# Patient Record
Sex: Female | Born: 1960
Health system: Southern US, Community
[De-identification: ages and names within clinical notes are randomized; demographics above are authoritative.]

## PROBLEM LIST (undated history)

## (undated) DIAGNOSIS — A150 Tuberculosis of lung: Secondary | ICD-10-CM

## (undated) DIAGNOSIS — R9389 Abnormal findings on diagnostic imaging of other specified body structures: Secondary | ICD-10-CM

## (undated) DIAGNOSIS — M5126 Other intervertebral disc displacement, lumbar region: Secondary | ICD-10-CM

## (undated) DIAGNOSIS — M810 Age-related osteoporosis without current pathological fracture: Secondary | ICD-10-CM

## (undated) DIAGNOSIS — K759 Inflammatory liver disease, unspecified: Secondary | ICD-10-CM

## (undated) DIAGNOSIS — G629 Polyneuropathy, unspecified: Secondary | ICD-10-CM

## (undated) DIAGNOSIS — N189 Chronic kidney disease, unspecified: Secondary | ICD-10-CM

## (undated) DIAGNOSIS — J40 Bronchitis, not specified as acute or chronic: Secondary | ICD-10-CM

## (undated) DIAGNOSIS — M5430 Sciatica, unspecified side: Secondary | ICD-10-CM

## (undated) DIAGNOSIS — B20 Human immunodeficiency virus [HIV] disease: Secondary | ICD-10-CM

## (undated) DIAGNOSIS — D071 Carcinoma in situ of vulva: Secondary | ICD-10-CM

## (undated) DIAGNOSIS — Z87448 Personal history of other diseases of urinary system: Secondary | ICD-10-CM

## (undated) DIAGNOSIS — I10 Essential (primary) hypertension: Secondary | ICD-10-CM

## (undated) DIAGNOSIS — K922 Gastrointestinal hemorrhage, unspecified: Secondary | ICD-10-CM

## (undated) DIAGNOSIS — IMO0002 Reserved for concepts with insufficient information to code with codable children: Secondary | ICD-10-CM

## (undated) DIAGNOSIS — F1910 Other psychoactive substance abuse, uncomplicated: Secondary | ICD-10-CM

## (undated) DIAGNOSIS — C4492 Squamous cell carcinoma of skin, unspecified: Secondary | ICD-10-CM

## (undated) DIAGNOSIS — M1990 Unspecified osteoarthritis, unspecified site: Secondary | ICD-10-CM

## (undated) HISTORY — DX: Reserved for concepts with insufficient information to code with codable children: IMO0002

## (undated) HISTORY — DX: Age-related osteoporosis without current pathological fracture: M81.0

## (undated) HISTORY — DX: Squamous cell carcinoma of skin, unspecified: C44.92

## (undated) HISTORY — DX: Gastrointestinal hemorrhage, unspecified: K92.2

## (undated) HISTORY — DX: Abnormal findings on diagnostic imaging of other specified body structures: R93.89

## (undated) HISTORY — DX: Tuberculosis of lung: A15.0

## (undated) HISTORY — PX: CHOLECYSTECTOMY: SHX55

## (undated) HISTORY — DX: Other psychoactive substance abuse, uncomplicated: F19.10

## (undated) HISTORY — PX: APPENDECTOMY: SHX54

## (undated) HISTORY — PX: BREAST EXCISIONAL BIOPSY: SUR124

---
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
## 1983-03-10 DIAGNOSIS — B20 Human immunodeficiency virus [HIV] disease: Secondary | ICD-10-CM

## 1983-03-10 DIAGNOSIS — Z21 Asymptomatic human immunodeficiency virus [HIV] infection status: Secondary | ICD-10-CM

## 1983-03-10 HISTORY — DX: Human immunodeficiency virus (HIV) disease: B20

## 1983-03-10 HISTORY — PX: ECTOPIC PREGNANCY SURGERY: SHX613

## 1983-03-10 HISTORY — DX: Asymptomatic human immunodeficiency virus (hiv) infection status: Z21

## 1988-03-09 HISTORY — PX: SALIVARY GLAND SURGERY: SHX768

## 1991-03-10 DIAGNOSIS — A150 Tuberculosis of lung: Secondary | ICD-10-CM

## 1991-03-10 HISTORY — DX: Tuberculosis of lung: A15.0

## 2008-03-09 DIAGNOSIS — M810 Age-related osteoporosis without current pathological fracture: Secondary | ICD-10-CM | POA: Insufficient documentation

## 2010-01-07 LAB — HM PAP SMEAR: HM Pap smear: NORMAL

## 2010-07-01 ENCOUNTER — Inpatient Hospital Stay (INDEPENDENT_AMBULATORY_CARE_PROVIDER_SITE_OTHER)
Admission: RE | Admit: 2010-07-01 | Discharge: 2010-07-01 | Disposition: A | Discharge status: Home / Self Care | Payer: Medicare Other | Source: Ambulatory Visit | Attending: Emergency Medicine | Admitting: Emergency Medicine

## 2010-07-01 DIAGNOSIS — N390 Urinary tract infection, site not specified: Secondary | ICD-10-CM

## 2010-07-01 LAB — POCT URINALYSIS DIP (DEVICE)
Glucose, UA: NEGATIVE mg/dL
Nitrite: POSITIVE — AB
Urobilinogen, UA: 0.2 mg/dL (ref 0.0–1.0)

## 2011-02-23 LAB — BASIC METABOLIC PANEL
Glucose: 92 mg/dL
Potassium: 4.2 mmol/L (ref 3.4–5.3)
Sodium: 136 mmol/L — AB (ref 137–147)

## 2011-02-23 LAB — LIPID PANEL
Cholesterol: 210 mg/dL — AB (ref 0–200)
HDL: 43 mg/dL (ref 35–70)
LDL Cholesterol: 128 mg/dL
Triglycerides: 193 mg/dL — AB (ref 40–160)

## 2011-02-23 LAB — HEPATIC FUNCTION PANEL: AST: 13 U/L (ref 13–35)

## 2011-02-23 LAB — CBC AND DIFFERENTIAL
HCT: 40 % (ref 36–46)
Hemoglobin: 13 g/dL (ref 12.0–16.0)

## 2011-07-10 ENCOUNTER — Telehealth (HOSPITAL_COMMUNITY): Payer: Self-pay | Admitting: *Deleted

## 2011-07-10 ENCOUNTER — Emergency Department (INDEPENDENT_AMBULATORY_CARE_PROVIDER_SITE_OTHER)
Admission: EM | Admit: 2011-07-10 | Discharge: 2011-07-10 | Disposition: A | Discharge status: Home / Self Care | Payer: Medicare Other | Source: Home / Self Care | Attending: Family Medicine | Admitting: Family Medicine

## 2011-07-10 ENCOUNTER — Encounter (HOSPITAL_COMMUNITY): Payer: Self-pay

## 2011-07-10 DIAGNOSIS — M5430 Sciatica, unspecified side: Secondary | ICD-10-CM

## 2011-07-10 DIAGNOSIS — M5432 Sciatica, left side: Secondary | ICD-10-CM

## 2011-07-10 HISTORY — DX: Human immunodeficiency virus (HIV) disease: B20

## 2011-07-10 HISTORY — DX: Sciatica, unspecified side: M54.30

## 2011-07-10 HISTORY — DX: Other intervertebral disc displacement, lumbar region: M51.26

## 2011-07-10 MED ORDER — KETOROLAC TROMETHAMINE 30 MG/ML IJ SOLN
30.0000 mg | Freq: Once | INTRAMUSCULAR | Status: AC
  Administered 2011-07-10: 30 mg via INTRAMUSCULAR

## 2011-07-10 MED ORDER — METHYLPREDNISOLONE 4 MG PO KIT: PACK | ORAL | Status: AC

## 2011-07-10 MED ORDER — KETOROLAC TROMETHAMINE 30 MG/ML IJ SOLN
INTRAMUSCULAR | Status: AC
  Filled 2011-07-10: qty 1

## 2011-07-10 MED ORDER — METHYLPREDNISOLONE ACETATE 40 MG/ML IJ SUSP
80.0000 mg | Freq: Once | INTRAMUSCULAR | Status: AC
  Administered 2011-07-10: 80 mg via INTRAMUSCULAR

## 2011-07-10 MED ORDER — METHYLPREDNISOLONE ACETATE 80 MG/ML IJ SUSP
INTRAMUSCULAR | Status: AC
Start: 2011-07-10 — End: 2011-07-10
  Filled 2011-07-10: qty 1

## 2011-07-10 MED ORDER — METAXALONE 800 MG PO TABS: 800.0000 mg | ORAL_TABLET | Freq: Three times a day (TID) | ORAL | Status: AC

## 2011-07-10 NOTE — Discharge Instructions (Signed)
Heat to back, use medicine as prescribed, see orthopedist if further problems.

## 2011-07-10 NOTE — ED Notes (Signed)
Pt. called and said she was seen this morning by Dr. Artis Flock. She said her insurance would not cover the Skelaxin and needs something for her pain.  Her insurance for her medicine is [employer] Part D. I called [facility]ist on [address] @ [phone].   He said they would cover Tizanadine  4 or 8 mg. I asked Dr. Artis Flock and he changed order to Tizanadine 4 mg TID # 21. Pharmacist notified of change. Vassie Moselle 07/10/2011

## 2011-07-10 NOTE — ED Provider Notes (Signed)
History     CSN: [id number]  Arrival date & time 07/10/11  4098   First MD Initiated Contact with Patient 07/10/11 1006      Chief Complaint  Patient presents with  . Sciatica    (Consider location/radiation/quality/duration/timing/severity/associated sxs/prior treatment) Patient is a 51 y.o. female presenting with back pain. The history is provided by the patient.  Back Pain  This is a chronic problem. The current episode started yesterday. 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 The pain is present in the lumbar spine. The quality of the pain is described as shooting. The pain radiates to the left thigh. The pain is moderate. Pertinent negatives include no abdominal pain, no bowel incontinence, no bladder incontinence and no dysuria.    Past Medical History  Diagnosis Date  . HIV (human immunodeficiency virus infection)   . Sciatica   . Ruptured lumbar disc     Past Surgical History  Procedure Date  . Cholecystectomy   . Appendectomy   . Ectopic pregnancy surgery   . Salivary gland surgery     No family history on file.  History  Substance Use Topics  . Smoking status: Never Smoker   . Smokeless tobacco: Not on file  . Alcohol Use: No    OB History    Grav Para Term Preterm Abortions TAB SAB Ect Mult Living                  Review of Systems  Constitutional: Negative.   Gastrointestinal: Negative.  Negative for abdominal pain and bowel incontinence.  Genitourinary: Negative for bladder incontinence and dysuria.  Musculoskeletal: Positive for back pain.    Allergies  Bactrim and Doxycycline  Home Medications   Current Outpatient Rx  Name Route Sig Dispense Refill  . MEPRON PO Oral Take by mouth.    . EFAVIRENZ-EMTRICITAB-TENOFOVIR 600-200-300 MG PO TABS Oral Take 1 tablet by mouth at bedtime.    Marland Kitchen NAPROXEN 500 MG PO TABS Oral Take 500 mg by mouth 2 (two) times daily with a meal.    .  METAXALONE 800 MG PO TABS Oral Take 1 tablet (800 mg total) by mouth 3 (three) times daily. As muscle relaxer. 21 tablet 0  . METHYLPREDNISOLONE 4 MG PO KIT  follow package directions, start on sat , take until finished. 21 tablet 0    BP 125/78  Pulse 60  Temp(Src) 97.9 F (36.6 C) (Oral)  Resp 16  SpO2 97%  Physical Exam  Nursing note and vitals reviewed. Constitutional: She is oriented to person, place, and time. She appears well-developed and well-nourished.  Abdominal: Soft. Bowel sounds are normal.  Musculoskeletal: She exhibits tenderness.       Back:  Neurological: She is alert and oriented to person, place, and time. She has normal reflexes.  Skin: Skin is warm and dry.    ED Course  Procedures (including critical care time)  Labs Reviewed - No data to display No results found.   1. Chronic sciatica, left       MDM          Linna Hoff, MD 07/10/11 [phone]

## 2011-07-10 NOTE — ED Notes (Signed)
C/o sciatica since yesterday.  Has pain in low back, buttocks and down both legs.  States worse on lt than rt.

## 2011-07-13 ENCOUNTER — Telehealth (HOSPITAL_COMMUNITY): Payer: Self-pay | Admitting: *Deleted

## 2011-07-13 NOTE — ED Notes (Signed)
Pt. called on VM on 5/3 @ 1547 but did not leave a message. 5/6 I called pt. back and left a message. Pt. called right back and said it was already taken cared of. It was about a Rx. Courtney Hamilton 07/13/2011

## 2011-07-21 ENCOUNTER — Ambulatory Visit (INDEPENDENT_AMBULATORY_CARE_PROVIDER_SITE_OTHER): Payer: Medicare Other | Admitting: Sports Medicine

## 2011-07-21 ENCOUNTER — Encounter: Payer: Self-pay | Admitting: Sports Medicine

## 2011-07-21 VITALS — BP 127/85 | HR 89 | Temp 97.5°F | Ht 66.0 in | Wt 155.3 lb

## 2011-07-21 DIAGNOSIS — G479 Sleep disorder, unspecified: Secondary | ICD-10-CM

## 2011-07-21 DIAGNOSIS — M549 Dorsalgia, unspecified: Secondary | ICD-10-CM | POA: Insufficient documentation

## 2011-07-21 DIAGNOSIS — O98519 Other viral diseases complicating pregnancy, unspecified trimester: Secondary | ICD-10-CM

## 2011-07-21 DIAGNOSIS — Z21 Asymptomatic human immunodeficiency virus [HIV] infection status: Secondary | ICD-10-CM

## 2011-07-21 DIAGNOSIS — M810 Age-related osteoporosis without current pathological fracture: Secondary | ICD-10-CM

## 2011-07-21 DIAGNOSIS — G8929 Other chronic pain: Secondary | ICD-10-CM | POA: Insufficient documentation

## 2011-07-21 MED ORDER — HYDROCODONE-ACETAMINOPHEN 5-325 MG PO TABS: 1.0000 | ORAL_TABLET | Freq: Three times a day (TID) | ORAL | Status: DC | PRN

## 2011-07-21 NOTE — Patient Instructions (Signed)
It was nice to meet you today.     Today we discussed:   Your HIV - I will refer you to our ID clinic in town.  We will be in touch with you regarding an appointment   Your BACK PAIN:  I have written for you to have 60 tablets available for the month.  We will not be able to write more than this for the next 30 days.  You stated you only need them on your bad days and then only up to twice a day.  We would like to refer you to pain management and physical therapy but will need to obtain records prior to this.  Please plan to return to see me in 3-4 weeks.  If you need anything prior to seeing me please call the clinic.

## 2011-07-27 NOTE — Progress Notes (Signed)
Patient ID: Paizlee Kinder, female   DOB: 1960-07-25, 51 y.o.   MRN: [id number] HPI:  Avonlea Sima is a 51 y.o. female presenting today to establish care.   Pt has recently moved to [place] from [place].  Past medical problems include chronic back pain and HIV.  She has been followed by ID and is currently on anti-retrovirals.  Per her report her CD4 count was 0, 3 years ago but has now increased to 136 since starting Atripla.   She has recently stopped smoking 1 month ago. Pt does report chronic back pain for that is requires Opioids prn. Does report disordered sleeping that is long standing; sleep maintenance issues - agreed to defer to next visit  ROS Denies chest pain, fevers, chills, cough, falls, weakness or weightloss.  HISTORY Medications Reviewed & Updated, see associated section Medical Hx Reviewed: Significant for HIV, Backpain, osteoprosis Family History Reviewed: Yes  and see associated section Social History Reviewed:  Significant for recent move to [place], quit smoking 1 month ago.  PE: GENERAL:  Adult AA female.  Examined in [facility].  In no discomfort; norespiratory distress.   PSYCH: Alert and appropriately interactive; Insight:Good   H&N: AT/[place], MMM, no scleral icterus, EOMi, no oral lesions THORAX: HEART: RRR, S1/S2 heard, no murmur LUNGS: CTA B, no wheezes, no crackles ABDOMEN:  +BS, soft, non-tender, no rigidity, no guarding, no masses/organomegaly EXTREMITIES: Moves all 4 extremities spontaneously, warm well perfused, NO edema, bilateral DP and PT pulses 2/4.   >NEURO: B LE DTRs 2+4 with strength 5/5 B LE myotomes >MSK/Osteopathic: restricted hip flexion, L anterior innominate.

## 2011-07-31 ENCOUNTER — Telehealth: Payer: Self-pay | Admitting: Sports Medicine

## 2011-07-31 NOTE — Telephone Encounter (Signed)
Patient was in about a week ago and says she discussed not sleeping well.  That problem is continuing and she is hoping for a medication to be sent in to her pharmacy.

## 2011-08-03 DIAGNOSIS — Z21 Asymptomatic human immunodeficiency virus [HIV] infection status: Secondary | ICD-10-CM | POA: Insufficient documentation

## 2011-08-03 DIAGNOSIS — G479 Sleep disorder, unspecified: Secondary | ICD-10-CM | POA: Insufficient documentation

## 2011-08-03 DIAGNOSIS — B20 Human immunodeficiency virus [HIV] disease: Secondary | ICD-10-CM | POA: Insufficient documentation

## 2011-08-05 NOTE — Telephone Encounter (Signed)
Patient is calling back and has not heard back from MD yet.  Having trouble staying asleep and waking up tired like she hasn't slept.  She is hoping for meds to help her sleep.

## 2011-08-06 ENCOUNTER — Encounter: Payer: Self-pay | Admitting: Family Medicine

## 2011-08-06 ENCOUNTER — Ambulatory Visit (INDEPENDENT_AMBULATORY_CARE_PROVIDER_SITE_OTHER): Payer: Medicare Other | Admitting: Family Medicine

## 2011-08-06 VITALS — BP 137/86 | HR 65 | Temp 97.9°F | Ht 66.0 in | Wt 157.8 lb

## 2011-08-06 DIAGNOSIS — N390 Urinary tract infection, site not specified: Secondary | ICD-10-CM

## 2011-08-06 DIAGNOSIS — R300 Dysuria: Secondary | ICD-10-CM

## 2011-08-06 HISTORY — DX: Urinary tract infection, site not specified: N39.0

## 2011-08-06 LAB — POCT URINALYSIS DIPSTICK
Nitrite, UA: POSITIVE
Protein, UA: NEGATIVE
Spec Grav, UA: 1.01
Urobilinogen, UA: 0.2

## 2011-08-06 LAB — POCT UA - MICROSCOPIC ONLY

## 2011-08-06 MED ORDER — CEFTRIAXONE SODIUM 1 G IJ SOLR
1.0000 g | Freq: Once | INTRAMUSCULAR | Status: AC
  Administered 2011-08-06: 1 g via INTRAMUSCULAR

## 2011-08-06 MED ORDER — NAPROXEN 500 MG PO TABS: 500.0000 mg | ORAL_TABLET | Freq: Two times a day (BID) | ORAL | Status: DC

## 2011-08-06 MED ORDER — TRAMADOL HCL 50 MG PO TABS: 50.0000 mg | ORAL_TABLET | Freq: Three times a day (TID) | ORAL | Status: DC | PRN

## 2011-08-06 MED ORDER — CEPHALEXIN 500 MG PO CAPS: 500.0000 mg | ORAL_CAPSULE | Freq: Two times a day (BID) | ORAL | Status: AC

## 2011-08-06 NOTE — Progress Notes (Signed)
Patient ID: Kaelea Gathright, female   DOB: February 02, 1961, 51 y.o.   MRN: [id number]   SUBJECTIVE: Lyndall Bellot is a 51 y.o. female who complains of urinary frequency, urgency and dysuria x 5 days, without flank pain, fever, chills, or abnormal vaginal discharge or bleeding.   OBJECTIVE: Appears well, in no apparent distress.  Vital signs are normal. The abdomen is soft without tenderness, guarding, mass, rebound or organomegaly. No CVA tenderness or inguinal adenopathy noted. Urine dipstick shows positive for WBC's, positive for RBC's, positive for nitrates and positive for leukocytes.  Micro exam: 2-5 WBC's per HPF, 2-5 RBC's per HPF and 3+ bacteria.   ASSESSMENT: UTI uncomplicated without evidence of pyelonephritis  PLAN: Treatment per orders - also push fluids, may use Pyridium OTC prn. Call or return to clinic prn if these symptoms worsen or fail to improve as anticipated.

## 2011-08-06 NOTE — Telephone Encounter (Signed)
Discussed lack of sleep with pt - reports issues with sleep maintenance.  This is to be discussed further at her next visit with me as it is a long standing issue.  She also reports acute resp illness that has made sleeping worse.  Encouraged her to make an appointment in the clinic to be evaluated for her acute illness.    Discussed avoidance of stimulating activities prior to bed time and suggested moving her evening walk to a morning walk as this is something new that coincides with her sleeplessness.

## 2011-08-06 NOTE — Patient Instructions (Addendum)
Good to see you. You do have a urinary tract infection. I'm giving you a shot of antibiotic today and going to give you 7 days worth of antibiotics. I also am giving you to tramadol. Should probably come back in one week to see your physician or myself again to make sure you're getting better.  Urinary Tract Infection Infections of the urinary tract can start in several places. A bladder infection (cystitis), a kidney infection (pyelonephritis), and a prostate infection (prostatitis) are different types of urinary tract infections (UTIs). They usually get better if treated with medicines (antibiotics) that kill germs. Take all the medicine until it is gone. You or your child may feel better in a few days, but TAKE ALL MEDICINE or the infection may not respond and may become more difficult to treat. HOME CARE INSTRUCTIONS   Drink enough water and fluids to keep the urine clear or pale yellow. Cranberry juice is especially recommended, in addition to large amounts of water.   Avoid caffeine, tea, and carbonated beverages. They tend to irritate the bladder.   Alcohol may irritate the prostate.   Only take over-the-counter or prescription medicines for pain, discomfort, or fever as directed by your caregiver.  To prevent further infections:  Empty the bladder often. Avoid holding urine for long periods of time.   After a bowel movement, women should cleanse from front to back. Use each tissue only once.   Empty the bladder before and after sexual intercourse.  FINDING OUT THE RESULTS OF YOUR TEST Not all test results are available during your visit. If your or your child's test results are not back during the visit, make an appointment with your caregiver to find out the results. Do not assume everything is normal if you have not heard from your caregiver or the medical facility. It is important for you to follow up on all test results. SEEK MEDICAL CARE IF:   There is back pain.   Your  baby is older than 3 months with a rectal temperature of 100.5 F (38.1 C) or higher for more than 1 day.   Your or your child's problems (symptoms) are no better in 3 days. Return sooner if you or your child is getting worse.  SEEK IMMEDIATE MEDICAL CARE IF:   There is severe back pain or lower abdominal pain.   You or your child develops chills.   You have a fever.   Your baby is older than 3 months with a rectal temperature of 102 F (38.9 C) or higher.   Your baby is 16 months old or younger with a rectal temperature of 100.4 F (38 C) or higher.   There is nausea or vomiting.   There is continued burning or discomfort with urination.  MAKE SURE YOU:   Understand these instructions.   Will watch your condition.   Will get help right away if you are not doing well or get worse.  Document Released: 12/03/2004 Document Revised: 02/12/2011 Document Reviewed: 07/08/2006 [facility] Patient Information 2012 [place], [place].

## 2011-08-06 NOTE — Assessment & Plan Note (Signed)
Patient has positive leukocyte esterase, nitrates, and blood. Patient clinically appears to have a UTI and potentially early pyelonephritis. We'll do ceftriaxone x1 today Keflex twice a day for the next 7 days. Patient given tramadol and naproxen for pain. Asked patient to followup the next day she declined will be following up in the next week. Patient is a red flags and when to seek medical attention.

## 2011-08-06 NOTE — Assessment & Plan Note (Addendum)
On Atripla, atovaquone and clotrimazole.  Currently on anti-retroviral regimen she was on in [place].  Will refer to ID clinic for continuation of care.

## 2011-08-06 NOTE — Assessment & Plan Note (Signed)
Reports difficulty with sleep maintenance.  Deferred further discuss to next appointment as pt indicates this was of lower priority than her back pain.

## 2011-08-09 LAB — URINE CULTURE

## 2011-08-10 ENCOUNTER — Telehealth: Payer: Self-pay

## 2011-08-10 NOTE — Telephone Encounter (Signed)
Left message to call for new patient intake.  I will also need proof of positivity prior to visit.    Laurell Josephs, RN

## 2011-08-11 ENCOUNTER — Encounter: Payer: Self-pay | Admitting: Sports Medicine

## 2011-08-11 ENCOUNTER — Telehealth: Payer: Self-pay | Admitting: *Deleted

## 2011-08-11 NOTE — Assessment & Plan Note (Signed)
Will await records

## 2011-08-11 NOTE — Assessment & Plan Note (Addendum)
Pt provided prn norco.  Awaiting further records from [place].  Pt will likely benefit from PT.  If chronic pain meds required will refer to Pain Management

## 2011-08-11 NOTE — Telephone Encounter (Signed)
She left a message on voicemail that she was returning our call. When I called her there was no answer & she did not have a machine kick in to leave a message for her. To Tomasita Morrow, RN

## 2011-08-11 NOTE — Assessment & Plan Note (Signed)
>>  ASSESSMENT AND PLAN FOR BACK PAIN WRITTEN ON 08/11/2011 12:36 AM BY RIGBY, Davy Westmoreland D, DO  Pt provided prn norco.  Awaiting further records from [place].  Pt will likely benefit from PT.  If chronic pain meds required will refer to Pain Management

## 2011-08-19 ENCOUNTER — Telehealth: Payer: Self-pay | Admitting: Sports Medicine

## 2011-08-19 NOTE — Telephone Encounter (Signed)
Pt has appt on 7/11 and is needing refill on her hydrocodone until appt. [facility] [address]

## 2011-08-19 NOTE — Telephone Encounter (Signed)
Will route request to Dr. Cherlynn June clinic this afternoon.  Gaylene Brooks, RN

## 2011-08-20 NOTE — Telephone Encounter (Signed)
Pt will need to be seen in clinic prior to refill on pain medications.  It was made clear that she needed to return to clinic prior to running out of meds to reasses her on going issues.  I realize my clinics are full but she will need to be seen by another provider on the [facility] team or wait until her appointment with me.

## 2011-08-21 NOTE — Telephone Encounter (Signed)
Called patient and left message to call our office back.  Kitrina Maurin Ann, RN  

## 2011-08-21 NOTE — Telephone Encounter (Signed)
Patient called back.  Informed of info from Dr. Marline Backbone office visit before med can be refilled.  Patient is going out of town and unable to be seen sooner.  Will follow-up with Dr. Berline Chough at appt on 09/17/11.  Gaylene Brooks, RN

## 2011-09-07 ENCOUNTER — Telehealth: Payer: Self-pay

## 2011-09-07 NOTE — Telephone Encounter (Signed)
Pt left voicemail message last Friday, stating she is out of town and will return on September 17, 2011 and would like to schedule appointment on that date.  Return call made to patient today. No available appointments on September 17, 2011. Next available after the 40 th is September 21, 2011 . Advised pt to call back to schedule appointment and will also need release signed proving HIV status since we do not have any labs in our system. Pt was referred by [facility] physician.   Laurell Josephs, RN

## 2011-09-17 ENCOUNTER — Ambulatory Visit (INDEPENDENT_AMBULATORY_CARE_PROVIDER_SITE_OTHER): Payer: Medicare Other | Admitting: Sports Medicine

## 2011-09-17 ENCOUNTER — Encounter: Payer: Self-pay | Admitting: Sports Medicine

## 2011-09-17 VITALS — BP 110/76 | HR 78 | Temp 98.6°F | Ht 66.0 in | Wt 159.0 lb

## 2011-09-17 DIAGNOSIS — M549 Dorsalgia, unspecified: Secondary | ICD-10-CM

## 2011-09-17 DIAGNOSIS — G5701 Lesion of sciatic nerve, right lower limb: Secondary | ICD-10-CM | POA: Insufficient documentation

## 2011-09-17 DIAGNOSIS — G5700 Lesion of sciatic nerve, unspecified lower limb: Secondary | ICD-10-CM

## 2011-09-17 DIAGNOSIS — Z21 Asymptomatic human immunodeficiency virus [HIV] infection status: Secondary | ICD-10-CM

## 2011-09-17 MED ORDER — HYDROCODONE-ACETAMINOPHEN 5-325 MG PO TABS: 1.0000 | ORAL_TABLET | Freq: Three times a day (TID) | ORAL | Status: DC | PRN

## 2011-09-17 MED ORDER — NORTRIPTYLINE HCL 25 MG PO CAPS: 25.0000 mg | ORAL_CAPSULE | Freq: Every day | ORAL | Status: DC

## 2011-09-17 MED ORDER — BACLOFEN 10 MG PO TABS: 10.0000 mg | ORAL_TABLET | Freq: Three times a day (TID) | ORAL | Status: DC | PRN

## 2011-09-17 NOTE — Assessment & Plan Note (Signed)
Patient reports worsening of right gluteal pain since her travel to [place] recently.  Instructed on exercises as well as heat and ice  Tennis ball technique reviewed patient with hesitancy towards this modality.  Patient provided with muscle relaxer.

## 2011-09-17 NOTE — Progress Notes (Signed)
  [facility]  Patient name: Geovanna Simko MRN [id number]  Date of birth: July 04, 1960  CC & HPI  Mahiya Spearman is a 51 y.o. female presenting today for follow-up of Low back pain  Has not been able to establish with HIV clinic.  Has been taking her medications and has plenty at this time.  Has been out of pain meds since 30 days after fill of 60 pills.  Reports a significant difference in her pain control and activities of daily living  Patient does report a new gluteal pain on right.  This has started over the past 2 weeks.  Tightness and burning, Nonradiating.  Worsened after her trainer at home for [place] recently  Chronic Pain Follow-Up Assessment Chronic Pain Diagnosis: Chronic Low Back pain  Pain Scale rating (0-10) in PAST 24 Hours: BEST 3/10  WORST 10/10  Using Likert scale (0 = Does not interfere; 10 - completely interferes) describe how - during the last 24 hours - pain has interfered with pt's: General Activity 5/10  Mood 0/10  Ability to work (in or out of home) 10/10 given difficulty to maintain job due to lability of her pain severity  Interactions with other people 4/10  Sleep 0/10  Enjoyment of Life 7/10   ROS  No falls, no fevers, no chills, no dysuria  Pertinent History Reviewed  Medical & Surgical Hx:  Reviewed: Significant for HIV, chronic DDD Medications: Reviewed & Updated - see associated section Social History: Reviewed - Significant for recently quit smoking prior to my last visit with her reports she has had one one cigarette during this time when she returned to [place] which had former triggers  Objective Findings  Vitals:  Filed Vitals:   09/17/11 1340  BP: 110/76  Pulse: 78  Temp: 98.6 F (37 C)   PE: GENERAL:  Adult African American female.  Examined in [facility].  In no discomfort; norespiratory distress.   PSYCH: Alert and appropriately interactive; Insight:Good   H&N: AT/[place], MMM, no scleral icterus,  EOMi THORAX: HEART: RRR, S1/S2 heard, no murmur LUNGS: CTA B, no wheezes, no crackles EXTREMITIES: Moves all 4 extremities spontaneously, warm well perfused, no edema, bilateral DP and PT pulses 2/4.   Seated Flexion/ASIS CompressionTest: right Leg Length screening: neutral LUMBAR  UP and LP L5 TP with L5 RRSR  PELVIS  Piriformis TP  Neutral pelvis    A/P  To to see Lazarus Gowda for Medicare yearly visit.

## 2011-09-17 NOTE — Patient Instructions (Addendum)
It was good to see you today.  I have started 2 new medicines and refilled your Norco for your pain.  Baclofen is a muscle relaxer to help with your pain in your hip.  Nortriptyline is a medicine to help modulate your pain.  Please follow up in 30 days so we can further discuss your pain management  Please call the ID doctors office to schedule your follow up ASAP.  Please schedule with Berdie Ogren for your yearly Medicare review./

## 2011-09-17 NOTE — Assessment & Plan Note (Signed)
>>  ASSESSMENT AND PLAN FOR BACK PAIN WRITTEN ON 09/17/2011  9:13 PM BY RIGBY, Christen Wardrop D, DO  Patient reports prior relies on pain medicine for her chronic back pain.  Discussed other treatment options with her including starting muscle relaxer as well as nortriptyline for pain modulation.  Patient provided a prescription for Norco when necessary and discussed the appropriateness of opioid use in chronic pain conditions.  A pain contract reviewed and signed with patient today.

## 2011-09-17 NOTE — Assessment & Plan Note (Signed)
Patient reports prior relies on pain medicine for her chronic back pain.  Discussed other treatment options with her including starting muscle relaxer as well as nortriptyline for pain modulation.  Patient provided a prescription for Norco when necessary and discussed the appropriateness of opioid use in chronic pain conditions.  A pain contract reviewed and signed with patient today.

## 2011-09-17 NOTE — Assessment & Plan Note (Addendum)
has been unable to establish with HIV clinic.  Have asked for release of information to try to obtain from her prior physician in [place] again.  If the pain was have scanned in to media section so that ID and [facility] has availability to this information.

## 2011-10-01 ENCOUNTER — Ambulatory Visit (INDEPENDENT_AMBULATORY_CARE_PROVIDER_SITE_OTHER): Payer: Medicare Other

## 2011-10-01 ENCOUNTER — Other Ambulatory Visit (HOSPITAL_COMMUNITY)
Admission: RE | Admit: 2011-10-01 | Discharge: 2011-10-01 | Disposition: A | Discharge status: Home / Self Care | Payer: Medicare Other | Source: Ambulatory Visit | Attending: Internal Medicine | Admitting: Internal Medicine

## 2011-10-01 DIAGNOSIS — Z113 Encounter for screening for infections with a predominantly sexual mode of transmission: Secondary | ICD-10-CM | POA: Insufficient documentation

## 2011-10-01 DIAGNOSIS — B20 Human immunodeficiency virus [HIV] disease: Secondary | ICD-10-CM

## 2011-10-01 DIAGNOSIS — B009 Herpesviral infection, unspecified: Secondary | ICD-10-CM

## 2011-10-01 DIAGNOSIS — G629 Polyneuropathy, unspecified: Secondary | ICD-10-CM

## 2011-10-01 DIAGNOSIS — Z201 Contact with and (suspected) exposure to tuberculosis: Secondary | ICD-10-CM | POA: Insufficient documentation

## 2011-10-01 DIAGNOSIS — Z79899 Other long term (current) drug therapy: Secondary | ICD-10-CM

## 2011-10-01 DIAGNOSIS — B370 Candidal stomatitis: Secondary | ICD-10-CM

## 2011-10-01 DIAGNOSIS — E7800 Pure hypercholesterolemia, unspecified: Secondary | ICD-10-CM

## 2011-10-01 DIAGNOSIS — IMO0002 Reserved for concepts with insufficient information to code with codable children: Secondary | ICD-10-CM

## 2011-10-01 LAB — CBC WITH DIFFERENTIAL/PLATELET
Eosinophils Relative: 5 % (ref 0–5)
Hemoglobin: 13.1 g/dL (ref 12.0–15.0)
Lymphocytes Relative: 40 % (ref 12–46)
Lymphs Abs: 2.6 10*3/uL (ref 0.7–4.0)
MCV: 88.6 fL (ref 78.0–100.0)
Platelets: 294 10*3/uL (ref 150–400)
RBC: 4.49 MIL/uL (ref 3.87–5.11)
WBC: 6.3 10*3/uL (ref 4.0–10.5)

## 2011-10-01 MED ORDER — FLUCONAZOLE 100 MG PO TABS: 100.0000 mg | ORAL_TABLET | Freq: Every day | ORAL | Status: DC

## 2011-10-01 MED ORDER — EFAVIRENZ-EMTRICITAB-TENOFOVIR 600-200-300 MG PO TABS: 1.0000 | ORAL_TABLET | Freq: Every day | ORAL | Status: DC

## 2011-10-02 LAB — COMPLETE METABOLIC PANEL WITH GFR
ALT: 13 U/L (ref 0–35)
CO2: 29 mEq/L (ref 19–32)
Calcium: 9.7 mg/dL (ref 8.4–10.5)
Chloride: 103 mEq/L (ref 96–112)
Creat: 0.9 mg/dL (ref 0.50–1.10)
GFR, Est African American: 84 mL/min
Sodium: 139 mEq/L (ref 135–145)
Total Protein: 7.6 g/dL (ref 6.0–8.3)

## 2011-10-02 LAB — URINALYSIS
Nitrite: NEGATIVE
Specific Gravity, Urine: 1.022 (ref 1.005–1.030)
pH: 6 (ref 5.0–8.0)

## 2011-10-02 LAB — HEPATITIS A ANTIBODY, TOTAL: Hep A Total Ab: NEGATIVE

## 2011-10-02 LAB — LIPID PANEL
HDL: 54 mg/dL (ref >39)
Triglycerides: 178 mg/dL — ABNORMAL HIGH (ref <150)

## 2011-10-02 LAB — T-HELPER CELL (CD4) - (RCID CLINIC ONLY): CD4 T Cell Abs: 220 uL — ABNORMAL LOW (ref 400–2700)

## 2011-10-05 LAB — TB SKIN TEST

## 2011-10-06 LAB — HIV RNA, QUANTITATIVE, PCR: HIV 1 RNA Quant: <20

## 2011-10-07 DIAGNOSIS — IMO0002 Reserved for concepts with insufficient information to code with codable children: Secondary | ICD-10-CM

## 2011-10-07 DIAGNOSIS — G629 Polyneuropathy, unspecified: Secondary | ICD-10-CM | POA: Insufficient documentation

## 2011-10-07 DIAGNOSIS — E7800 Pure hypercholesterolemia, unspecified: Secondary | ICD-10-CM | POA: Insufficient documentation

## 2011-10-07 DIAGNOSIS — B009 Herpesviral infection, unspecified: Secondary | ICD-10-CM | POA: Insufficient documentation

## 2011-10-07 HISTORY — DX: Reserved for concepts with insufficient information to code with codable children: IMO0002

## 2011-10-16 ENCOUNTER — Encounter: Payer: Self-pay | Admitting: Sports Medicine

## 2011-10-16 ENCOUNTER — Ambulatory Visit (INDEPENDENT_AMBULATORY_CARE_PROVIDER_SITE_OTHER): Payer: Medicare Other | Admitting: Sports Medicine

## 2011-10-16 VITALS — BP 116/79 | HR 75 | Wt 166.0 lb

## 2011-10-16 DIAGNOSIS — M549 Dorsalgia, unspecified: Secondary | ICD-10-CM

## 2011-10-16 DIAGNOSIS — L049 Acute lymphadenitis, unspecified: Secondary | ICD-10-CM

## 2011-10-16 DIAGNOSIS — Z21 Asymptomatic human immunodeficiency virus [HIV] infection status: Secondary | ICD-10-CM

## 2011-10-16 DIAGNOSIS — G479 Sleep disorder, unspecified: Secondary | ICD-10-CM

## 2011-10-16 MED ORDER — HYDROCODONE-ACETAMINOPHEN 5-325 MG PO TABS: 1.0000 | ORAL_TABLET | Freq: Three times a day (TID) | ORAL | Status: DC | PRN

## 2011-10-16 MED ORDER — CYCLOBENZAPRINE HCL 10 MG PO TABS: 10.0000 mg | ORAL_TABLET | Freq: Three times a day (TID) | ORAL | Status: AC | PRN

## 2011-10-16 MED ORDER — NORTRIPTYLINE HCL 50 MG PO CAPS: 50.0000 mg | ORAL_CAPSULE | Freq: Every day | ORAL | Status: DC

## 2011-10-16 NOTE — Progress Notes (Signed)
  [facility]  Patient name: Geisha Abernathy MRN [id number]  Date of birth: March 05, 1961  CC & HPI:  Jaymi Tinner is a 51 y.o. female presenting today for follow-up of chronic lower back pain and cold like symptoms.  Reports pain is improved on pain medications.  Is currently out of meds.  Baclofen was stopped because of numbness in lipis  ROS:  + sore throat.  Worsening over past 2 weeks.  Increased pain with swallowing.    Pertinent History Reviewed:  Medical & Surgical Hx:  Reviewed: Significant for HIV  Medications: Reviewed & Updated - see associated section Social History: Reviewed - Significant for hx of cocaine and tobacco abuse  Objective Findings:  Vitals:  Filed Vitals:   10/16/11 1354  BP: 116/79  Pulse: 75    GENERAL:  Adult African American female.  Examined in [facility]  In no discomfort; norespiratory distress.   PSYCH: Alert and appropriately interactive; Insight:Good   H&N: AT/[place], MMM, no scleral icterus, EOMi, significant anterior cervical chain Lymphadenopathy L>R.  B Tonsils 3+, no nasal exudate or sinus tenderness/bogginess THORAX: HEART: RRR, S1/S2 heard, no murmur LUNGS: CTA B, no wheezes, no crackles EXTREMITIES: Moves all 4 extremities spontaneously, warm well perfused, no edema, bilateral DP and PT pulses 2/4. Marland Kitchen  Assessment & Plan:

## 2011-10-16 NOTE — Patient Instructions (Addendum)
Please follow up in 1 month.  Please see the ID doctors on Monday to discuss your enlarged lymph nodes with them further.  Here are some basic nutrition rules to remember:  "Eat Real Foods & Drink Real Drinks" - if you think it was made in a factory . . it is likely best to avoid it as a staple in your diet.  Limiting these types of foods to 1-2 times per week is a good idea.  Sticking with fresh fruits and vegetables as well as home cooked meals will typically provide more nutrition and less salt than prepackaged meals.     Limit the amount of sugar sweetened and artificially sweetened foods and beverages.  Sticking with water flavored with a slice of lemon, lime or orange is a great option if you want something with flavor in it.  Using flavored seltzer water to flavor plain water will also add some bite if you want something more than flavor.     Here are 2 of my favorite web sites that provide great nutrition and exercise advice.   www.eats[place]movemore[place].com www.choosemyplate.gov

## 2011-10-19 ENCOUNTER — Encounter: Payer: Self-pay | Admitting: Internal Medicine

## 2011-10-19 ENCOUNTER — Ambulatory Visit (INDEPENDENT_AMBULATORY_CARE_PROVIDER_SITE_OTHER): Payer: Medicare Other | Admitting: Internal Medicine

## 2011-10-19 VITALS — BP 116/77 | HR 80 | Temp 98.4°F | Wt 168.0 lb

## 2011-10-19 DIAGNOSIS — B20 Human immunodeficiency virus [HIV] disease: Secondary | ICD-10-CM

## 2011-10-19 DIAGNOSIS — Z23 Encounter for immunization: Secondary | ICD-10-CM

## 2011-10-25 ENCOUNTER — Encounter: Payer: Self-pay | Admitting: Sports Medicine

## 2011-10-25 DIAGNOSIS — L049 Acute lymphadenitis, unspecified: Secondary | ICD-10-CM | POA: Insufficient documentation

## 2011-10-25 NOTE — Assessment & Plan Note (Signed)
Pt to follow back up with HIV clinic if lymphadenitis not improved over the next 1-2 weeks.  Concern for lymphoproliferative disorder but currently likely due to acute viral illness as no focal signs of infection.  Red flags of infection reviewed with pt

## 2011-10-25 NOTE — Assessment & Plan Note (Signed)
>>  ASSESSMENT AND PLAN FOR BACK PAIN WRITTEN ON 10/25/2011  9:31 PM BY RIGBY, Isayah Ignasiak D, DO  Continue moderate rx for chronic pain that was initiated by pain clinic in [place]  Switch to flexeril from baclofen Nortriptyline for pain modulation

## 2011-10-25 NOTE — Assessment & Plan Note (Addendum)
Continue moderate rx for chronic pain that was initiated by pain clinic in [place]  Switch to flexeril from baclofen Nortriptyline for pain modulation

## 2011-10-25 NOTE — Assessment & Plan Note (Signed)
Will start nortriptyline for sleep and pain modulation

## 2011-12-02 ENCOUNTER — Ambulatory Visit (INDEPENDENT_AMBULATORY_CARE_PROVIDER_SITE_OTHER): Payer: Medicare Other | Admitting: Sports Medicine

## 2011-12-02 ENCOUNTER — Other Ambulatory Visit: Payer: Self-pay | Admitting: *Deleted

## 2011-12-02 ENCOUNTER — Encounter: Payer: Self-pay | Admitting: Sports Medicine

## 2011-12-02 VITALS — BP 118/73 | HR 78 | Temp 98.1°F | Ht 66.0 in | Wt 176.0 lb

## 2011-12-02 DIAGNOSIS — L853 Xerosis cutis: Secondary | ICD-10-CM

## 2011-12-02 DIAGNOSIS — M549 Dorsalgia, unspecified: Secondary | ICD-10-CM

## 2011-12-02 DIAGNOSIS — Z23 Encounter for immunization: Secondary | ICD-10-CM

## 2011-12-02 DIAGNOSIS — IMO0001 Reserved for inherently not codable concepts without codable children: Secondary | ICD-10-CM

## 2011-12-02 DIAGNOSIS — K219 Gastro-esophageal reflux disease without esophagitis: Secondary | ICD-10-CM

## 2011-12-02 DIAGNOSIS — Z21 Asymptomatic human immunodeficiency virus [HIV] infection status: Secondary | ICD-10-CM

## 2011-12-02 DIAGNOSIS — B20 Human immunodeficiency virus [HIV] disease: Secondary | ICD-10-CM

## 2011-12-02 DIAGNOSIS — L738 Other specified follicular disorders: Secondary | ICD-10-CM

## 2011-12-02 MED ORDER — EFAVIRENZ-EMTRICITAB-TENOFOVIR 600-200-300 MG PO TABS: 1.0000 | ORAL_TABLET | Freq: Every day | ORAL | Status: DC

## 2011-12-02 MED ORDER — OMEPRAZOLE 40 MG PO CPDR: 40.0000 mg | DELAYED_RELEASE_CAPSULE | Freq: Every day | ORAL | Status: DC

## 2011-12-02 MED ORDER — HYDROCODONE-ACETAMINOPHEN 5-325 MG PO TABS: 1.0000 | ORAL_TABLET | Freq: Three times a day (TID) | ORAL | Status: DC | PRN

## 2011-12-02 NOTE — Patient Instructions (Addendum)
It was good to see you today.  Make an appointment for Osteopathic Manipulation Next month if you are interested. Remember to stretch and to do the Hula-Hoop exercises that I showed you.  Use a good hand cream such as Eucerin [place]  Diet for GERD or PUD Nutrition therapy can help ease the discomfort of gastroesophageal reflux disease (GERD) and peptic ulcer disease (PUD).  HOME CARE INSTRUCTIONS   Eat your meals slowly, in a relaxed setting.   Eat 5 to 6 small meals per day.   If a food causes distress, stop eating it for a period of time.  FOODS TO AVOID  Coffee, regular or decaffeinated.   Cola beverages, regular or low calorie.   Tea, regular or decaffeinated.   Pepper.   Cocoa.   High fat foods, including meats.   Butter, margarine, hydrogenated oil (trans fats).   Peppermint or spearmint (if you have GERD).   Fruits and vegetables if not tolerated.   Alcohol.   Nicotine (smoking or chewing). This is one of the most potent stimulants to acid production in the gastrointestinal tract.   Any food that seems to aggravate your condition.  If you have questions regarding your diet, ask your caregiver or a registered dietitian. TIPS  Lying flat may make symptoms worse. Keep the head of your bed raised 6 to 9 inches (15 to 23 cm) by using a foam wedge or blocks under the legs of the bed.   Do not lay down until 3 hours after eating a meal.   Daily physical activity may help reduce symptoms.  MAKE SURE YOU:   Understand these instructions.   Will watch your condition.   Will get help right away if you are not doing well or get worse.  Document Released: 02/23/2005 Document Revised: 02/12/2011 Document Reviewed: 01/09/2011 [facility] Patient Information 2012 [place], [place].

## 2011-12-06 NOTE — Progress Notes (Signed)
HIV CLINIC NOTE   RFV: establishing care, transferring from [place] Subjective:    Patient ID: Courtney Hamilton, female    DOB: Dec 22, 1960, 51 y.o.   MRN: [id number]  HPI Henlee is a 51yo Female with HIV disease complicated by eso candidiasis, peripheral neuropathy, dx in 1985. CD 4 count 220 (9%)/ VL < 20, on Atripla with good adherence. RF for HIV, former substance abuse, hx of blood tsf in 1982?Marland Kitchen Last seen by [place] group in January 2013, CD4 count 138, VL <20 on Atripla.  Imms/health maintenance: Pneumovax 05/2008 tdap 05/2007 Flu 11/2010 HAV negative HBsAg negative HBsAb negative HBcAb negative HCV negative RPR nonreactive 11/11 mammo, normal 11/11 dexa-osteoporosis 11/11 colonoscopy- benign polyps  Current Outpatient Prescriptions on File Prior to Visit  Medication Sig Dispense Refill  . Atovaquone (MEPRON PO) Take by mouth.      . clotrimazole (MYCELEX) 10 MG troche       . efavirenz-emtricitabine-tenofovir (ATRIPLA) 600-200-300 MG per tablet Take 1 tablet by mouth at bedtime.  30 tablet  3  . HYDROcodone-acetaminophen (NORCO) 5-325 MG per tablet Take 1 tablet by mouth every 8 (eight) hours as needed for pain.  60 tablet  0  . omeprazole (PRILOSEC) 40 MG capsule Take 1 capsule (40 mg total) by mouth daily.  30 capsule  3   Active Ambulatory Problems    Diagnosis Date Noted  . Back pain 07/21/2011  . HIV positive 08/03/2011  . Sleep disorder 08/03/2011  . Osteoporosis 03/09/2008  . Recurrent UTI 08/06/2011  . Piriformis syndrome of right side 09/17/2011  . History of tuberculosis exposure 10/01/2011  . Thrush, oral 10/01/2011  . Herpes simplex 10/07/2011  . Peripheral neuropathy 10/07/2011  . Hypercholesteremia 10/07/2011  . Lymphadenitis, acute 10/25/2011   Resolved Ambulatory Problems    Diagnosis Date Noted  . No Resolved Ambulatory Problems   Past Medical History  Diagnosis Date  . HIV (human immunodeficiency virus infection)   . Sciatica   . Ruptured lumbar disc   .  Substance abuse   . TB (pulmonary tuberculosis) 1993   . Herniated disc 10/07/2011   History   Social History  . Marital Status: Single    Spouse Name: N/A    Number of Children: N/A  . Years of Education: N/A   Occupational History  . Not on file.   Social History Main Topics  . Smoking status: Former Smoker -- 1.0 packs/day for 30 years    Types: Cigarettes  . Smokeless tobacco: Never Used  . Alcohol Use: No  . Drug Use: No     past history of cocaine  and alcohol    . Sexually Active: No   Other Topics Concern  . Not on file   Social History Narrative  . No narrative on file   family history includes Asthma in her father; Diabetes in her brother; and Hypertension in her brother. Review of Systems Constitutional: Negative for fever, chills, diaphoresis, activity change, appetite change, fatigue and unexpected weight change.  HENT: Negative for congestion, sore throat, rhinorrhea, sneezing, trouble swallowing and sinus pressure.  Eyes: Negative for photophobia and visual disturbance.  Respiratory: Negative for cough, chest tightness, shortness of breath, wheezing and stridor.  Cardiovascular: Negative for chest pain, palpitations and leg swelling.  Gastrointestinal: Negative for nausea, vomiting, abdominal pain, diarrhea, constipation, blood in stool, abdominal distention and anal bleeding.  Genitourinary: Negative for dysuria, hematuria, flank pain and difficulty urinating.  Musculoskeletal: Negative for myalgias, back pain, joint swelling, arthralgias and gait  problem.  Skin: Negative for color change, pallor, rash and wound.  Neurological: Negative for dizziness, tremors, weakness and light-headedness.  Hematological: Negative for adenopathy. Does not bruise/bleed easily.  Psychiatric/Behavioral: Negative for behavioral problems, confusion, sleep disturbance, dysphoric mood, decreased concentration and agitation.       Objective:   Physical Exam BP 116/77  Pulse  80  Temp 98.4 F (36.9 C) (Oral)  Wt 168 lb (76.204 kg) Physical Exam  Constitutional:  oriented to person, place, and time.  appears well-developed and well-nourished. No distress.  HENT:  Mouth/Throat: Oropharynx is clear and moist. No oropharyngeal exudate.  Cardiovascular: Normal rate, regular rhythm and normal heart sounds. Exam reveals no gallop and no friction rub. no murmur heard.  Pulmonary/Chest: Effort normal and breath sounds normal. No respiratory distress. He has no wheezes.  Abdominal: Soft. Bowel sounds are normal.  exhibits no distension. There is no tenderness.  Lymphadenopathy:  no cervical adenopathy.  Skin: Skin is warm and dry. No rash noted. No erythema.       Assessment & Plan:  HIV = continue with Atripla  OI prophylaxis = only has 9% CD 4, will need PCP proph, will continue with mepron  Health maintenance= will need to give flu vax and start hep B series at next visit  rtc in 3 months

## 2011-12-07 ENCOUNTER — Encounter: Payer: Self-pay | Admitting: Family Medicine

## 2011-12-07 ENCOUNTER — Ambulatory Visit (INDEPENDENT_AMBULATORY_CARE_PROVIDER_SITE_OTHER): Payer: Medicare Other | Admitting: Family Medicine

## 2011-12-07 VITALS — BP 134/88 | HR 80 | Temp 99.0°F | Ht 66.0 in | Wt 179.3 lb

## 2011-12-07 DIAGNOSIS — M549 Dorsalgia, unspecified: Secondary | ICD-10-CM

## 2011-12-07 MED ORDER — TRAMADOL HCL 50 MG PO TABS: 50.0000 mg | ORAL_TABLET | Freq: Three times a day (TID) | ORAL | Status: DC | PRN

## 2011-12-07 MED ORDER — NAPROXEN 500 MG PO TABS: 500.0000 mg | ORAL_TABLET | Freq: Two times a day (BID) | ORAL | Status: DC

## 2011-12-07 NOTE — Assessment & Plan Note (Signed)
>>  ASSESSMENT AND PLAN FOR BACK PAIN WRITTEN ON 12/07/2011 12:06 PM BY KONKOL, JILL N, MD  Acute on chronic back pain, likely related to musculoskeletal strain versus exacerbation of SI joint arthritis after starting new exercise routine. No red flags currently. Patient already taking Flexeril for muscle relaxant, Vicodin chronically. Rx for NSAID therapy short-term naproxen, tramadol when necessary. We discussed possibly starting Neurontin, patient wishes to postpone this for now. Recommend continuation of stretching and activities as tolerated. Advised patient to followup in one to 2 weeks, imaging may be considered giving the long-standing and recurrent nature of the problem, though I am unsure of the extent of radiology performed in [place], patient states records were requested and her PCP may have them. Will defer imaging until followup visit if indicated.

## 2011-12-07 NOTE — Assessment & Plan Note (Signed)
Acute on chronic back pain, likely related to musculoskeletal strain versus exacerbation of SI joint arthritis after starting new exercise routine. No red flags currently. Patient already taking Flexeril for muscle relaxant, Vicodin chronically. Rx for NSAID therapy short-term naproxen, tramadol when necessary. We discussed possibly starting Neurontin, patient wishes to postpone this for now. Recommend continuation of stretching and activities as tolerated. Advised patient to followup in one to 2 weeks, imaging may be considered giving the long-standing and recurrent nature of the problem, though I am unsure of the extent of radiology performed in [place], patient states records were requested and her PCP may have them. Will defer imaging until followup visit if indicated.

## 2011-12-07 NOTE — Patient Instructions (Addendum)
Nice to meet you. I think your back pain is from a flare of sacroiliac joint (SI). Lets add an anti-inflammatory medicine. Naproxen. You can also add acetaminophen (tylenol) 650 mg 2 x day. Keep doing chores and stretches as best you can. Make appointment with Dr. Berline Chough who knows your back in 1 week. You can also try tramadol as needed.   Back Pain, Adult Back pain is very common. The pain often gets better over time. The cause of back pain is usually not dangerous. Most people can learn to manage their back pain on their own.  HOME CARE   Stay active. Start with short walks on flat ground if you can. Try to walk farther each day.   Do not sit, drive, or stand in one place for more than 30 minutes. Do not stay in bed.   Do not avoid exercise or work. Activity can help your back heal faster.   Be careful when you bend or lift an object. Bend at your knees, keep the object close to you, and do not twist.   Sleep on a firm mattress. Lie on your side, and bend your knees. If you lie on your back, put a pillow under your knees.   Only take medicines as told by your doctor.   Put ice on the injured area.   Put ice in a plastic bag.   Place a towel between your skin and the bag.   Leave the ice on for 15 to 20 minutes, 3 to 4 times a day for the first 2 to 3 days. After that, you can switch between ice and heat packs.   Ask your doctor about back exercises or massage.   Avoid feeling anxious or stressed. Find good ways to deal with stress, such as exercise.  GET HELP RIGHT AWAY IF:   Your pain does not go away with rest or medicine.   Your pain does not go away in 1 week.   You have new problems.   You do not feel well.   The pain spreads into your legs.   You cannot control when you poop (bowel movement) or pee (urinate).   Your arms or legs feel weak or lose feeling (numbness).   You feel sick to your stomach (nauseous) or throw up (vomit).   You have belly (abdominal)  pain.   You feel like you may pass out (faint).  MAKE SURE YOU:   Understand these instructions.   Will watch your condition.   Will get help right away if you are not doing well or get worse.  Document Released: 08/12/2007 Document Revised: 02/12/2011 Document Reviewed: 07/14/2010 [facility] Patient Information 2012 [place], [place].

## 2011-12-07 NOTE — Progress Notes (Signed)
  Subjective:    Patient ID: Courtney Hamilton, female    DOB: [birth year], 51 y.o.   MRN: [id number]  HPI  1. acute on chronic back pain. 51 year old female with a history of chronic back pain he presents with worsening of low right-sided back pain for 2 days. States she was seen by her PCP last week and underwent manipulation, was counseled to start new exercise therapies. Since Saturday morning, she woke up and started moving around and noticed stiffness, catching in her low right back. Worsened with standing and certain movements. Improves when she lies down. Rates as severe. This is similar to previous exacerbations of her long-standing back pain for which she is followed by Dr. Berline Chough and has previously had imaging performed in [place], records not available to review.  She is taking her regular Vicodin for pain. She has stopped nortriptyline because it worsened her anxiety.  Denies any weakness, numbness, tingling, radicular pain, dysuria, fevers, emesis.  Review of Systems See HPI otherwise negative.  reports that she has quit smoking. Her smoking use included Cigarettes. She has a 30 pack-year smoking history. She has never used smokeless tobacco.     Objective:   Physical Exam  Vitals reviewed. Constitutional: She is oriented to person, place, and time. She appears well-developed and well-nourished. No distress.  HENT:  Head: Normocephalic and atraumatic.  Mouth/Throat: Oropharynx is clear and moist.  Eyes: EOM are normal.  Neck: Neck supple.  Cardiovascular: Normal rate, regular rhythm and normal heart sounds.   Pulmonary/Chest: Effort normal and breath sounds normal. No respiratory distress. She has no wheezes. She has no rales.  Abdominal: Soft. Bowel sounds are normal.  Musculoskeletal: She exhibits no edema.       Pain with lumbar extension, decreased. Slight tender to palpation in right sacroiliac region and right lumbar paraspinal musculature. Positive right FABER test,  negative left. Negative straight leg testing. Distal extremity strength and sensation intact. Symmetric patellar and ankle reflexes.  No CVA tenderness.  Neurological: She is alert and oriented to person, place, and time. Coordination normal.  Skin: No rash noted.  Psychiatric: She has a normal mood and affect.       Assessment & Plan:

## 2011-12-10 ENCOUNTER — Ambulatory Visit (INDEPENDENT_AMBULATORY_CARE_PROVIDER_SITE_OTHER): Payer: Medicare Other | Admitting: Family Medicine

## 2011-12-10 ENCOUNTER — Telehealth: Payer: Self-pay | Admitting: Family Medicine

## 2011-12-10 VITALS — BP 135/88 | HR 71 | Ht 66.0 in | Wt 181.0 lb

## 2011-12-10 DIAGNOSIS — Z21 Asymptomatic human immunodeficiency virus [HIV] infection status: Secondary | ICD-10-CM

## 2011-12-10 DIAGNOSIS — R609 Edema, unspecified: Secondary | ICD-10-CM

## 2011-12-10 DIAGNOSIS — R600 Localized edema: Secondary | ICD-10-CM | POA: Insufficient documentation

## 2011-12-10 HISTORY — DX: Localized edema: R60.0

## 2011-12-10 LAB — POCT URINALYSIS DIPSTICK
Ketones, UA: NEGATIVE
Protein, UA: NEGATIVE
Spec Grav, UA: 1.01
Urobilinogen, UA: 0.2
pH, UA: 6

## 2011-12-10 LAB — COMPREHENSIVE METABOLIC PANEL
ALT: 15 U/L (ref 0–35)
BUN: 11 mg/dL (ref 6–23)
CO2: 29 mEq/L (ref 19–32)
Creat: 0.78 mg/dL (ref 0.50–1.10)
Glucose, Bld: 87 mg/dL (ref 70–99)
Total Bilirubin: 0.1 mg/dL — ABNORMAL LOW (ref 0.3–1.2)

## 2011-12-10 LAB — CBC
HCT: 37.7 % (ref 36.0–46.0)
MCH: 29 pg (ref 26.0–34.0)
MCHC: 32.9 g/dL (ref 30.0–36.0)
MCV: 88.3 fL (ref 78.0–100.0)
Platelets: 268 10*3/uL (ref 150–400)
RDW: 12.5 % (ref 11.5–15.5)
WBC: 8.7 10*3/uL (ref 4.0–10.5)

## 2011-12-10 LAB — URINALYSIS, MICROSCOPIC ONLY: RBC: NEGATIVE

## 2011-12-10 NOTE — Assessment & Plan Note (Signed)
Bilateral, new acute onset. DDx includes ARF (given her chronic HIV meds plus NSAID), liver disease, CHF, DVT, protein deficiency, anemia or venous stasis. I am concerned about the acuity of onset. She does not have difficulty breathing or hypertension (which would be new for her), therefore do not think she needs hospital admission at this time. No clinical sign of DVT. I would like her to get labs today and RTC tomorrow. Encouraged her to continue to drink water and keep legs elevated. UA, Cmet and CBC stat now and patient will make an appointment to be seen tomorrow in the [facility] At that time, her labs will be discussed and we will go from there. If her creat is up, I would stop her NSAIDs. Based on the number, she may need inpatient observation though. If labs look normal, she can try support hose and follow up with Dr. Berline Chough next week.

## 2011-12-10 NOTE — Telephone Encounter (Signed)
I received a call from [facility] regarding orders for Mrs. Berrett that were ordered as "stat" this afternoon. Her CBC was reported within normal limits and metabolic panel showed a decrease total bilirubin to 0.1 and Alk Phos to 145.

## 2011-12-10 NOTE — Progress Notes (Signed)
Patient ID: Jaeanna Mccomber, female   DOB: 10/17/1960, 51 y.o.   MRN: [id number] [facility] Lynnea Vandervoort M. Cordarro Spinnato, MD Phone: [phone]   Subjective: HPI: Patient is a 51 y.o. female presenting to clinic today for walk in appointment. Concerns today include ankle/foot swelling  Patient is a 51 yo F with PMH of hiv and chronic back pain who presents as a walk in for one week history of "feeling bloated", weight gain and she noticed ankle swelling yesterday. She states she has been drinking more water, not helping. Normal bowel movements. ?decreased UOP (always has low UOP but feels like it has not increased with more fluids.) She has been exercising regularly but nothing new. Was manipulated by Dr. Berline Chough on September 25 and was having worsening back pain. Started Tramadol and Naproxen this week but taken these medications before with no problems. Slept with feet elevated last night and still swollen today with no changes. No change with time of day or movement. No diet changes; no increased salt. Never had anything like this before with ankle swelling that does not go away. No shortness of breath, CP, difficulty taking a deep breath. No redness of legs. Some abdominal distention and pain in back that radiates to hip/groin but this is not new either.   History Reviewed: Every day smoker. Health Maintenance: Needs flu shot  ROS: Please see HPI above.  Objective: Office vital signs reviewed.  Physical Examination:  General: Awake, alert. NAD HEENT: Atraumatic, normocephalic Pulm: CTAB, no wheezes, no crackles.  Cardio: RRR, no murmurs appreciated Abdomen:+BS, soft, nontender, ?distended  Extremities: 1-2+ pitting edema to the mid shin. No redness, not tender Neuro: Grossly intact  Assessment: 51 yo F with new onset lower extremity edema  Plan: See Problem List and After Visit Summary

## 2011-12-10 NOTE — Patient Instructions (Addendum)
Thank you for coming in today. I feel the first step of taking care of your swelling is to check some labs. I would like you to keep your legs elevated and drink water. Please come back tomorrow to discuss your lab work and to make a plan from there.  If you have shortness of breath, chest pain or worsening swelling, please call us or go to the emergency room.  Take care! Ernesto Lashway M. Kalen Neidert, M.D.

## 2011-12-11 ENCOUNTER — Encounter: Payer: Self-pay | Admitting: Family Medicine

## 2011-12-11 ENCOUNTER — Ambulatory Visit (INDEPENDENT_AMBULATORY_CARE_PROVIDER_SITE_OTHER): Payer: Medicare Other | Admitting: Family Medicine

## 2011-12-11 VITALS — BP 126/83 | HR 72 | Temp 97.9°F | Ht 66.0 in | Wt 179.0 lb

## 2011-12-11 DIAGNOSIS — R600 Localized edema: Secondary | ICD-10-CM

## 2011-12-11 DIAGNOSIS — R609 Edema, unspecified: Secondary | ICD-10-CM

## 2011-12-11 NOTE — Patient Instructions (Signed)
It was nice to meet you.  For your stomach feeling bloated, you can try simethicone (gas-X) to see if that helps.  For the swelling in your legs, keep them elevated to help.  You can also buy some compression stockings over the counter at places like [facility] or [facility].   Come back and see Dr. Berline Chough next week.

## 2011-12-11 NOTE — Progress Notes (Signed)
S: Pt comes in today for close follow up after being seen yesterday for bloating and swelling.  Had labs done (CBC, CMET) which are all WNL. Cr is great at 0.78. Today, pt complains of continued abdominal bloating and lower ext swelling.  Feels like the LE swelling has improved, but still with abd bloating.  Has been having daily DMs without blood in stool. Stools are formed, no diarrhea.    ROS: Per HPI  History  Smoking status  . Former Smoker -- 1.0 packs/day for 30 years  . Types: Cigarettes  Smokeless tobacco  . Never Used    O:  Filed Vitals:   12/11/11 1418  BP: 126/83  Pulse: 72  Temp: 97.9 F (36.6 C)    Gen: NAD, pleasant  CV: RRR, no murmur Pulm: CTA bilat, no wheezes or crackles Abd: soft, NT, ND, +BS Ext: Warm, 1+ pitting bilateral pre-tibial edema    A/P: 51 y.o. female p/w lab f/u -See problem list -f/u in 1-2 weeks with PCP

## 2011-12-11 NOTE — Assessment & Plan Note (Signed)
Per pt report, slightly better today.  Bigger complaint is abd bloating. Labs, including Cr, WNL from yesterday. No need for further w/u at this time. Rec'ed OTC compression stockings-- if pt likes these but needs more compression, PCP can write Rx at her f/u visit. Also rec'd OTC simethicone for bloating to see if this helps. Reasons to return to ED over weekend discussed.

## 2011-12-13 DIAGNOSIS — IMO0001 Reserved for inherently not codable concepts without codable children: Secondary | ICD-10-CM | POA: Insufficient documentation

## 2011-12-13 NOTE — Assessment & Plan Note (Addendum)
Refilled pain medications. Encouraged exercise OMT performed today.  Decision today to treat with OMT was based on Physical Exam  After verbal consent patient was treated with ME, Articulatory, and HVLA techniques in Thoracic and Rib areas  Patient tolerated the procedure well with improvement in symptoms  Patient given exercises, stretches and lifestyle modifications  See medications in patient instructions if given

## 2011-12-13 NOTE — Progress Notes (Signed)
  [facility]  Patient name: Courtney Hamilton MRN [id number]  Date of birth: 03/17/1960  CC & HPI:  Courtney Hamilton is a 51 y.o. female presenting today for follow up of chronic back pain and worsening acid reflux.  Lower back pain is stable however she has been having increased mid thoracic back pain.  Radiating to back of neck.  No weakness, no numbness and tingling.  Some headaches.  Has stopped nortriptyline due to anxiety.    Reflux symptoms have returned and is requesting refill on her omeprazole that signficantly improves her symptoms  She is also reporting dry cracked hands, not using moisturizer on regular basis  ROS:  Per HPI  Pertinent History Reviewed:  Medical & Surgical Hx:  Reviewed: Significant for HIV Medications: Reviewed & Updated - see associated section Social History: Reviewed - Significant for non smoker  Objective Findings:  Vitals:  Filed Vitals:   12/02/11 1034  BP: 118/73  Pulse: 78  Temp: 98.1 F (36.7 C)    PE: GENERAL:  Adult AA female. In no discomfort; no respiratory distress. PSYCH: Alert and appropriately interactive; Insight:Good   H&N: AT/[place], trachea midline HEART: RRR, S1/S2 heard, no murmur LUNGS: CTA B, no wheezes, no crackles EXTREMITIES: Moves all 4 extremities spontaneously, warm well perfused, no edema, bilateral DP and PT pulses 2/4. SKIN: dry skin on dorsal aspect of hands, no fissues NEUROMSK:  UE Strength: symmetric, no lateralization, full strength  Spurlings compression test, negative, UE DTRs 2/4 Standing Flexion Test: right            Seated Flexion/ASIS CompressionTest: right THORACIC  T3 rotated r sidebent L  RIBS  Posterior  L Ribs 6,7   Assessment & Plan:

## 2011-12-13 NOTE — Assessment & Plan Note (Signed)
>>  ASSESSMENT AND PLAN FOR BACK PAIN WRITTEN ON 12/13/2011  9:45 PM BY RIGBY, Timarion Agcaoili D, DO  Refilled pain medications. Encouraged exercise OMT performed today.  Decision today to treat with OMT was based on Physical Exam  After verbal consent patient was treated with ME, Articulatory, and HVLA techniques in Thoracic and Rib areas  Patient tolerated the procedure well with improvement in symptoms  Patient given exercises, stretches and lifestyle modifications  See medications in patient instructions if given

## 2011-12-13 NOTE — Assessment & Plan Note (Signed)
rx for omeprazole Handout for diet for PUD

## 2011-12-13 NOTE — Assessment & Plan Note (Signed)
Followed now by ID

## 2011-12-13 NOTE — Assessment & Plan Note (Signed)
Encouraged to use Eucerin

## 2011-12-22 ENCOUNTER — Ambulatory Visit (INDEPENDENT_AMBULATORY_CARE_PROVIDER_SITE_OTHER): Payer: Medicare Other | Admitting: Sports Medicine

## 2011-12-22 ENCOUNTER — Ambulatory Visit
Admission: RE | Admit: 2011-12-22 | Discharge: 2011-12-22 | Disposition: A | Discharge status: Home / Self Care | Payer: Medicare Other | Source: Ambulatory Visit | Attending: Family Medicine | Admitting: Family Medicine

## 2011-12-22 ENCOUNTER — Encounter: Payer: Self-pay | Admitting: Sports Medicine

## 2011-12-22 VITALS — BP 106/86 | HR 82 | Temp 97.9°F | Ht 66.0 in | Wt 182.2 lb

## 2011-12-22 DIAGNOSIS — M549 Dorsalgia, unspecified: Secondary | ICD-10-CM

## 2011-12-22 DIAGNOSIS — R600 Localized edema: Secondary | ICD-10-CM

## 2011-12-22 DIAGNOSIS — R609 Edema, unspecified: Secondary | ICD-10-CM

## 2011-12-22 DIAGNOSIS — Z23 Encounter for immunization: Secondary | ICD-10-CM

## 2011-12-22 DIAGNOSIS — G479 Sleep disorder, unspecified: Secondary | ICD-10-CM

## 2011-12-22 MED ORDER — HYDROCODONE-ACETAMINOPHEN 5-325 MG PO TABS: 1.0000 | ORAL_TABLET | Freq: Three times a day (TID) | ORAL | Status: DC | PRN

## 2011-12-22 MED ORDER — GABAPENTIN 100 MG PO CAPS: 100.0000 mg | ORAL_CAPSULE | Freq: Every evening | ORAL | Status: DC | PRN

## 2011-12-22 NOTE — Patient Instructions (Addendum)
It was good to see you.  Try the neurontin at night.  You can take up to 3 tablets at night to help you sleep. Please go by the [facility] imaging center to have X-rays taken of your back I have refilled your pain medications.  Stop the naproxen and ultram  Follow up in 1 month..  Sleep is an integral part of our bodies ability to recover from our daily activities and is key to making you body perform at its maximal potential.  Establishing and maintaining a healthy sleep pattern can not only make you feel better it can help many chronic illnesses including high blood pressure, high cholesterol, obesity, chronic pain syndromes, and many others.  Some key things to remember regarding sleep are:  Establish a consistent nightly routine that you do each night before bed.  Avoid caffeine, tobacco and alcohol as all of these drugs will cause sleep disturbances.    Establish an exercise routine.  This can be as simple as walking, dancing or what every you find gets your heart rate elevated to the point you can speak in only 3-4 word sentences.  Exercise will help make falling and staying asleep easier.    If you have difficulty with sleep, reserve the bedroom for sleeping; do not watch TV, read, eat or exercise in your bed room.      - Watching TV in bed can trick your brain into thinking it is day time and will reset your internal clock.  If you are going to watch TV before bed do so outside of the bedroom.  Although it is best to avoid screens for the hour prior to bed that is difficult to do in our current technology driven society - at the very least it is imperative that you remove TV from the bed room.      - If you have a hard time falling asleep avoid showering and exercising prior to bed.

## 2011-12-26 NOTE — Progress Notes (Signed)
  [facility]  Patient name: Courtney Hamilton MRN [id number]  Date of birth: [birth year]  CC & HPI:  Courtney Hamilton is a 51 y.o. female presenting today for follow up of:  # Chronic back pain: Reports had worsening mid low back pain.  Reports her pain medicine increases her ability to function. as not been doing exercises or using ice.  No falls, no new overt weakness or leg paresthesias. # Difficulty Sleeping: difficulty with onset and maintenance.  Reports watching TV in bed #HIV: followed by ID, on Atripla.  To follow up in 3 months # LE swelling: started after last visit.  Has improved but still present. ------------------------------------------------------------------------------------------------------------------ Medication Compliance: compliant all of the time   ------------------------------------------------------------------------------------------------------------------ New Concerns:  None   ROS:  PER HPI  Pertinent History Reviewed:  Medical & Surgical Hx:  Reviewed: Significant for HIV, HLD, recentt new onset B LE edema Medications: Reviewed & Updated - see associated section Social History: Reviewed -  reports that she has quit smoking. Her smoking use included Cigarettes. She has a 30 pack-year smoking history. She has never used smokeless tobacco.   Objective Findings:  Vitals:  Filed Vitals:   12/22/11 1440  BP: 106/86  Pulse: 82  Temp: 97.9 F (36.6 C)    PE: GENERAL:  Adult AA female. In no discomfort; no respiratory distress. PSYCH: Alert and appropriately interactive; Insight:Good   H&N: AT/[place], trachea midline EENT:  MMM, no scleral icterus, EOMi HEART: RRR, S1/S2 heard, no murmur LUNGS: CTA B, no wheezes, no crackles EXTREMITIES: Moves all 4 extremities spontaneously, warm well perfused, trace pretibial edema, bilateral DP and PT pulses 2/4.   BACK:  Mid low back pain, TTP over B sacral bases    Assessment & Plan:

## 2011-12-26 NOTE — Assessment & Plan Note (Signed)
REstarted Neurontin. Referral to PT Plain film imaging today

## 2011-12-26 NOTE — Assessment & Plan Note (Signed)
Restart Neurontin qhs.  Can titrate dose up in future. Discussed sleep hygeine and provided handout, feels this is associated with pain

## 2011-12-26 NOTE — Assessment & Plan Note (Signed)
Improved.  Will stop Aleve as likely contributing given onset soon after initiating medication Continue to follow

## 2011-12-26 NOTE — Assessment & Plan Note (Signed)
>>  ASSESSMENT AND PLAN FOR BACK PAIN WRITTEN ON 12/26/2011 12:02 PM BY RIGBY, Klaire Court D, DO  REstarted Neurontin. Referral to PT Plain film imaging today

## 2011-12-30 ENCOUNTER — Other Ambulatory Visit: Payer: Medicare Other

## 2011-12-30 DIAGNOSIS — B20 Human immunodeficiency virus [HIV] disease: Secondary | ICD-10-CM

## 2011-12-30 LAB — CBC WITH DIFFERENTIAL/PLATELET
Basophils Relative: 1 % (ref 0–1)
Eosinophils Absolute: 0.5 10*3/uL (ref 0.0–0.7)
Eosinophils Relative: 7 % — ABNORMAL HIGH (ref 0–5)
MCH: 29 pg (ref 26.0–34.0)
MCHC: 32.4 g/dL (ref 30.0–36.0)
MCV: 89.4 fL (ref 78.0–100.0)
Neutrophils Relative %: 48 % (ref 43–77)
Platelets: 271 10*3/uL (ref 150–400)
RDW: 13.7 % (ref 11.5–15.5)

## 2011-12-31 LAB — COMPLETE METABOLIC PANEL WITH GFR
AST: 14 U/L (ref 0–37)
Albumin: 4.2 g/dL (ref 3.5–5.2)
BUN: 14 mg/dL (ref 6–23)
CO2: 26 mEq/L (ref 19–32)
Calcium: 9.2 mg/dL (ref 8.4–10.5)
Chloride: 107 mEq/L (ref 96–112)
Creat: 0.8 mg/dL (ref 0.50–1.10)
GFR, Est African American: >89 mL/min
GFR, Est Non African American: 84 mL/min
Glucose, Bld: 94 mg/dL (ref 70–99)
Potassium: 4 mEq/L (ref 3.5–5.3)

## 2011-12-31 LAB — T-HELPER CELL (CD4) - (RCID CLINIC ONLY): CD4 T Cell Abs: 200 uL — ABNORMAL LOW (ref 400–2700)

## 2012-01-01 LAB — HIV-1 RNA QUANT-NO REFLEX-BLD: HIV 1 RNA Quant: <20 copies/mL (ref <20)

## 2012-01-11 ENCOUNTER — Other Ambulatory Visit: Payer: Self-pay | Admitting: *Deleted

## 2012-01-11 DIAGNOSIS — M549 Dorsalgia, unspecified: Secondary | ICD-10-CM

## 2012-01-12 MED ORDER — GABAPENTIN 100 MG PO CAPS: 100.0000 mg | ORAL_CAPSULE | Freq: Every evening | ORAL | Status: DC | PRN

## 2012-01-20 ENCOUNTER — Ambulatory Visit (INDEPENDENT_AMBULATORY_CARE_PROVIDER_SITE_OTHER): Payer: Medicare Other | Admitting: Sports Medicine

## 2012-01-20 ENCOUNTER — Encounter: Payer: Self-pay | Admitting: Sports Medicine

## 2012-01-20 VITALS — BP 134/77 | HR 79 | Temp 97.8°F | Ht 66.0 in | Wt 184.1 lb

## 2012-01-20 DIAGNOSIS — M549 Dorsalgia, unspecified: Secondary | ICD-10-CM

## 2012-01-20 DIAGNOSIS — G629 Polyneuropathy, unspecified: Secondary | ICD-10-CM

## 2012-01-20 DIAGNOSIS — G479 Sleep disorder, unspecified: Secondary | ICD-10-CM

## 2012-01-20 DIAGNOSIS — G609 Hereditary and idiopathic neuropathy, unspecified: Secondary | ICD-10-CM

## 2012-01-20 MED ORDER — GABAPENTIN 100 MG PO CAPS: 100.0000 mg | ORAL_CAPSULE | Freq: Every evening | ORAL | Status: DC | PRN

## 2012-01-20 MED ORDER — HYDROCODONE-ACETAMINOPHEN 5-325 MG PO TABS: 1.0000 | ORAL_TABLET | Freq: Three times a day (TID) | ORAL | Status: DC | PRN

## 2012-01-20 NOTE — Patient Instructions (Addendum)
It was good to see you.  Enjoy the holidays.  Continue your exercises and try to get to PT as soon as possible for your back.   You can continue to increase your Neurontin up to 3 pills before bed

## 2012-01-28 NOTE — Assessment & Plan Note (Signed)
Will have pt titrate as above

## 2012-01-28 NOTE — Assessment & Plan Note (Signed)
>>  ASSESSMENT AND PLAN FOR BACK PAIN WRITTEN ON 01/28/2012 12:45 PM BY RIGBY, Kaislee Chao D, DO  Has been referred to PT for back pain, has not been Rx for chronic pain meds

## 2012-01-28 NOTE — Progress Notes (Signed)
  [facility]  Patient name: Courtney Hamilton MRN [id number]  Date of birth: [birth year]  CC & HPI:  Courtney Hamilton is a 51 y.o. female presenting today for follow up of:  # Chronic back pain: Reports constant worsening mid low back pain.  Concerned because traveling for the holiday and concerned about how she will do.  Reports her pain medicine increases her ability to function. as not been doing exercises or using ice.  No falls, no new overt weakness or leg paresthesias. # Difficulty Sleeping: difficulty with onset and maintenance.  Improved on neurontin, sleep onset is easier, only taking 1qhs #HIV: followed by ID, on Atripla.  To follow up in 2 months, CD4 count low at last check # LE swelling: resolved ------------------------------------------------------------------------------------------------------------------ Medication Compliance: compliant all of the time   ROS:  PER HPI  Pertinent History Reviewed:  Medical & Surgical Hx:  Reviewed: Significant for HIV, HLD, recentt new onset B LE edema Medications: Reviewed & Updated - see associated section Social History: Reviewed -  reports that she has quit smoking. Her smoking use included Cigarettes. She has a 30 pack-year smoking history. She has never used smokeless tobacco.   Objective Findings:  Vitals:  Filed Vitals:   01/20/12 1431  BP: 134/77  Pulse: 79  Temp: 97.8 F (36.6 C)    PE: GENERAL:  Adult AA female. In no discomfort; no respiratory distress. PSYCH: Alert and appropriately interactive; Insight:Good   H&N: AT/[place], trachea midline EENT:  MMM, no scleral icterus, EOMi HEART: RRR, S1/S2 heard, no murmur LUNGS: CTA B, no wheezes, no crackles EXTREMITIES: Moves all 4 extremities spontaneously, warm well perfused, trace pretibial edema, bilateral DP and PT pulses 2/4.   BACK:  Mid low back pain, TTP over B sacral bases   Assessment & Plan:

## 2012-01-28 NOTE — Assessment & Plan Note (Signed)
Has been referred to PT for back pain, has not been Rx for chronic pain meds

## 2012-01-28 NOTE — Assessment & Plan Note (Signed)
Increase neurontin to help with pain at night

## 2012-03-14 ENCOUNTER — Telehealth: Payer: Self-pay | Admitting: *Deleted

## 2012-03-14 ENCOUNTER — Other Ambulatory Visit: Payer: Self-pay | Admitting: *Deleted

## 2012-03-14 ENCOUNTER — Ambulatory Visit: Payer: Medicare Other | Admitting: Internal Medicine

## 2012-03-14 DIAGNOSIS — M549 Dorsalgia, unspecified: Secondary | ICD-10-CM

## 2012-03-14 DIAGNOSIS — B20 Human immunodeficiency virus [HIV] disease: Secondary | ICD-10-CM

## 2012-03-14 MED ORDER — OMEPRAZOLE 40 MG PO CPDR: 40.0000 mg | DELAYED_RELEASE_CAPSULE | Freq: Every day | ORAL | Status: DC

## 2012-03-14 MED ORDER — GABAPENTIN 100 MG PO CAPS: 100.0000 mg | ORAL_CAPSULE | Freq: Every evening | ORAL | Status: DC | PRN

## 2012-03-14 MED ORDER — EFAVIRENZ-EMTRICITAB-TENOFOVIR 600-200-300 MG PO TABS: 1.0000 | ORAL_TABLET | Freq: Every day | ORAL | Status: DC

## 2012-03-14 NOTE — Telephone Encounter (Signed)
Patient no-showed for appointment 03/14/12; is still out of state taking care of her sick son. Patient encouraged to reschedule appointment for when she is back in town. Patient asked for 1 month refills on her medications; refills sent electronically to her pharmacy of choice. Andree Coss, RN

## 2012-04-04 ENCOUNTER — Telehealth: Payer: Self-pay | Admitting: *Deleted

## 2012-04-04 NOTE — Telephone Encounter (Signed)
Message left requesting pt call [facility] for Lab, MD and PAP smear appts.

## 2012-05-10 ENCOUNTER — Other Ambulatory Visit: Payer: Self-pay | Admitting: Family Medicine

## 2012-05-10 ENCOUNTER — Ambulatory Visit (INDEPENDENT_AMBULATORY_CARE_PROVIDER_SITE_OTHER): Payer: Medicare Other | Admitting: Family Medicine

## 2012-05-10 ENCOUNTER — Encounter: Payer: Self-pay | Admitting: Family Medicine

## 2012-05-10 VITALS — BP 117/77 | HR 67 | Temp 98.8°F | Ht 66.0 in | Wt 180.0 lb

## 2012-05-10 DIAGNOSIS — K922 Gastrointestinal hemorrhage, unspecified: Secondary | ICD-10-CM

## 2012-05-10 DIAGNOSIS — M549 Dorsalgia, unspecified: Secondary | ICD-10-CM

## 2012-05-10 DIAGNOSIS — K625 Hemorrhage of anus and rectum: Secondary | ICD-10-CM

## 2012-05-10 DIAGNOSIS — R109 Unspecified abdominal pain: Secondary | ICD-10-CM

## 2012-05-10 HISTORY — DX: Gastrointestinal hemorrhage, unspecified: K92.2

## 2012-05-10 MED ORDER — TRAMADOL HCL 50 MG PO TABS: 50.0000 mg | ORAL_TABLET | Freq: Three times a day (TID) | ORAL | Status: DC | PRN

## 2012-05-10 NOTE — Assessment & Plan Note (Addendum)
Lower Gi bleeding; R/O gastritis,PUD,bowel CA due to Fhx. Neg stool guaiac. She is hemodynamically stable. I recommended high fiber diet and adequate fluid intake to improve stool consistency. Additional stool card was given for her to take home and return with her stool for repeat testing. I referred her to GI for scoping. Patient advised to go to the ER if symptom persist or worsens. She verbalized understanding.

## 2012-05-10 NOTE — Progress Notes (Addendum)
Subjective:     Patient ID: Courtney Hamilton, female   DOB: [birth year], 52 y.o.   MRN: [id number]  Rectal Bleeding  The current episode started more than 1 week ago (In last 1 wk,she has had 2 episodes of bloody stool with blood in the toilet.last episode was 3 days ago.). The problem occurs occasionally. The pain is moderate (generalized stomach pain,but more on the right,aggravated by bowel movement.Pain 10/10 in severity.). The stool is described as bloody (Stool consistency is normal,she moves her bowel every morning.). Associated symptoms include abdominal pain and hemorrhoids. Pertinent negatives include no anorexia, no fever, no diarrhea, no nausea, no rectal pain and no vomiting. She has been eating and drinking normally. Her past medical history does not include recent change in diet.  Patient also has hx of hemorrhoid,diverticulosis,fx of colon cancer in her mother who died at 72,her maternal uncle also had colon cancer,last colonoscopy was in 2011 with multiple polyps,was advised to repeat colonoscopy in few years. Back pain:C/O back pain for 30 yrs after multiple episodes of fall,she was supposed to see her pmd tomorrow for pain med,currently still having pain about 7/10 in severity,she does not have any pain medicine,she was on hydrocodone.Patient stated will set up PT appointment with her PMD.   Family History  Problem Relation Age of Onset   Asthma Father    Diabetes Brother    Hypertension Brother    Current Outpatient Prescriptions on File Prior to Visit  Medication Sig Dispense Refill   clotrimazole (MYCELEX) 10 MG troche        efavirenz-emtricitabine-tenofovir (ATRIPLA) 600-200-300 MG per tablet Take 1 tablet by mouth at bedtime.  30 tablet  0   Atovaquone (MEPRON PO) Take by mouth.       gabapentin (NEURONTIN) 100 MG capsule Take 1-3 capsules (100-300 mg total) by mouth at bedtime and may repeat dose one time if needed.  90 capsule  0   HYDROcodone-acetaminophen (NORCO)  5-325 MG per tablet Take 1 tablet by mouth every 8 (eight) hours as needed for pain.  60 tablet  0   HYDROcodone-acetaminophen (NORCO) 5-325 MG per tablet Take 1 tablet by mouth every 8 (eight) hours as needed for pain.  60 tablet  0   omeprazole (PRILOSEC) 40 MG capsule Take 1 capsule (40 mg total) by mouth daily.  30 capsule  0   No current facility-administered medications on file prior to visit.   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  Negative for fever and fatigue.  Respiratory: Negative.   Cardiovascular: Negative.   Gastrointestinal: Positive for abdominal pain, blood in stool, hematochezia and hemorrhoids. Negative for nausea, vomiting, diarrhea, constipation, rectal pain and anorexia.  Genitourinary: Negative.   Musculoskeletal: Positive for back pain. Negative for gait problem.  Neurological: Negative for dizziness.  All other systems reviewed and are negative.       Objective:   Physical Exam  Nursing note and vitals reviewed. Constitutional: She appears well-developed. No distress.  Eyes:  No palor  Cardiovascular: Normal rate, regular rhythm, normal heart sounds and intact distal pulses.   No murmur heard.  Pulmonary/Chest: Effort normal and breath sounds normal. No respiratory distress. She has no wheezes. She exhibits no tenderness.  Abdominal: Soft. Bowel sounds are normal. She exhibits no distension and no mass. There is no tenderness. There is no rebound and no guarding.  Longitudinal abdominal scar on right mid abdomen ( S/P Cholecystectomy)  Genitourinary: Rectal exam shows tenderness. Rectal exam shows no fissure, no mass and anal tone normal. Guaiac  negative stool.  Very small stool on glove  Musculoskeletal: Normal range of motion.       Lumbar back: She exhibits normal range of motion and no bony tenderness.  Skin: No pallor.

## 2012-05-10 NOTE — Patient Instructions (Signed)
Rectal Bleeding Rectal bleeding is when blood passes out of the anus. It is usually a sign that something is wrong. It may not be serious, but it should always be evaluated. Rectal bleeding may present as bright red blood or extremely dark stools. The color may range from dark red or maroon to black (like tar). It is important that the cause of rectal bleeding be identified so treatment can be started and the problem corrected. CAUSES   Hemorrhoids. These are enlarged (dilated) blood vessels or veins in the anal or rectal area.  Fistulas. Theseare abnormal, burrowing channels that usually run from inside the rectum to the skin around the anus. They can bleed.  Anal fissures. This is a tear in the tissue of the anus. Bleeding occurs with bowel movements.  Diverticulosis. This is a condition in which pockets or sacs project from the bowel wall. Occasionally, the sacs can bleed.  Diverticulitis. Thisis an infection involving diverticulosis of the colon.  Proctitis and colitis. These are conditions in which the rectum, colon, or both, can become inflamed and pitted (ulcerated).  Polyps and cancer. Polyps are non-cancerous (benign) growths in the colon that may bleed. Certain types of polyps turn into cancer.  Protrusion of the rectum. Part of the rectum can project from the anus and bleed.  Certain medicines.  Intestinal infections.  Blood vessel abnormalities. HOME CARE INSTRUCTIONS  Eat a high-fiber diet to keep your stool soft.  Limit activity.  Drink enough fluids to keep your urine clear or pale yellow.  Warm baths may be useful to soothe rectal pain.  Follow up with your caregiver as directed. SEEK IMMEDIATE MEDICAL CARE IF:  You develop increased bleeding.  You have black or dark red stools.  You vomit blood or material that looks like coffee grounds.  You have abdominal pain or tenderness.  You have a fever.  You feel weak, nauseous, or you faint.  You have  severe rectal pain or you are unable to have a bowel movement. MAKE SURE YOU:  Understand these instructions.  Will watch your condition.  Will get help right away if you are not doing well or get worse. Document Released: 08/15/2001 Document Revised: 05/18/2011 Document Reviewed: 08/10/2010 Exit[employer] Patient Information 2013 [employer].  

## 2012-05-10 NOTE — Assessment & Plan Note (Signed)
>>  ASSESSMENT AND PLAN FOR BACK PAIN WRITTEN ON 05/10/2012  2:13 PM BY Janit Pagan, MD  Back pain: this is chronic. She had imaging study done which I reviewed,significant for DJD. She will schedule PT with her PMD as planned. I prescribed Tramadol prn pain pending f/u with her PMD who will review her pain med. F/U in 1 wk with PMD.

## 2012-05-10 NOTE — Assessment & Plan Note (Signed)
Abdominal pain: associated with rectal bleeding. Etiology unclear,PUD vs gastritis,constipation unlikely from her hx. Abdominal exam benign. She was prescribed tramadol for back pain,she is advised she may also use this if stomach pain is unbearable. Patient to call or go to the ER if symptom worsens,she verbalized understanding.

## 2012-05-10 NOTE — Assessment & Plan Note (Signed)
Back pain: this is chronic. She had imaging study done which I reviewed,significant for DJD. She will schedule PT with her PMD as planned. I prescribed Tramadol prn pain pending f/u with her PMD who will review her pain med. F/U in 1 wk with PMD.

## 2012-05-11 ENCOUNTER — Ambulatory Visit: Payer: Medicare Other | Admitting: Sports Medicine

## 2012-05-18 ENCOUNTER — Ambulatory Visit (INDEPENDENT_AMBULATORY_CARE_PROVIDER_SITE_OTHER): Payer: Medicare Other | Admitting: Sports Medicine

## 2012-05-18 VITALS — BP 120/75 | HR 82 | Temp 98.8°F | Ht 66.0 in | Wt 175.0 lb

## 2012-05-18 DIAGNOSIS — M549 Dorsalgia, unspecified: Secondary | ICD-10-CM

## 2012-05-18 DIAGNOSIS — Z21 Asymptomatic human immunodeficiency virus [HIV] infection status: Secondary | ICD-10-CM

## 2012-05-18 DIAGNOSIS — K5909 Other constipation: Secondary | ICD-10-CM | POA: Insufficient documentation

## 2012-05-18 DIAGNOSIS — K5900 Constipation, unspecified: Secondary | ICD-10-CM

## 2012-05-18 DIAGNOSIS — G479 Sleep disorder, unspecified: Secondary | ICD-10-CM

## 2012-05-18 MED ORDER — TRAMADOL HCL 50 MG PO TABS: 50.0000 mg | ORAL_TABLET | Freq: Three times a day (TID) | ORAL | Status: DC | PRN

## 2012-05-18 MED ORDER — MAGNESIUM CITRATE PO SOLN: 296.0000 mL | Freq: Once | ORAL | Status: DC

## 2012-05-18 MED ORDER — DOXEPIN HCL 50 MG PO CAPS: 50.0000 mg | ORAL_CAPSULE | Freq: Every day | ORAL | Status: DC

## 2012-05-18 NOTE — Assessment & Plan Note (Addendum)
Try doxepin for sleep aid and chronic pain modulation >titrate dose if indicated

## 2012-05-18 NOTE — Assessment & Plan Note (Signed)
Has been off chronic pain meds X 3 months and reporting 10/10 pain.  Not sleeping.  Will trial doxepin and Tramadol tid. Refer to pain management as has evidence of lumbar facet arthritis and may befit from facet blocks vs chronic narcotic management

## 2012-05-18 NOTE — Assessment & Plan Note (Addendum)
Likely opioid triggered, is now off opioids and having symptoms likely due to bowel recovery. > Rx for mag citrate as pt declines GoLytely Was referred to GI for f/u colonoscopy due to multiple adenomatous polyps (reported 20) from 3 years ago.  Appointment next week.  Reassured pt that what she is seeing is not polyps and is likely due to chronic constipation in spite of daily BM (likely stool shunting around chronic impacted stools now being expelled due to regained bowel function)

## 2012-05-18 NOTE — Patient Instructions (Signed)
It was nice to see you today.   Today we discussed: 1. Back pain Lets try the following to help your back pain.  It sounds like you are able to function with your daily walking which is very reassuring and I encourage you to continue as much exercise as you can tolerate. - doxepin (SINEQUAN) 50 MG capsule; Take 1 capsule (50 mg total) by mouth at bedtime.  Dispense: 30 capsule; Refill: 1 - traMADol (ULTRAM) 50 MG tablet; Take 1 tablet (50 mg total) by mouth every 8 (eight) hours as needed for pain (back pain).  Dispense: 30 tablet; Refill: 0  I have referred you to: - Ambulatory referral to Physical Medicine Rehab to discuss your ongoing back pain and consider different types of injections  2. Sleep disorder Take: - doxepin (SINEQUAN) 50 MG capsule; Take 1 capsule (50 mg total) by mouth at bedtime.  Dispense: 30 capsule; Refill: 1  3. Chronic constipation Bowel clean out using: - magnesium citrate solution; Take 296 mLs by mouth once.  Dispense: 300 mL; Refill: 0   Please plan to return to see me in 1 month.  If you need anything prior to seeing me please call the clinic.  Please Bring all medications with you to each appointment.

## 2012-05-18 NOTE — Assessment & Plan Note (Signed)
>>  ASSESSMENT AND PLAN FOR BACK PAIN WRITTEN ON 05/18/2012  3:16 PM BY RIGBY, Chenise Mulvihill D, DO  Has been off chronic pain meds X 3 months and reporting 10/10 pain.  Not sleeping.  Will trial doxepin and Tramadol tid. Refer to pain management as has evidence of lumbar facet arthritis and may befit from facet blocks vs chronic narcotic management

## 2012-05-19 LAB — POC HEMOCCULT BLD/STL (HOME/3-CARD/SCREEN)

## 2012-05-19 NOTE — Addendum Note (Signed)
Addended by: [place], BONNIE on: 05/19/2012 05:00 PM   Modules accepted: Orders

## 2012-05-22 ENCOUNTER — Encounter: Payer: Self-pay | Admitting: Sports Medicine

## 2012-05-22 NOTE — Assessment & Plan Note (Addendum)
Followed by ID clinic but has not been seen lately.  Encouraged to follow up. Noted Pap smear due in November, 2014 Previously compliant with meds

## 2012-05-22 NOTE — Progress Notes (Signed)
  [facility]  Patient name: Courtney Hamilton MRN [id number]  Date of birth: 1960-04-22  CC & HPI:  Courtney Hamilton is a 52 y.o. female presenting today for follow up of:  # Chronic Back Pain:  Has been off of medications since after November appointment's refills ran out.  Doing okay.  Functioning at adequate level with daily walking for exercise.  Has been in [place] and did not see Dr while there.  Reporting pain is interfering but not consistent with reported activity level which is bette than previous.    # Abdominal Pain and Hx of Adenomatous Polyps:  Seen previously by Dr.  Lum Babe.  Referal to GI scheduled for next week. No melana.  Does report Grape like hard pieces of stool within normal BM, concerned these are polyps being shed.  ------------------------------------------------------------------------------------------------------------------ Medication Compliance: compliant most of the time  Diet Compliance: compliant most of the time ------------------------------------------------------------------------------------------------------------------ New Concerns:  Sleep Disorder:  Reporting having difficulty with sleep onset and maintenance.  Difficulty with getting comfortable.   ROS:  PER HPI  Pertinent History Reviewed:  Medical & Surgical Hx:  Reviewed: Significant for Prior Lumbar Stenosis and HNP per MRI obtained in [place].  Previously followed by Pain clinic in [place] and [place].  Previously has not tolerated PT as will typically exacerbate symptoms.   Medications: Reviewed & Updated - See associated section in EMR Social History: Reviewed -  reports that she has quit smoking. Her smoking use included Cigarettes. She has a 30 pack-year smoking history. She has never used smokeless tobacco.   Objective Findings:  Vitals: BP 120/75  Pulse 82  Temp(Src) 98.8 F (37.1 C) (Oral)  Ht 5\' 6"  (1.676 m)  Wt 175 lb (79.379 kg)  BMI 28.26 kg/m2  PE: GENERAL:  Adult AA  female. In no  discomfort; no respiratory distress. PSYCH: Alert and appropriately interactive; Insight:Good   H&N: AT/[place], trachea midline EENT:  MMM, no scleral icterus, EOMi HEART: RRR, S1/S2 heard, no murmur LUNGS: CTA B, no wheezes, no crackles EXTREMITIES: Moves all 4 extremities spontaneously, warm well perfused, no edema, bilateral DP and PT pulses 2/4.      Assessment & Plan:

## 2012-05-23 ENCOUNTER — Telehealth: Payer: Self-pay | Admitting: *Deleted

## 2012-05-23 NOTE — Telephone Encounter (Signed)
Message copied by Tanna Savoy on Mon May 23, 2012  9:17 AM ------      Message from: Gaspar Bidding D      Created: Sun May 22, 2012  4:10 PM       Please call patient and remind her to schedule an appointment in the Infectious Disease office. ------

## 2012-05-23 NOTE — Telephone Encounter (Signed)
Pt states she has already scheduled appt. Caley Volkert, Virgel Bouquet

## 2012-05-24 ENCOUNTER — Other Ambulatory Visit: Payer: Self-pay | Admitting: Internal Medicine

## 2012-05-24 ENCOUNTER — Other Ambulatory Visit (INDEPENDENT_AMBULATORY_CARE_PROVIDER_SITE_OTHER): Payer: Medicare Other

## 2012-05-24 ENCOUNTER — Other Ambulatory Visit: Payer: Self-pay | Admitting: Gastroenterology

## 2012-05-24 DIAGNOSIS — B20 Human immunodeficiency virus [HIV] disease: Secondary | ICD-10-CM

## 2012-05-24 DIAGNOSIS — R109 Unspecified abdominal pain: Secondary | ICD-10-CM

## 2012-05-24 LAB — CBC WITH DIFFERENTIAL/PLATELET
Basophils Relative: 1 % (ref 0–1)
Eosinophils Absolute: 0.4 10*3/uL (ref 0.0–0.7)
HCT: 39.1 % (ref 36.0–46.0)
Hemoglobin: 13.1 g/dL (ref 12.0–15.0)
MCH: 27.3 pg (ref 26.0–34.0)
MCHC: 33.5 g/dL (ref 30.0–36.0)
MCV: 81.6 fL (ref 78.0–100.0)
Monocytes Absolute: 0.7 10*3/uL (ref 0.1–1.0)
Monocytes Relative: 9 % (ref 3–12)

## 2012-05-24 LAB — COMPREHENSIVE METABOLIC PANEL
Albumin: 4.1 g/dL (ref 3.5–5.2)
Alkaline Phosphatase: 146 U/L — ABNORMAL HIGH (ref 39–117)
BUN: 9 mg/dL (ref 6–23)
Glucose, Bld: 80 mg/dL (ref 70–99)
Potassium: 4.2 mEq/L (ref 3.5–5.3)
Total Bilirubin: 0.2 mg/dL — ABNORMAL LOW (ref 0.3–1.2)

## 2012-05-25 ENCOUNTER — Ambulatory Visit
Admission: RE | Admit: 2012-05-25 | Discharge: 2012-05-25 | Disposition: A | Discharge status: Home / Self Care | Payer: Medicare Other | Source: Ambulatory Visit | Attending: Gastroenterology | Admitting: Gastroenterology

## 2012-05-25 DIAGNOSIS — R109 Unspecified abdominal pain: Secondary | ICD-10-CM

## 2012-05-25 LAB — HIV-1 RNA QUANT-NO REFLEX-BLD: HIV-1 RNA Quant, Log: <1.3 {Log} (ref <1.30)

## 2012-05-25 LAB — T-HELPER CELL (CD4) - (RCID CLINIC ONLY): CD4 T Cell Abs: 210 uL — ABNORMAL LOW (ref 400–2700)

## 2012-05-25 MED ORDER — IOHEXOL 300 MG/ML  SOLN
100.0000 mL | Freq: Once | INTRAMUSCULAR | Status: AC | PRN
  Administered 2012-05-25: 100 mL via INTRAVENOUS

## 2012-05-27 ENCOUNTER — Encounter: Payer: Self-pay | Admitting: Physical Medicine & Rehabilitation

## 2012-05-30 ENCOUNTER — Other Ambulatory Visit: Payer: Self-pay | Admitting: Internal Medicine

## 2012-05-30 DIAGNOSIS — B20 Human immunodeficiency virus [HIV] disease: Secondary | ICD-10-CM

## 2012-05-31 ENCOUNTER — Other Ambulatory Visit: Payer: Self-pay | Admitting: Gastroenterology

## 2012-06-07 ENCOUNTER — Encounter: Payer: Self-pay | Admitting: Internal Medicine

## 2012-06-07 ENCOUNTER — Ambulatory Visit (INDEPENDENT_AMBULATORY_CARE_PROVIDER_SITE_OTHER): Payer: Medicare Other | Admitting: Internal Medicine

## 2012-06-07 VITALS — BP 133/75 | HR 64 | Temp 97.7°F | Wt 184.0 lb

## 2012-06-07 DIAGNOSIS — Z0000 Encounter for general adult medical examination without abnormal findings: Secondary | ICD-10-CM

## 2012-06-07 DIAGNOSIS — B999 Unspecified infectious disease: Secondary | ICD-10-CM

## 2012-06-07 DIAGNOSIS — B20 Human immunodeficiency virus [HIV] disease: Secondary | ICD-10-CM

## 2012-06-07 DIAGNOSIS — K625 Hemorrhage of anus and rectum: Secondary | ICD-10-CM

## 2012-06-07 DIAGNOSIS — Z23 Encounter for immunization: Secondary | ICD-10-CM

## 2012-06-07 MED ORDER — ATOVAQUONE 750 MG/5ML PO SUSP: 750.0000 mg | Freq: Every day | ORAL | Status: DC

## 2012-06-07 NOTE — Addendum Note (Signed)
Addended by: Lurlean Leyden on: 06/07/2012 01:48 PM   Modules accepted: Orders

## 2012-06-07 NOTE — Progress Notes (Signed)
[facility] NOTE  RFV: routine, last seen 7 months ago Subjective:    Patient ID: Courtney Hamilton, female    DOB: 1960-07-26, 52 y.o.   MRN: [id number]  HPI Alveria is a 52yo F with HIV, CD 4 count of 210(8%)/VL <20, currently on atripla, had stopped taking atovaquone for OI proph in November. Stayed in [place] for 3 months to stay with grandkids.  Has started wearing a wristbrace for right wrist carpal tunnel, seeing pain management.  Had colonoscopy last week and found 1 polyp, awaiting path. Rectal bleeding stopped.   Current Outpatient Prescriptions on File Prior to Visit  Medication Sig Dispense Refill  . ATRIPLA 600-200-300 MG per tablet TAKE 1 TABLET BY MOUTH AT BEDTIME.  30 tablet  2  . clotrimazole (MYCELEX) 10 MG troche       . fluconazole (DIFLUCAN) 100 MG tablet TAKE 1 TABLET (100 MG TOTAL) BY MOUTH DAILY.  14 tablet  0  . Atovaquone (MEPRON PO) Take by mouth.      . doxepin (SINEQUAN) 50 MG capsule Take 1 capsule (50 mg total) by mouth at bedtime.  30 capsule  1  . gabapentin (NEURONTIN) 100 MG capsule Take 1-3 capsules (100-300 mg total) by mouth at bedtime and may repeat dose one time if needed.  90 capsule  0  . HYDROcodone-acetaminophen (NORCO) 5-325 MG per tablet Take 1 tablet by mouth every 8 (eight) hours as needed for pain.  60 tablet  0  . HYDROcodone-acetaminophen (NORCO) 5-325 MG per tablet Take 1 tablet by mouth every 8 (eight) hours as needed for pain.  60 tablet  0  . magnesium citrate solution Take 296 mLs by mouth once.  300 mL  0  . omeprazole (PRILOSEC) 40 MG capsule Take 1 capsule (40 mg total) by mouth daily.  30 capsule  0  . traMADol (ULTRAM) 50 MG tablet Take 1 tablet (50 mg total) by mouth every 8 (eight) hours as needed for pain (back pain).  30 tablet  0   No current facility-administered medications on file prior to visit.    Social hx = spent 3 months with her grandkids who are 2 and 3 yr old. Previously all lived in [place]. 1 daughter who is an Charity fundraiser-  [place], sons in [place] and [place]. Dad is here in [place]. Started smoking again. Smokes 1-2 cig/day. No alcohol. She thinks it is stress related getting into her own apt.   Family hx = unchanged    Review of Systems 10 point ROS is negative, other than occ feels that she has "popcorn stuck" in neck. No dysphagia. No cough.    Objective:   Physical Exam BP 133/75  Pulse 64  Temp(Src) 97.7 F (36.5 C) (Oral)  Wt 184 lb (83.462 kg)  BMI 29.71 kg/m2 Physical Exam  Constitutional:  oriented to person, place, and time. appears well-developed and well-nourished. No distress.  HENT:  Mouth/Throat: Oropharynx is clear and moist. No oropharyngeal exudate. Food debris into tonsillar crypt Cardiovascular: Normal rate, regular rhythm and normal heart sounds. Exam reveals no gallop and no friction rub.  No murmur heard.  Pulmonary/Chest: Effort normal and breath sounds normal. No respiratory distress. He has no wheezes.  Lymphadenopathy:  no cervical adenopathy.   Skin: Skin is warm and dry. No rash noted. No erythema.  Psychiatric:  normal mood and affect. His behavior is normal.       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  Health maintenance = hep A #2 and Hep B #3 today. (at 6.5 months out)  Chronic pain = has appt with pain management next week

## 2012-06-13 ENCOUNTER — Encounter: Payer: Medicare Other | Attending: Physical Medicine & Rehabilitation

## 2012-06-13 ENCOUNTER — Ambulatory Visit (HOSPITAL_BASED_OUTPATIENT_CLINIC_OR_DEPARTMENT_OTHER): Payer: Medicare Other | Admitting: Physical Medicine & Rehabilitation

## 2012-06-13 ENCOUNTER — Encounter: Payer: Self-pay | Admitting: Physical Medicine & Rehabilitation

## 2012-06-13 VITALS — BP 138/85 | HR 76 | Resp 14 | Ht 66.0 in | Wt 183.0 lb

## 2012-06-13 DIAGNOSIS — Z21 Asymptomatic human immunodeficiency virus [HIV] infection status: Secondary | ICD-10-CM | POA: Insufficient documentation

## 2012-06-13 DIAGNOSIS — M25539 Pain in unspecified wrist: Secondary | ICD-10-CM | POA: Insufficient documentation

## 2012-06-13 DIAGNOSIS — M19039 Primary osteoarthritis, unspecified wrist: Secondary | ICD-10-CM | POA: Insufficient documentation

## 2012-06-13 DIAGNOSIS — M47817 Spondylosis without myelopathy or radiculopathy, lumbosacral region: Secondary | ICD-10-CM

## 2012-06-13 DIAGNOSIS — Z5181 Encounter for therapeutic drug level monitoring: Secondary | ICD-10-CM

## 2012-06-13 DIAGNOSIS — M549 Dorsalgia, unspecified: Secondary | ICD-10-CM

## 2012-06-13 DIAGNOSIS — Z7901 Long term (current) use of anticoagulants: Secondary | ICD-10-CM

## 2012-06-13 MED ORDER — TRAMADOL HCL 50 MG PO TABS: 50.0000 mg | ORAL_TABLET | Freq: Three times a day (TID) | ORAL | Status: DC | PRN

## 2012-06-13 NOTE — Patient Instructions (Addendum)
Prescription for tramadol 50 mg 3 times per day as needed at the pharmacy,You may supplement this with Tylenol if needed Start physical therapy they will contact you Your scheduled for lumbar medial branch blocks. You will need a driver. If you feel like you will be very nervous I can prescribe Valium 10 mg prior to the injection  May use Voltaren gel to the right wrist 4 times per day wear the brace as well

## 2012-06-13 NOTE — Progress Notes (Signed)
Subjective:    Patient ID: Courtney Hamilton, female    DOB: Aug 11, 1960, 52 y.o.   MRN: [id number] CC Back pain for years, started after delivery of son HPI Has been treated in [place] with facet injections which gave her some short-term relief. Tried physical therapy years ago for her back Walks 10-15 minutes 3 times per day. Trying to keep it on her weight Tried aspirin Tylenol Advil without much success Sitting with her legs extended helps. 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 General activity 7 Relation with others 7 Enjoyment of life 7 What TIME of day is your pain at its worst? varies Sleep (in general) Fair  Pain is worse with: walking, bending, sitting, standing and some activites Pain improves with: rest, pacing activities and medication Relief from Meds: 9  Mobility walk without assistance how many minutes can you walk? 15 do you drive?  no  Function disabled: date disabled 2007 I need assistance with the following:  household duties and shopping  Neuro/Psych No problems in this area  Prior Studies Any changes since last visit?  no *RADIOLOGY REPORT*  Clinical Data: Abdominal pain, bright red blood in stool  CT ABDOMEN AND PELVIS WITH CONTRAST 05/25/2012 Technique: Multidetector CT imaging of the abdomen and pelvis was  performed following the standard protocol during bolus  administration of intravenous contrast.  Contrast: OMNIPAQUE IOHEXOL 300 MG/ML SOLN  Comparison: None.  Findings: The lung bases are clear. The liver enhances with  no  focal abnormality and no ductal dilatation is seen. Surgical clips  are present from prior cholecystectomy. The pancreas is normal in  size and the pancreatic duct is not dilated. The adrenal glands  and spleen are unremarkable. The stomach is not well distended.  The kidneys enhance with no calculus or mass and no hydronephrosis  is seen. On delayed images, the pelvocaliceal systems are  unremarkable in the proximal ureters are normal in caliber. The  abdominal aorta is normal in caliber. No adenopathy is seen.  The uterus is to the left of midline with low attenuation within  the endometrial cavity. Clinical correlation is recommended. No  adnexal lesion is seen and no flow is noted within the pelvis. The  urinary bladder is not well distended. There is a moderate amount  of feces throughout the colon. No evidence of diverticulitis is  seen. The terminal ileum is unremarkable. The appendix has  previously been resected. No abdominal wall hernia is noted. The  lumbar vertebrae are in normal alignment. There is some  degenerative change involving the facet joints of L4-5 and L5-S1.  IMPRESSION:  1. No evidence of diverticulitis.  2. The uterus is to the left of midline with low attenuation  within the endometrial cavity. Correlate clinically.  3. Degenerative change involves the facet joints of L4-5 and L5-  S1.  Physicians involved in your care Any changes since last visit?  no   Family History  Problem Relation Age of Onset  . Asthma Father   . Diabetes Brother   . Hypertension Brother  History   Social History  . Marital Status: Single    Spouse Name: N/A    Number of Children: N/A  . Years of Education: N/A   Social History Main Topics  . Smoking status: Former Smoker -- 1.00 packs/day for 30 years    Types: Cigarettes  . Smokeless tobacco: Never Used  . Alcohol Use: No  . Drug Use: No     Comment: past history of cocaine  and alcohol    . Sexually Active:  No   Other Topics Concern  . None   Social History Narrative  . None   Past Surgical History  Procedure Laterality Date  . Cholecystectomy    . Appendectomy    . Ectopic pregnancy surgery    . Salivary gland surgery     Past Medical History  Diagnosis Date  . HIV (human immunodeficiency virus infection)   . Sciatica   . Ruptured lumbar disc   . Osteoporosis   . 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 TB (pulmonary tuberculosis) 1993     exposure, treated   . Herniated disc 10/07/2011   BP 138/85  Pulse 76  Resp 14  Ht 5\' 6"  (1.676 m)  Wt 183 lb (83.008 kg)  BMI 29.55 kg/m2  SpO2 100%    Review of Systems  Constitutional: Positive for diaphoresis.  Musculoskeletal: Positive for back pain.  All other systems reviewed and are negative.       Objective:   Physical Exam  Nursing note and vitals reviewed. Constitutional: She appears well-developed and well-nourished.  HENT:  Head: Normocephalic and atraumatic.  Right Ear: External ear normal.  Left Ear: External ear normal.  Eyes: Conjunctivae and EOM are normal. Pupils are equal, round, and reactive to light.  Neck: Normal range of motion. Neck supple.  Musculoskeletal:       Lumbar back: She exhibits decreased range of motion, tenderness and pain. She exhibits no edema, no deformity and no spasm.       Right hand: She exhibits swelling.  Pain over the right first carpometacarpal joint with swelling Negative Tinel's negative Phalen's negative Finkelstein's  Tenderness over the lumbar paraspinal muscles L4 L5-S1 area. Pain with extension pain with lateral bending,has full flexion  Psychiatric: She has a normal mood and affect. Her behavior is normal. Judgment and thought content normal.          Assessment & Plan:  1. Lumbar spondylosis the CT findings correlate with her pain area. She's had previous relief from facet injections. We'll schedule for L3-L4 medial branch blocks L5  dorsal ramus injection bilateral Referral to physical therapy Tramadol 90 mg 3 times a day when necessary Discussed with patient agree with plan  2. Right first carpometacarpal osteoarthritis with pain. Recommend Voltaren gel 4 times a day to that area. Recommend wrist splint. Ideally she should have a spica splint she can bring it in so I could see exactly what she has . In addition if no better may consider injection

## 2012-06-15 ENCOUNTER — Encounter: Payer: Self-pay | Admitting: *Deleted

## 2012-06-27 ENCOUNTER — Telehealth: Payer: Self-pay | Admitting: Physical Medicine & Rehabilitation

## 2012-07-07 ENCOUNTER — Encounter: Payer: Medicare Other | Attending: Physical Medicine & Rehabilitation

## 2012-07-07 ENCOUNTER — Encounter: Payer: Self-pay | Admitting: Physical Medicine & Rehabilitation

## 2012-07-07 ENCOUNTER — Ambulatory Visit: Payer: Medicare Other | Admitting: Physical Medicine & Rehabilitation

## 2012-07-07 VITALS — BP 126/76 | HR 66 | Resp 16 | Ht 66.0 in | Wt 186.0 lb

## 2012-07-07 DIAGNOSIS — Z21 Asymptomatic human immunodeficiency virus [HIV] infection status: Secondary | ICD-10-CM | POA: Insufficient documentation

## 2012-07-07 DIAGNOSIS — M47817 Spondylosis without myelopathy or radiculopathy, lumbosacral region: Secondary | ICD-10-CM

## 2012-07-07 DIAGNOSIS — M19039 Primary osteoarthritis, unspecified wrist: Secondary | ICD-10-CM | POA: Insufficient documentation

## 2012-07-07 DIAGNOSIS — M25539 Pain in unspecified wrist: Secondary | ICD-10-CM | POA: Insufficient documentation

## 2012-07-07 MED ORDER — TRAMADOL HCL 50 MG PO TABS: 100.0000 mg | ORAL_TABLET | Freq: Three times a day (TID) | ORAL | Status: DC | PRN

## 2012-07-07 NOTE — Progress Notes (Signed)
Bilateral Lumbar L3, L4  medial branch blocks and L 5 dorsal ramus injection under fluoroscopic guidance  Indication: Lumbar pain which is not relieved by medication management or other conservative care and interfering with self-care and mobility.  History of HIV, received okay from infectious disease specialist to proceed with injection. Last CD4 count was 214 last month  Informed consent was obtained after describing risks and benefits of the procedure with the patient, this includes bleeding, infection, paralysis and medication side effects.  The patient wishes to proceed and has given written consent.  The patient was placed in prone position.  The lumbar area was marked and prepped with Betadine.  One mL of 1% lidocaine was injected into each of 6 areas into the skin and subcutaneous tissue.  Then a 22-gauge 3.5 inch spinal needle was inserted targeting the junction of the left S1 superior articular process and sacral ala junction. Needle was advanced under fluoroscopic guidance.  Bone contact was made.  Omnipaque 180 was injected x 0.5 mL demonstrating no intravascular uptake.  Then a solution containing one mL of 4 mg per mL dexamethasone and 3 mL of 2% MPF lidocaine was injected x 0.5 mL.  Then the left L5 superior articular process in transverse process junction was targeted.  Bone contact was made.  Omnipaque 180 was injected x 0.5 mL demonstrating no intravascular uptake. Then a solution containing one mL of 4 mg per mL dexamethasone and 3 mL of 2% MPF lidocaine was injected x 0.5 mL.  Then the left L4 superior articular process in transverse process junction was targeted.  Bone contact was made.  Omnipaque 180 was injected x 0.5 mL demonstrating no intravascular uptake.  Then a solution containing one mL of 4 mg per mL dexamethasone and 3 mL if 2% MPF lidocaine was injected x 0.5 mL.  This same procedure was performed on the right side using the same needle, technique and injectate.  Patient  tolerated procedure well.  Post procedure instructions were given. 

## 2012-07-07 NOTE — Progress Notes (Signed)
  PROCEDURE RECORD [facility]   Name: Courtney Hamilton DOB:1960-12-03 MRN: [id number]  Date:07/07/2012  Physician: Claudette Laws, MD    Nurse/CMA: Shumaker RN  Allergies:  Allergies  Allergen Reactions  . Baclofen     Perioral paresthesia  . Bactrim (Sulfamethoxazole W-Trimethoprim)   . Doxycycline     Consent Signed: yes  Is patient diabetic? no  CBG today? n/a  Pregnant: no LMP: No LMP recorded. Patient is postmenopausal. (age 24-55)  Anticoagulants: no Anti-inflammatory: no Antibiotics: no  Procedure: bilateral medial branch blocks  Position: Prone Start Time:  12:44 End Time: 12:54  Fluoro Time: 37 seconds  RN/CMA [employer] RN    Time 12:16 12:58    BP 126/76 136/87    Pulse 66 66    Respirations 16 16    O2 Sat 99 99    S/S 6 6    Pain Level 10 10/10     D/C home with father, vernell, patient A & O X 3, D/C instructions reviewed, and sits independently.

## 2012-07-07 NOTE — Patient Instructions (Signed)
Facet Block A facet block is an injection procedure used to numb nerves near a spinal joint (facet). The injection usually includes a medicine like Novacaine (anesthetic) and a steroid medicine (similar to cortisone). The injections are made directly into the facet joint of the back. They are used for patients with several types of neck or back pain problems (such as worsening arthritis or persistent pain after surgery) that have not been helped with anti-inflammatory medications, exercise programs, physical therapy, and other forms of pain management. Multiple injections may be needed depending on how many joints are involved.  A facet block procedure can be helpful with diagnosis as well as providing therapeutic pain relief. One of three things may happen after the procedure:  The pain does not go away. This can mean that the pain is probably not coming from blocked facet joints. This information is helpful with diagnosis.  The pain goes away and stays away for a few hours but the original pain comes back and does not get better again. This information is also helpful with diagnosis. It can mean that pain is probably coming from the joints; but the steroid was not helpful for longer term pain control.  The pain goes away after the block, then returns later that day, and then gets better again over the next few days. This can mean that the block was helpful for pain control and the steroid had a longer lasting effect. If there is good, lasting benefit from the injections, the block may be repeated from 3 to 5 times. If there is good relief but it is only of short-term benefit, other procedures (such as radiofrequency lesioning) may be considered.  Note: The procedure cannot be performed if you have an active infection, a lesion on or near the area of injection, flu, cold, fever, very high blood pressure or if you are on blood thinners. Please make your doctor aware of any of these conditions. This is for  your safety!  LET YOUR CAREGIVER KNOW ABOUT:   Allergies.  Medications taken including herbs, eye drops, over the counter medications, and creams  Use of steroids (by mouth or creams).  Possible pregnancy, if applicable.  Previous problems with anesthetics or Novocaine.  History of blood clots.  History of bleeding or blood problems.  Previous surgery, particularly of the neck and/or back  Other health problems. RISKS AND COMPLICATIONS These are very uncommon but include:  Bleeding.  Injury to a nerve near the injection site.  Weakness or numbness in areas controlled by nerves near the injection site.  Infection.  Pain at the site of the injection.  Temporary fluid retention in those who are prone to this problem.  Allergic reaction to anesthetics or medicines used during the procedure. Diabetics may have a temporary increase in their blood sugar after any surgical procedure, especially if steroids are used. Stinging/burning of the numbing medicine is the most uncomfortable part of the procedure; however every person's response to any procedure is individual.  BEFORE THE PROCEDURE   Your caregiver will provide instructions about stopping any medication before the procedure.  Unless advised otherwise, if the injections are in your neck, you may take your medications as usual with a sip of water but do not eat or drink for 6 hours before the procedure.  Unless advised otherwise, you may eat, drink and take your medications as usual on the day of the procedure (both before and after) if the injections are to be in your lower back.    There is no other specific preparation necessary unless advised otherwise. PROCEDURE After checking your blood pressure, the procedure will be done in the x-ray (fluoroscopy) room while lying on your stomach. For procedures in the neck, an intravenous line is usually started. The back is then cleansed with an antiseptic soap. Sterile drapes are  placed in this area. The skin is numbed with a local anesthetic. This is felt as a stinging or burning sensation. Using x-ray guidance, needles are then advanced to the appropriate locations. Once the needles are in the proper location, the anesthetic and steroid is injected through the needles and the needles are removed. The skin is then cleansed and bandages are applied. Blood pressure will be checked again, and you will be discharged to leave with your ride after your caregiver says it is okay to go.  AFTER THE PROCEDURE  You may not drive for the remainder of the day after your procedure. An adult must be present to drive you home or to go with you in a taxi or on public transportation. The procedure will be canceled if you do not have a responsible adult with you! This is for your safety.  HOME CARE INSTRUCTIONS   The bandages noted above can be removed on the morning after the procedure.  Resume medications according to your caregiver's instructions.  No heat is to be used near or over the injected area(s) for the remainder of the day.  No tub bath or soaking in water (such as a pool, jacuzzi, etc.) for the remainder of the day.  Some local tenderness may be experienced for a couple of days after the injection. Using an ice pack three or four times a day will help this.  Keep track of the amount of pain relief as well as how long the pain relief lasted. SEEK MEDICAL CARE IF:   There is drainage from the injection site.  Pain is not controlled with medications prescribed.  There is significant bleeding or swelling. SEEK IMMEDIATE MEDICAL CARE IF:   You develop a fever of 101 F (38.3 C) or greater.  Worsening pain, swelling, and/or red streaking develops in the skin around the injection site.  Severe pain develops and cannot be controlled with medications prescribed.  You develop any headache, stiff neck, nausea, vomiting, or your eyes become very sensitive to light.  Weakness  or paralysis develops in arms or legs not present before the procedure.  You develop difficulty urinating or difficulty breathing. Document Released: 07/15/2006 Document Revised: 05/18/2011 Document Reviewed: 07/05/2008 Exit[employer] Patient Information 2013 [employer].  

## 2012-07-13 ENCOUNTER — Telehealth: Payer: Self-pay

## 2012-07-13 NOTE — Telephone Encounter (Signed)
Patient had injection about a week ago and is still in pain.  She says she did not get any relief.  She would like to know what else to do?  Please advise.

## 2012-07-14 NOTE — Telephone Encounter (Signed)
Needs to go through PT then I will re evaluate

## 2012-07-15 NOTE — Telephone Encounter (Signed)
Patient informed to give PT a try and if this does not help to schedule follow up appointment.

## 2012-07-27 ENCOUNTER — Telehealth: Payer: Self-pay

## 2012-07-27 DIAGNOSIS — M549 Dorsalgia, unspecified: Secondary | ICD-10-CM

## 2012-07-27 DIAGNOSIS — M47817 Spondylosis without myelopathy or radiculopathy, lumbosacral region: Secondary | ICD-10-CM

## 2012-07-27 NOTE — Telephone Encounter (Signed)
Patient says shot did not help and she is in increased pain.  She has tried exercising.  Patient would like a different medication.

## 2012-07-27 NOTE — Telephone Encounter (Signed)
Increase tramadol to 100 mg every 8 hours may: New prescription if needed

## 2012-07-27 NOTE — Telephone Encounter (Signed)
Patient informed and will try this as well as heat and ice.  New order for physical therapy placed.

## 2012-07-29 ENCOUNTER — Telehealth: Payer: Self-pay

## 2012-07-29 NOTE — Telephone Encounter (Signed)
Erroneous entry

## 2012-08-08 ENCOUNTER — Encounter: Payer: Medicare Other | Attending: Physical Medicine & Rehabilitation

## 2012-08-08 ENCOUNTER — Encounter: Payer: Self-pay | Admitting: Physical Medicine & Rehabilitation

## 2012-08-08 ENCOUNTER — Ambulatory Visit (HOSPITAL_BASED_OUTPATIENT_CLINIC_OR_DEPARTMENT_OTHER): Payer: Medicare Other | Admitting: Physical Medicine & Rehabilitation

## 2012-08-08 VITALS — BP 134/87 | HR 65 | Resp 14 | Wt 183.6 lb

## 2012-08-08 DIAGNOSIS — M47817 Spondylosis without myelopathy or radiculopathy, lumbosacral region: Secondary | ICD-10-CM | POA: Insufficient documentation

## 2012-08-08 DIAGNOSIS — M19049 Primary osteoarthritis, unspecified hand: Secondary | ICD-10-CM

## 2012-08-08 DIAGNOSIS — M25561 Pain in right knee: Secondary | ICD-10-CM

## 2012-08-08 DIAGNOSIS — M545 Low back pain, unspecified: Secondary | ICD-10-CM

## 2012-08-08 DIAGNOSIS — M19039 Primary osteoarthritis, unspecified wrist: Secondary | ICD-10-CM | POA: Insufficient documentation

## 2012-08-08 DIAGNOSIS — M25569 Pain in unspecified knee: Secondary | ICD-10-CM

## 2012-08-08 DIAGNOSIS — Z21 Asymptomatic human immunodeficiency virus [HIV] infection status: Secondary | ICD-10-CM | POA: Insufficient documentation

## 2012-08-08 DIAGNOSIS — M25539 Pain in unspecified wrist: Secondary | ICD-10-CM | POA: Insufficient documentation

## 2012-08-08 MED ORDER — CYCLOBENZAPRINE HCL 10 MG PO TABS: 10.0000 mg | ORAL_TABLET | Freq: Every evening | ORAL | Status: DC | PRN

## 2012-08-08 NOTE — Progress Notes (Signed)
Subjective:    Patient ID: Courtney Hamilton, female    DOB: April 13, 1960, 52 y.o.   MRN: [id number]  HPI Bilateral L3 L4-L5 medial branch blocks may first 2014.  No benefit from injections Now doing exercise class twice a week.  Zumba Now living on her own standing more Her back " burns" when she stands for a prolonged period of time R knee pain Improved over the last 2 days after starting voltaren gel, still feels a bit tight Has an appointment scheduled with physical therapy this week Pain Inventory Average Pain 8 Pain Right Now 9 My pain is constant, sharp and burning  In the last 24 hours, has pain interfered with the following? General activity 10 Relation with others 8 Enjoyment of life 10 What TIME of day is your pain at its worst? varies Sleep (in general) Fair  Pain is worse with: walking, bending and standing Pain improves with: rest and medication Relief from Meds: 2  Mobility walk without assistance how many minutes can you walk? 10 do you drive?  no  Function disabled: date disabled 2007  Neuro/Psych No problems in this area  Prior Studies Any changes since last visit?  no  Physicians involved in your care Any changes since last visit?  no   Family History  Problem Relation Age of Onset  . Asthma Father   . Diabetes Brother   . Hypertension Brother    History   Social History  . Marital Status: Single    Spouse Name: N/A    Number of Children: N/A  . Years of Education: N/A   Social History Main Topics  . Smoking status: Former Smoker -- 1.00 packs/day for 30 years    Types: Cigarettes  . Smokeless tobacco: Never Used  . Alcohol Use: No  . Drug Use: No     Comment: past history of cocaine  and alcohol    . Sexually Active: No   Other Topics Concern  . None   Social History Narrative  . None   Past Surgical History  Procedure Laterality Date  . Cholecystectomy    . Appendectomy    . Ectopic pregnancy surgery    . Salivary gland  surgery     Past Medical History  Diagnosis Date  . HIV (human immunodeficiency virus infection)   . Sciatica   . Ruptured lumbar disc   . Osteoporosis   . 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 TB (pulmonary tuberculosis) 1993     exposure, treated   . Herniated disc 10/07/2011   BP 134/87  Pulse 65  Resp 14  Wt 183 lb 9.6 oz (83.28 kg)  BMI 29.65 kg/m2  SpO2 100%   Review of Systems  Musculoskeletal: Positive for back pain.  All other systems reviewed and are negative.       Objective:   Physical Exam  Nursing note and vitals reviewed. Constitutional: She appears well-developed and well-nourished.  HENT:  Head: Normocephalic and atraumatic.  Eyes: Conjunctivae and EOM are normal. Pupils are equal, round, and reactive to light.  Neck: Normal range of motion. Neck supple.  Musculoskeletal:       Right wrist: She exhibits tenderness.       Right knee: She exhibits effusion. She exhibits normal range of motion. No tenderness found.       Lumbar back: She exhibits decreased range of motion and tenderness.  Right knee minimal effusion compared to  left side. No range of motion deficits. Normal stability Right hand negative Finkelstein's. Pain with carpometacarpal stress also dorsal CMC tenderness. No tenderness over tendon insertions at the wrist on radial aspect  Back has mild tenderness PSIS area mainly on the right side. Pain with lateral bending on the right side at the lumbosacral junction. Decreased lumbar flexion and extension.  Neurological: No sensory deficit.  Psychiatric: She has a normal mood and affect. Her behavior is normal. Judgment and thought content normal.          Assessment & Plan:  1. Lumbar spondylosis on imaging studies however no response to medial branch blocks. May have SI arthropathy versus discogenic and muscle related pain. Recommend physical therapy. Hold off on any or the spine injections. Continue tramadol 100  mg 3 times a day 2. Right wrist carpal metacarpal pain recommend spica splint 24 hours a day for 1-2 weeks. Reassess in 4-6 weeks. May use diclofenac gel 3. Right knee pain improved with diclofenac gel . Minimal effusion. No signs of inflammation. No recent trauma. May use ice 20-30 minutes after exercise activities or prolonged standing

## 2012-08-08 NOTE — Patient Instructions (Addendum)
R wrist and hand SPICA splint for arthritis  Go to PT and get a home exercise program from them  Cont Volt gel for R knee, may use ice for 2-30 minutes after exercise or standing  Rec Heat for low back pain  Cont tramadol Muscle relaxer cyclobenzaprine at night as needed

## 2012-08-09 ENCOUNTER — Ambulatory Visit: Payer: Medicare Other | Attending: Physical Medicine & Rehabilitation | Admitting: Physical Therapy

## 2012-08-23 ENCOUNTER — Other Ambulatory Visit: Payer: Medicare Other

## 2012-08-23 DIAGNOSIS — B20 Human immunodeficiency virus [HIV] disease: Secondary | ICD-10-CM

## 2012-08-23 LAB — CBC WITH DIFFERENTIAL/PLATELET
Basophils Absolute: 0.1 10*3/uL (ref 0.0–0.1)
Eosinophils Absolute: 0.4 10*3/uL (ref 0.0–0.7)
Eosinophils Relative: 5 % (ref 0–5)
Lymphs Abs: 2.8 10*3/uL (ref 0.7–4.0)
MCH: 28.4 pg (ref 26.0–34.0)
MCV: 84.8 fL (ref 78.0–100.0)
Monocytes Absolute: 0.6 10*3/uL (ref 0.1–1.0)
Platelets: 280 10*3/uL (ref 150–400)
RDW: 14.7 % (ref 11.5–15.5)

## 2012-08-24 LAB — COMPREHENSIVE METABOLIC PANEL
Albumin: 4.5 g/dL (ref 3.5–5.2)
Alkaline Phosphatase: 157 U/L — ABNORMAL HIGH (ref 39–117)
BUN: 10 mg/dL (ref 6–23)
CO2: 27 mEq/L (ref 19–32)
Glucose, Bld: 95 mg/dL (ref 70–99)
Potassium: 4.3 mEq/L (ref 3.5–5.3)

## 2012-08-24 LAB — T-HELPER CELL (CD4) - (RCID CLINIC ONLY)
CD4 % Helper T Cell: 8 % — ABNORMAL LOW (ref 33–55)
CD4 T Cell Abs: 230 uL — ABNORMAL LOW (ref 400–2700)

## 2012-08-26 ENCOUNTER — Other Ambulatory Visit: Payer: Self-pay | Admitting: Internal Medicine

## 2012-09-06 ENCOUNTER — Ambulatory Visit (INDEPENDENT_AMBULATORY_CARE_PROVIDER_SITE_OTHER): Payer: Medicare Other | Admitting: Internal Medicine

## 2012-09-06 ENCOUNTER — Other Ambulatory Visit: Payer: Self-pay

## 2012-09-06 ENCOUNTER — Encounter: Payer: Self-pay | Admitting: Internal Medicine

## 2012-09-06 VITALS — BP 110/76 | HR 70 | Temp 98.0°F | Ht 66.0 in | Wt 186.8 lb

## 2012-09-06 DIAGNOSIS — B20 Human immunodeficiency virus [HIV] disease: Secondary | ICD-10-CM

## 2012-09-06 MED ORDER — TRAMADOL HCL 50 MG PO TABS: 100.0000 mg | ORAL_TABLET | Freq: Three times a day (TID) | ORAL | Status: DC | PRN

## 2012-09-06 MED ORDER — CYCLOBENZAPRINE HCL 10 MG PO TABS: 10.0000 mg | ORAL_TABLET | Freq: Every evening | ORAL | Status: DC | PRN

## 2012-09-06 MED ORDER — FLUCONAZOLE 100 MG PO TABS: 100.0000 mg | ORAL_TABLET | Freq: Every day | ORAL | Status: DC

## 2012-09-06 NOTE — Progress Notes (Signed)
[facility] NOTE  RFV: routine  Subjective:    Patient ID: Courtney Hamilton, female    DOB: 1960-05-01, 52 y.o.   MRN: [id number]  HPI [name] is 52yo F with HIV, CD 4 count 230 (8%)/VL <20, currently taking on Atripla.   nightsweats & hotflashes most recently recently. 2 lb gain since April. Feeling her goal weight is 160-170. No fever, chills, nor cough, diarrhea or rash  Going to [place] for the month of July to stay with grandkids.    Current Outpatient Prescriptions on File Prior to Visit  Medication Sig Dispense Refill  . ATRIPLA 600-200-300 MG per tablet TAKE 1 TABLET BY MOUTH AT BEDTIME.  30 tablet  2  . Calcium-Magnesium-Zinc 167-83-8 MG TABS Take 1 tablet by mouth daily.      . clotrimazole (MYCELEX) 10 MG troche       . vitamin C (ASCORBIC ACID) 500 MG tablet Take 500 mg by mouth daily.       No current facility-administered medications on file prior to visit.   Active Ambulatory Problems    Diagnosis Date Noted  . Back pain 07/21/2011  . HIV positive 08/03/2011  . Sleep disorder 08/03/2011  . Osteoporosis 03/09/2008  . Recurrent UTI 08/06/2011  . Piriformis syndrome of right side 09/17/2011  . History of tuberculosis exposure 10/01/2011  . Thrush, oral 10/01/2011  . Herpes simplex 10/07/2011  . Peripheral neuropathy 10/07/2011  . Hypercholesteremia 10/07/2011  . Lymphadenitis, acute 10/25/2011  . Bilateral lower extremity edema 12/10/2011  . Reflux 12/13/2011  . Dry skin 12/13/2011  . Lower GI bleed 05/10/2012  . Abdominal  pain, other specified site 05/10/2012  . Chronic constipation 05/18/2012   Resolved Ambulatory Problems    Diagnosis Date Noted  . No Resolved Ambulatory Problems   Past Medical History  Diagnosis Date  . HIV (human immunodeficiency virus infection)   . Sciatica   . Ruptured lumbar disc   . Substance abuse   . TB (pulmonary tuberculosis) 1993   . Herniated disc 10/07/2011     Review of Systems Review of Systems  Constitutional:  Negative for fever, chills, diaphoresis, activity change, appetite change, fatigue and unexpected weight change.  HENT: Negative for congestion, sore throat, rhinorrhea, sneezing, trouble swallowing and sinus pressure.  Eyes: Negative for photophobia and visual disturbance.  Respiratory: Negative for cough, chest tightness, shortness of breath, wheezing and stridor.  Cardiovascular: Negative for chest pain, palpitations and leg swelling.  Gastrointestinal: Negative for nausea, vomiting, abdominal pain, diarrhea, constipation, blood in stool, abdominal distention and anal bleeding.  Genitourinary: Negative for dysuria, hematuria, flank pain and difficulty urinating.  Musculoskeletal: Negative for myalgias, back pain, joint swelling, arthralgias and gait problem.  Skin: Negative for color change, pallor, rash and wound.  Neurological: Negative for dizziness, tremors, weakness and light-headedness.  Hematological: Negative for adenopathy. Does not bruise/bleed easily.  Psychiatric/Behavioral: Negative for behavioral problems, confusion, sleep disturbance, dysphoric mood, decreased concentration and agitation.       Objective:   Physical Exam BP 110/76  Pulse 70  Temp(Src) 98 F (36.7 C) (Oral)  Ht 5\' 6"  (1.676 m)  Wt 186 lb 12 oz (84.709 kg)  BMI 30.16 kg/m2 Physical Exam  Constitutional: oriented to person, place, and time. appears well-developed and well-nourished. No distress.  HENT:  Mouth/Throat: Oropharynx is clear and moist. No oropharyngeal exudate.  Cardiovascular: Normal rate, regular rhythm and normal heart sounds. Exam reveals no gallop and no friction rub.  No murmur heard.  Pulmonary/Chest: Effort  normal and breath sounds normal. No respiratory distress.  no wheezes.  Lymphadenopathy:  no cervical adenopathy.  Neurological: He is alert and oriented to person, place, and time.  Skin: Skin is warm and dry. No rash noted. No erythema.  Psychiatric: a normal mood and affect.  His behavior is normal.          Assessment & Plan:   hiv = continue on atripla  Weight gain = will have appt with nutritionist  OI porph = will need atovaquone since cd 4 count % is 8.  Health maintenance = will need RPR and lipid profile plus mammo at next lab draw

## 2012-09-19 ENCOUNTER — Ambulatory Visit: Payer: Medicare Other | Admitting: Physical Medicine and Rehabilitation

## 2012-10-24 ENCOUNTER — Ambulatory Visit: Payer: Medicare Other

## 2012-11-14 ENCOUNTER — Other Ambulatory Visit: Payer: Self-pay

## 2012-11-14 MED ORDER — TRAMADOL HCL 50 MG PO TABS: 100.0000 mg | ORAL_TABLET | Freq: Three times a day (TID) | ORAL | Status: DC | PRN

## 2012-11-28 ENCOUNTER — Other Ambulatory Visit: Payer: Self-pay | Admitting: Internal Medicine

## 2012-12-09 ENCOUNTER — Telehealth: Payer: Self-pay | Admitting: *Deleted

## 2012-12-09 MED ORDER — CYCLOBENZAPRINE HCL 10 MG PO TABS: 10.0000 mg | ORAL_TABLET | Freq: Every evening | ORAL | Status: DC | PRN

## 2012-12-09 NOTE — Telephone Encounter (Signed)
Refill cyclobenzaprine. Refilled x 1 with message to pharmacy further refills require appt.

## 2012-12-12 ENCOUNTER — Other Ambulatory Visit: Payer: Medicare Other

## 2012-12-12 ENCOUNTER — Other Ambulatory Visit: Payer: Self-pay | Admitting: *Deleted

## 2012-12-12 ENCOUNTER — Other Ambulatory Visit (HOSPITAL_COMMUNITY)
Admission: RE | Admit: 2012-12-12 | Discharge: 2012-12-12 | Disposition: A | Discharge status: Home / Self Care | Payer: Medicare Other | Source: Ambulatory Visit | Attending: Infectious Diseases | Admitting: Infectious Diseases

## 2012-12-12 DIAGNOSIS — B20 Human immunodeficiency virus [HIV] disease: Secondary | ICD-10-CM

## 2012-12-12 DIAGNOSIS — Z79899 Other long term (current) drug therapy: Secondary | ICD-10-CM

## 2012-12-12 DIAGNOSIS — Z113 Encounter for screening for infections with a predominantly sexual mode of transmission: Secondary | ICD-10-CM

## 2012-12-12 LAB — CBC WITH DIFFERENTIAL/PLATELET
Basophils Relative: 0 % (ref 0–1)
Eosinophils Absolute: 0.3 10*3/uL (ref 0.0–0.7)
Eosinophils Relative: 4 % (ref 0–5)
Hemoglobin: 13.2 g/dL (ref 12.0–15.0)
Lymphs Abs: 2.1 10*3/uL (ref 0.7–4.0)
MCH: 28.9 pg (ref 26.0–34.0)
MCHC: 34.2 g/dL (ref 30.0–36.0)
Monocytes Relative: 10 % (ref 3–12)
Neutrophils Relative %: 58 % (ref 43–77)
RBC: 4.56 MIL/uL (ref 3.87–5.11)
RDW: 14.1 % (ref 11.5–15.5)

## 2012-12-12 LAB — COMPLETE METABOLIC PANEL WITH GFR
ALT: 9 U/L (ref 0–35)
Albumin: 4.1 g/dL (ref 3.5–5.2)
BUN: 9 mg/dL (ref 6–23)
CO2: 27 mEq/L (ref 19–32)
Calcium: 9.6 mg/dL (ref 8.4–10.5)
Chloride: 104 mEq/L (ref 96–112)
GFR, Est African American: >89 mL/min
GFR, Est Non African American: >89 mL/min
Glucose, Bld: 78 mg/dL (ref 70–99)
Potassium: 4.1 mEq/L (ref 3.5–5.3)
Sodium: 140 mEq/L (ref 135–145)
Total Bilirubin: 0.3 mg/dL (ref 0.3–1.2)
Total Protein: 7.3 g/dL (ref 6.0–8.3)

## 2012-12-12 LAB — LIPID PANEL: HDL: 41 mg/dL (ref >39)

## 2012-12-12 LAB — RPR

## 2012-12-13 LAB — HIV-1 RNA QUANT-NO REFLEX-BLD
HIV 1 RNA Quant: <20 copies/mL (ref <20)
HIV-1 RNA Quant, Log: <1.3 {Log} (ref <1.30)

## 2012-12-26 ENCOUNTER — Encounter: Payer: Self-pay | Admitting: Internal Medicine

## 2012-12-26 ENCOUNTER — Ambulatory Visit (INDEPENDENT_AMBULATORY_CARE_PROVIDER_SITE_OTHER): Payer: Medicare Other | Admitting: Internal Medicine

## 2012-12-26 VITALS — BP 177/100 | HR 67 | Temp 98.0°F | Wt 178.0 lb

## 2012-12-26 DIAGNOSIS — B20 Human immunodeficiency virus [HIV] disease: Secondary | ICD-10-CM

## 2012-12-26 MED ORDER — EFAVIRENZ-EMTRICITAB-TENOFOVIR 600-200-300 MG PO TABS: 1.0000 | ORAL_TABLET | Freq: Every day | ORAL | Status: DC

## 2012-12-26 NOTE — Progress Notes (Signed)
[facility] NOTE  RFV: routine Subjective:    Patient ID: Courtney Hamilton, female    DOB: May 28, 1960, 52 y.o.   MRN: [id number]  HPI CD 4 count of 220/VL<20, on atripla who reports having a headache due to stress, took an alleve OTC this morning with minimal improvement. No recent illness in the last 3 months. Takes atripla nightly, no missing doses. She states she has increased stressors with her family living with her. Tending to grandkids  Current Outpatient Prescriptions on File Prior to Visit  Medication Sig Dispense Refill  . ATRIPLA 600-200-300 MG per tablet TAKE 1 TABLET BY MOUTH AT BEDTIME.  30 tablet  0  . Calcium-Magnesium-Zinc 167-83-8 MG TABS Take 1 tablet by mouth daily.      . clotrimazole (MYCELEX) 10 MG troche       . fluconazole (DIFLUCAN) 100 MG tablet Take 1 tablet (100 mg total) by mouth daily.  14 tablet  0  . traMADol (ULTRAM) 50 MG tablet Take 2 tablets (100 mg total) by mouth every 8 (eight) hours as needed for pain.  180 tablet  1  . vitamin C (ASCORBIC ACID) 500 MG tablet Take 500 mg by mouth daily.       No current facility-administered medications on file prior to visit.   Active Ambulatory Problems    Diagnosis Date Noted  . Back pain 07/21/2011  . HIV positive 08/03/2011  . Sleep disorder 08/03/2011  . Osteoporosis 03/09/2008  . Recurrent UTI 08/06/2011  . Piriformis syndrome of right side 09/17/2011  . History of tuberculosis exposure 10/01/2011  . Thrush, oral 10/01/2011  . Herpes simplex 10/07/2011  . Peripheral neuropathy 10/07/2011  . Hypercholesteremia 10/07/2011  . Lymphadenitis, acute 10/25/2011  . Bilateral lower extremity edema 12/10/2011  . Reflux 12/13/2011  . Dry skin 12/13/2011  . Lower GI bleed 05/10/2012  . Abdominal  pain, other specified site 05/10/2012  . Chronic constipation 05/18/2012   Resolved Ambulatory Problems    Diagnosis Date Noted  . No Resolved Ambulatory Problems   Past Medical History  Diagnosis Date  . HIV  (human immunodeficiency virus infection)   . Sciatica   . Ruptured lumbar disc   . Substance abuse   . TB (pulmonary tuberculosis) 1993   . Herniated disc 10/07/2011      Review of Systems 12 point ROS is negative    Objective:   Physical Exam BP 177/100  Pulse 67  Temp(Src) 98 F (36.7 C) (Oral)  Wt 178 lb (80.74 kg)  BMI 28.74 kg/m2 Physical Exam  Constitutional:  oriented to person, place, and time.  appears well-developed and well-nourished. No distress.  HENT:  Mouth/Throat: Oropharynx is clear and moist. No oropharyngeal exudate.  Cardiovascular: Normal rate, regular rhythm and normal heart sounds. Exam reveals no gallop and no friction rub.  No murmur heard.  Pulmonary/Chest: Effort normal and breath sounds normal. No respiratory distress. has no wheezes.  Lymphadenopathy: no cervical adenopathy.  Neurological:alert and oriented to person, place, and time.  Skin: Skin is warm and dry. No rash noted. No erythema.  Psychiatric:a normal mood and affect.  behavior is normal.       Assessment & Plan:  HIV= continue with atripla  Isolated elevated BP = though to be stressed. Taking time to relax. Check a few times at grocery store to see is still persistently elevated to warrant BP meds  Health maintenance = receive flu shot today.  HLD= mildly elevated TG, asked to do diet modificaiton

## 2013-01-06 ENCOUNTER — Ambulatory Visit (HOSPITAL_COMMUNITY): Payer: Medicare Other

## 2013-01-11 ENCOUNTER — Encounter: Payer: Self-pay | Admitting: Family Medicine

## 2013-01-11 ENCOUNTER — Ambulatory Visit (INDEPENDENT_AMBULATORY_CARE_PROVIDER_SITE_OTHER): Payer: Medicare Other | Admitting: Family Medicine

## 2013-01-11 VITALS — BP 133/84 | HR 82 | Temp 98.4°F | Ht 66.0 in | Wt 175.0 lb

## 2013-01-11 DIAGNOSIS — J209 Acute bronchitis, unspecified: Secondary | ICD-10-CM

## 2013-01-11 MED ORDER — BENZONATATE 100 MG PO CAPS: 100.0000 mg | ORAL_CAPSULE | Freq: Four times a day (QID) | ORAL | Status: DC | PRN

## 2013-01-11 MED ORDER — AZITHROMYCIN 250 MG PO TABS: 250.0000 mg | ORAL_TABLET | Freq: Every day | ORAL | Status: DC

## 2013-01-11 MED ORDER — ALBUTEROL SULFATE HFA 108 (90 BASE) MCG/ACT IN AERS: 2.0000 | INHALATION_SPRAY | Freq: Four times a day (QID) | RESPIRATORY_TRACT | Status: DC | PRN

## 2013-01-11 NOTE — Progress Notes (Signed)
  Subjective:    Patient ID: Courtney Hamilton, female    DOB: 1960/05/01, 52 y.o.   MRN: [id number]  Cough This is a new problem. The current episode started in the past 7 days. The problem has been gradually worsening. The cough is productive of sputum. Associated symptoms include chest pain, shortness of breath and sweats. Pertinent negatives include no chills, fever, nasal congestion or postnasal drip. The symptoms are aggravated by lying down. Risk factors for lung disease include smoking/tobacco exposure. She has tried OTC cough suppressant for the symptoms. The treatment provided mild relief. Her past medical history is significant for pneumonia. There is no history of asthma or COPD.   No improvement in 7 days with worsening symptoms.  No congestion of sinus symptoms.   Review of Systems  Constitutional: Negative for fever and chills.  HENT: Negative for postnasal drip.   Respiratory: Positive for cough and shortness of breath.   Cardiovascular: Positive for chest pain.       Objective:   Physical Exam  Vitals reviewed. Constitutional: She appears well-developed and well-nourished. No distress.  HENT:  Head: Normocephalic and atraumatic.  Nose: Right sinus exhibits no maxillary sinus tenderness and no frontal sinus tenderness. Left sinus exhibits no maxillary sinus tenderness and no frontal sinus tenderness.  Eyes: No scleral icterus.  Neck: Neck supple.  Cardiovascular: Normal rate and regular rhythm.   No murmur heard. Pulmonary/Chest: Effort normal. She has wheezes (clear with cough). She has rales.  Abdominal: Soft. There is no tenderness.  Lymphadenopathy:    She has no cervical adenopathy.          Assessment & Plan:

## 2013-01-11 NOTE — Patient Instructions (Signed)

## 2013-01-11 NOTE — Assessment & Plan Note (Signed)
Given her HIV status and breath sounds will go ahead and treat with Abx.  Addition of inhaler, short term and tessalon perles for cough.

## 2013-01-12 ENCOUNTER — Other Ambulatory Visit: Payer: Self-pay

## 2013-01-13 ENCOUNTER — Other Ambulatory Visit: Payer: Self-pay | Admitting: Sports Medicine

## 2013-01-13 ENCOUNTER — Telehealth: Payer: Self-pay | Admitting: Sports Medicine

## 2013-01-13 MED ORDER — DEXTROMETHORPHAN-GUAIFENESIN 20-400 MG PO TABS: 1.0000 | ORAL_TABLET | Freq: Two times a day (BID) | ORAL | Status: DC | PRN

## 2013-01-13 NOTE — Telephone Encounter (Signed)
Stop tessalon Perles. Start dextromethorphan/guaifenesin - rx sent in

## 2013-01-13 NOTE — Telephone Encounter (Signed)
Medication for cough is that was prescribed for patient when she was seen on 11/5 is causing her to have headaches.  Wanted to find out if anything else can be prescribed for her.  Please call back asap.

## 2013-01-16 ENCOUNTER — Ambulatory Visit (INDEPENDENT_AMBULATORY_CARE_PROVIDER_SITE_OTHER): Payer: Medicare Other | Admitting: *Deleted

## 2013-01-16 ENCOUNTER — Encounter: Payer: Self-pay | Admitting: Family Medicine

## 2013-01-16 ENCOUNTER — Ambulatory Visit (INDEPENDENT_AMBULATORY_CARE_PROVIDER_SITE_OTHER): Payer: Medicare Other | Admitting: Family Medicine

## 2013-01-16 ENCOUNTER — Other Ambulatory Visit (HOSPITAL_COMMUNITY)
Admission: RE | Admit: 2013-01-16 | Discharge: 2013-01-16 | Disposition: A | Discharge status: Home / Self Care | Payer: Medicare Other | Source: Ambulatory Visit | Attending: Infectious Diseases | Admitting: Infectious Diseases

## 2013-01-16 VITALS — BP 125/94 | HR 68 | Temp 100.0°F | Ht 66.0 in | Wt 197.7 lb

## 2013-01-16 DIAGNOSIS — Z124 Encounter for screening for malignant neoplasm of cervix: Secondary | ICD-10-CM

## 2013-01-16 DIAGNOSIS — M62830 Muscle spasm of back: Secondary | ICD-10-CM | POA: Insufficient documentation

## 2013-01-16 DIAGNOSIS — J209 Acute bronchitis, unspecified: Secondary | ICD-10-CM

## 2013-01-16 DIAGNOSIS — M5380 Other specified dorsopathies, site unspecified: Secondary | ICD-10-CM

## 2013-01-16 MED ORDER — GUAIFENESIN-CODEINE 100-10 MG/5ML PO SYRP: 5.0000 mL | ORAL_SOLUTION | Freq: Three times a day (TID) | ORAL | Status: DC | PRN

## 2013-01-16 MED ORDER — CYCLOBENZAPRINE HCL 10 MG PO TABS: 10.0000 mg | ORAL_TABLET | Freq: Every day | ORAL | Status: DC

## 2013-01-16 NOTE — Patient Instructions (Addendum)
Take the muscle relaxer at night to help with pain relief.    The antibiotic will continue to work for the next 5 days.  Use the cough syrup up to three times a day.  Don't drive with this medicine.  It was good to see you today!

## 2013-01-16 NOTE — Patient Instructions (Signed)
Your results will be ready in about a week.  Your may look them up on MyChart.  Thank you for coming to the Center for your care.  I hope you get to feeling better.  Angelique Blonder, RN

## 2013-01-16 NOTE — Progress Notes (Signed)
  Subjective:     Courtney Hamilton is a 52 y.o. woman who comes in today for a  pap smear only. . Previous abnormal Pap smears: yes. Contraception: menopausal  Objective:    There were no vitals taken for this visit. Pelvic Exam:  Pap smear obtained.   Assessment:    Screening pap smear.   Plan:    Follow up in year, or as indicated by Pap results.  Pt given educational materials re: HIV and women, self-esteem, nutrition and diet management, PAP smears and partner safety. Pt given condoms.

## 2013-01-16 NOTE — Progress Notes (Signed)
Subjective:    Courtney Hamilton is a 52 y.o. female who presents to [facility] today for back pain and cough:  1.  Cough and back pain:  Patient seen here last week, diagnosed with bronchitis.  She has since been improving, but still with persistent cough.  Starting on Saturday, she had strong coughing fit and feels that she "pulled a muscle" in her lower back.  Has tried Tylenol without any relief.  Called her Pain Management physician but she is not scheduled to see them until later this month.  Difficulty sleeping at night due to cough.  Is trying to actively suppress cough b/c of back pain when coughing.  Cough occasionally productive of thick sputum.  No palpitations or chest pain.  No dyspnea.     The following portions of the patient's history were reviewed and updated as appropriate: allergies, current medications, past medical history, family and social history, and problem list. Patient is a nonsmoker.    PMH reviewed.  Past Medical History  Diagnosis Date  . HIV (human immunodeficiency virus infection)   . Sciatica   . Ruptured lumbar disc   . Osteoporosis   . 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 TB (pulmonary tuberculosis) 1993     exposure, treated   . Herniated disc 10/07/2011   Past Surgical History  Procedure Laterality Date  . Cholecystectomy    . Appendectomy    . Ectopic pregnancy surgery    . Salivary gland surgery      Medications reviewed. Current Outpatient Prescriptions  Medication Sig Dispense Refill  . albuterol (PROVENTIL HFA;VENTOLIN HFA) 108 (90 BASE) MCG/ACT inhaler Inhale 2 puffs into the lungs every 6 (six) hours as needed for wheezing or shortness of breath.  1 Inhaler  0  . azithromycin (ZITHROMAX) 250 MG tablet Take 1 tablet (250 mg total) by mouth daily. Take 2 on first day  6 tablet  0  . Calcium-Magnesium-Zinc 167-83-8 MG TABS Take 1 tablet by mouth daily.      . clotrimazole (MYCELEX) 10 MG troche       . cyclobenzaprine  (FLEXERIL) 10 MG tablet Take 1 tablet (10 mg total) by mouth at bedtime.  30 tablet  0  . Dextromethorphan-Guaifenesin 20-400 MG TABS Take 1-2 tablets by mouth every 12 (twelve) hours as needed.  30 each  0  . efavirenz-emtricitabine-tenofovir (ATRIPLA) 600-200-300 MG per tablet Take 1 tablet by mouth at bedtime.  30 tablet  11  . fluconazole (DIFLUCAN) 100 MG tablet Take 1 tablet (100 mg total) by mouth daily.  14 tablet  0  . guaiFENesin-codeine (ROBITUSSIN AC) 100-10 MG/5ML syrup Take 5 mLs by mouth 3 (three) times daily as needed for cough.  120 mL  0  . traMADol (ULTRAM) 50 MG tablet Take 2 tablets (100 mg total) by mouth every 8 (eight) hours as needed for pain.  180 tablet  1  . vitamin C (ASCORBIC ACID) 500 MG tablet Take 500 mg by mouth daily.       No current facility-administered medications for this visit.    ROS as above otherwise neg.    Objective:   Physical Exam BP 125/94  Pulse 68  Temp(Src) 100 F (37.8 C)  Ht 5\' 6"  (1.676 m)  Wt 197 lb 11.2 oz (89.676 kg)  BMI 31.92 kg/m2 Gen:  Patient sitting on exam table, appears stated age in no acute distress Head: Normocephalic atraumatic Eyes: EOMI, PERRL,  sclera and conjunctiva non-erythematous Ears:  Canals clear bilaterally.  TMs pearly gray bilaterally without erythema or bulging.   Mouth: Mucosa membranes moist. Tonsils +2, nonenlarged, non-erythematous. Neck: No cervical lymphadenopathy noted Heart:  RRR, no murmurs auscultated. Pulm:  No wheezing on exam today.   Back:  TTP along Left side of lumbar region.  Some spasm noted.  Strength 5/5 BL LE's, DTRs +2 BL patellar  .   No results found for this or any previous visit (from the past 72 hour(s)).

## 2013-01-16 NOTE — Assessment & Plan Note (Signed)
Treat with codeine as pain reliever and cough suppresant plus flexeril for muscle spasm Has started using heating pad -- to continue with this.

## 2013-01-16 NOTE — Telephone Encounter (Signed)
Spoke with patient and informed her of below and she has an appointment to day also that she still wants to come in for

## 2013-01-16 NOTE — Assessment & Plan Note (Signed)
Now with back spasm.  No need to further treat bronchitis, still in 10 day window for azithromycin. Cough suppressant today.

## 2013-01-20 ENCOUNTER — Encounter: Payer: Self-pay | Admitting: Physical Medicine and Rehabilitation

## 2013-01-20 ENCOUNTER — Telehealth: Payer: Self-pay

## 2013-01-20 ENCOUNTER — Encounter
Payer: Medicare Other | Attending: Physical Medicine and Rehabilitation | Admitting: Physical Medicine and Rehabilitation

## 2013-01-20 VITALS — BP 118/84 | HR 83 | Resp 14 | Ht 66.0 in | Wt 176.0 lb

## 2013-01-20 DIAGNOSIS — M25569 Pain in unspecified knee: Secondary | ICD-10-CM | POA: Insufficient documentation

## 2013-01-20 DIAGNOSIS — M25539 Pain in unspecified wrist: Secondary | ICD-10-CM | POA: Insufficient documentation

## 2013-01-20 DIAGNOSIS — M47817 Spondylosis without myelopathy or radiculopathy, lumbosacral region: Secondary | ICD-10-CM

## 2013-01-20 MED ORDER — TRAMADOL HCL 50 MG PO TABS: 100.0000 mg | ORAL_TABLET | Freq: Three times a day (TID) | ORAL | Status: DC | PRN

## 2013-01-20 NOTE — Patient Instructions (Signed)
Try to stay as active as tolerated 

## 2013-01-20 NOTE — Progress Notes (Signed)
Subjective:    Patient ID: Courtney Hamilton, female    DOB: 1960-03-30, 52 y.o.   MRN: [id number]  HPI The patient complains about chronic low back pain which radiates into her lateral thighs bilateral.  The patient denies any numbness and tingling. She states that her back pain has improved after the last injection she received.She grades her pain as a 10/10, because she did not have any of her medication for over a week. The problem has been stable otherwise.  Pain Inventory Average Pain 7 Pain Right Now 10 My pain is constant and sharp  In the last 24 hours, has pain interfered with the following? General activity 10 Relation with others 10 Enjoyment of life 10 What TIME of day is your pain at its worst? evening Sleep (in general) Poor  Pain is worse with: inactivity, standing and some activites Pain improves with: medication Relief from Meds: 10  Mobility walk without assistance how many minutes can you walk? 10  Function disabled: date disabled 2007  Neuro/Psych No problems in this area  Prior Studies Any changes since last visit?  no  Physicians involved in your care Any changes since last visit?  no   Family History  Problem Relation Age of Onset  . Asthma Father   . Diabetes Brother   . Hypertension Brother    History   Social History  . Marital Status: Single    Spouse Name: N/A    Number of Children: N/A  . Years of Education: N/A   Social History Main Topics  . Smoking status: Current Every Day Smoker -- 1.00 packs/day for 30 years    Types: Cigarettes  . Smokeless tobacco: Never Used  . Alcohol Use: No  . Drug Use: No     Comment: past history of cocaine  and alcohol    . Sexual Activity: No     Comment: declined condoms    Other Topics Concern  . None   Social History Narrative  . None   Past Surgical History  Procedure Laterality Date  . Cholecystectomy    . Appendectomy    . Ectopic pregnancy surgery    . Salivary gland surgery      Past Medical History  Diagnosis Date  . HIV (human immunodeficiency virus infection)   . Sciatica   . Ruptured lumbar disc   . Osteoporosis   . 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 TB (pulmonary tuberculosis) 1993     exposure, treated   . Herniated disc 10/07/2011   WT 176lb  HT 66"  BP 118/84  P 83  R 14  O2 sat 99%  Review of Systems  Constitutional: Positive for diaphoresis.  Respiratory:       Respiratory infections  Musculoskeletal: Positive for back pain.  All other systems reviewed and are negative.       Objective:   Physical Exam  Constitutional: She is oriented to person, place, and time. She appears well-developed and well-nourished.  HENT:  Head: Normocephalic.  Neck: Neck supple.  Musculoskeletal: She exhibits tenderness.  Neurological: She is alert and oriented to person, place, and time.  Skin: Skin is warm and dry.  Psychiatric: She has a normal mood and affect.  Symmetric normal motor tone is noted throughout. Normal muscle bulk. Muscle testing reveals 5/5 muscle strength of the upper extremity, and 5/5 of the lower extremity.  ROM of spine is  restricted. Fine motor  movements are normal in both hands.Mild effusion in right wrist  DTR in the upper and lower extremity are present and symmetric 2+. No clonus is noted.  Patient arises from chair without difficulty. Narrow based gait with normal arm swing bilateral .        Assessment & Plan:  1. Lumbar spondylosis on imaging studies, today the patient told me that the injections have helped, but in Dr. Jodean Lima last note he stated that she had no response to medial branch blocks. Continue tramadol 100 mg 3 times a day, new Rx given  2. Right wrist carpal metacarpal pain she is using a spica splint . Recommended to use the diclofenac gel qid 3. Right knee pain improved with diclofenac gel .  Follow up in 3 month

## 2013-01-20 NOTE — Telephone Encounter (Signed)
Patient called requesting tramadol refill.  Patient has not been seen so she has made an appointment.

## 2013-01-24 ENCOUNTER — Ambulatory Visit: Payer: Medicare Other | Admitting: Physical Medicine & Rehabilitation

## 2013-01-24 ENCOUNTER — Encounter: Payer: Self-pay | Admitting: *Deleted

## 2013-02-12 ENCOUNTER — Other Ambulatory Visit: Payer: Self-pay | Admitting: Family Medicine

## 2013-03-13 ENCOUNTER — Other Ambulatory Visit: Payer: Self-pay | Admitting: Sports Medicine

## 2013-03-30 ENCOUNTER — Ambulatory Visit: Payer: Medicare Other | Admitting: Internal Medicine

## 2013-04-12 ENCOUNTER — Other Ambulatory Visit: Payer: Self-pay | Admitting: *Deleted

## 2013-04-12 ENCOUNTER — Other Ambulatory Visit: Payer: Self-pay | Admitting: Sports Medicine

## 2013-04-12 ENCOUNTER — Other Ambulatory Visit: Payer: Medicare Other

## 2013-04-12 DIAGNOSIS — B20 Human immunodeficiency virus [HIV] disease: Secondary | ICD-10-CM

## 2013-04-12 LAB — CBC WITH DIFFERENTIAL/PLATELET
Basophils Absolute: 0.1 10*3/uL (ref 0.0–0.1)
Basophils Relative: 1 % (ref 0–1)
EOS ABS: 0.3 10*3/uL (ref 0.0–0.7)
Eosinophils Relative: 5 % (ref 0–5)
HCT: 40.8 % (ref 36.0–46.0)
HEMOGLOBIN: 13.8 g/dL (ref 12.0–15.0)
LYMPHS ABS: 2.5 10*3/uL (ref 0.7–4.0)
Lymphocytes Relative: 36 % (ref 12–46)
MCH: 29 pg (ref 26.0–34.0)
MCHC: 33.8 g/dL (ref 30.0–36.0)
MCV: 85.7 fL (ref 78.0–100.0)
MONO ABS: 0.6 10*3/uL (ref 0.1–1.0)
Monocytes Relative: 8 % (ref 3–12)
NEUTROS PCT: 50 % (ref 43–77)
Neutro Abs: 3.4 10*3/uL (ref 1.7–7.7)
Platelets: 295 10*3/uL (ref 150–400)
RBC: 4.76 MIL/uL (ref 3.87–5.11)
RDW: 14.1 % (ref 11.5–15.5)
WBC: 6.9 10*3/uL (ref 4.0–10.5)

## 2013-04-12 LAB — COMPLETE METABOLIC PANEL WITHOUT GFR
ALT: <8 U/L (ref 0–35)
AST: 13 U/L (ref 0–37)
Albumin: 4.5 g/dL (ref 3.5–5.2)
Alkaline Phosphatase: 139 U/L — ABNORMAL HIGH (ref 39–117)
BUN: 13 mg/dL (ref 6–23)
CO2: 25 meq/L (ref 19–32)
Calcium: 9.5 mg/dL (ref 8.4–10.5)
Chloride: 102 meq/L (ref 96–112)
Creat: 0.71 mg/dL (ref 0.50–1.10)
GFR, Est African American: >89 mL/min
GFR, Est Non African American: >89 mL/min
Glucose, Bld: 151 mg/dL — ABNORMAL HIGH (ref 70–99)
Potassium: 4.1 meq/L (ref 3.5–5.3)
Sodium: 137 meq/L (ref 135–145)
Total Bilirubin: 0.3 mg/dL (ref 0.2–1.2)
Total Protein: 7.7 g/dL (ref 6.0–8.3)

## 2013-04-13 LAB — T-HELPER CELL (CD4) - (RCID CLINIC ONLY)
CD4 % Helper T Cell: 8 % — ABNORMAL LOW (ref 33–55)
CD4 T Cell Abs: 200 /uL — ABNORMAL LOW (ref 400–2700)

## 2013-04-14 LAB — HIV-1 RNA QUANT-NO REFLEX-BLD
HIV 1 RNA Quant: <20 {copies}/mL
HIV-1 RNA Quant, Log: <1.3 {Log}

## 2013-04-18 ENCOUNTER — Encounter: Payer: Medicare Other | Attending: Physical Medicine and Rehabilitation

## 2013-04-18 ENCOUNTER — Encounter: Payer: Self-pay | Admitting: Physical Medicine & Rehabilitation

## 2013-04-18 ENCOUNTER — Ambulatory Visit (HOSPITAL_BASED_OUTPATIENT_CLINIC_OR_DEPARTMENT_OTHER): Payer: Medicare Other | Admitting: Physical Medicine & Rehabilitation

## 2013-04-18 VITALS — BP 120/74 | HR 101 | Resp 14 | Ht 66.0 in | Wt 177.0 lb

## 2013-04-18 DIAGNOSIS — M47817 Spondylosis without myelopathy or radiculopathy, lumbosacral region: Secondary | ICD-10-CM | POA: Insufficient documentation

## 2013-04-18 DIAGNOSIS — M189 Osteoarthritis of first carpometacarpal joint, unspecified: Secondary | ICD-10-CM | POA: Insufficient documentation

## 2013-04-18 DIAGNOSIS — M19049 Primary osteoarthritis, unspecified hand: Secondary | ICD-10-CM

## 2013-04-18 DIAGNOSIS — M25569 Pain in unspecified knee: Secondary | ICD-10-CM | POA: Insufficient documentation

## 2013-04-18 DIAGNOSIS — M25539 Pain in unspecified wrist: Secondary | ICD-10-CM | POA: Insufficient documentation

## 2013-04-18 MED ORDER — TRAMADOL HCL 50 MG PO TABS: 100.0000 mg | ORAL_TABLET | Freq: Three times a day (TID) | ORAL | Status: DC | PRN

## 2013-04-18 NOTE — Patient Instructions (Addendum)
Where the wrist splint 24 hours a day for 1-2 weeks. May use the diclofenac gel 4 times a day as long as she lets it try prior to applying the splint  Next visit will be for back injection

## 2013-04-18 NOTE — Progress Notes (Signed)
Subjective:    Patient ID: Courtney Hamilton, female    DOB: 11-Jun-1960, 53 y.o.   MRN: [id number]  HPI Medial branch blocks performed L3 L4-L5 on 07/07/2012. Improved activity level after that. Starting to have increasing pain as of November 2014.  Right base of thumb pain, used tumb spica at noc, uses voltaren gel, flared up after crochet  Sciatic pain  Pain Inventory Average Pain 8 Pain Right Now 10 My pain is sharp and burning  In the last 24 hours, has pain interfered with the following? General activity 9 Relation with others 9 Enjoyment of life 9 What TIME of day is your pain at its worst? daytime Sleep (in general) Fair  Pain is worse with: bending, sitting and standing Pain improves with: rest and medication Relief from Meds: 9  Mobility how many minutes can you walk? 10 ability to climb steps?  yes do you drive?  yes Do you have any goals in this area?  yes  Function disabled: date disabled 2007 I need assistance with the following:  household duties and shopping  Neuro/Psych No problems in this area  Prior Studies Any changes since last visit?  no  Physicians involved in your care Any changes since last visit?  no   Family History  Problem Relation Age of Onset  . Asthma Father   . Diabetes Brother   . Hypertension Brother    History   Social History  . Marital Status: Single    Spouse Name: N/A    Number of Children: N/A  . Years of Education: N/A   Social History Main Topics  . Smoking status: Current Every Day Smoker -- 1.00 packs/day for 30 years    Types: Cigarettes  . Smokeless tobacco: Never Used  . Alcohol Use: No  . Drug Use: No     Comment: past history of cocaine  and alcohol    . Sexual Activity: No     Comment: declined condoms    Other Topics Concern  . None   Social History Narrative  . None   Past Surgical History  Procedure Laterality Date  . Cholecystectomy    . Appendectomy    . Ectopic pregnancy surgery    .  Salivary gland surgery     Past Medical History  Diagnosis Date  . HIV (human immunodeficiency virus infection)   . Sciatica   . Ruptured lumbar disc   . Osteoporosis   . 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 TB (pulmonary tuberculosis) 1993     exposure, treated   . Herniated disc 10/07/2011   BP 120/74  Pulse 101  Resp 14  Ht 5\' 6"  (1.676 m)  Wt 177 lb (80.287 kg)  BMI 28.58 kg/m2  SpO2 100%  Opioid Risk Score:   Fall Risk Score: Low Fall Risk (0-5 points) (patient educated handout given)   Review of Systems  Constitutional: Positive for diaphoresis.  Musculoskeletal: Positive for back pain.  All other systems reviewed and are negative.       Objective:   Physical Exam Tenderness to palpation left lumbar paraspinal. Lumbar range of motion 50% flexion, extension, lateral rotation Tenderness over the right carpal metacarpal joint. Positive FinkleStein's test Positive straight leg raise on the left side. Strength is normal in both hip flexors knee extensors ankle dorsiflexors.      Assessment & Plan:  1. Lumbar spondylosis on imaging studies good and prolonged response  to medial branch blocks. . Recommend physical therapy. Hold off on any or the spine injections. Continue tramadol 100 mg 3 times a day  2. Right wrist carpal metacarpal pain may have concommitant tenosynovitis recommend spica splint 24 hours a day for 1-2 weeks. Reassess in 4-6 weeks. May use diclofenac gel  3. Right knee pain improved with diclofenac gel .No signs of inflammation. No recent trauma. May use ice 20-30 minutes after exercise activities or prolonged standing

## 2013-04-21 ENCOUNTER — Ambulatory Visit: Payer: Medicare Other | Admitting: Family Medicine

## 2013-04-27 ENCOUNTER — Ambulatory Visit (INDEPENDENT_AMBULATORY_CARE_PROVIDER_SITE_OTHER): Payer: Medicare Other | Admitting: Internal Medicine

## 2013-04-27 ENCOUNTER — Encounter: Payer: Self-pay | Admitting: Internal Medicine

## 2013-04-27 VITALS — BP 128/80 | HR 77 | Temp 98.4°F | Wt 178.0 lb

## 2013-04-27 DIAGNOSIS — B977 Papillomavirus as the cause of diseases classified elsewhere: Secondary | ICD-10-CM

## 2013-04-27 DIAGNOSIS — Z0000 Encounter for general adult medical examination without abnormal findings: Secondary | ICD-10-CM

## 2013-04-27 DIAGNOSIS — Z1231 Encounter for screening mammogram for malignant neoplasm of breast: Secondary | ICD-10-CM

## 2013-04-27 DIAGNOSIS — C4490 Unspecified malignant neoplasm of skin, unspecified: Secondary | ICD-10-CM

## 2013-04-27 DIAGNOSIS — A630 Anogenital (venereal) warts: Secondary | ICD-10-CM

## 2013-04-27 MED ORDER — IMIQUIMOD 5 % EX CREA: TOPICAL_CREAM | CUTANEOUS | Status: DC

## 2013-04-27 NOTE — Progress Notes (Signed)
Subjective:    Patient ID: Courtney Hamilton, female    DOB: [birth year], 53 y.o.   MRN: [id number]  HPI 53yo F with HIV, Cd 4 count of 200/VL<20, on atripla, not missing doses. Reports having a fog but then also has been caring for her grandkids and daughter. She feels the fog just after taking the medication mostly. They have just left a week ago. Pretty hectic at her house due to caring for daughter, and newborn. She states that sciatica started up affecting both legs. Mentioned to previous physician, her pain management doctor who follows lumbar spondylosis, right wrist carpal tunnel. She states that she took alleve BID and tramadol yesterday that hasn't helped with sciatica.   Current Outpatient Prescriptions on File Prior to Visit  Medication Sig Dispense Refill  . albuterol (PROVENTIL HFA;VENTOLIN HFA) 108 (90 BASE) MCG/ACT inhaler Inhale 2 puffs into the lungs every 6 (six) hours as needed for wheezing or shortness of breath.  1 Inhaler  0  . Calcium-Magnesium-Zinc 167-83-8 MG TABS Take 1 tablet by mouth daily.      . clotrimazole (MYCELEX) 10 MG troche       . cyclobenzaprine (FLEXERIL) 10 MG tablet Take 1 tablet (10 mg total) by mouth 3 (three) times daily as needed for muscle spasms.  30 tablet  5  . efavirenz-emtricitabine-tenofovir (ATRIPLA) 600-200-300 MG per tablet Take 1 tablet by mouth at bedtime.  30 tablet  11  . fluconazole (DIFLUCAN) 100 MG tablet Take 1 tablet (100 mg total) by mouth daily.  14 tablet  0  . traMADol (ULTRAM) 50 MG tablet Take 2 tablets (100 mg total) by mouth every 8 (eight) hours as needed.  180 tablet  3  . vitamin C (ASCORBIC ACID) 500 MG tablet Take 500 mg by mouth daily.       No current facility-administered medications on file prior to visit.   Active Ambulatory Problems    Diagnosis Date Noted  . Back pain 07/21/2011  . HIV positive 08/03/2011  . Sleep disorder 08/03/2011  . Osteoporosis 03/09/2008  . Recurrent UTI 08/06/2011  . Piriformis  syndrome of right side 09/17/2011  . History of tuberculosis exposure 10/01/2011  . Thrush, oral 10/01/2011  . Herpes simplex 10/07/2011  . Peripheral neuropathy 10/07/2011  . Hypercholesteremia 10/07/2011  . Lymphadenitis, acute 10/25/2011  . Bilateral lower extremity edema 12/10/2011  . Reflux 12/13/2011  . Dry skin 12/13/2011  . Lower GI bleed 05/10/2012  . Abdominal  pain, other specified site 05/10/2012  . Chronic constipation 05/18/2012  . Acute bronchitis 01/11/2013  . Lumbar paraspinal muscle spasm 01/16/2013  . 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 No Resolved Ambulatory Problems   Past Medical History  Diagnosis Date  . HIV (human immunodeficiency virus infection)   . Sciatica   . Ruptured lumbar disc   . Substance abuse   . TB (pulmonary tuberculosis) 1993   . Herniated disc 10/07/2011       Review of Systems  Constitutional: Negative for fever, chills, diaphoresis, activity change, appetite change, fatigue and unexpected weight change.  HENT: Negative for congestion, sore throat, rhinorrhea, sneezing, trouble swallowing and sinus pressure.  Eyes: Negative for photophobia and visual disturbance.  Respiratory: Negative for cough, chest tightness, shortness of breath, wheezing and stridor.  Cardiovascular: Negative for chest pain, palpitations and leg swelling.  Gastrointestinal: Negative for nausea, vomiting, abdominal pain, diarrhea, constipation, blood in stool, abdominal  distention and anal bleeding.  Genitourinary: Negative for dysuria, hematuria, flank pain and difficulty urinating.  Musculoskeletal: back/leg pain due to sciatica Skin: Negative for color change, pallor, rash and wound.  Neurological: Negative for dizziness, tremors, weakness and light-headedness.  Hematological: Negative for adenopathy. Does not bruise/bleed easily.  Psychiatric/Behavioral: Negative for behavioral  problems, confusion, sleep disturbance, dysphoric mood, decreased concentration and agitation.       Objective:   Physical Exam BP 128/80  Pulse 77  Temp(Src) 98.4 F (36.9 C) (Oral)  Wt 178 lb (80.74 kg) Physical Exam  Constitutional:  oriented to person, place, and time. appears well-developed and well-nourished. No distress.  HENT:  Mouth/Throat: Oropharynx is clear and moist. No oropharyngeal exudate.  Cardiovascular: Normal rate, regular rhythm and normal heart sounds. Exam reveals no gallop and no friction rub.  No murmur heard.  Pulmonary/Chest: Effort normal and breath sounds normal. No respiratory distress.  has no wheezes.  Abdominal: Soft. Bowel sounds are normal.  exhibits no distension. There is no tenderness.  Lymphadenopathy: no cervical adenopathy.  Neurological: alert and oriented to person, place, and time.  Skin: Skin is warm and dry. No rash noted. No erythema.  Psychiatric: a normal mood and affect. behavior is normal.      Assessment & Plan:  HIV disease= well controlled.   atripla side effect = wants to wait in changing regimen. She believes it maybe compounded by recent situational fatigue.  Sciatica = continue with alleve bid x 7 days  Health maintenance = needs to get mammo, last done in 2011  Pain management = follow up with Dr. Delmer Islam   rtc in 6 wk, in early april

## 2013-05-09 ENCOUNTER — Ambulatory Visit (HOSPITAL_COMMUNITY)
Admission: RE | Admit: 2013-05-09 | Discharge: 2013-05-09 | Disposition: A | Discharge status: Home / Self Care | Payer: Medicare Other | Source: Ambulatory Visit | Attending: Internal Medicine | Admitting: Internal Medicine

## 2013-05-09 DIAGNOSIS — Z0000 Encounter for general adult medical examination without abnormal findings: Secondary | ICD-10-CM

## 2013-05-09 DIAGNOSIS — Z1231 Encounter for screening mammogram for malignant neoplasm of breast: Secondary | ICD-10-CM | POA: Insufficient documentation

## 2013-05-18 ENCOUNTER — Other Ambulatory Visit: Payer: Self-pay | Admitting: Internal Medicine

## 2013-05-18 DIAGNOSIS — R928 Other abnormal and inconclusive findings on diagnostic imaging of breast: Secondary | ICD-10-CM

## 2013-05-30 ENCOUNTER — Ambulatory Visit
Admission: RE | Admit: 2013-05-30 | Discharge: 2013-05-30 | Disposition: A | Discharge status: Home / Self Care | Payer: Medicare Other | Source: Ambulatory Visit | Attending: Internal Medicine | Admitting: Internal Medicine

## 2013-05-30 DIAGNOSIS — R928 Other abnormal and inconclusive findings on diagnostic imaging of breast: Secondary | ICD-10-CM

## 2013-06-01 ENCOUNTER — Telehealth: Payer: Self-pay | Admitting: Physical Medicine & Rehabilitation

## 2013-06-01 NOTE — Telephone Encounter (Signed)
Patient called about getting injections in her back.  Please let us know if we can schedule and injection and what kind would she need to get.

## 2013-06-01 NOTE — Telephone Encounter (Signed)
Bilateral L3 L4-L5 medial branch blocks

## 2013-06-15 ENCOUNTER — Ambulatory Visit: Payer: Medicare Other | Admitting: Internal Medicine

## 2013-06-16 NOTE — Patient Instructions (Addendum)

## 2013-06-23 ENCOUNTER — Encounter: Payer: Self-pay | Admitting: Family Medicine

## 2013-06-23 ENCOUNTER — Ambulatory Visit (INDEPENDENT_AMBULATORY_CARE_PROVIDER_SITE_OTHER): Payer: Medicare Other | Admitting: Family Medicine

## 2013-06-23 VITALS — BP 123/78 | HR 64 | Temp 98.1°F | Ht 66.0 in | Wt 179.0 lb

## 2013-06-23 DIAGNOSIS — J4489 Other specified chronic obstructive pulmonary disease: Secondary | ICD-10-CM | POA: Insufficient documentation

## 2013-06-23 DIAGNOSIS — R058 Other specified cough: Secondary | ICD-10-CM | POA: Insufficient documentation

## 2013-06-23 DIAGNOSIS — R05 Cough: Secondary | ICD-10-CM | POA: Insufficient documentation

## 2013-06-23 DIAGNOSIS — R059 Cough, unspecified: Secondary | ICD-10-CM

## 2013-06-23 MED ORDER — PROMETHAZINE-PHENYLEPHRINE 6.25-5 MG/5ML PO SYRP: 5.0000 mL | ORAL_SOLUTION | Freq: Four times a day (QID) | ORAL | Status: DC | PRN

## 2013-06-23 MED ORDER — AZITHROMYCIN 250 MG PO TABS: ORAL_TABLET | ORAL | Status: DC

## 2013-06-23 NOTE — Assessment & Plan Note (Signed)
>>  ASSESSMENT AND PLAN FOR PRODUCTIVE COUGH WRITTEN ON 06/23/2013  2:05 PM BY HAIRFORD, AMBER M, MD  A: Cough x 1 week with diffuse wheezing. She is a smoker with immunosuppression, so will treat as bronchitis   P: - CXR today to evaluate for other causes - Azithro x5 days - Phenergan DM for congestion (other controlled substances would interfere with pain contract) - F/u if fails to improve or gets worse.

## 2013-06-23 NOTE — Patient Instructions (Signed)
I will call you if your chest X-ray shows anything other than bronchitis.  Take the antibiotic for the next 5 days. Use the cough medication as needed, but it will make you sleepy.  Please follow up if you are not feeling better.  Clements Toro M. Borghild Thaker, M.D.

## 2013-06-23 NOTE — Progress Notes (Signed)
Patient ID: Courtney Hamilton, female   DOB: [birth year], 53 y.o.   MRN: [id number]    Subjective: HPI: Patient is a 53 y.o. female presenting to clinic today for same day appointment.  Cough Patient complains of nasal congestion and productive cough with sputum described as white. Symptoms began 1 week ago. Symptoms have been unchanged since that time.The cough is hoarse and productive and is aggravated by reclining position. Associated symptoms include: change in voice, shortness of breath and wheezing. Patient does not have new pets. Patient does not have a history of asthma. Patient does not have a history of environmental allergens. Patient has not traveled recently. Patient does have a history of smoking. Patient has had a PPD done.  Started 6 days ago as a burning cough. Tried Mucinex. Nighttime is the worst.  History Reviewed: Currently smoker, quit yesterday. Health Maintenance: UTD  ROS: Please see HPI above.  Objective: Office vital signs reviewed. BP 123/78  Pulse 64  Temp(Src) 98.1 F (36.7 C) (Oral)  Ht 5\' 6"  (1.676 m)  Wt 179 lb (81.194 kg)  BMI 28.91 kg/m2  Physical Examination:  General: Awake, alert. NAD HEENT: Atraumatic, normocephalic. MMM. Posterior pharynx erythematous. Neck: No masses palpated. No LAD Pulm: Persistent cough. Diffuse inspiratory wheezing, and ?rales at the right base. Cardio: RRR, no murmurs appreciated Abdomen:+BS, soft, nontender, nondistended Neuro: Strength and sensation grossly intact  Assessment: 53 y.o. female with cough  Plan: See Problem List and After Visit Summary

## 2013-06-23 NOTE — Assessment & Plan Note (Signed)
A: Cough x 1 week with diffuse wheezing. She is a smoker with immunosuppression, so will treat as bronchitis   P: - CXR today to evaluate for other causes - Azithro x5 days - Phenergan DM for congestion (other controlled substances would interfere with pain contract) - F/u if fails to improve or gets worse.

## 2013-07-13 ENCOUNTER — Encounter: Payer: Medicare Other | Attending: Physical Medicine and Rehabilitation

## 2013-07-13 ENCOUNTER — Encounter: Payer: Self-pay | Admitting: Physical Medicine & Rehabilitation

## 2013-07-13 ENCOUNTER — Ambulatory Visit (HOSPITAL_BASED_OUTPATIENT_CLINIC_OR_DEPARTMENT_OTHER): Payer: Medicare Other | Admitting: Physical Medicine & Rehabilitation

## 2013-07-13 VITALS — BP 116/66 | HR 72 | Resp 14 | Ht 66.0 in | Wt 177.0 lb

## 2013-07-13 DIAGNOSIS — M47817 Spondylosis without myelopathy or radiculopathy, lumbosacral region: Secondary | ICD-10-CM

## 2013-07-13 DIAGNOSIS — M25569 Pain in unspecified knee: Secondary | ICD-10-CM | POA: Insufficient documentation

## 2013-07-13 DIAGNOSIS — M25539 Pain in unspecified wrist: Secondary | ICD-10-CM | POA: Insufficient documentation

## 2013-07-13 MED ORDER — TRAMADOL HCL 50 MG PO TABS: 100.0000 mg | ORAL_TABLET | Freq: Three times a day (TID) | ORAL | Status: DC | PRN

## 2013-07-13 NOTE — Progress Notes (Signed)
Bilateral Lumbar L3, L4  medial branch blocks and L 5 dorsal ramus injection under fluoroscopic guidance  Indication: Lumbar pain which is not relieved by medication management or other conservative care and interfering with self-care and mobility.  History of HIV, received okay from infectious disease specialist to proceed with injection. Last CD4 count was 214 last month  Informed consent was obtained after describing risks and benefits of the procedure with the patient, this includes bleeding, infection, paralysis and medication side effects.  The patient wishes to proceed and has given written consent.  The patient was placed in prone position.  The lumbar area was marked and prepped with Betadine.  One mL of 1% lidocaine was injected into each of 6 areas into the skin and subcutaneous tissue.  Then a 22-gauge 3.5 inch spinal needle was inserted targeting the junction of the left S1 superior articular process and sacral ala junction. Needle was advanced under fluoroscopic guidance.  Bone contact was made.  Omnipaque 180 was injected x 0.5 mL demonstrating no intravascular uptake.  Then a solution containing one mL of 4 mg per mL dexamethasone and 3 mL of 2% MPF lidocaine was injected x 0.5 mL.  Then the left L5 superior articular process in transverse process junction was targeted.  Bone contact was made.  Omnipaque 180 was injected x 0.5 mL demonstrating no intravascular uptake. Then a solution containing one mL of 4 mg per mL dexamethasone and 3 mL of 2% MPF lidocaine was injected x 0.5 mL.  Then the left L4 superior articular process in transverse process junction was targeted.  Bone contact was made.  Omnipaque 180 was injected x 0.5 mL demonstrating no intravascular uptake.  Then a solution containing one mL of 4 mg per mL dexamethasone and 3 mL if 2% MPF lidocaine was injected x 0.5 mL.  This same procedure was performed on the right side using the same needle, technique and injectate.  Patient  tolerated procedure well.  Post procedure instructions were given. 

## 2013-07-13 NOTE — Progress Notes (Signed)
PROCEDURE RECORD [facility]   Name: Maurielle Parola DOB:[birth year] MRN: [id number]  Date:07/13/2013  Physician: Claudette Laws, MD    Nurse/CMA: Davida Falconi,CMA/Shumaker, RN  Allergies:  Allergies  Allergen Reactions  . Baclofen     Perioral paresthesia  . Bactrim [Sulfamethoxazole-Trimethoprim]   . Doxycycline     Consent Signed: yes  Is patient diabetic? no   Pregnant: no LMP: No LMP recorded. Patient is postmenopausal. (age 24-55)  Anticoagulants: no Anti-inflammatory: no Antibiotics: no  Procedure: Medial Branch Block  Position: Prone Start Time: 11:27  End Time: 11:38  Fluoro Time: 35 seconds  RN/CMA Alaira Level,CMA Shumaker,RN    Time 1113 11:43    BP 116/66 134/74    Pulse 72 69    Respirations 14 14    O2 Sat 99 99    S/S 6 6    Pain Level 9/10 4/10     D/C home with father, patient A & O X 3, D/C instructions reviewed, and sits independently.

## 2013-07-16 ENCOUNTER — Other Ambulatory Visit: Payer: Self-pay | Admitting: Internal Medicine

## 2013-08-14 ENCOUNTER — Ambulatory Visit (HOSPITAL_BASED_OUTPATIENT_CLINIC_OR_DEPARTMENT_OTHER): Payer: Medicare Other | Admitting: Physical Medicine & Rehabilitation

## 2013-08-14 ENCOUNTER — Encounter: Payer: Medicare Other | Attending: Physical Medicine and Rehabilitation

## 2013-08-14 ENCOUNTER — Encounter: Payer: Self-pay | Admitting: Physical Medicine & Rehabilitation

## 2013-08-14 VITALS — BP 128/72 | HR 67 | Resp 14 | Ht 65.0 in | Wt 172.0 lb

## 2013-08-14 DIAGNOSIS — M47817 Spondylosis without myelopathy or radiculopathy, lumbosacral region: Secondary | ICD-10-CM | POA: Diagnosis present

## 2013-08-14 DIAGNOSIS — M25539 Pain in unspecified wrist: Secondary | ICD-10-CM | POA: Insufficient documentation

## 2013-08-14 DIAGNOSIS — M25569 Pain in unspecified knee: Secondary | ICD-10-CM | POA: Insufficient documentation

## 2013-08-14 DIAGNOSIS — M19049 Primary osteoarthritis, unspecified hand: Secondary | ICD-10-CM

## 2013-08-14 DIAGNOSIS — M189 Osteoarthritis of first carpometacarpal joint, unspecified: Secondary | ICD-10-CM

## 2013-08-14 NOTE — Patient Instructions (Signed)

## 2013-08-14 NOTE — Progress Notes (Signed)
Right first carpal metacarpal injection under ultrasound guidance Indication: Right carpal metacarpal osteoarthritis which did not respond to splinting or other conservative care Long axis approach 12 Hz linear transducer  The right first carpal metacarpal joint was identified in the area was marked and prepped with Betadine. Care was taken to identify the radial artery. Then a 27-gauge 0.5 inch needle was used to anesthetize skin and subcutaneous tissue.. then a 25-gauge 1.5 inch needle was inserted under direct ultrasound visualization into the first carpal metacarpal joint on the right side. A solution containing 0.5 mL of 1% lidocaine +0.5 mL of 6 mg per mL/stone were injected. Patient tolerated procedure well. Post procedure instructions given

## 2013-09-13 ENCOUNTER — Other Ambulatory Visit: Payer: Self-pay | Admitting: Internal Medicine

## 2013-09-18 ENCOUNTER — Other Ambulatory Visit (INDEPENDENT_AMBULATORY_CARE_PROVIDER_SITE_OTHER): Payer: Medicare Other

## 2013-09-18 ENCOUNTER — Encounter: Payer: Self-pay | Admitting: Physical Medicine & Rehabilitation

## 2013-09-18 DIAGNOSIS — B20 Human immunodeficiency virus [HIV] disease: Secondary | ICD-10-CM

## 2013-09-18 LAB — COMPLETE METABOLIC PANEL WITH GFR
ALT: 11 U/L (ref 0–35)
AST: 15 U/L (ref 0–37)
Albumin: 3.9 g/dL (ref 3.5–5.2)
Alkaline Phosphatase: 118 U/L — ABNORMAL HIGH (ref 39–117)
BUN: 11 mg/dL (ref 6–23)
CO2: 27 meq/L (ref 19–32)
Calcium: 8.9 mg/dL (ref 8.4–10.5)
Chloride: 107 mEq/L (ref 96–112)
Creat: 0.73 mg/dL (ref 0.50–1.10)
GFR, Est African American: >89 mL/min
Glucose, Bld: 90 mg/dL (ref 70–99)
Potassium: 3.9 mEq/L (ref 3.5–5.3)
Sodium: 141 mEq/L (ref 135–145)
Total Bilirubin: 0.3 mg/dL (ref 0.2–1.2)
Total Protein: 6.9 g/dL (ref 6.0–8.3)

## 2013-09-18 LAB — CBC WITH DIFFERENTIAL/PLATELET
BASOS ABS: 0.1 10*3/uL (ref 0.0–0.1)
Basophils Relative: 1 % (ref 0–1)
EOS ABS: 0.3 10*3/uL (ref 0.0–0.7)
Eosinophils Relative: 4 % (ref 0–5)
HCT: 36.3 % (ref 36.0–46.0)
Hemoglobin: 12.5 g/dL (ref 12.0–15.0)
LYMPHS ABS: 2.7 10*3/uL (ref 0.7–4.0)
Lymphocytes Relative: 35 % (ref 12–46)
MCH: 29.6 pg (ref 26.0–34.0)
MCHC: 34.4 g/dL (ref 30.0–36.0)
MCV: 86 fL (ref 78.0–100.0)
Monocytes Absolute: 0.8 10*3/uL (ref 0.1–1.0)
Monocytes Relative: 10 % (ref 3–12)
Neutro Abs: 3.9 10*3/uL (ref 1.7–7.7)
Neutrophils Relative %: 50 % (ref 43–77)
Platelets: 287 10*3/uL (ref 150–400)
RBC: 4.22 MIL/uL (ref 3.87–5.11)
RDW: 13.8 % (ref 11.5–15.5)
WBC: 7.7 10*3/uL (ref 4.0–10.5)

## 2013-09-19 LAB — T-HELPER CELL (CD4) - (RCID CLINIC ONLY)
CD4 % Helper T Cell: 9 % — ABNORMAL LOW (ref 33–55)
CD4 T CELL ABS: 250 /uL — AB (ref 400–2700)

## 2013-09-20 LAB — HIV-1 RNA QUANT-NO REFLEX-BLD
HIV 1 RNA Quant: <20 {copies}/mL
HIV-1 RNA Quant, Log: <1.3 {Log}

## 2013-09-25 ENCOUNTER — Ambulatory Visit: Payer: Medicare Other | Admitting: Internal Medicine

## 2013-09-29 ENCOUNTER — Encounter: Payer: Medicare Other | Attending: Physical Medicine and Rehabilitation

## 2013-09-29 ENCOUNTER — Ambulatory Visit (HOSPITAL_BASED_OUTPATIENT_CLINIC_OR_DEPARTMENT_OTHER): Payer: Medicare Other | Admitting: Physical Medicine & Rehabilitation

## 2013-09-29 ENCOUNTER — Encounter: Payer: Self-pay | Admitting: Physical Medicine & Rehabilitation

## 2013-09-29 VITALS — BP 118/72 | HR 78 | Resp 14 | Ht 66.0 in | Wt 169.0 lb

## 2013-09-29 DIAGNOSIS — M180 Bilateral primary osteoarthritis of first carpometacarpal joints: Secondary | ICD-10-CM

## 2013-09-29 DIAGNOSIS — M25539 Pain in unspecified wrist: Secondary | ICD-10-CM | POA: Insufficient documentation

## 2013-09-29 DIAGNOSIS — M47817 Spondylosis without myelopathy or radiculopathy, lumbosacral region: Secondary | ICD-10-CM | POA: Diagnosis present

## 2013-09-29 DIAGNOSIS — M25569 Pain in unspecified knee: Secondary | ICD-10-CM | POA: Insufficient documentation

## 2013-09-29 DIAGNOSIS — M19049 Primary osteoarthritis, unspecified hand: Secondary | ICD-10-CM | POA: Diagnosis not present

## 2013-09-29 NOTE — Progress Notes (Signed)
Subjective:    Patient ID: Courtney Hamilton, female    DOB: Jun 07, 1960, 53 y.o.   MRN: [id number]  HPI 5/7 Bilateral L3,4,5 MBB helped moderately for about a month  Reviewed CD4 count as well as viral load. These values remain stable and unchanged. Has followup with infectious disease next week Pain Inventory Average Pain 8 Pain Right Now 9 My pain is sharp and burning  In the last 24 hours, has pain interfered with the following? General activity 4 Relation with others 7 Enjoyment of life 7 What TIME of day is your pain at its worst? night Sleep (in general) Poor  Pain is worse with: sitting and standing Pain improves with: heat/ice and medication Relief from Meds: n/a  Mobility how many minutes can you walk? 5 Do you have any goals in this area?  no  Function disabled: date disabled . Do you have any goals in this area?  no  Neuro/Psych trouble walking  Prior Studies Any changes since last visit?  no  Physicians involved in your care Any changes since last visit?  no   Family History  Problem Relation Age of Onset  . Asthma Father   . Diabetes Brother   . Hypertension Brother    History   Social History  . Marital Status: Single    Spouse Name: N/A    Number of Children: N/A  . Years of Education: N/A   Social History Main Topics  . Smoking status: Current Every Day Smoker -- 1.00 packs/day for 30 years    Types: Cigarettes  . Smokeless tobacco: Never Used  . Alcohol Use: No  . Drug Use: No     Comment: past history of cocaine  and alcohol    . Sexual Activity: No     Comment: declined condoms    Other Topics Concern  . None   Social History Narrative  . None   Past Surgical History  Procedure Laterality Date  . Cholecystectomy    . Appendectomy    . Ectopic pregnancy surgery    . Salivary gland surgery     Past Medical History  Diagnosis Date  . HIV (human immunodeficiency virus infection)   . Sciatica   . Ruptured lumbar disc   .  Osteoporosis   . 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 TB (pulmonary tuberculosis) 1993     exposure, treated   . Herniated disc 10/07/2011   BP 118/72  Pulse 78  Resp 14  Ht 5\' 6"  (1.676 m)  Wt 169 lb (76.658 kg)  BMI 27.29 kg/m2  SpO2 100%  Opioid Risk Score:   Fall Risk Score: Low Fall Risk (0-5 points) (patient educated hanodut declined)   Review of Systems  Musculoskeletal: Positive for arthralgias and myalgias.  All other systems reviewed and are negative.      Objective:   Physical Exam  Pain with lumbar extension. Pain with left lumbar and right lumbar cleaning. No pain with for flexion Negative straight leg raising test Lower extremity strength is normal Lower tremor deep tendon reflexes are normal Both hands have tenderness over the carpal metacarpal joint #1 no evidence of erythema      Assessment & Plan:  1. Lumbar spondylosis responding well to medial branch blocks on 2 separate occasions. Greater than 50% relief. Patient has had good relief in the past with radiofrequency neurotomy. We'll schedule for the right side next month. 2. First  carpometacarpal joint osteoarthritis partial relief after injection. Recommend thumb spica splint

## 2013-09-29 NOTE — Patient Instructions (Addendum)
Right thumb spica splint at pharmacy   May try two cyclobenzaprine at night

## 2013-10-02 ENCOUNTER — Encounter: Payer: Self-pay | Admitting: Internal Medicine

## 2013-10-02 ENCOUNTER — Ambulatory Visit (INDEPENDENT_AMBULATORY_CARE_PROVIDER_SITE_OTHER): Payer: Medicare Other | Admitting: Internal Medicine

## 2013-10-02 VITALS — BP 126/80 | HR 70 | Temp 98.0°F | Ht 66.0 in | Wt 170.5 lb

## 2013-10-02 DIAGNOSIS — Z21 Asymptomatic human immunodeficiency virus [HIV] infection status: Secondary | ICD-10-CM

## 2013-10-02 DIAGNOSIS — G4700 Insomnia, unspecified: Secondary | ICD-10-CM

## 2013-10-02 MED ORDER — ZOLPIDEM TARTRATE 5 MG PO TABS: 5.0000 mg | ORAL_TABLET | Freq: Every evening | ORAL | Status: DC | PRN

## 2013-10-02 NOTE — Progress Notes (Signed)
Subjective:    Patient ID: Courtney Hamilton, female    DOB: 1960-06-27, 53 y.o.   MRN: [id number]  HPI 53yo F with HIV, CD 4 count of 250 (8%)/VL <20, on atripla. Doing well with medications and adherence. In the last 2 weeks, she has had a prolonged bout of insomnia. No changes to her bedtime routine. occ also has tension headaches that have increased in frequency since we last saw her. She is otherwise doing ok. In regards to her insomnia, she has tried melatonin without success. She was also instructed to increase her flexeril to 2tabs at bedtime to see if that improved her sleep but she felt that she was just increasingly groggy in the morning. No other health problems.  Allergies  Allergen Reactions  . Baclofen     Perioral paresthesia  . Bactrim [Sulfamethoxazole-Trimethoprim]   . Doxycycline      Current Outpatient Prescriptions on File Prior to Visit  Medication Sig Dispense Refill  . Calcium-Magnesium-Zinc 167-83-8 MG TABS Take 1 tablet by mouth daily.      . clotrimazole (MYCELEX) 10 MG troche       . cyclobenzaprine (FLEXERIL) 10 MG tablet Take 1 tablet (10 mg total) by mouth 3 (three) times daily as needed for muscle spasms.  30 tablet  5  . efavirenz-emtricitabine-tenofovir (ATRIPLA) 600-200-300 MG per tablet Take 1 tablet by mouth at bedtime.  30 tablet  11  . imiquimod (ALDARA) 5 % cream APPLY TOPICALLY 3 (THREE) TIMES A WEEK.  12 each  0  . traMADol (ULTRAM) 50 MG tablet Take 2 tablets (100 mg total) by mouth every 8 (eight) hours as needed.  180 tablet  3  . vitamin C (ASCORBIC ACID) 500 MG tablet Take 500 mg by mouth daily.      Marland Kitchen albuterol (PROVENTIL HFA;VENTOLIN HFA) 108 (90 BASE) MCG/ACT inhaler Inhale 2 puffs into the lungs every 6 (six) hours as needed for wheezing or shortness of breath.  1 Inhaler  0  . fluconazole (DIFLUCAN) 100 MG tablet TAKE 1 TABLET (100 MG TOTAL) BY MOUTH DAILY.  14 tablet  1  . promethazine-phenylephrine (PROMETHAZINE VC) 6.25-5 MG/5ML SYRP  Take 5 mLs by mouth every 6 (six) hours as needed for congestion.  180 mL  0   No current facility-administered medications on file prior to visit.   Active Ambulatory Problems    Diagnosis Date Noted  . Back pain 07/21/2011  . HIV positive 08/03/2011  . Sleep disorder 08/03/2011  . Osteoporosis 03/09/2008  . Recurrent UTI 08/06/2011  . Piriformis syndrome of right side 09/17/2011  . History of tuberculosis exposure 10/01/2011  . Thrush, oral 10/01/2011  . Herpes simplex 10/07/2011  . Peripheral neuropathy 10/07/2011  . Hypercholesteremia 10/07/2011  . Lymphadenitis, acute 10/25/2011  . Bilateral lower extremity edema 12/10/2011  . Reflux 12/13/2011  . Dry skin 12/13/2011  . Lower GI bleed 05/10/2012  . Abdominal  pain, other specified site 05/10/2012  . Chronic constipation 05/18/2012  . Acute bronchitis 01/11/2013  . Lumbar paraspinal muscle spasm 01/16/2013  . 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 Cough 06/23/2013  . Lumbosacral spondylosis without myelopathy 09/29/2013   Resolved Ambulatory Problems    Diagnosis Date Noted  . No Resolved Ambulatory Problems   Past Medical History  Diagnosis Date  . HIV (human immunodeficiency virus infection)   . Sciatica   . Ruptured lumbar disc   . Substance abuse   . TB (pulmonary tuberculosis) 1993   .  Herniated disc 10/07/2011       Review of Systems Review of Systems  Constitutional: Negative for fever, chills, diaphoresis, activity change, appetite change, fatigue and unexpected weight change.  HENT: Negative for congestion, sore throat, rhinorrhea, sneezing, trouble swallowing and sinus pressure.  Eyes: Negative for photophobia and visual disturbance.  Respiratory: Negative for cough, chest tightness, shortness of breath, wheezing and stridor.  Cardiovascular: Negative for chest pain, palpitations and leg swelling.  Gastrointestinal: Negative for nausea, vomiting, abdominal pain, diarrhea, constipation,  blood in stool, abdominal distention and anal bleeding.  Genitourinary: Negative for dysuria, hematuria, flank pain and difficulty urinating.  Musculoskeletal: Negative for myalgias, back pain, joint swelling, arthralgias and gait problem.  Skin: Negative for color change, pallor, rash and wound.  Neurological: Negative for dizziness, tremors, weakness and light-headedness.  Hematological: Negative for adenopathy. Does not bruise/bleed easily.  Psychiatric/Behavioral: poor sleep. Negative for behavioral problems, confusion,dysphoric mood, decreased concentration and agitation.       Objective:   Physical Exam BP 126/80  Pulse 70  Temp(Src) 98 F (36.7 C) (Oral)  Ht 5\' 6"  (1.676 m)  Wt 170 lb 8 oz (77.338 kg)  BMI 27.53 kg/m2 Physical Exam  Constitutional:  oriented to person, place, and time. appears well-developed and well-nourished. No distress.  HENT:  Mouth/Throat: Oropharynx is clear and moist. No oropharyngeal exudate.  Cardiovascular: Normal rate, regular rhythm and normal heart sounds. Exam reveals no gallop and no friction rub.  No murmur heard.  Pulmonary/Chest: Effort normal and breath sounds normal. No respiratory distress.  has no wheezes.  Abdominal: Soft. Bowel sounds are normal.  exhibits no distension. There is no tenderness.  Lymphadenopathy: no cervical adenopathy.  Neurological: alert and oriented to person, place, and time.  Skin: Skin is warm and dry. No rash noted. No erythema.  Psychiatric: a normal mood and affect.  behavior is normal.      Assessment & Plan:  hiv = well controlled. She doesn't think that her insomnia is related to [place]. Does not have vivid dreams. For now, we will continue it. Her cd 4 coutn % is low, will need to do prophylaxis. Will check g6pd def since she has sulfa allerhy  Insomnia = will give ambien 5mg  Qhs prn #20 NR as a trial. Do not plan on prescribing long term  Health maintenance = will check rpr hep b surface ab, lipid  profile at next visit

## 2013-10-04 ENCOUNTER — Other Ambulatory Visit: Payer: Self-pay | Admitting: Sports Medicine

## 2013-10-06 ENCOUNTER — Telehealth: Payer: Self-pay | Admitting: Family Medicine

## 2013-10-06 NOTE — Telephone Encounter (Signed)
Has UTI. Would like RX called in

## 2013-10-09 ENCOUNTER — Encounter: Payer: Self-pay | Admitting: Family Medicine

## 2013-10-09 ENCOUNTER — Ambulatory Visit (INDEPENDENT_AMBULATORY_CARE_PROVIDER_SITE_OTHER): Payer: Medicare HMO | Admitting: Family Medicine

## 2013-10-09 VITALS — BP 142/90 | HR 72 | Temp 98.1°F | Ht 66.0 in | Wt 172.0 lb

## 2013-10-09 DIAGNOSIS — N390 Urinary tract infection, site not specified: Secondary | ICD-10-CM

## 2013-10-09 LAB — POCT URINALYSIS DIPSTICK
Bilirubin, UA: NEGATIVE
Glucose, UA: NEGATIVE
Ketones, UA: NEGATIVE
Nitrite, UA: NEGATIVE
PH UA: 6.5
Protein, UA: NEGATIVE
Spec Grav, UA: 1.02
UROBILINOGEN UA: 0.2

## 2013-10-09 LAB — POCT UA - MICROSCOPIC ONLY

## 2013-10-09 MED ORDER — CEPHALEXIN 500 MG PO CAPS: 500.0000 mg | ORAL_CAPSULE | Freq: Three times a day (TID) | ORAL | Status: DC

## 2013-10-09 NOTE — Patient Instructions (Signed)
The urine sample you gave shows you probably have an infection.  Take keflex 500mg  three times a day for 5 days.  Continue to drink plenty of fluids.

## 2013-10-09 NOTE — Assessment & Plan Note (Signed)
History consistent with UTI. UA with 3+ leukocytes, microscopy "loaded" WBCs, 1+ bacteria.  Plan: keflex 500mg  TID x 5 days, return as needed if symptoms do not get better

## 2013-10-09 NOTE — Progress Notes (Signed)
Patient ID: Laurin Paulo, female   DOB: 1960/04/23, 53 y.o.   MRN: [id number]   Subjective:    Patient ID: [name], female    DOB: Apr 09, 1960, 53 y.o.   MRN: [id number]  Here for same day visit  HPI  CC: burning with urination  # Urinary symptoms:  Started 5 days ago  Urgency, more frequent.   Burning sensation, but only intermittently  Denies back pain ROS: no fevers/chills, no nausea/vomiting, no vaginal discharge, no diarrhea/constipation  Review of Systems   See HPI for ROS. Objective:  BP 142/90  Pulse 72  Temp(Src) 98.1 F (36.7 C) (Oral)  Ht 5\' 6"  (1.676 m)  Wt 172 lb (78.019 kg)  BMI 27.77 kg/m2  General: NAD Cardiac: RRR, normal heart sounds, no murmurs. 2+ radial and PT pulses bilaterally Respiratory: CTAB, normal effort Abdomen: soft, mild tenderness over suprapubic area, nondistended. Bowel sounds present Back: no CVA tenderness    Assessment & Plan:  See Problem List Documentation

## 2013-11-07 ENCOUNTER — Encounter: Payer: Self-pay | Admitting: Family Medicine

## 2013-11-07 ENCOUNTER — Ambulatory Visit (INDEPENDENT_AMBULATORY_CARE_PROVIDER_SITE_OTHER): Payer: Medicare HMO | Admitting: Family Medicine

## 2013-11-07 VITALS — BP 153/93 | HR 54 | Temp 98.1°F | Ht 66.0 in | Wt 172.0 lb

## 2013-11-07 DIAGNOSIS — M545 Low back pain, unspecified: Secondary | ICD-10-CM

## 2013-11-07 MED ORDER — CYCLOBENZAPRINE HCL 10 MG PO TABS: 10.0000 mg | ORAL_TABLET | Freq: Three times a day (TID) | ORAL | Status: DC | PRN
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
## 2013-11-14 ENCOUNTER — Ambulatory Visit (INDEPENDENT_AMBULATORY_CARE_PROVIDER_SITE_OTHER): Payer: Medicare HMO | Admitting: Family Medicine

## 2013-11-14 ENCOUNTER — Telehealth: Payer: Self-pay | Admitting: Family Medicine

## 2013-11-14 ENCOUNTER — Encounter: Payer: Self-pay | Admitting: Family Medicine

## 2013-11-14 VITALS — BP 122/75 | HR 70 | Temp 98.3°F | Ht 66.0 in | Wt 169.0 lb

## 2013-11-14 DIAGNOSIS — J209 Acute bronchitis, unspecified: Secondary | ICD-10-CM

## 2013-11-14 MED ORDER — GUAIFENESIN-CODEINE 100-10 MG/5ML PO SOLN: 5.0000 mL | Freq: Four times a day (QID) | ORAL | Status: DC | PRN

## 2013-11-14 MED ORDER — BENZONATATE 200 MG PO CAPS: 200.0000 mg | ORAL_CAPSULE | Freq: Three times a day (TID) | ORAL | Status: DC | PRN

## 2013-11-14 MED ORDER — DEXTROMETHORPHAN POLISTIREX 30 MG/5ML PO LQCR: 30.0000 mg | Freq: Two times a day (BID) | ORAL | Status: DC

## 2013-11-14 NOTE — Telephone Encounter (Signed)
Cough medicine prescribed by dr Awanda Mink today is $65.  She needs something cheaper.  Insurance doesn't cover the cost Please advise

## 2013-11-14 NOTE — Telephone Encounter (Signed)
Forward message to Dr Awanda Mink to change Rx.Busick, Kevin Fenton

## 2013-11-14 NOTE — Patient Instructions (Addendum)
Please take the Robitussin AC every 6 hours as needed for cough.   Please use the inhaler every 6 hours as well for the next 2-3 days. Mucinex 600 mg DS two times per day.  Please stop smoking as this is making it worse!   Thanks, Dr. Awanda Mink

## 2013-11-14 NOTE — Telephone Encounter (Signed)
Called in [facility] which can be picked up OTC.  Otherwise, there aren't any Rx medications that don't have narcotics in them (has a pain contract with her PM&R doctor).  Thanks, Tamela Oddi. Awanda Mink, DO of [facility] 11/14/2013, 4:01 PM

## 2013-11-14 NOTE — Progress Notes (Signed)
  Subjective:     Courtney Hamilton is a 53 y.o. female here for evaluation of a cough. Onset of symptoms was 4 days ago. Symptoms have been gradually improving since that time. The cough is productive of yellow sputum and is aggravated by nothing. Associated symptoms include: wheezing. Patient does not have a history of asthma. Patient does not have a history of environmental allergens. Patient has not traveled recently. Patient does have a history of smoking. Patient has not had a previous chest x-ray. Patient has not had a PPD done.  She does not endorse any fever, chills, sweats, hemoptysis, worsening productive cough, changes in consistency or production of the mucous.    The following portions of the patient's history were reviewed and updated as appropriate: allergies, current medications, past family history, past medical history, past social history, past surgical history and problem list.  Review of Systems Pertinent items are noted in HPI.    Objective:    Oxygen saturation 100% on room air BP 122/75  Pulse 70  Temp(Src) 98.3 F (36.8 C) (Oral)  Ht 5\' 6"  (1.676 m)  Wt 169 lb (76.658 kg)  BMI 27.29 kg/m2 General appearance: alert, cooperative and appears stated age Head: Normocephalic, without obvious abnormality, atraumatic Eyes: conjunctivae/corneas clear. PERRL, EOM's intact. Fundi benign. Ears: normal TM's and external ear canals both ears Nose: Nares normal. Septum midline. Mucosa normal. No drainage or sinus tenderness. Throat: lips, mucosa, and tongue normal; teeth and gums normal Lungs: clear to auscultation bilaterally Heart: regular rate and rhythm    Assessment:    Acute Bronchitis    Plan:    Explained lack of efficacy of antibiotics in viral disease. Antitussives per medication orders. Avoid exposure to tobacco smoke and fumes. B-agonist inhaler. Call if shortness of breath worsens, blood in sputum, change in character of cough, development of fever or chills,  inability to maintain nutrition and hydration. Avoid exposure to tobacco smoke and fumes.  If no improvement in 4-5 days, recommend calling and will consider Z-pack or doxycycline 100 mg BID x 7 days

## 2013-11-14 NOTE — Telephone Encounter (Signed)
Called and informed patient to try delsym OTC.Busick, Kevin Fenton

## 2013-11-16 ENCOUNTER — Ambulatory Visit: Payer: Medicare HMO | Admitting: Physical Medicine & Rehabilitation

## 2013-11-16 ENCOUNTER — Encounter: Payer: Medicare HMO | Attending: Physical Medicine and Rehabilitation

## 2013-11-16 DIAGNOSIS — M47817 Spondylosis without myelopathy or radiculopathy, lumbosacral region: Secondary | ICD-10-CM | POA: Insufficient documentation

## 2013-11-16 DIAGNOSIS — M25569 Pain in unspecified knee: Secondary | ICD-10-CM | POA: Insufficient documentation

## 2013-11-16 DIAGNOSIS — M25539 Pain in unspecified wrist: Secondary | ICD-10-CM | POA: Insufficient documentation

## 2013-11-24 ENCOUNTER — Ambulatory Visit: Payer: Medicare HMO | Admitting: Physical Medicine & Rehabilitation

## 2013-11-24 ENCOUNTER — Ambulatory Visit (HOSPITAL_BASED_OUTPATIENT_CLINIC_OR_DEPARTMENT_OTHER): Payer: Medicare HMO | Admitting: Physical Medicine & Rehabilitation

## 2013-11-24 ENCOUNTER — Encounter: Payer: Self-pay | Admitting: Physical Medicine & Rehabilitation

## 2013-11-24 ENCOUNTER — Other Ambulatory Visit: Payer: Self-pay

## 2013-11-24 VITALS — BP 109/72 | HR 78 | Resp 16 | Wt 170.6 lb

## 2013-11-24 DIAGNOSIS — M62830 Muscle spasm of back: Secondary | ICD-10-CM

## 2013-11-24 DIAGNOSIS — M25569 Pain in unspecified knee: Secondary | ICD-10-CM | POA: Diagnosis not present

## 2013-11-24 DIAGNOSIS — G8929 Other chronic pain: Secondary | ICD-10-CM

## 2013-11-24 DIAGNOSIS — M545 Low back pain, unspecified: Secondary | ICD-10-CM

## 2013-11-24 DIAGNOSIS — M5380 Other specified dorsopathies, site unspecified: Secondary | ICD-10-CM

## 2013-11-24 DIAGNOSIS — M25539 Pain in unspecified wrist: Secondary | ICD-10-CM | POA: Diagnosis not present

## 2013-11-24 DIAGNOSIS — M47817 Spondylosis without myelopathy or radiculopathy, lumbosacral region: Secondary | ICD-10-CM

## 2013-11-24 DIAGNOSIS — R52 Pain, unspecified: Secondary | ICD-10-CM

## 2013-11-24 MED ORDER — KETOROLAC TROMETHAMINE 60 MG/2ML IM SOLN: 60.0000 mg | Freq: Once | INTRAMUSCULAR | Status: DC

## 2013-11-24 MED ORDER — TRAMADOL HCL 50 MG PO TABS: 100.0000 mg | ORAL_TABLET | Freq: Three times a day (TID) | ORAL | Status: DC | PRN
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
## 2013-11-24 NOTE — Patient Instructions (Addendum)
Toward all injection anti-inflammatories to reduce the inflammation   Physical therapy referral  Xray lumbar

## 2013-12-01 ENCOUNTER — Telehealth: Payer: Self-pay | Admitting: *Deleted

## 2013-12-01 NOTE — Telephone Encounter (Signed)
Called patient and advised that DR agreed to increase meds to QID.  If still no relief then patient will have to be seen

## 2013-12-05 ENCOUNTER — Telehealth: Payer: Self-pay | Admitting: *Deleted

## 2013-12-05 NOTE — Telephone Encounter (Signed)
Patient called and asked [place] to correct her RX QID not TID.

## 2013-12-11 ENCOUNTER — Ambulatory Visit: Payer: Medicare HMO | Attending: Physical Medicine & Rehabilitation

## 2013-12-11 DIAGNOSIS — M2560 Stiffness of unspecified joint, not elsewhere classified: Secondary | ICD-10-CM | POA: Diagnosis not present

## 2013-12-11 DIAGNOSIS — R293 Abnormal posture: Secondary | ICD-10-CM | POA: Diagnosis not present

## 2013-12-11 DIAGNOSIS — Z5189 Encounter for other specified aftercare: Secondary | ICD-10-CM | POA: Insufficient documentation

## 2013-12-11 DIAGNOSIS — M62830 Muscle spasm of back: Secondary | ICD-10-CM | POA: Insufficient documentation

## 2013-12-11 DIAGNOSIS — M47817 Spondylosis without myelopathy or radiculopathy, lumbosacral region: Secondary | ICD-10-CM | POA: Diagnosis not present

## 2013-12-12 ENCOUNTER — Other Ambulatory Visit: Payer: Self-pay | Admitting: Internal Medicine

## 2013-12-12 DIAGNOSIS — N6310 Unspecified lump in the right breast, unspecified quadrant: Secondary | ICD-10-CM

## 2013-12-18 ENCOUNTER — Ambulatory Visit
Admission: RE | Admit: 2013-12-18 | Discharge: 2013-12-18 | Disposition: A | Discharge status: Home / Self Care | Payer: Medicare HMO | Source: Ambulatory Visit | Attending: Internal Medicine | Admitting: Internal Medicine

## 2013-12-18 DIAGNOSIS — N6310 Unspecified lump in the right breast, unspecified quadrant: Secondary | ICD-10-CM

## 2013-12-19 ENCOUNTER — Encounter: Payer: Medicare HMO | Attending: Physical Medicine and Rehabilitation

## 2013-12-19 ENCOUNTER — Ambulatory Visit (HOSPITAL_BASED_OUTPATIENT_CLINIC_OR_DEPARTMENT_OTHER): Payer: Medicare HMO | Admitting: Physical Medicine & Rehabilitation

## 2013-12-19 ENCOUNTER — Encounter: Payer: Self-pay | Admitting: Physical Medicine & Rehabilitation

## 2013-12-19 VITALS — BP 126/78 | HR 63 | Resp 14 | Wt 171.6 lb

## 2013-12-19 DIAGNOSIS — M47817 Spondylosis without myelopathy or radiculopathy, lumbosacral region: Secondary | ICD-10-CM | POA: Diagnosis present

## 2013-12-19 NOTE — Progress Notes (Signed)
Bilateral Lumbar L3, L4  medial branch blocks and L 5 dorsal ramus injection under fluoroscopic guidance  Indication: Lumbar pain which is not relieved by medication management or other conservative care and interfering with self-care and mobility.  History of HIV, received okay from infectious disease specialist to proceed with injection. Last CD4 count was 214 last month  Informed consent was obtained after describing risks and benefits of the procedure with the patient, this includes bleeding, infection, paralysis and medication side effects.  The patient wishes to proceed and has given written consent.  The patient was placed in prone position.  The lumbar area was marked and prepped with Betadine.  One mL of 1% lidocaine was injected into each of 6 areas into the skin and subcutaneous tissue.  Then a 22-gauge 3.5 inch spinal needle was inserted targeting the junction of the left S1 superior articular process and sacral ala junction. Needle was advanced under fluoroscopic guidance.  Bone contact was made.  Omnipaque 180 was injected x 0.5 mL demonstrating no intravascular uptake.  Then a solution containing one mL of 4 mg per mL dexamethasone and 3 mL of 2% MPF lidocaine was injected x 0.5 mL.  Then the left L5 superior articular process in transverse process junction was targeted.  Bone contact was made.  Omnipaque 180 was injected x 0.5 mL demonstrating no intravascular uptake. Then a solution containing one mL of 4 mg per mL dexamethasone and 3 mL of 2% MPF lidocaine was injected x 0.5 mL.  Then the left L4 superior articular process in transverse process junction was targeted.  Bone contact was made.  Omnipaque 180 was injected x 0.5 mL demonstrating no intravascular uptake.  Then a solution containing one mL of 4 mg per mL dexamethasone and 3 mL if 2% MPF lidocaine was injected x 0.5 mL.  This same procedure was performed on the right side using the same needle, technique and injectate.  Patient  tolerated procedure well.  Post procedure instructions were given.

## 2013-12-19 NOTE — Progress Notes (Signed)
  PROCEDURE RECORD  Physical Medicine and Rehabilitation   Name: Courtney Hamilton DOB:March 09, 1961 MRN: [id number]  Date:12/19/2013  Physician: Alysia Penna, MD    Nurse/CMA: Shumaker RN  Allergies:  Allergies  Allergen Reactions  . Baclofen     Perioral paresthesia  . Bactrim [Sulfamethoxazole-Trimethoprim]   . Doxycycline     Consent Signed: Yes.    Is patient diabetic? No.  CBG today?   Pregnant: No. LMP: No LMP recorded. Patient is postmenopausal. (age 78-55)  Anticoagulants: no Anti-inflammatory: no Antibiotics: no  Procedure: Medial Branch Block bilateral Position: Prone Start Time: 1:24 End Time: 1:34 Fluoro Time: 30 seconds  RN/CMA Biomedical engineer    Time 13:00 1:38    BP 126/78 145/68    Pulse 63 65    Respirations 14 14    O2 Sat 99 99    S/S 6 6    Pain Level 7/10 3/10     D/C home with father, patient A & O X 3, D/C instructions reviewed, and sits independently.

## 2013-12-19 NOTE — Patient Instructions (Signed)

## 2013-12-22 ENCOUNTER — Encounter: Payer: Self-pay | Admitting: Family Medicine

## 2013-12-22 ENCOUNTER — Ambulatory Visit (INDEPENDENT_AMBULATORY_CARE_PROVIDER_SITE_OTHER): Payer: Medicare HMO | Admitting: Family Medicine

## 2013-12-22 VITALS — BP 139/94 | HR 82 | Temp 98.5°F | Ht 66.0 in | Wt 169.9 lb

## 2013-12-22 DIAGNOSIS — R05 Cough: Secondary | ICD-10-CM

## 2013-12-22 DIAGNOSIS — R059 Cough, unspecified: Secondary | ICD-10-CM

## 2013-12-22 DIAGNOSIS — F410 Panic disorder [episodic paroxysmal anxiety] without agoraphobia: Secondary | ICD-10-CM

## 2013-12-22 DIAGNOSIS — F411 Generalized anxiety disorder: Secondary | ICD-10-CM

## 2013-12-22 HISTORY — DX: Panic disorder (episodic paroxysmal anxiety): F41.0

## 2013-12-22 MED ORDER — AZITHROMYCIN 250 MG PO TABS: 250.0000 mg | ORAL_TABLET | Freq: Every day | ORAL | Status: DC

## 2013-12-22 MED ORDER — ALBUTEROL SULFATE HFA 108 (90 BASE) MCG/ACT IN AERS: 2.0000 | INHALATION_SPRAY | Freq: Four times a day (QID) | RESPIRATORY_TRACT | Status: DC | PRN

## 2013-12-22 MED ORDER — TRAZODONE HCL 50 MG PO TABS: 50.0000 mg | ORAL_TABLET | Freq: Every day | ORAL | Status: DC

## 2013-12-22 NOTE — Patient Instructions (Signed)
For your cough I am prescribing an antibiotic and an inhaler that you should use for the next 5 days. I also recommend honey and a humidifier to help with your symptoms.  For your anxiety I am prescribing a pill you can take at night which should also help you sleep. If you are not feeling better in 2-3 weeks please come back and see [place] and we may need to add another medicine.

## 2013-12-25 ENCOUNTER — Other Ambulatory Visit: Payer: Self-pay | Admitting: Internal Medicine

## 2013-12-26 ENCOUNTER — Ambulatory Visit: Payer: Medicare HMO

## 2013-12-26 DIAGNOSIS — Z5189 Encounter for other specified aftercare: Secondary | ICD-10-CM | POA: Diagnosis not present

## 2013-12-28 ENCOUNTER — Ambulatory Visit: Payer: Medicare HMO | Admitting: Physical Therapy

## 2013-12-28 DIAGNOSIS — Z5189 Encounter for other specified aftercare: Secondary | ICD-10-CM | POA: Diagnosis not present

## 2014-01-01 ENCOUNTER — Other Ambulatory Visit: Payer: Medicare HMO

## 2014-01-02 ENCOUNTER — Ambulatory Visit: Payer: Medicare HMO

## 2014-01-02 DIAGNOSIS — Z5189 Encounter for other specified aftercare: Secondary | ICD-10-CM | POA: Diagnosis not present

## 2014-01-04 ENCOUNTER — Ambulatory Visit: Payer: Medicare HMO | Admitting: Physical Therapy

## 2014-01-04 DIAGNOSIS — Z5189 Encounter for other specified aftercare: Secondary | ICD-10-CM | POA: Diagnosis not present

## 2014-01-07 DIAGNOSIS — F411 Generalized anxiety disorder: Secondary | ICD-10-CM | POA: Insufficient documentation

## 2014-01-07 NOTE — Assessment & Plan Note (Addendum)
Possibly just viral URI but with h/o recurrent bronchitis, HIV as well as sinus tenderness will treat with antibiotics - azithro - honey, other sx management - rtc if not improved w/in 5 days

## 2014-01-07 NOTE — Assessment & Plan Note (Signed)
Likely GAD with possible panic component, not interested in meds or counseling at this time but would like help with sleep - trial of trazodone - if not improving in 2-3 weeks return for further eval with pcp

## 2014-01-07 NOTE — Progress Notes (Signed)
   Subjective:    Patient ID: Courtney Hamilton, female    DOB: [birth year], 53 y.o.   MRN: [id number]  HPI Pt presents for cough, 5 days. Also reports congestion in chest and head. Frequent headaches and anxiety.  She reports feeling anxious for no reason over the last few weeks. She denies any significant stress or depressive symptoms.    Review of Systems See HPI    Objective:   Physical Exam  Constitutional: She is oriented to person, place, and time. She appears well-developed and well-nourished. No distress.  HENT:  Head: Normocephalic and atraumatic.  Nose: Mucosal edema and rhinorrhea present. No sinus tenderness, nasal deformity, septal deviation or nasal septal hematoma. No epistaxis.  No foreign bodies. Right sinus exhibits maxillary sinus tenderness and frontal sinus tenderness. Left sinus exhibits maxillary sinus tenderness and frontal sinus tenderness.  Mouth/Throat: Uvula is midline and mucous membranes are normal. Posterior oropharyngeal erythema present. No oropharyngeal exudate, posterior oropharyngeal edema or tonsillar abscesses.  Eyes: Conjunctivae are normal. Right eye exhibits no discharge. Left eye exhibits no discharge. No scleral icterus.  Cardiovascular: Normal rate, regular rhythm and normal heart sounds.   No murmur heard. Pulmonary/Chest: Effort normal and breath sounds normal. No respiratory distress. She has no wheezes.  Abdominal: Soft. She exhibits no distension. There is no tenderness.  Neurological: She is alert and oriented to person, place, and time.  Skin: Skin is warm and dry. No rash noted. She is not diaphoretic.  Psychiatric: She has a normal mood and affect. Her behavior is normal.  Nursing note and vitals reviewed.         Assessment & Plan:

## 2014-01-07 NOTE — Assessment & Plan Note (Signed)
>>  ASSESSMENT AND PLAN FOR PRODUCTIVE COUGH WRITTEN ON 01/07/2014  3:11 PM BY ADAMO, Jeris PentaELENA M, MD  Possibly just viral URI but with h/o recurrent bronchitis, HIV as well as sinus tenderness will treat with antibiotics - azithro - honey, other sx management - rtc if not improved w/in 5 days

## 2014-01-16 ENCOUNTER — Ambulatory Visit: Payer: Medicare HMO | Admitting: Physical Medicine & Rehabilitation

## 2014-01-16 ENCOUNTER — Encounter: Payer: Medicare HMO | Attending: Physical Medicine and Rehabilitation

## 2014-01-16 DIAGNOSIS — M47817 Spondylosis without myelopathy or radiculopathy, lumbosacral region: Secondary | ICD-10-CM | POA: Insufficient documentation

## 2014-01-17 ENCOUNTER — Ambulatory Visit (INDEPENDENT_AMBULATORY_CARE_PROVIDER_SITE_OTHER): Payer: Medicare HMO | Admitting: Internal Medicine

## 2014-01-17 VITALS — BP 115/74 | HR 69 | Temp 97.2°F | Wt 159.0 lb

## 2014-01-17 DIAGNOSIS — Z21 Asymptomatic human immunodeficiency virus [HIV] infection status: Secondary | ICD-10-CM

## 2014-01-17 DIAGNOSIS — Z23 Encounter for immunization: Secondary | ICD-10-CM

## 2014-01-17 DIAGNOSIS — G4700 Insomnia, unspecified: Secondary | ICD-10-CM

## 2014-01-17 DIAGNOSIS — M47817 Spondylosis without myelopathy or radiculopathy, lumbosacral region: Secondary | ICD-10-CM

## 2014-01-17 LAB — COMPLETE METABOLIC PANEL WITH GFR
AST: 15 U/L (ref 0–37)
Albumin: 4.1 g/dL (ref 3.5–5.2)
Alkaline Phosphatase: 121 U/L — ABNORMAL HIGH (ref 39–117)
BUN: 7 mg/dL (ref 6–23)
CHLORIDE: 103 meq/L (ref 96–112)
CO2: 26 meq/L (ref 19–32)
Calcium: 9.5 mg/dL (ref 8.4–10.5)
Creat: 0.71 mg/dL (ref 0.50–1.10)
GLUCOSE: 84 mg/dL (ref 70–99)
POTASSIUM: 3.9 meq/L (ref 3.5–5.3)
SODIUM: 137 meq/L (ref 135–145)
TOTAL PROTEIN: 7.4 g/dL (ref 6.0–8.3)
Total Bilirubin: 0.3 mg/dL (ref 0.2–1.2)
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
## 2014-01-18 LAB — CBC WITH DIFFERENTIAL/PLATELET
BASOS ABS: 0.1 10*3/uL (ref 0.0–0.1)
Basophils Relative: 1 % (ref 0–1)
EOS ABS: 0.4 10*3/uL (ref 0.0–0.7)
Eosinophils Relative: 5 % (ref 0–5)
HCT: 38.2 % (ref 36.0–46.0)
Hemoglobin: 13.1 g/dL (ref 12.0–15.0)
Lymphocytes Relative: 39 % (ref 12–46)
Lymphs Abs: 2.8 10*3/uL (ref 0.7–4.0)
MCH: 29.2 pg (ref 26.0–34.0)
MCHC: 34.3 g/dL (ref 30.0–36.0)
MCV: 85.3 fL (ref 78.0–100.0)
Monocytes Absolute: 0.6 10*3/uL (ref 0.1–1.0)
Monocytes Relative: 8 % (ref 3–12)
NEUTROS PCT: 47 % (ref 43–77)
Neutro Abs: 3.3 10*3/uL (ref 1.7–7.7)
Platelets: 290 10*3/uL (ref 150–400)
RBC: 4.48 MIL/uL (ref 3.87–5.11)
RDW: 14.5 % (ref 11.5–15.5)
WBC: 7.1 10*3/uL (ref 4.0–10.5)

## 2014-01-19 LAB — T-HELPER CELL (CD4) - (RCID CLINIC ONLY)
CD4 % Helper T Cell: 13 % — ABNORMAL LOW (ref 33–55)
CD4 T CELL ABS: 370 /uL — AB (ref 400–2700)

## 2014-01-19 LAB — HIV-1 RNA QUANT-NO REFLEX-BLD

## 2014-02-09 ENCOUNTER — Other Ambulatory Visit (HOSPITAL_COMMUNITY)
Admission: RE | Admit: 2014-02-09 | Discharge: 2014-02-09 | Disposition: A | Discharge status: Home / Self Care | Payer: Medicare HMO | Source: Ambulatory Visit | Attending: Internal Medicine | Admitting: Internal Medicine

## 2014-02-09 ENCOUNTER — Ambulatory Visit (INDEPENDENT_AMBULATORY_CARE_PROVIDER_SITE_OTHER): Payer: Medicare HMO | Admitting: *Deleted

## 2014-02-09 DIAGNOSIS — Z124 Encounter for screening for malignant neoplasm of cervix: Secondary | ICD-10-CM

## 2014-02-09 NOTE — Patient Instructions (Signed)
Your results will be ready in about a week.  I will mail them to you.  Happy Holidays!  Langley Gauss, RN

## 2014-02-09 NOTE — Progress Notes (Signed)
  Subjective:     Courtney Hamilton is a 53 y.o. woman who comes in today for a  pap smear only.  Previous abnormal Pap smears: yes. Contraception: condoms.  Objective:    There were no vitals taken for this visit. Pelvic Exam:  Pap smear obtained.   Assessment:    Screening pap smear.   Plan:    Follow up in one year, or as indicated by Pap results.  Pt given educational materials re: HIV and women, self-esteem, BSE, nutrition and diet management, PAP smears and partner safety. Pt given condoms

## 2014-02-12 LAB — CYTOLOGY - PAP

## 2014-02-14 ENCOUNTER — Encounter: Payer: Self-pay | Admitting: *Deleted

## 2014-02-14 ENCOUNTER — Other Ambulatory Visit: Payer: Self-pay | Admitting: Internal Medicine

## 2014-02-14 DIAGNOSIS — B977 Papillomavirus as the cause of diseases classified elsewhere: Secondary | ICD-10-CM

## 2014-02-16 ENCOUNTER — Encounter: Payer: Self-pay | Admitting: Physical Medicine & Rehabilitation

## 2014-02-16 ENCOUNTER — Ambulatory Visit (HOSPITAL_BASED_OUTPATIENT_CLINIC_OR_DEPARTMENT_OTHER): Payer: Medicare HMO | Admitting: Physical Medicine & Rehabilitation

## 2014-02-16 ENCOUNTER — Encounter: Payer: Medicare HMO | Attending: Physical Medicine and Rehabilitation

## 2014-02-16 VITALS — HR 82 | Resp 14 | Ht 66.0 in | Wt 169.0 lb

## 2014-02-16 DIAGNOSIS — M7651 Patellar tendinitis, right knee: Secondary | ICD-10-CM

## 2014-02-16 DIAGNOSIS — M47817 Spondylosis without myelopathy or radiculopathy, lumbosacral region: Secondary | ICD-10-CM

## 2014-02-16 MED ORDER — TRAMADOL HCL 50 MG PO TABS: 100.0000 mg | ORAL_TABLET | Freq: Three times a day (TID) | ORAL | Status: DC | PRN

## 2014-02-16 MED ORDER — DICLOFENAC SODIUM 1 % TD GEL: 2.0000 g | Freq: Four times a day (QID) | TRANSDERMAL | Status: DC

## 2014-02-16 NOTE — Progress Notes (Signed)
Subjective:    Patient ID: Courtney Hamilton, female    DOB: 21-Jun-1960, 53 y.o.   MRN: [id number]  HPI Patient had L3 L4 L5 medial branch blocks 12/19/2013. Initially did not think they were helpful however in retrospect she could tell they were helpful and could tell when they started wearing off. Nevertheless overall improvement. Planning to start working out more. Has some right knee pain. No fall or injury to that area. Family history of gout. Followed up with family medicine for upper respiratory infection not HIV related.  Followed up with infectious disease, no change in HIV medications plan. Pain Inventory Average Pain 8 Pain Right Now 5 My pain is constant, sharp and burning  In the last 24 hours, has pain interfered with the following? General activity 5 Relation with others 5 Enjoyment of life 5 What TIME of day is your pain at its worst? evening Sleep (in general) Fair  Pain is worse with: standing Pain improves with: heat/ice and medication Relief from Meds: 9  Mobility walk without assistance how many minutes can you walk? 10 do you drive?  yes  Function disabled: date disabled 2007  Neuro/Psych No problems in this area  Prior Studies Any changes since last visit?  no  Physicians involved in your care Any changes since last visit?  no   Family History  Problem Relation Age of Onset  . Asthma Father   . Diabetes Brother   . Hypertension Brother    History   Social History  . Marital Status: Single    Spouse Name: N/A    Number of Children: N/A  . Years of Education: N/A   Social History Main Topics  . Smoking status: Current Every Day Smoker -- 0.50 packs/day for 30 years    Types: Cigarettes  . Smokeless tobacco: Never Used     Comment: is trying to quit at this time  . Alcohol Use: No  . Drug Use: No     Comment: past history of cocaine  and alcohol    . Sexual Activity: No     Comment: declined condoms    Other Topics Concern  .  None   Social History Narrative   Past Surgical History  Procedure Laterality Date  . Cholecystectomy    . Appendectomy    . Ectopic pregnancy surgery    . Salivary gland surgery     Past Medical History  Diagnosis Date  . HIV (human immunodeficiency virus infection)   . Sciatica   . Ruptured lumbar disc   . Osteoporosis   . 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 TB (pulmonary tuberculosis) 1993     exposure, treated   . Herniated disc 10/07/2011   Pulse 82  Resp 14  Ht 5\' 6"  (1.676 m)  Wt 169 lb (76.658 kg)  BMI 27.29 kg/m2  SpO2 100%  Opioid Risk Score:   Fall Risk Score: Low Fall Risk (0-5 points)  Review of Systems  Constitutional:       Night sweats  HENT: Negative.   Eyes: Negative.   Respiratory: Negative.   Cardiovascular: Negative.   Gastrointestinal: Negative.   Endocrine: Negative.   Genitourinary: Negative.   Musculoskeletal: Positive for back pain.       Leg pain  Skin: Negative.   Allergic/Immunologic: Negative.   Neurological: Negative.   Hematological: Negative.   Psychiatric/Behavioral: Negative.        Objective:  Physical Exam  Back has no tenderness to palpation in the lumbar paraspinal muscles or PSIS area Has pain with leaning more towards left side than toward the right side. Knee has some tenderness over the right patellar tendon. No joint effusion. No hypersensitivity to touch over the skin area. No pain with right knee range of motion Good medial lateral stability Lower extremity strength is normal. Gen. No acute distress Mood and affect appropriate      Assessment & Plan:  . Lumbar spondylosis responding well to medial branch blocks on 2 separate occasions. Greater than 50% relief. Patient has had good relief in the past with radiofrequency neurotomy.No need for repeat radiofrequency at this time. We'll monitor how she does once the exercise schedule increases. Caution against hyperextension  exercises 2. First carpometacarpal joint osteoarthritis partial relief after injection.  No need for reinjection 3. Right patellar tendinitis will prescribe Voltaren gel which can also be used for right hand and wrist arthritis Continue tramadol

## 2014-02-23 ENCOUNTER — Telehealth: Payer: Self-pay | Admitting: Family Medicine
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
## 2014-03-05 ENCOUNTER — Encounter: Payer: Self-pay | Admitting: *Deleted

## 2014-03-13 ENCOUNTER — Other Ambulatory Visit: Payer: Self-pay | Admitting: Family Medicine

## 2014-03-14 NOTE — Telephone Encounter (Signed)
It looks like patient is being seen by a physical medicine provider as well.  Would really like to see patient in office if she is still having back spasms, as I have never seen her before.  Please advise.

## 2014-03-16 NOTE — Telephone Encounter (Signed)
LM for patient to call back.  Please give her message from MD and make an appt for her to follow up with pcp.  Thanks [employer]

## 2014-04-01 ENCOUNTER — Encounter (HOSPITAL_COMMUNITY): Payer: Self-pay | Admitting: Family Medicine

## 2014-04-01 ENCOUNTER — Emergency Department (HOSPITAL_COMMUNITY): Payer: Medicare HMO

## 2014-04-01 ENCOUNTER — Emergency Department (HOSPITAL_COMMUNITY)
Admission: EM | Admit: 2014-04-01 | Discharge: 2014-04-01 | Disposition: A | Discharge status: Home / Self Care | Payer: Medicare HMO | Attending: Emergency Medicine | Admitting: Emergency Medicine

## 2014-04-01 DIAGNOSIS — Z8611 Personal history of tuberculosis: Secondary | ICD-10-CM | POA: Diagnosis not present

## 2014-04-01 DIAGNOSIS — Z79899 Other long term (current) drug therapy: Secondary | ICD-10-CM | POA: Insufficient documentation

## 2014-04-01 DIAGNOSIS — M549 Dorsalgia, unspecified: Secondary | ICD-10-CM | POA: Insufficient documentation

## 2014-04-01 DIAGNOSIS — R079 Chest pain, unspecified: Secondary | ICD-10-CM

## 2014-04-01 DIAGNOSIS — Z792 Long term (current) use of antibiotics: Secondary | ICD-10-CM | POA: Insufficient documentation

## 2014-04-01 DIAGNOSIS — M810 Age-related osteoporosis without current pathological fracture: Secondary | ICD-10-CM | POA: Diagnosis not present

## 2014-04-01 DIAGNOSIS — Z791 Long term (current) use of non-steroidal anti-inflammatories (NSAID): Secondary | ICD-10-CM | POA: Diagnosis not present

## 2014-04-01 DIAGNOSIS — R0789 Other chest pain: Secondary | ICD-10-CM | POA: Diagnosis not present

## 2014-04-01 DIAGNOSIS — R51 Headache: Secondary | ICD-10-CM | POA: Diagnosis present

## 2014-04-01 DIAGNOSIS — Z720 Tobacco use: Secondary | ICD-10-CM | POA: Diagnosis not present

## 2014-04-01 DIAGNOSIS — Z21 Asymptomatic human immunodeficiency virus [HIV] infection status: Secondary | ICD-10-CM | POA: Insufficient documentation

## 2014-04-01 LAB — BASIC METABOLIC PANEL
Anion gap: 7 (ref 5–15)
BUN: 6 mg/dL (ref 6–23)
CO2: 29 mmol/L (ref 19–32)
Calcium: 9.3 mg/dL (ref 8.4–10.5)
Chloride: 106 mmol/L (ref 96–112)
Creatinine, Ser: 0.79 mg/dL (ref 0.50–1.10)
GFR calc Af Amer: >90 mL/min (ref >90)
GFR calc non Af Amer: >90 mL/min (ref >90)
Glucose, Bld: 90 mg/dL (ref 70–99)
Potassium: 3.9 mmol/L (ref 3.5–5.1)
Sodium: 142 mmol/L (ref 135–145)

## 2014-04-01 LAB — CBC
HCT: 39.8 % (ref 36.0–46.0)
Hemoglobin: 13 g/dL (ref 12.0–15.0)
MCH: 29 pg (ref 26.0–34.0)
MCHC: 32.7 g/dL (ref 30.0–36.0)
MCV: 88.6 fL (ref 78.0–100.0)
Platelets: 257 10*3/uL (ref 150–400)
RBC: 4.49 MIL/uL (ref 3.87–5.11)
RDW: 13.9 % (ref 11.5–15.5)
WBC: 8.6 10*3/uL (ref 4.0–10.5)

## 2014-04-01 LAB — I-STAT TROPONIN, ED: Troponin i, poc: 0 ng/mL (ref 0.00–0.08)

## 2014-04-01 MED ORDER — KETOROLAC TROMETHAMINE 30 MG/ML IJ SOLN
30.0000 mg | Freq: Once | INTRAMUSCULAR | Status: AC
  Administered 2014-04-01: 30 mg via INTRAMUSCULAR
  Filled 2014-04-01: qty 1

## 2014-04-01 MED ORDER — KETOROLAC TROMETHAMINE 30 MG/ML IJ SOLN: 30.0000 mg | Freq: Once | INTRAMUSCULAR | Status: DC

## 2014-04-01 NOTE — ED Provider Notes (Signed)
CSN: [id number]     Arrival date & time 04/01/14  1337 History   First MD Initiated Contact with Patient 04/01/14 1645     Chief Complaint  Patient presents with  . Headache     (Consider location/radiation/quality/duration/timing/severity/associated sxs/prior Treatment) HPI   53yF with HA and CP. Pt feels related to her blood pressure.  Systolic BP over 242 yesterday. Took pressure because feeling poorly. Doesn't regularly check it. HA mild/diffuse. Gradual onset. Denies trauma. No fever or chills. No acute visual complaints. NO acute numbness, tingling or loss of strength. No significant HA history. No n/v. Numerous other complaints including CP and lower back pain. CP achy. Fairly constant since yesterday. No change with exertion. No positional or reproducible with palpation. No unusual leg pain or swelling.   Past Medical History  Diagnosis Date  . HIV (human immunodeficiency virus infection)   . Sciatica   . Ruptured lumbar disc   . Osteoporosis   . 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 TB (pulmonary tuberculosis) 1993     exposure, treated   . Herniated disc 10/07/2011   Past Surgical History  Procedure Laterality Date  . Cholecystectomy    . Appendectomy    . Ectopic pregnancy surgery    . Salivary gland surgery     Family History  Problem Relation Age of Onset  . Asthma Father   . Diabetes Brother   . Hypertension Brother    History  Substance Use Topics  . Smoking status: Current Every Day Smoker -- 0.50 packs/day for 30 years    Types: Cigarettes  . Smokeless tobacco: Never Used     Comment: is trying to quit at this time  . Alcohol Use: No   OB History    No data available     Review of Systems  All systems reviewed and negative, other than as noted in HPI.   Allergies  Baclofen; Bactrim; and Doxycycline  Home Medications   Prior to Admission medications   Medication Sig Start Date End Date Taking? Authorizing Provider   albuterol (PROVENTIL HFA;VENTOLIN HFA) 108 (90 BASE) MCG/ACT inhaler Inhale 2 puffs into the lungs every 6 (six) hours as needed for wheezing or shortness of breath. 12/22/13  Yes Frazier Richards, MD  ATRIPLA 600-200-300 MG per tablet TAKE 1 TABLET BY MOUTH AT BEDTIME. 12/25/13  Yes Carlyle Basques, MD  Biotin 5000 MCG CAPS Take 5,000 mcg by mouth daily.   Yes Historical Provider, MD  Calcium-Magnesium-Zinc [phone] MG TABS Take 1 tablet by mouth daily.   Yes Historical Provider, MD  clotrimazole (MYCELEX) 10 MG troche Take 10 mg by mouth as needed (thrush).  07/01/11  Yes Historical Provider, MD  Cyanocobalamin 1000 MCG SUBL Place 1,000 mcg under the tongue daily.    Yes Historical Provider, MD  cyclobenzaprine (FLEXERIL) 10 MG tablet Take 1 tablet (10 mg total) by mouth 3 (three) times daily as needed for muscle spasms. 11/07/13  Yes Leone Brand, MD  diclofenac sodium (VOLTAREN) 1 % GEL Apply 2 g topically 4 (four) times daily. Patient taking differently: Apply 1 application topically 4 (four) times daily as needed (arthritis pain).  02/16/14  Yes Charlett Blake, MD  fluconazole (DIFLUCAN) 100 MG tablet Take 100 mg by mouth daily as needed (thrush).   Yes Historical Provider, MD  imiquimod (ALDARA) 5 % cream Apply 1 application topically daily as needed (skin cancer treatment).   Yes  Historical Provider, MD  naproxen sodium (ANAPROX) 220 MG tablet Take 220 mg by mouth 2 (two) times daily as needed (pain).   Yes Historical Provider, MD  Polyvinyl Alcohol-Povidone (MURINE TEARS FOR DRY EYES OP) Place 1 drop into both eyes daily as needed (dry eyes).   Yes Historical Provider, MD  traMADol (ULTRAM) 50 MG tablet Take 2 tablets (100 mg total) by mouth every 8 (eight) hours as needed. Patient taking differently: Take 100 mg by mouth every 4 (four) hours as needed (pain).  02/16/14  Yes Charlett Blake, MD  traZODone (DESYREL) 50 MG tablet Take 1 tablet (50 mg total) by mouth at bedtime. 12/22/13   Yes Frazier Richards, MD  vitamin C (ASCORBIC ACID) 500 MG tablet Take 500 mg by mouth daily.   Yes Historical Provider, MD  vitamin E 400 UNIT capsule Take 400 Units by mouth daily.   Yes Historical Provider, MD  azithromycin (ZITHROMAX) 250 MG tablet Take 1 tablet (250 mg total) by mouth daily. Patient not taking: Reported on 04/01/2014 12/22/13   Frazier Richards, MD  fluconazole (DIFLUCAN) 100 MG tablet TAKE 1 TABLET (100 MG TOTAL) BY MOUTH DAILY. Patient not taking: Reported on 04/01/2014    Carlyle Basques, MD  imiquimod (ALDARA) 5 % cream APPLY TOPICALLY 3 (THREE) TIMES A WEEK. Patient not taking: Reported on 04/01/2014 02/15/14   Carlyle Basques, MD   BP 128/87 mmHg  Pulse 55  Temp(Src) 98.3 F (36.8 C)  Resp 18  SpO2 99% Physical Exam  Constitutional: She is oriented to person, place, and time. She appears well-developed and well-nourished. No distress.  HENT:  Head: Normocephalic and atraumatic.  Eyes: Conjunctivae are normal. Pupils are equal, round, and reactive to light. Right eye exhibits no discharge. Left eye exhibits no discharge.  Neck: Neck supple.  No nuchal rigidity  Cardiovascular: Normal rate, regular rhythm and normal heart sounds.  Exam reveals no gallop and no friction rub.   No murmur heard. Pulmonary/Chest: Effort normal and breath sounds normal. No respiratory distress.  Abdominal: Soft. She exhibits no distension. There is no tenderness.  Musculoskeletal: She exhibits no edema or tenderness.  Lower extremities symmetric as compared to each other. No calf tenderness. Negative Homan's. No palpable cords.   Neurological: She is alert and oriented to person, place, and time. No cranial nerve deficit. She exhibits normal muscle tone. Coordination normal.  Skin: Skin is warm and dry.  Psychiatric: She has a normal mood and affect. Her behavior is normal. Thought content normal.  Nursing note and vitals reviewed.   ED Course  Procedures (including critical care  time) Labs Review Labs Reviewed  CBC  BASIC METABOLIC PANEL  Randolm Idol, ED    Imaging Review Dg Chest 2 View  04/01/2014   CLINICAL DATA:  Headache, chest pain  EXAM: CHEST  2 VIEW  COMPARISON:  None.  FINDINGS: Cardiomediastinal silhouette is unremarkable. No acute infiltrate or pleural effusion. No pulmonary edema. Bony thorax is unremarkable.  IMPRESSION: No active cardiopulmonary disease.   Electronically Signed   By: Lahoma Crocker M.D.   On: 04/01/2014 14:34     EKG Interpretation   Date/Time:  Sunday April 01 2014 13:41:49 EST Ventricular Rate:  65 PR Interval:  134 QRS Duration: 76 QT Interval:  392 QTC Calculation: 407 R Axis:   62 Text Interpretation:  Normal sinus rhythm Non-specific ST-t changes No old  tracing to compare Confirmed by Cleaven Demario  MD, [place] [id number]) on 04/01/2014  6:32:26 PM  MDM   Final diagnoses:  Chest discomfort  Headache.  53yF with several complaint including CP, back pain and headache. Suspect primary HA. Consider emergent secondary causes such as bleed, infectious or mass but doubt. There is no history of trauma. Pt has a nonfocal neurological exam. Afebrile and neck supple. No use of blood thinning medication. Consider ocular etiology such as acute angle closure glaucoma but doubt. Pt denies acute change in visual acuity and eye exam unremarkable. Doubt temporal arteritis given age, no temporal tenderness and temporal artery pulsations palpable. Doubt CO poisoning. No contacts with similar symptoms. Doubt venous thrombosis. Doubt carotid or vertebral arteries dissection. In terms of her CP, I doubt ACS, dissection, PE, serious infection or other emergent pathology.  Feel that can be safely discharged, but strict return precautions discussed. Outpt fu.    Virgel Manifold, MD 04/09/14 1344

## 2014-04-01 NOTE — ED Notes (Signed)
Pt here for HA and hypertension. sts she has been feeling tightness in her chest also since yesterday. sts her BP was over 200 yesterday.

## 2014-04-01 NOTE — ED Notes (Addendum)
Pt reports elevated BP and headache, onset yesterday. 6/10 headache upon arrival to ED. Pt is alert and oriented x4. No neurological deficits noted. Respirations unlabored. Pt also states midsternal chest pressure radiating to back.

## 2014-04-01 NOTE — Discharge Instructions (Signed)

## 2014-04-04 ENCOUNTER — Other Ambulatory Visit: Payer: Medicare HMO

## 2014-04-06 ENCOUNTER — Encounter: Payer: Self-pay | Admitting: Family Medicine

## 2014-04-06 ENCOUNTER — Ambulatory Visit (INDEPENDENT_AMBULATORY_CARE_PROVIDER_SITE_OTHER): Payer: Medicare HMO | Admitting: Family Medicine

## 2014-04-06 VITALS — BP 134/73 | HR 62 | Temp 98.0°F | Ht 66.0 in | Wt 168.7 lb

## 2014-04-06 DIAGNOSIS — E7800 Pure hypercholesterolemia, unspecified: Secondary | ICD-10-CM

## 2014-04-06 DIAGNOSIS — F17200 Nicotine dependence, unspecified, uncomplicated: Secondary | ICD-10-CM

## 2014-04-06 DIAGNOSIS — Z789 Other specified health status: Secondary | ICD-10-CM

## 2014-04-06 DIAGNOSIS — M5440 Lumbago with sciatica, unspecified side: Secondary | ICD-10-CM

## 2014-04-06 LAB — LIPID PANEL
CHOL/HDL RATIO: 4.5 ratio
CHOLESTEROL: 221 mg/dL — AB (ref 0–200)
HDL: 49 mg/dL (ref >39)
LDL CALC: 144 mg/dL — AB (ref 0–99)
TRIGLYCERIDES: 139 mg/dL (ref <150)
VLDL: 28 mg/dL (ref 0–40)

## 2014-04-06 MED ORDER — CYCLOBENZAPRINE HCL 10 MG PO TABS: 10.0000 mg | ORAL_TABLET | Freq: Three times a day (TID) | ORAL | Status: DC | PRN

## 2014-04-06 MED ORDER — ASPIRIN 81 MG PO TABS
81.0000 mg | ORAL_TABLET | ORAL | Status: DC
Start: 2014-04-06 — End: 2014-06-27

## 2014-04-06 NOTE — Progress Notes (Signed)
   Subjective:    Patient ID: Courtney Hamilton, female    DOB: 17-Aug-1960, 54 y.o.   MRN: [id number]  HPI FU ER.  She was seen for elevated BP at home, vague chest pain (not typical angina) and vertigo.  All symptoms have resolved.  She is a smoker and has a quit date in two days. Last lipids had high LDL.  She is due for check and is fasting. BP is normal.  She has had high salt diet.  Discussed ways to decrease. Needs refill on flexeril    Review of Systems     Objective:   Physical Exam Lungs clear. Cardiac RRR without m or g Ext no edema       Assessment & Plan:

## 2014-04-06 NOTE — Assessment & Plan Note (Signed)
Encouraged to keep her quit date

## 2014-04-06 NOTE — Patient Instructions (Addendum)
The single biggest thing you can do to be healthier is to quit smoking.  Good luck with your quit date. I will call with the cholesterol results and we can decide if you need medication. Another way to prevent heart attacks and strokes is to take aspirin 81 mg every day.  Aspirin is a mild blood thinner.  Ervery other day is OK  I refilled your flexeril Get rid of the salt shaker.  If you wish you can try Ms. Dash.

## 2014-04-06 NOTE — Assessment & Plan Note (Signed)
>>  ASSESSMENT AND PLAN FOR BACK PAIN WRITTEN ON 04/06/2014  3:00 PM BY HENSEL, Dorothy Spark, MD  Refill flexeril

## 2014-04-06 NOTE — Assessment & Plan Note (Signed)
Refill flexeril.

## 2014-04-06 NOTE — Assessment & Plan Note (Addendum)
Recheck to risk stratify.  May need statin.  Suggested ASA low dose.  Remote history of lower GI bleed.   10 year CAD risk based on this lipid profile is 5%

## 2014-04-19 ENCOUNTER — Ambulatory Visit (INDEPENDENT_AMBULATORY_CARE_PROVIDER_SITE_OTHER): Payer: Medicare HMO | Admitting: Internal Medicine

## 2014-04-19 ENCOUNTER — Encounter: Payer: Self-pay | Admitting: Internal Medicine

## 2014-04-19 VITALS — BP 124/88 | HR 88 | Temp 98.3°F | Ht 66.0 in | Wt 170.0 lb

## 2014-04-19 DIAGNOSIS — R232 Flushing: Secondary | ICD-10-CM

## 2014-04-19 DIAGNOSIS — N951 Menopausal and female climacteric states: Secondary | ICD-10-CM | POA: Diagnosis not present

## 2014-04-19 DIAGNOSIS — Z21 Asymptomatic human immunodeficiency virus [HIV] infection status: Secondary | ICD-10-CM

## 2014-04-19 DIAGNOSIS — K141 Geographic tongue: Secondary | ICD-10-CM | POA: Diagnosis not present

## 2014-04-19 LAB — CBC WITH DIFFERENTIAL/PLATELET
Basophils Absolute: 0.1 10*3/uL (ref 0.0–0.1)
Basophils Relative: 1 % (ref 0–1)
Eosinophils Absolute: 0.4 10*3/uL (ref 0.0–0.7)
Eosinophils Relative: 5 % (ref 0–5)
HCT: 41.8 % (ref 36.0–46.0)
Hemoglobin: 13.6 g/dL (ref 12.0–15.0)
Lymphocytes Relative: 33 % (ref 12–46)
Lymphs Abs: 2.4 10*3/uL (ref 0.7–4.0)
MCH: 28.9 pg (ref 26.0–34.0)
MCHC: 32.5 g/dL (ref 30.0–36.0)
MCV: 88.9 fL (ref 78.0–100.0)
MONO ABS: 0.7 10*3/uL (ref 0.1–1.0)
MONOS PCT: 10 % (ref 3–12)
MPV: 9.6 fL (ref 8.6–12.4)
NEUTROS ABS: 3.7 10*3/uL (ref 1.7–7.7)
Neutrophils Relative %: 51 % (ref 43–77)
PLATELETS: 276 10*3/uL (ref 150–400)
RBC: 4.7 MIL/uL (ref 3.87–5.11)
RDW: 14.8 % (ref 11.5–15.5)
WBC: 7.2 10*3/uL (ref 4.0–10.5)

## 2014-04-19 NOTE — Progress Notes (Signed)
Patient ID: Courtney Hamilton, female   DOB: September 13, 1960, 54 y.o.   MRN: [id number]       Patient ID: Courtney Hamilton, female   DOB: 1960-09-01, 54 y.o.   MRN: [id number]  HPI Courtney Hamilton is a 54 yo F with HIV disease, CD 4 count of 370/VL<20(nov 2015) on atripla. Doing well except noticing having hotflashes 2-3 x per day. Has good pain management routine through pain clinic with tramadol and flexeril  Outpatient Encounter Prescriptions as of 04/19/2014  Medication Sig  . ATRIPLA 600-200-300 MG per tablet TAKE 1 TABLET BY MOUTH AT BEDTIME.  Marland Kitchen Biotin 5000 MCG CAPS Take 5,000 mcg by mouth daily.  . Calcium-Magnesium-Zinc 167-83-8 MG TABS Take 1 tablet by mouth daily.  . clotrimazole (MYCELEX) 10 MG troche Take 10 mg by mouth as needed (thrush).   . Cyanocobalamin 1000 MCG SUBL Place 1,000 mcg under the tongue daily.   . cyclobenzaprine (FLEXERIL) 10 MG tablet Take 1 tablet (10 mg total) by mouth 3 (three) times daily as needed for muscle spasms.  . diclofenac sodium (VOLTAREN) 1 % GEL Apply 2 g topically 4 (four) times daily. (Patient taking differently: Apply 1 application topically 4 (four) times daily as needed (arthritis pain). )  . fluconazole (DIFLUCAN) 100 MG tablet Take 100 mg by mouth daily as needed (thrush).  . imiquimod (ALDARA) 5 % cream APPLY TOPICALLY 3 (THREE) TIMES A WEEK.  . naproxen sodium (ANAPROX) 220 MG tablet Take 220 mg by mouth 2 (two) times daily as needed (pain).  . Polyvinyl Alcohol-Povidone (MURINE TEARS FOR DRY EYES OP) Place 1 drop into both eyes daily as needed (dry eyes).  . traMADol (ULTRAM) 50 MG tablet Take 2 tablets (100 mg total) by mouth every 8 (eight) hours as needed. (Patient taking differently: Take 100 mg by mouth every 4 (four) hours as needed (pain). )  . traZODone (DESYREL) 50 MG tablet Take 1 tablet (50 mg total) by mouth at bedtime.  . vitamin C (ASCORBIC ACID) 500 MG tablet Take 500 mg by mouth daily.  . vitamin E 400 UNIT capsule Take 400 Units by mouth  daily.  Marland Kitchen albuterol (PROVENTIL HFA;VENTOLIN HFA) 108 (90 BASE) MCG/ACT inhaler Inhale 2 puffs into the lungs every 6 (six) hours as needed for wheezing or shortness of breath. (Patient not taking: Reported on 04/19/2014)  . aspirin 81 MG tablet Take 1 tablet (81 mg total) by mouth every other day. (Patient not taking: Reported on 04/19/2014)  . azithromycin (ZITHROMAX) 250 MG tablet Take 1 tablet (250 mg total) by mouth daily. (Patient not taking: Reported on 04/01/2014)  . fluconazole (DIFLUCAN) 100 MG tablet TAKE 1 TABLET (100 MG TOTAL) BY MOUTH DAILY. (Patient not taking: Reported on 04/01/2014)  . [DISCONTINUED] imiquimod (ALDARA) 5 % cream Apply 1 application topically daily as needed (skin cancer treatment).     Patient Active Problem List   Diagnosis Date Noted  . Ready to quit smoking 04/06/2014  . Anxiety state 01/07/2014  . Panic attacks 12/22/2013  . Lumbosacral spondylosis without myelopathy 09/29/2013  . 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 Lumbar paraspinal muscle spasm 01/16/2013  . Lower GI bleed 05/10/2012  . Reflux 12/13/2011  . Dry skin 12/13/2011  . Herpes simplex 10/07/2011  . Peripheral neuropathy 10/07/2011  . Hypercholesteremia 10/07/2011  . History of tuberculosis exposure 10/01/2011  . Thrush, oral 10/01/2011  . Piriformis syndrome of right side 09/17/2011  . Recurrent UTI 08/06/2011  . HIV positive 08/03/2011  . Sleep  disorder 08/03/2011  . Back pain 07/21/2011  . Osteoporosis 03/09/2008     There are no preventive care reminders to display for this patient.   Review of Systems 10 point ros is negative except what is mentioned in hpi Physical Exam   BP 124/88 mmHg  Pulse 88  Temp(Src) 98.3 F (36.8 C) (Oral)  Ht 5\' 6"  (1.676 m)  Wt 170 lb (77.111 kg)  BMI 27.45 kg/m2 Physical Exam  Constitutional:  oriented to person, place, and time. appears well-developed and well-nourished. No distress.  HENT:  Mouth/Throat: Oropharynx is clear  and moist. No oropharyngeal exudate.  Cardiovascular: Normal rate, regular rhythm and normal heart sounds. Exam reveals no gallop and no friction rub.  No murmur heard.  Pulmonary/Chest: Effort normal and breath sounds normal. No respiratory distress.  has no wheezes.  Abdominal: Soft. Bowel sounds are normal.  exhibits no distension. There is no tenderness.  Lymphadenopathy: no cervical adenopathy.  Neurological: alert and oriented to person, place, and time.  Skin: Skin is warm and dry. No rash noted. No erythema.  Psychiatric: a normal mood and affect. behavior is normal.   Lab Results  Component Value Date   CD4TCELL 13* 01/17/2014   Lab Results  Component Value Date   CD4TABS 370* 01/17/2014   CD4TABS 250* 09/18/2013   CD4TABS 200* 04/12/2013   Lab Results  Component Value Date   HIV1RNAQUANT <20 01/17/2014   Lab Results  Component Value Date   HEPBSAB NEG 10/01/2011   No results found for: RPR  CBC Lab Results  Component Value Date   WBC 8.6 04/01/2014   RBC 4.49 04/01/2014   HGB 13.0 04/01/2014   HCT 39.8 04/01/2014   PLT 257 04/01/2014   MCV 88.6 04/01/2014   MCH 29.0 04/01/2014   MCHC 32.7 04/01/2014   RDW 13.9 04/01/2014   LYMPHSABS 2.8 01/17/2014   MONOABS 0.6 01/17/2014   EOSABS 0.4 01/17/2014   BASOSABS 0.1 01/17/2014   BMET Lab Results  Component Value Date   NA 142 04/01/2014   K 3.9 04/01/2014   CL 106 04/01/2014   CO2 29 04/01/2014   GLUCOSE 90 04/01/2014   BUN 6 04/01/2014   CREATININE 0.79 04/01/2014   CALCIUM 9.3 04/01/2014   GFRNONAA >90 04/01/2014   GFRAA >90 04/01/2014     Assessment and Plan   hiv disease= well controled will get labs today. Not having any side effect. Did introduce to her to considering changing to genvoya, benefits of TAF  Thrush  Vs. Geographic tongue= can use mycolex troche to see if it improves  Hot flash = will refer to ob/gyn for evaluation and treatment of menopause

## 2014-04-20 LAB — COMPLETE METABOLIC PANEL WITH GFR
ALBUMIN: 4.1 g/dL (ref 3.5–5.2)
ALK PHOS: 123 U/L — AB (ref 39–117)
ALT: 9 U/L (ref 0–35)
AST: 6 U/L (ref 0–37)
BUN: 9 mg/dL (ref 6–23)
CO2: 27 mEq/L (ref 19–32)
Calcium: 9.2 mg/dL (ref 8.4–10.5)
Chloride: 105 mEq/L (ref 96–112)
Creat: 0.67 mg/dL (ref 0.50–1.10)
Glucose, Bld: 66 mg/dL — ABNORMAL LOW (ref 70–99)
POTASSIUM: 4.1 meq/L (ref 3.5–5.3)
SODIUM: 140 meq/L (ref 135–145)
TOTAL PROTEIN: 7.1 g/dL (ref 6.0–8.3)
Total Bilirubin: 0.3 mg/dL (ref 0.2–1.2)

## 2014-04-20 LAB — T-HELPER CELL (CD4) - (RCID CLINIC ONLY)
CD4 % Helper T Cell: 12 % — ABNORMAL LOW (ref 33–55)
CD4 T Cell Abs: 270 /uL — ABNORMAL LOW (ref 400–2700)

## 2014-04-21 LAB — HIV-1 RNA QUANT-NO REFLEX-BLD
HIV 1 RNA Quant: <20 copies/mL (ref <20)
HIV-1 RNA Quant, Log: <1.3 {Log} (ref <1.30)

## 2014-05-06 ENCOUNTER — Other Ambulatory Visit: Payer: Self-pay | Admitting: Family Medicine

## 2014-05-31 ENCOUNTER — Other Ambulatory Visit: Payer: Self-pay | Admitting: Internal Medicine

## 2014-05-31 DIAGNOSIS — Z803 Family history of malignant neoplasm of breast: Secondary | ICD-10-CM

## 2014-05-31 DIAGNOSIS — N6489 Other specified disorders of breast: Secondary | ICD-10-CM

## 2014-06-04 ENCOUNTER — Other Ambulatory Visit: Payer: Self-pay | Admitting: Internal Medicine

## 2014-06-04 ENCOUNTER — Ambulatory Visit (INDEPENDENT_AMBULATORY_CARE_PROVIDER_SITE_OTHER): Payer: Medicare HMO | Admitting: Family Medicine

## 2014-06-04 ENCOUNTER — Encounter: Payer: Self-pay | Admitting: Family Medicine

## 2014-06-04 VITALS — BP 120/78 | HR 67 | Temp 98.3°F | Ht 66.0 in | Wt 169.1 lb

## 2014-06-04 DIAGNOSIS — F411 Generalized anxiety disorder: Secondary | ICD-10-CM | POA: Diagnosis not present

## 2014-06-04 DIAGNOSIS — G44319 Acute post-traumatic headache, not intractable: Secondary | ICD-10-CM

## 2014-06-04 DIAGNOSIS — R51 Headache: Secondary | ICD-10-CM

## 2014-06-04 DIAGNOSIS — R519 Headache, unspecified: Secondary | ICD-10-CM | POA: Insufficient documentation

## 2014-06-04 MED ORDER — PAROXETINE HCL 20 MG PO TABS: 20.0000 mg | ORAL_TABLET | Freq: Every day | ORAL | Status: DC

## 2014-06-04 NOTE — Assessment & Plan Note (Addendum)
Patient's headache has resolved with use of ibuprofen. Neuro exam normal today is completely normal. Imaging is not necessary at this time. Reassured patient, explained red flags on when to return if needed.

## 2014-06-04 NOTE — Progress Notes (Signed)
Subjective:    Patient ID: Courtney Hamilton, female    DOB: 04/02/1960, 54 y.o.   MRN: [id number]  HPI  Head trauma: Patient presents to family medicine clinic for days after being hit in the back of the head. Patient states she was pulling out a roll away bed, she saw a piece of paper under and reached under to grab it, when her grandchild jumped on top of the bed and the bar came down her head. She denies any loss of consciousness, bruising or tenderness to the area. She admits to occasionally sharp pain that goes down her neck to her shoulders, and mild headache. She is on no blood thinners, no visual changes, no neurological signs. Saturday she started taking Advil for the headache, and her symptoms have completely resolved. She also takes Flexeril 10 mg daily at bedtime, which is also helped.  Anxiety: Patient states the approximately one month ago she was started on trazodone for her anxiety. She does not feel that it is working well for her. She states that she is worrying about many different types of things, including even going to the grocery store, what people think about her, her health etc. These are things that she normally doesn't have much anxiety surrounding them. Patient states she feels nervous, anxious and on edge, not being able to stop or control worrying over half the days of the last few weeks. She has trouble relaxing every day. She has trouble being so restless that is hard to stand still over half the days. She has noticed that she's becoming easily annoyed or irritable, feeling afraid of something awful might happen. She finds it somewhat to very difficult to perform her daily activities. She does admit to feeling little interest or pleasure in doing things more than half the days. In several days she has trouble falling asleep or staying asleep too much, feeling tired and having little energy, poor appetite or overeating, trouble concentrating on things, moving or speaking so  slowly that other people have noticed. She denies SI or HI.  Current every day smoker Past Medical History  Diagnosis Date  . HIV (human immunodeficiency virus infection)   . Sciatica   . Ruptured lumbar disc   . Osteoporosis   . 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 TB (pulmonary tuberculosis) 1993     exposure, treated   . Herniated disc 10/07/2011   Allergies  Allergen Reactions  . Baclofen Other (See Comments)    Perioral paresthesia  . Bactrim [Sulfamethoxazole-Trimethoprim] Rash  . Doxycycline Rash   Review of Systems Per history of present illness    Objective:   Physical Exam BP 120/78 mmHg  Pulse 67  Temp(Src) 98.3 F (36.8 C) (Oral)  Ht 5\' 6"  (1.676 m)  Wt 169 lb 1.6 oz (76.703 kg)  BMI 27.31 kg/m2 Gen: Tearful, anxious. No acute distress, nontoxic appearance, well-developed, well-nourished. African-American female, very pleasant. HEENT: AT. [place]. No erythema, no bruising or soft tissue swelling. No lacerations. No bony tenderness of the cervical spine. Mild left trapezius ropiness. Full range of motion of cervical spine without discomfort in flexion, extension, side bending and rotation. Bilateral TM visualized and normal in appearance. Bilateral eyes without injections or icterus Neuro: Normal gait. PERLA. EOMi. Alert. Cranial nerves II through XII intact. Muscle strength 5/5 upper and lower extremities. Deep tendon reflexes intact bilateral upper and lower extremity. Psych: Tearful, anxious. Normal speech. No SI or  HI PH Q: 7 GAD:11       Assessment & Plan:

## 2014-06-04 NOTE — Patient Instructions (Signed)
Your exam today for your neck and head was normal. If it your pain returns, or increases then please make an appointment to be seen again. We will start Paxil, for your generalized anxiety disorder today. Will need to see you back in 4 weeks to see how you're doing on this dose. Generalized Anxiety Disorder Generalized anxiety disorder (GAD) is a mental disorder. It interferes with life functions, including relationships, work, and school. GAD is different from normal anxiety, which everyone experiences at some point in their lives in response to specific life events and activities. Normal anxiety actually helps [place] prepare for and get through these life events and activities. Normal anxiety goes away after the event or activity is over.  GAD causes anxiety that is not necessarily related to specific events or activities. It also causes excess anxiety in proportion to specific events or activities. The anxiety associated with GAD is also difficult to control. GAD can vary from mild to severe. People with severe GAD can have intense waves of anxiety with physical symptoms (panic attacks).  SYMPTOMS The anxiety and worry associated with GAD are difficult to control. This anxiety and worry are related to many life events and activities and also occur more days than not for 6 months or longer. People with GAD also have three or more of the following symptoms (one or more in children):  Restlessness.   Fatigue.  Difficulty concentrating.   Irritability.  Muscle tension.  Difficulty sleeping or unsatisfying sleep. DIAGNOSIS GAD is diagnosed through an assessment by your health care provider. Your health care provider will ask you questions aboutyour mood,physical symptoms, and events in your life. Your health care provider may ask you about your medical history and use of alcohol or drugs, including prescription medicines. Your health care provider may also do a physical exam and blood tests.  Certain medical conditions and the use of certain substances can cause symptoms similar to those associated with GAD. Your health care provider may refer you to a mental health specialist for further evaluation. TREATMENT The following therapies are usually used to treat GAD:   Medication. Antidepressant medication usually is prescribed for long-term daily control. Antianxiety medicines may be added in severe cases, especially when panic attacks occur.   Talk therapy (psychotherapy). Certain types of talk therapy can be helpful in treating GAD by providing support, education, and guidance. A form of talk therapy called cognitive behavioral therapy can teach you healthy ways to think about and react to daily life events and activities.  Stress managementtechniques. These include yoga, meditation, and exercise and can be very helpful when they are practiced regularly. A mental health specialist can help determine which treatment is best for you. Some people see improvement with one therapy. However, other people require a combination of therapies. Document Released: 06/20/2012 Document Revised: 07/10/2013 Document Reviewed: 06/20/2012 [facility] Patient Information 2015 [place], [place]. This information is not intended to replace advice given to you by your health care provider. Make sure you discuss any questions you have with your health care provider.

## 2014-06-04 NOTE — Assessment & Plan Note (Signed)
Started Paxil 20 mg daily today Discussed the need to allow for 4-6 weeks to fill full benefit from dose each. Discussed with patient will likely need to be on medications for at least 6 months, at that time we can evaluate for further need.  follow-up in 4 weeks.

## 2014-06-06 ENCOUNTER — Ambulatory Visit
Admission: RE | Admit: 2014-06-06 | Discharge: 2014-06-06 | Disposition: A | Discharge status: Home / Self Care | Payer: Medicare HMO | Source: Ambulatory Visit | Attending: Internal Medicine | Admitting: Internal Medicine

## 2014-06-06 DIAGNOSIS — N6489 Other specified disorders of breast: Secondary | ICD-10-CM

## 2014-06-06 DIAGNOSIS — Z803 Family history of malignant neoplasm of breast: Secondary | ICD-10-CM

## 2014-06-27 ENCOUNTER — Encounter: Payer: Self-pay | Admitting: Family Medicine

## 2014-06-27 ENCOUNTER — Ambulatory Visit (INDEPENDENT_AMBULATORY_CARE_PROVIDER_SITE_OTHER): Payer: Medicare HMO | Admitting: Family Medicine

## 2014-06-27 VITALS — BP 112/76 | HR 71 | Temp 98.1°F | Ht 66.0 in | Wt 169.0 lb

## 2014-06-27 DIAGNOSIS — M5440 Lumbago with sciatica, unspecified side: Secondary | ICD-10-CM

## 2014-06-27 DIAGNOSIS — F411 Generalized anxiety disorder: Secondary | ICD-10-CM

## 2014-06-27 MED ORDER — IBUPROFEN 400 MG PO TABS: 400.0000 mg | ORAL_TABLET | Freq: Three times a day (TID) | ORAL | Status: DC | PRN

## 2014-06-27 NOTE — Assessment & Plan Note (Signed)
Patient self discontinued Paxil. No red flags.   -Continue exercise for anxiety relief -Consider Celexa or Lexapro instead Paxil if need for pharmacotherapy.

## 2014-06-27 NOTE — Assessment & Plan Note (Signed)
>>  ASSESSMENT AND PLAN FOR BACK PAIN WRITTEN ON 06/27/2014  4:32 PM BY Ronnie Doss M, DO  Discussed flexeril vs motrin with patient.  There is an increased risk of kidney toxicity with motrin in the setting of concurrent use of Tenovir, which was discussed with patient.  Patient would still like Motrin instead of Flexeril. -Reviewed last CMP, Cr 0.67 -Motrin 400 TID PRN LBP.  Anticipate that patient will not need this more than 3x weekly, after workouts. -STOP flexeril, as this is not improving pain -Patient to update her Pain management provider, ID provider on new medication -Plan to recheck patient's kidney function in 1-2 months. -patient voices good understanding of plan. -Discussed case with Dr Valentina Lucks

## 2014-06-27 NOTE — Assessment & Plan Note (Signed)
Discussed flexeril vs motrin with patient.  There is an increased risk of kidney toxicity with motrin in the setting of concurrent use of Tenovir, which was discussed with patient.  Patient would still like Motrin instead of Flexeril. -Reviewed last CMP, Cr 0.67 -Motrin 400 TID PRN LBP.  Anticipate that patient will not need this more than 3x weekly, after workouts. -STOP flexeril, as this is not improving pain -Patient to update her Pain management provider, ID provider on new medication -Plan to recheck patient's kidney function in 1-2 months. -patient voices good understanding of plan. -Discussed case with Dr Valentina Lucks

## 2014-06-27 NOTE — Patient Instructions (Addendum)
It was a pleasure seeing you today, Ms Hoiland!  Information regarding what we discussed is included in this packet.  Please make an appointment to see me in 1-2 months.  We will check your kidney function on Motrin at that time.  Please feel free to call our office at [phone] if any questions or concerns arise.  Warm Regards, Ashly M. Lajuana Ripple, DO

## 2014-06-27 NOTE — Progress Notes (Signed)
Patient ID: Courtney Hamilton, female   DOB: October 27, 1960, 54 y.o.   MRN: [id number]    Subjective: CC: anxiety and low back pain HPI: Patient is a 54 y.o. female presenting to clinic today for follow up visit. Concerns today include:  1. Anxiety Patient reports that her anxiety has gotten a lot better.  She reports that she was prescribed Paxil at her last appointment but stopped taking it after a week 2/2 feeling like a "zombie".  She states that she believes that her symptoms have improved because she is in a better place with her health/HIV.  She denies SI/HI, chest pressure, SOB.  2. Low back pain Patient reports that she has a h/o low back pain.  She reports that this seems worse when she goes to the gym.  She states that she tries not to over do things.  Working out about 2-3 times weekly with light weights and on the treadmill.  She reports that she gets an achy low back and hip pain that is NOT well relieved by Flexeril.  She feels that she becomes too sedated off of this medication.  Flexeril being rx'd by pain management clinic.  She thinks she just may be due for her back injections but states that she took a Motrin 800mg  given to her by a family member and that it really helped.  She would like to take Motrin instead of the Flexeril.  Denies falls, changes in sensation, weakness.  FamHx and MedHx updated.  Please see EMR.  ROS: All other systems reviewed and are negative.  Objective: Office vital signs reviewed. BP 112/76 mmHg  Pulse 71  Temp(Src) 98.1 F (36.7 C) (Oral)  Ht 5\' 6"  (1.676 m)  Wt 169 lb (76.658 kg)  BMI 27.29 kg/m2  SpO2 98%  Physical Examination:  General: Awake, alert, well nourished, NAD HEENT: Normal, EOMI Spine: no bony TTP, no bony abnormalities, mild flattening of the lumbar curve, AROM normal Extremities: WWP, No edema, cyanosis or clubbing MSK: Normal gait and station Neuro: Strength and sensation grossly intact Psych: good eye contact, normal  speech, mood stable, affect appropriate  CMP     Component Value Date/Time   NA 140 04/19/2014 1149   NA 136* 02/23/2011   K 4.1 04/19/2014 1149   CL 105 04/19/2014 1149   CO2 27 04/19/2014 1149   GLUCOSE 66* 04/19/2014 1149   BUN 9 04/19/2014 1149   CREATININE 0.67 04/19/2014 1149   CREATININE 0.79 04/01/2014 1645   CREATININE 0.7 02/23/2011   CALCIUM 9.2 04/19/2014 1149   PROT 7.1 04/19/2014 1149   ALBUMIN 4.1 04/19/2014 1149   AST 6 04/19/2014 1149   ALT 9 04/19/2014 1149   ALKPHOS 123* 04/19/2014 1149   BILITOT 0.3 04/19/2014 1149   GFRNONAA >89 04/19/2014 1149   GFRNONAA >90 04/01/2014 1645   GFRAA >89 04/19/2014 1149   GFRAA >90 04/01/2014 1645   Assessment: 54 y.o. female with h/o anxiety and low back pain  Plan: See Problem List and After Visit Summary   Janora Norlander, DO PGY-1, [place]

## 2014-07-09 ENCOUNTER — Ambulatory Visit (INDEPENDENT_AMBULATORY_CARE_PROVIDER_SITE_OTHER): Payer: Medicare HMO | Admitting: Family Medicine

## 2014-07-09 ENCOUNTER — Encounter: Payer: Self-pay | Admitting: Family Medicine

## 2014-07-09 VITALS — BP 125/84 | HR 69 | Temp 97.9°F | Ht 66.0 in | Wt 166.9 lb

## 2014-07-09 DIAGNOSIS — G44309 Post-traumatic headache, unspecified, not intractable: Secondary | ICD-10-CM | POA: Diagnosis not present

## 2014-07-09 NOTE — Progress Notes (Signed)
   Subjective:    Patient ID: Courtney Hamilton, female    DOB: 17-Dec-1960, 54 y.o.   MRN: [id number]  HPI 54 year old female presents for same day appointment for evaluation following recent head injury.  1) Head injury  Patient reports that she has recently bumped her head on the freezer door.  This occurred last Tuesday as well as this past Saturday.  She states that she was getting something out of the refrigerator and hit the back of her head on the freezer door.  She has had some mild headache/neck discomfort recently.  She's been taking ibuprofen with good relief in her pain. No exacerbating factors.  She is currently pain-free and a symptomatic.  Patient states that she was being cautious and came in for evaluation given her age.  Review of Systems  Constitutional: Negative.   Neurological: Negative.       Objective:   Physical Exam Filed Vitals:   07/09/14 0939  BP: 125/84  Pulse: 69  Temp: 97.9 F (36.6 C)   Vital signs reviewed.  Exam: General: well appearing, NAD. HEENT: NCAT. No evidence of bruising. PERRLA. Normal TMs bilaterally. Neck: Trapezius muscles with moderate tension. No focal spasm noted. Cardiovascular: RRR. No murmurs, rubs, or gallops. Respiratory: CTAB. No rales, rhonchi, or wheeze. Neuro: No focal deficits. Patient AO x 3.     Assessment & Plan:  See Problem List

## 2014-07-09 NOTE — Patient Instructions (Signed)
Your doing well.  Your exam was normal.  You do not have anything to be worried about regarding you hitting your head at this time.  Follow up with your Primary as indicated.  Take care  Dr. Lacinda Axon

## 2014-07-09 NOTE — Assessment & Plan Note (Signed)
She was recently bumped her head on the freezer door. She has had some subsequent headache. She is doing well at this time and is a symptomatic. Exam unremarkable. Advised supportive care.

## 2014-08-08 ENCOUNTER — Encounter: Payer: Self-pay | Admitting: Family Medicine

## 2014-08-08 ENCOUNTER — Ambulatory Visit (INDEPENDENT_AMBULATORY_CARE_PROVIDER_SITE_OTHER): Payer: Medicare HMO | Admitting: Family Medicine

## 2014-08-08 VITALS — BP 108/78 | HR 78 | Temp 98.1°F | Ht 66.0 in | Wt 169.0 lb

## 2014-08-08 DIAGNOSIS — M5440 Lumbago with sciatica, unspecified side: Secondary | ICD-10-CM

## 2014-08-08 DIAGNOSIS — Z0271 Encounter for disability determination: Secondary | ICD-10-CM | POA: Diagnosis not present

## 2014-08-08 NOTE — Patient Instructions (Signed)
It was a pleasure seeing you today, Ms Hargrove!  Information regarding what we discussed is included in this packet.  Call our office at [phone] if any questions or concerns arise.  Warm Regards, Ashly M. Lajuana Ripple, DO

## 2014-08-09 ENCOUNTER — Other Ambulatory Visit: Payer: Self-pay | Admitting: Family Medicine

## 2014-08-11 NOTE — Assessment & Plan Note (Signed)
>>  ASSESSMENT AND PLAN FOR BACK PAIN WRITTEN ON 08/11/2014 12:23 AM BY GOTTSCHALK, ASHLY M, DO  Stable.  Not in exacerbation today.  No red flags -Loan forms filled out at today's visit -Patient encouraged to continue following up with her pain clinic as scheduled -Patient encouraged to continue physical activity -PT would be a good option -Follow up as needed

## 2014-08-11 NOTE — Assessment & Plan Note (Addendum)
Stable.  Not in exacerbation today.  No red flags -Loan forms filled out at today's visit -Patient encouraged to continue following up with her pain clinic as scheduled -Patient encouraged to continue physical activity -PT would be a good option -Follow up as needed

## 2014-08-11 NOTE — Progress Notes (Signed)
Patient ID: Courtney Hamilton, female   DOB: Sep 30, 1960, 54 y.o.   MRN: [id number]    Subjective: CC: paperwork HPI: Patient is a 54 y.o. female presenting to clinic today for office visit. Concerns today include:  Patient presents to office with forms for loan forgiveness in the setting of disability.   She reports that she continues to receive disability.  She is no longer able to work because she cannot stand for long periods of time.  She has been thinking about getting PT but currently is exercising regularly at the gym to help relieve muscle tension.  Pain is in the low back and hips.  She sees pain management regularly for this.  She denies pain currently but expects to have some this evening as she has been watching her grandchildren all day.  She does not require aid for ambulation and performs ADLs independently.  She denies weakness, falls, or sensation changes.  FamHx and MedHx updated.  Please see EMR.  ROS: All other systems reviewed and are negative.  Objective: Office vital signs reviewed. BP 108/78 mmHg  Pulse 78  Temp(Src) 98.1 F (36.7 C) (Oral)  Ht 5\' 6"  (1.676 m)  Wt 169 lb (76.658 kg)  BMI 27.29 kg/m2  Physical Examination:  General: Awake, alert, well nourished, well appearing female, NAD HEENT: Normal, EOMI Cardio: RRR, S1S2 heard, no murmurs appreciated Pulm: CTAB, no wheezes, rhonchi or rales Extremities: WWP, No edema, cyanosis or clubbing; +2 pulses bilaterally MSK: Normal gait and station, FROM Neuro: Strength and sensation grossly intact  Assessment: 54 y.o. female with disability 2/2 chronic back pain  Plan: See Problem List and After Visit Summary  Courtney Norlander, DO PGY-1, [place]

## 2014-08-20 ENCOUNTER — Encounter: Payer: Medicare HMO | Attending: Physical Medicine & Rehabilitation

## 2014-08-20 ENCOUNTER — Ambulatory Visit (HOSPITAL_BASED_OUTPATIENT_CLINIC_OR_DEPARTMENT_OTHER): Payer: Medicare HMO | Admitting: Physical Medicine & Rehabilitation

## 2014-08-20 ENCOUNTER — Encounter: Payer: Self-pay | Admitting: Physical Medicine & Rehabilitation

## 2014-08-20 VITALS — BP 132/80 | HR 83 | Resp 16

## 2014-08-20 DIAGNOSIS — M47817 Spondylosis without myelopathy or radiculopathy, lumbosacral region: Secondary | ICD-10-CM | POA: Diagnosis not present

## 2014-08-20 DIAGNOSIS — M7711 Lateral epicondylitis, right elbow: Secondary | ICD-10-CM | POA: Diagnosis not present

## 2014-08-20 MED ORDER — TRAMADOL HCL 50 MG PO TABS: 100.0000 mg | ORAL_TABLET | Freq: Three times a day (TID) | ORAL | Status: DC | PRN

## 2014-08-20 NOTE — Progress Notes (Signed)
Subjective:    Patient ID: Courtney Hamilton, female    DOB: [birth year], 54 y.o.   MRN: [id number] Patient had L3 L4 L5 medial branch blocks 12/19/2013. Initially did not think they were helpful however in retrospect she could tell they were helpful and could tell when they started wearing off. Nevertheless overall improvement.  Has some right knee pain.   HPI Right knee pain improved with voltaren  Right elbow pain of 2 week duration, worsened with lifting grocery bags, no recent falls  Babysitting for son Pain Inventory Average Pain 8 Pain Right Now 8 My pain is sharp and burning  In the last 24 hours, has pain interfered with the following? General activity 8 Relation with others 4 Enjoyment of life 6 What TIME of day is your pain at its worst? evening Sleep (in general) Poor  Pain is worse with: bending and standing Pain improves with: heat/ice and medication Relief from Meds: 7  Mobility walk without assistance how many minutes can you walk? 15 ability to climb steps?  yes do you drive?  yes  Function disabled: date disabled .  Neuro/Psych No problems in this area  Prior Studies Any changes since last visit?  no  Physicians involved in your care Any changes since last visit?  no   Family History  Problem Relation Age of Onset  . Asthma Father   . Diabetes Brother   . Hypertension Brother    History   Social History  . Marital Status: Single    Spouse Name: N/A  . Number of Children: N/A  . Years of Education: N/A   Social History Main Topics  . Smoking status: Current Every Day Smoker -- 0.50 packs/day for 30 years    Types: Cigarettes  . Smokeless tobacco: Never Used     Comment: is trying to quit at this time  . Alcohol Use: No  . Drug Use: No     Comment: past history of cocaine  and alcohol    . Sexual Activity: No     Comment: declined condoms    Other Topics Concern  . None   Social History Narrative   Past Surgical History    Procedure Laterality Date  . Cholecystectomy    . Appendectomy    . Ectopic pregnancy surgery    . Salivary gland surgery     Past Medical History  Diagnosis Date  . HIV (human immunodeficiency virus infection)   . Sciatica   . Ruptured lumbar disc   . Osteoporosis   . 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 TB (pulmonary tuberculosis) 1993     exposure, treated   . Herniated disc 10/07/2011   BP 132/80 mmHg  Pulse 83  Resp 16  SpO2 100%  Opioid Risk Score:   Fall Risk Score: Low Fall Risk (0-5 points)`1  Depression screen PHQ 2/9  Depression screen [facility] 2/9 08/20/2014 08/08/2014 07/09/2014 06/27/2014 06/04/2014 04/19/2014 04/06/2014  Decreased Interest 0 0 0 0 0 0 0  Down, Depressed, Hopeless 0 0 0 1 0 0 0  PHQ - 2 Score 0 0 0 1 0 0 0  Altered sleeping 1 - - - - - -  Tired, decreased energy 1 - - - - - -  Change in appetite 0 - - - - - -  Feeling bad or failure about yourself  0 - - - - - -  Trouble concentrating 0 - - - - - -  Moving slowly or fidgety/restless 0 - - - - - -  Suicidal thoughts 0 - - - - - -  PHQ-9 Score 2 - - - - - -     Review of Systems  Musculoskeletal: Positive for back pain.  All other systems reviewed and are negative.      Objective:   Physical Exam  Constitutional: She appears well-developed and well-nourished.  HENT:  Head: Normocephalic and atraumatic.  Eyes: Conjunctivae and EOM are normal. Pupils are equal, round, and reactive to light.  Psychiatric: She has a normal mood and affect.  Nursing note and vitals reviewed.  Tenderness palpation lumbosacral junction Has good range of motion with lumbar flexion but has limited lumbar extension as well as limited lumbar lateral bending. No pain with knee range of motion No pain with elbow range of motion but does have tenderness over the right lateral epicondyle No evidence of tenderness over the right medial epicondyle       Assessment & Plan:  1. Lumbar  spondylosis with good relief following medial branch blocks this can be repeated however the patient does not wish scheduled current time.She has still some residual effect of this injection Continue tramadol 100 mg 3 times a day no signs of abuse no side effects thus far  2. Right lateral epicondylitis Voltaren gel, counterforce bracing recommended, Exercises given no exercise for 1 week until inflammatory state is under better control  Return to clinic 6 months or sooner if her lateral epicondylitis fails to improve

## 2014-08-20 NOTE — Patient Instructions (Signed)

## 2014-09-12 ENCOUNTER — Other Ambulatory Visit: Payer: Self-pay | Admitting: Family Medicine

## 2014-10-08 ENCOUNTER — Other Ambulatory Visit (HOSPITAL_COMMUNITY)
Admission: RE | Admit: 2014-10-08 | Discharge: 2014-10-08 | Disposition: A | Discharge status: Home / Self Care | Payer: Medicare HMO | Source: Ambulatory Visit | Attending: Family Medicine | Admitting: Family Medicine

## 2014-10-08 ENCOUNTER — Ambulatory Visit (INDEPENDENT_AMBULATORY_CARE_PROVIDER_SITE_OTHER): Payer: Medicare HMO | Admitting: Family Medicine

## 2014-10-08 ENCOUNTER — Encounter: Payer: Self-pay | Admitting: Family Medicine

## 2014-10-08 VITALS — Ht 66.0 in | Wt 170.0 lb

## 2014-10-08 DIAGNOSIS — R1030 Lower abdominal pain, unspecified: Secondary | ICD-10-CM

## 2014-10-08 DIAGNOSIS — R102 Pelvic and perineal pain: Secondary | ICD-10-CM | POA: Diagnosis not present

## 2014-10-08 DIAGNOSIS — K529 Noninfective gastroenteritis and colitis, unspecified: Secondary | ICD-10-CM

## 2014-10-08 DIAGNOSIS — R300 Dysuria: Secondary | ICD-10-CM

## 2014-10-08 DIAGNOSIS — Z113 Encounter for screening for infections with a predominantly sexual mode of transmission: Secondary | ICD-10-CM | POA: Insufficient documentation

## 2014-10-08 HISTORY — DX: Lower abdominal pain, unspecified: R10.30

## 2014-10-08 LAB — POCT URINALYSIS DIPSTICK
Bilirubin, UA: NEGATIVE
Glucose, UA: NEGATIVE
Ketones, UA: NEGATIVE
LEUKOCYTES UA: NEGATIVE
NITRITE UA: NEGATIVE
Protein, UA: NEGATIVE
Spec Grav, UA: 1.025
Urobilinogen, UA: 0.2
pH, UA: 6

## 2014-10-08 LAB — POCT UA - MICROSCOPIC ONLY

## 2014-10-08 LAB — POCT WET PREP (WET MOUNT): Clue Cells Wet Prep Whiff POC: NEGATIVE

## 2014-10-08 MED ORDER — ONDANSETRON HCL 4 MG PO TABS: 4.0000 mg | ORAL_TABLET | Freq: Three times a day (TID) | ORAL | Status: DC | PRN

## 2014-10-08 MED ORDER — DICYCLOMINE HCL 20 MG PO TABS
20.0000 mg | ORAL_TABLET | Freq: Four times a day (QID) | ORAL | Status: DC | PRN
Start: 2014-10-08 — End: 2015-02-18

## 2014-10-08 NOTE — Assessment & Plan Note (Addendum)
Clinically unclear etiology of subacute generalized abdominal pain / associated GI symptoms x 2 weeks. Currently benign abdomen on exam without reproducible +TTP, no localization. Constellation of GI symptoms, with wide differential. Mostly suggestive of a gastroenteritis/colitis, possibly infectious may be atypical in setting of HIV (however well controlled), loose stools not quite characteristic of diarrhea, assoc bloating / gassy, possible parasitic infection. Also considered diverticulitis flare (less likely without fever, rectal bleeding, localized abd pain). No significant GERD / reflux symptoms, less likely PUD (no regular NSAID use) but is assoc with eating however mild relief but then returns worse. No sick contacts, no significant vomiting less likely viral GI. - S/p choley and appendectomy. - UA not suggestive of infection, unlikely pyelo - Pelvic Exam - without significant discharge, pelvic pain but no specific CMT. No new STD exposure. Unlikely PID but known history >20 years ago.  Plan: 1. Work-up with stool studies: GI pathogen panel, stool culture, ova parasite, c.diff - ordered as future, advised to collect materials from lab return specimen. - Check GC/Chlamdyia for possible PID 2. Trial on Dicyclomine (Bentyl) 20mg  TID PRN GI spasm, as trial pain relief. Did not get relief from prior Tramadol or NSAIDs. Caution narcotics with unclear diagnosis. 3. Zofran PRN nausea / vomiting 4. Improve hydration, avoid trigger foods 5. May try OTC Omeprazole 20mg  daily for up to 2 weeks if develop GERD symptoms 6. RTC 2-3 days for close follow-up re-eval abd, may follow-up some stool results. Strict return criteria given if worsening, advised when to go to ED, may benefit from imaging / pain control 7. Follow-up with ID, given HIV may have additional recommendations

## 2014-10-08 NOTE — Progress Notes (Signed)
   Subjective:    Patient ID: Courtney Hamilton, female    DOB: 08-Mar-1961, 54 y.o.   MRN: [id number]  Patient presents for a same day appointment.  HPI  ABDOMINAL PAIN / GI UPSET / IRREGULAR BOWEL HABITS / DYSURIA / PELVIC PAIN: - Known PMH HIV positive, well controlled. followed by ID clinic, last eval few months ago. Continues Atripla - Reports symptoms started 2 weeks ago with "stomach problems", indigestion, constipation, gassy, with some intermittent lower abdominal cramping. Tried Mag-citrate. Bowel movements with regular and then loose dark BMs with some abd pain with BM. Associated mostly with gassy / bloating pain, and intermittent hunger associated with pain but improves with eating but then returns. Currently with abdominal pain, describes worsening over past 2 weeks, now with frequent persistent,  - History of PID 15-20 years ago, feels similar, but not currently having vaginal discharge, also remains sexually active with husband but no other change in sexual history or exposure STD. History of Diverticulosis (on Colonoscopy 05/2012) but no recent episodes of diverticulitis. No history of PUD. - Taking Pepto-bismol, tried probiotics - Tried Tramadol (for back, but no relief for stomach), tried Ibuprofen for 2 days then stopped - no relief - Admits associated LBP, nausea (without vomiting), dysuria x 1-2 times, loose dark BMs x 3 days - Denies fevers/chills, hematuria, vaginal discharge, odor, rectal bleeding  Surgical Hx: - S/p cholecystectomy, appendectomy, Ectopic pregnancy  I have reviewed and updated the following as appropriate: allergies and current medications  Social Hx: Active smoker, denies alcohol  Review of Systems  See above HPI    Objective:   Physical Exam  Ht 5\' 6"  (1.676 m)  Wt 170 lb (77.111 kg)  BMI 27.45 kg/m2  Gen - chronically ill appearing, moderate discomfort due to abdominal pain but cooperative HEENT - oropharynx clear, MMM Neck - supple,  non-tender Heart - RRR, no murmurs heard Lungs - CTAB, no wheezing, crackles, or rhonchi. Normal work of breathing. Abd - soft, generalized discomfort with exam but no reproducible +TTP localized to any quadrant, no rebound or guarding, no masses, +active BS Ext - non-tender, no edema, peripheral pulses intact +2 b/l Skin - warm, dry, no rashes Neuro - awake, alert, oriented, gait normal  Pelvic Exam - Normal external female genitalia. Vaginal canal without lesions. Normal appearing cervix, without lesions or bleeding. Physiologic discharge on exam. Bimanual exam with pelvic pain but no clinically significant masses or cervical motion tenderness.   Pelvic chaperoned by Andreas Newport, CMA     Assessment & Plan:   See specific A&P problem list for details.

## 2014-10-08 NOTE — Patient Instructions (Signed)
Dear Courtney Hamilton, Thank you for coming in to clinic today. It was good to see you!  1. For your symptoms, we will check some stool studies for a possible gastroenteritis / colitis infection 2. Try Dicyclomine for anti spasm medicine - take 1 capsule every 6 hours as needed, for up to 1 week 3. May continue Tylenol, plenty of fluids for hydration 4. Additionally can try over the counter Omeprazole for stomach acid, 20mg  daily for 2 weeks  Important to schedule follow-up with Infectious Disease doctors as well as soon as you can  Please schedule a follow-up appointment with Dr. Lajuana Ripple or myself in 2-3 days for follow-up  If you have any other questions or concerns, please feel free to call the clinic to contact me. You may also schedule an earlier appointment if necessary.  However, if your symptoms get significantly worse, please go to the Emergency Department to seek immediate medical attention.  Nobie Putnam, [place]

## 2014-10-09 LAB — CERVICOVAGINAL ANCILLARY ONLY
Chlamydia: NEGATIVE
NEISSERIA GONORRHEA: NEGATIVE

## 2014-10-09 NOTE — Addendum Note (Signed)
Addended by: Lianne Bushy on: 10/09/2014 11:36 AM   Modules accepted: Orders

## 2014-10-10 ENCOUNTER — Ambulatory Visit (INDEPENDENT_AMBULATORY_CARE_PROVIDER_SITE_OTHER): Payer: Medicare HMO | Admitting: Family Medicine

## 2014-10-10 ENCOUNTER — Encounter: Payer: Self-pay | Admitting: Family Medicine

## 2014-10-10 VITALS — BP 120/74 | HR 62 | Temp 98.4°F | Ht 66.0 in | Wt 168.4 lb

## 2014-10-10 DIAGNOSIS — G479 Sleep disorder, unspecified: Secondary | ICD-10-CM | POA: Diagnosis not present

## 2014-10-10 DIAGNOSIS — R1084 Generalized abdominal pain: Secondary | ICD-10-CM

## 2014-10-10 DIAGNOSIS — M62830 Muscle spasm of back: Secondary | ICD-10-CM

## 2014-10-10 DIAGNOSIS — Z21 Asymptomatic human immunodeficiency virus [HIV] infection status: Secondary | ICD-10-CM

## 2014-10-10 LAB — OVA AND PARASITE EXAMINATION: OP: NONE SEEN

## 2014-10-10 MED ORDER — TRAZODONE HCL 50 MG PO TABS: ORAL_TABLET | ORAL | Status: DC

## 2014-10-10 MED ORDER — CYCLOBENZAPRINE HCL 10 MG PO TABS: ORAL_TABLET | ORAL | Status: DC

## 2014-10-10 NOTE — Patient Instructions (Signed)
Your prescriptions have been sent in.  Gluten-Free Diet for Celiac Disease Gluten is a protein found in wheat, rye, barley, and triticale (a cross between wheat and rye) grains. People with celiac disease need to have a gluten-free diet. With celiac disease, gluten interferes with the absorption of food and may also cause intestinal injury.  Strict compliance is important even during symptom-free periods. This means eliminating all foods with gluten from your diet permanently. This requires some significant changes but is very manageable. WHAT DO I NEED TO KNOW ABOUT A GLUTEN-FREE DIET?  Look for items labeled with "GF." Looking for GF will make it easier to identify products that are safe to eat.  Read all labels. Gluten may have been added as a minor ingredient where least expected, such as in shredded cheeses or ice creams. Always check food labels and investigate questionable ingredients. Talk to your dietitian or health care provider if you have questions about certain foods or need help finding GF foods.  Check when in doubt. If you are not sure whether an ingredient contains gluten, check with the manufacturer. Note that some manufacturers may change ingredients without notice. Always read labels.   Know how food is prepared. Since flour and cereal products are often used in the preparation of foods, it is important to be aware of the methods of preparation used, as well as the ingredients in the foods themselves. This is especially true when you are dining out. Ask restaurants if they have a gluten-free menu.  Watch for cross-contamination. Cross-contamination occurs when gluten-free foods come into contact with foods that contain gluten. It often happens during the manufacturing process. Always check the ingredient list and for warnings on packages, such as "may contain gluten."  Eat a balanced diet. It is important to still get enough fiber, iron, and B vitamins in your diet. Look for  enriched whole grain gluten-free products and continue to eat a well-balanced diet of the important non-grain items, such as vegetables, fruit, lean proteins, legumes, and dairy.  Consider taking a gluten-free multivitamin and mineral supplement. Discuss this with your health care provider. WHAT KEY WORDS HELP IDENTIFY GLUTEN? Know key words to help identify gluten. A dietitian can help you identify possible harmful ingredients in the foods you normally eat. Words to check for on food labels include:   Flour, enriched flour, bromated flour, white flour, durum flour, graham flour, phosphated flour, self-rising flour, semolina, or farina.  Starch, dextrin, modified food starch, or cereal.  Thickening, fillers, or emulsifiers.  Any kind of malt flavoring, extract, or syrup (malt is made from barley and includes malt vinegar, malted milk, and malted beverages).  Hydrolyzed vegetable protein. WHAT FOODS CAN I EAT? Below is a list of common foods that are allowed with a gluten-free diet.  Grains Products made from the following flours or grains:amaranth,bean flours, 100% buckwheat flour, corn, millet, nut flours or meals, GF oats, quinoa, rice, sorghum, teff, any all-purpose 100% GF flour mix, rice wafers, pure cornmeal tortillas, popcorn, some crackers, some chips, and hot cereals made from cornmeal. Ask your dietitian which specific hot and cold cereals are allowed. Hominy, rice or wild rice, and special GF pasta. Some Asian rice noodles or bean noodles. Arrowroot starch, corn bran, corn flour, corn germ, cornmeal, corn starch, potato flour, potato starch flour, and rice bran. Rice flours: plain, brown, and sweet. Rice polish, soy flour, tapioca starch. Vegetables All plain, fresh, frozen, or canned vegetables.  Fruits All fresh, frozen, canned, dried fruits,  and fruit juices.  Meats and Other Protein Foods Meat, fish, poultry, or eggs prepared without added wheat, rye, barley, or triticale.  Some luncheon meat and some frankfurters. Pure meat. All aged cheese, most processed cheese products, some cottage cheese, and some cream cheese. Dried beans, dried peas, and lentils.  Dairy Milk and yogurt made with allowed ingredients.  Beverages Coffee (regular or decaffeinated), tea, herbal tea (read label to be sure that no wheat flour has been added). Carbonated beverages and some root beers. Wine, sake, and distilled spirits, such as gin, vodka, and whiskey. GF beers and GF ciders.  Sweetsand Desserts Sugar, honey, some syrups, molasses, jelly, jam, plain hard candy, marshmallows, gumdrops, homemade candies free of wheat, rye, barley, or triticale. Coconut. Custard, some pudding mixes, and homemade puddings from cornstarch, rice, and tapioca. Gelatin desserts, sorbets, frozen ice pops, and sherbet. Cake, cookies, and other desserts prepared with allowed flours. Some commercial ice creams. Ask your dietitian about specific brands of dessert that are allowed.  Fats and Oils Butter, margarine, vegetable oil, sour cream not containing modified food starch, whipping cream, shortening, lard, cream, and some mayonnaise. Some commercial salad dressings. Peanut butter.  Other Homemade broth and soups made with allowed ingredients; some canned or frozen soups. Any other combination or prepared foods that do not contain gluten. Monosodium glutamate (MSG). Cider, rice, and wine vinegar. Baking soda and baking powder. Certain soy sauces (Tamari). Ask your dietitian about specific brands that are allowed. Nuts, coconut, chocolate, and pure cocoa powder. Salt, pepper, herbs, spices, extracts, and food colorings. The items listed above may not be a complete list of allowed foods or beverages. Contact your dietitian for more options.  WHAT FOODS CAN I NOT EAT? Below is a list of common foods that are not allowed with a gluten-free diet.  Grains Barley, bran, bulgur, cracked wheat, graham, malt, matzo,  wheat germ, and all wheat and rye cereals including spelt and kamut. Avoid cereals containing malt as a flavoring, such as rice cereal. Also avoid regular noodles, spaghetti, macaroni, and most packaged rice mixes, and all others containing wheat, rye, barley, or triticale.  Vegetables Most creamed vegetables, most vegetables canned in sauces, and any vegetables prepared with wheat, rye, barley, or triticale.  Fruits Thickened or prepared fruits and some pie fillings.  Meats and Other Protein Sources Any meat or meat alternative containing wheat, rye, barley, or gluten stabilizers (such as some hot dogs, salami, cold cuts, or sausage). Bread-containing products, such as Swiss steak, croquettes, and meatloaf. Most tuna canned in vegetable broth, [place] with hydrolyzed vegetable protein (HVP) injected as part of the basting, and any cheese product containing oat gum as an ingredient. Seitan. Imitation fish. Dairy Commercial chocolate milk, which may have cereal added, and malted milk. Beverages Certain cereal beverages. Beer and ciders (unless GF), ale, malted milk, and some root beers. Sweetsand Desserts Commercial candies containing wheat, rye, barley, or triticale. Certain toffees are dusted with wheat flour. Chocolate-coated nuts, which are often rolled in flour. Cakes, cookies, doughnuts, and pastries that are prepared with wheat, barley, rye, or triticale flour. Some commercial ice creams, ice cream flavors which contain cookies, crumbs, or cheesecake. Ice cream cones. Commercially prepared mixes for cakes, cookies, and other desserts unless marked GF. Bread pudding and other puddings thickened with flour. Fats and Oils Some commercial salad dressings and sour cream containing modified food starch.  Condiments Some curry powder, some dry seasoning mixes, some gravy extracts, some meat sauces, some ketchup, some prepared  mustard, horseradish. Other All soups containing wheat, rye, barley,  or triticale flour. Bouillon and bouillon cubes that contain HVP. Combination or prepared foods that contain gluten. Some soy sauce, some chip dips, and some chewing gum. Yeast extract (contains barley). Caramel color (may contain malt). The items listed above may not be a complete list of foods and beverages to avoid. Contact your dietitian for more information. Document Released: 02/23/2005 Document Revised: 07/10/2013 Document Reviewed: 12/28/2012 [facility] Patient Information 2015 [place], [place]. This information is not intended to replace advice given to you by your health care provider. Make sure you discuss any questions you have with your health care provider.

## 2014-10-10 NOTE — Assessment & Plan Note (Signed)
Flexeril refilled x1 PRN low back spasm.

## 2014-10-10 NOTE — Assessment & Plan Note (Addendum)
Resolved.  Well relieved by Bentyl.  No recurrence.  No N/V/D or peritoneal signs on exam.  GC/CT reviewed.  Stool studies still pending. -F/u stools studies.   -Will consider referral to GI if continues to have episodes of abdominal pain and no infective etiology is revealed. -return precautions discussed.

## 2014-10-10 NOTE — Assessment & Plan Note (Signed)
Refill Trazodone.

## 2014-10-10 NOTE — Progress Notes (Signed)
Patient ID: Courtney Hamilton, female   DOB: [birth year], 54 y.o.   MRN: [id number]    Subjective: CH:ENIDPOEUM pain HPI: Patient is a 54 y.o. female presenting to clinic today for follow up. Concerns today include:  1. Abdominal pain Improved with Bentyl.  Has used 4 times so far.  Is working well.  Feels that abdominal pain may be related to stress/ lowered immune system.  Initially had nausea but has resolved.  Denies fevers, nausea, vomiting, diarrhea, hematochezia or melena.  Taking care of grand kids and none have been sick.    2. HIV Has not yet followed up with ID.  She is going today to make an appointment for HIV follow up.  No fevers.  Compliant with Atripla.    3. Muscle spasm Patient reports that she continues to have intermittent muscle spasms in her low back.  This is well relieved by flexeril.  Denies falls, bladder retention, fecal incontinence.  4. Sleep Trazodone works well to help her sleep.  Denies excessive drowsiness.  FamHx and MedHx updated.  Please see EMR.  ROS: All other systems reviewed and are negative.  Objective: Office vital signs reviewed. BP 120/74 mmHg  Pulse 62  Temp(Src) 98.4 F (36.9 C) (Oral)  Ht 5\' 6"  (1.676 m)  Wt 168 lb 6.4 oz (76.386 kg)  BMI 27.19 kg/m2  Physical Examination:  General: Awake, alert, well nourished, NAD HEENT: Normal, MMM Cardio: RRR, S1S2 heard, no murmurs appreciated Pulm: CTAB, no wheezes, rhonchi or rales, normal WOB GI: soft, NT/ND,+BS x4 MSK: Normal gait and station, no paraspinal TTP or midline TTP on exam today. Neuro: Strength and sensation grossly intact, no focal deficits  Labs reviewed today. GC/CT negative.  Stool studies pending.  Assessment: 54 y.o. female with resolved abdominal pain, HIV, muscle spasm in low back, sleep difficulties  Plan: See Problem List and After Visit Summary   Janora Norlander, DO PGY-2, [place]

## 2014-10-10 NOTE — Assessment & Plan Note (Signed)
Patient to follow up with ID for continued management/ labs -Continue Atripla as directed.

## 2014-10-11 ENCOUNTER — Encounter: Payer: Self-pay | Admitting: Family Medicine

## 2014-10-11 NOTE — Progress Notes (Signed)
Scott from [facility] left voice message stating they received an invalid specimen.  Reference number [id number].  Please call [phone] with questions.  Derl Barrow, RN

## 2014-10-11 NOTE — Progress Notes (Signed)
Last OV 10/08/14 for initial eval, has followed up on 8/3 with improvement now.  See below communication from [facility]:  ===View-only below this line===  ----- Message -----    From: [place] Pink, CMA    Sent: 10/11/2014  10:06 AM      To: Olin Hauser, DO Subject: LABS                                           Received a call from [facility] this morning, they are unable to run the C Diff because the stool sample was formed and not loose. It must be a loose sample for that particular test. They should be able to run everything else. If you want that test she will have to recollect a sample but again it must be loose stool.  -------------------------------  Clinically improved now.  Thanks for the information. No worries. I am not very concerned for C Diff if stools are well formed. I do not plan on re ordering C Diff test then, and it can be discussed at follow-up.  Nobie Putnam, [place], PGY-3

## 2014-10-12 LAB — GASTROINTESTINAL PATHOGEN PANEL PCR

## 2014-10-12 NOTE — Progress Notes (Signed)
Scott called back stating they were unable to perform the gastrointestinal panel due to the stool being to formed.  Derl Barrow, RN

## 2014-10-13 LAB — STOOL CULTURE

## 2014-11-14 ENCOUNTER — Other Ambulatory Visit: Payer: Self-pay | Admitting: Family Medicine

## 2014-11-14 ENCOUNTER — Ambulatory Visit: Payer: Medicare HMO | Admitting: Internal Medicine

## 2014-11-15 ENCOUNTER — Other Ambulatory Visit: Payer: Self-pay | Admitting: *Deleted

## 2014-11-15 DIAGNOSIS — G479 Sleep disorder, unspecified: Secondary | ICD-10-CM

## 2014-11-15 MED ORDER — TRAZODONE HCL 50 MG PO TABS: ORAL_TABLET | ORAL | Status: DC

## 2014-11-19 ENCOUNTER — Ambulatory Visit: Payer: Medicare HMO | Admitting: Internal Medicine

## 2014-12-18 ENCOUNTER — Other Ambulatory Visit: Payer: Self-pay | Admitting: Internal Medicine

## 2014-12-19 ENCOUNTER — Ambulatory Visit (INDEPENDENT_AMBULATORY_CARE_PROVIDER_SITE_OTHER): Payer: Medicare HMO | Admitting: Family Medicine

## 2014-12-19 ENCOUNTER — Encounter: Payer: Self-pay | Admitting: Family Medicine

## 2014-12-19 VITALS — BP 138/78 | HR 70 | Temp 97.9°F | Ht 66.0 in | Wt 173.3 lb

## 2014-12-19 DIAGNOSIS — R05 Cough: Secondary | ICD-10-CM

## 2014-12-19 DIAGNOSIS — R059 Cough, unspecified: Secondary | ICD-10-CM

## 2014-12-19 MED ORDER — ALBUTEROL SULFATE HFA 108 (90 BASE) MCG/ACT IN AERS: 2.0000 | INHALATION_SPRAY | Freq: Four times a day (QID) | RESPIRATORY_TRACT | Status: DC | PRN

## 2014-12-19 MED ORDER — PROMETHAZINE-PHENYLEPHRINE 6.25-5 MG/5ML PO SYRP: 5.0000 mL | ORAL_SOLUTION | Freq: Four times a day (QID) | ORAL | Status: DC | PRN

## 2014-12-19 MED ORDER — HYDROCODONE-HOMATROPINE 5-1.5 MG/5ML PO SYRP: 5.0000 mL | ORAL_SOLUTION | Freq: Three times a day (TID) | ORAL | Status: DC | PRN

## 2014-12-19 MED ORDER — AZITHROMYCIN 250 MG PO TABS: ORAL_TABLET | ORAL | Status: DC

## 2014-12-19 MED ORDER — ALBUTEROL SULFATE (2.5 MG/3ML) 0.083% IN NEBU
2.5000 mg | INHALATION_SOLUTION | Freq: Once | RESPIRATORY_TRACT | Status: AC
  Administered 2014-12-19: 2.5 mg via RESPIRATORY_TRACT

## 2014-12-19 NOTE — Patient Instructions (Signed)
Chest xray ordered. Go get this if you don't feel better.  Sent in cough medicine for you Take azithromycin - sent to your pharmacy Use albuterol inhaler - sent this as well  Follow up if not feeling better by Friday, sooner if getting worse.  Be well, Dr. Ardelia Mems

## 2014-12-19 NOTE — Progress Notes (Signed)
Patient ID: Courtney Hamilton, female   DOB: [birth year], 54 y.o.   MRN: [id number] Date of Visit: 12/19/2014   HPI:  Pt presents for a same day appointment to discuss cough.   Has had cough for 3-4 days. Worse at night. No fever. Productive of thick white sputum without blood. Also endorsing orthopnea. Unsure about wheezing. No sick contacts. Endorsing mild nausea but no vomiting. No stool or bladder changes. No nasal congestion. No appetite changes (chronically low appetite). Drinking liquids normally. No sick contacts. Endorses chronic night sweats which she attributes to menopause, worsened by this illness.  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 Gets bronchitis yearly, improves with azithromycin. For this episode has tried robitussin (helped a little), vicks vapor rub, cough drops, and tea.   She did also hit her head on a garage door this weekend, on the side of her head. No loss of consciousness. Having some residual neck pain. Pain gradually improving. Saw some light flashes on Sunday, none since. Vision is normal now.  ROS: See HPI  [place]: history of HIV (last CD4 count 270, on atripla therapy), osteoporosis, herpes, anxiety  PHYSICAL EXAM: BP 138/78 mmHg  Pulse 70  Temp(Src) 97.9 F (36.6 C) (Oral)  Ht 5\' 6"  (1.676 m)  Wt 173 lb 4.8 oz (78.608 kg)  BMI 27.98 kg/m2  Visual acuity: L 20/60  R 20/50  B 20/30  Gen: NAD, pleasant, cooperative, well appearing HEENT: normocephalic, atraumatic. tympanic membranes clear bilaterally. No cervical lymphadenopathy. pupils equal round and reactive to light. Oropharynx clear and moist.  Heart: regular rate and rhythm no murmur Lungs: inspiratory and expiratory wheezes throughout. No focal crackles. Wheezing improved after albuterol neb, still no crackles Abdomen: soft NTTp  Neuro: grossly nonfocal, speech normal, gait normal, alert and oriented  ASSESSMENT/PLAN:  1. Cough - wheezing noted on exam. Suspect bronchitis of likely viral etiology but  given immunocompromised status (HIV with last CD4 less than 300) reasonable to cover for atypical pneumonia. Clinically and symptomatically improved after administration of albuterol neb treatment. No hypoxia. Plan: - azithromycin - recommended CXR to patient, but she prefers to wait and see if she improves with therapy. Order entered, she will get CXR if not improving.  - Had planned to give codeine cough syrup, but patient declined this as she is on a pain contract with pain management. Will instead rx promethazine-phenylephrine - albuterol inhaler - return if not improving by Friday  2. Head trauma - no neurological deficits or loss of consciousness. Not on blood thinners. No signs of residual issues, just soreness. Continue to monitor.   FOLLOW UP: F/u as needed if symptoms worsen or do not improve.   [place]. Ardelia Mems, [place]

## 2014-12-28 ENCOUNTER — Encounter: Payer: Self-pay | Admitting: *Deleted

## 2015-01-03 ENCOUNTER — Ambulatory Visit (INDEPENDENT_AMBULATORY_CARE_PROVIDER_SITE_OTHER): Payer: Medicare HMO | Admitting: Internal Medicine

## 2015-01-03 ENCOUNTER — Encounter: Payer: Self-pay | Admitting: Internal Medicine

## 2015-01-03 VITALS — BP 108/68 | HR 66 | Temp 97.8°F | Wt 174.0 lb

## 2015-01-03 DIAGNOSIS — R69 Illness, unspecified: Secondary | ICD-10-CM | POA: Diagnosis not present

## 2015-01-03 DIAGNOSIS — Z79899 Other long term (current) drug therapy: Secondary | ICD-10-CM | POA: Diagnosis not present

## 2015-01-03 DIAGNOSIS — B20 Human immunodeficiency virus [HIV] disease: Secondary | ICD-10-CM

## 2015-01-03 DIAGNOSIS — Z23 Encounter for immunization: Secondary | ICD-10-CM

## 2015-01-03 LAB — COMPLETE METABOLIC PANEL WITH GFR
ALT: 11 U/L (ref 6–29)
AST: 17 U/L (ref 10–35)
Albumin: 3.9 g/dL (ref 3.6–5.1)
Alkaline Phosphatase: 118 U/L (ref 33–130)
BILIRUBIN TOTAL: 0.2 mg/dL (ref 0.2–1.2)
BUN: 9 mg/dL (ref 7–25)
CALCIUM: 8.8 mg/dL (ref 8.6–10.4)
CO2: 27 mmol/L (ref 20–31)
CREATININE: 0.69 mg/dL (ref 0.50–1.05)
Chloride: 105 mmol/L (ref 98–110)
GFR, Est Non African American: >89 mL/min (ref >=60)
Glucose, Bld: 89 mg/dL (ref 65–99)
Potassium: 4.4 mmol/L (ref 3.5–5.3)
Sodium: 139 mmol/L (ref 135–146)
TOTAL PROTEIN: 7.2 g/dL (ref 6.1–8.1)

## 2015-01-03 LAB — CBC WITH DIFFERENTIAL/PLATELET
BASOS ABS: 0.1 10*3/uL (ref 0.0–0.1)
Basophils Relative: 1 % (ref 0–1)
EOS PCT: 4 % (ref 0–5)
Eosinophils Absolute: 0.3 10*3/uL (ref 0.0–0.7)
HEMATOCRIT: 40.3 % (ref 36.0–46.0)
HEMOGLOBIN: 13.2 g/dL (ref 12.0–15.0)
LYMPHS ABS: 2.4 10*3/uL (ref 0.7–4.0)
LYMPHS PCT: 31 % (ref 12–46)
MCH: 28.9 pg (ref 26.0–34.0)
MCHC: 32.8 g/dL (ref 30.0–36.0)
MCV: 88.4 fL (ref 78.0–100.0)
MPV: 9.3 fL (ref 8.6–12.4)
Monocytes Absolute: 0.7 10*3/uL (ref 0.1–1.0)
Monocytes Relative: 9 % (ref 3–12)
NEUTROS PCT: 55 % (ref 43–77)
Neutro Abs: 4.3 10*3/uL (ref 1.7–7.7)
Platelets: 302 10*3/uL (ref 150–400)
RBC: 4.56 MIL/uL (ref 3.87–5.11)
RDW: 14.4 % (ref 11.5–15.5)
WBC: 7.8 10*3/uL (ref 4.0–10.5)

## 2015-01-03 LAB — LIPID PANEL
CHOLESTEROL: 230 mg/dL — AB (ref 125–200)
HDL: 41 mg/dL — ABNORMAL LOW (ref >=46)
LDL Cholesterol: 146 mg/dL — ABNORMAL HIGH (ref <130)
TRIGLYCERIDES: 215 mg/dL — AB (ref <150)
Total CHOL/HDL Ratio: 5.6 Ratio — ABNORMAL HIGH (ref <=5.0)
VLDL: 43 mg/dL — AB (ref <30)

## 2015-01-03 NOTE — Progress Notes (Signed)
Patient ID: Courtney Hamilton, female   DOB: May 25, 1960, 54 y.o.   MRN: [id number]     Rfv: hiv follow up  Patient ID: Courtney Hamilton, female   DOB: Nov 15, 1960, 54 y.o.   MRN: [id number]  HPI 54yo F with HIV disease, CD 4 count of 270/VL<20 on atripla. She has been taking atripla without difficulty though she reports having insomnia. She takes her meds at 8:30 pm and doesn't go to sleep until 1-2 am. Since we last saw her, she was previously treated for bronchitis/pneumonia.  Outpatient Encounter Prescriptions as of 01/03/2015  Medication Sig  . albuterol (PROVENTIL HFA;VENTOLIN HFA) 108 (90 BASE) MCG/ACT inhaler Inhale 2 puffs into the lungs every 6 (six) hours as needed for wheezing or shortness of breath.  . ATRIPLA 600-200-300 MG tablet TAKE 1 TABLET BY MOUTH AT BEDTIME.  Mar[place] Kitchen Biotin 5000 MCG CAPS Take 5,000 mcg by mouth daily.  . Calcium-Magnesium-Zinc 167-83-8 MG TABS Take 1 tablet by mouth daily.  . clotrimazole (MYCELEX) 10 MG troche Take 10 mg by mouth as needed (thrush).   . Cyanocobalamin 1000 MCG SUBL Place 1,000 mcg under the tongue daily.   . diclofenac sodium (VOLTAREN) 1 % GEL Apply 2 g topically 4 (four) times daily. (Patient taking differently: Apply 1 application topically 4 (four) times daily as needed (arthritis pain). )  . dicyclomine (BENTYL) 20 MG tablet Take 1 tablet (20 mg total) by mouth every 6 (six) hours as needed for spasms.  . ondansetron (ZOFRAN) 4 MG tablet Take 1 tablet (4 mg total) by mouth every 8 (eight) hours as needed for nausea or vomiting.  . traMADol (ULTRAM) 50 MG tablet Take 2 tablets (100 mg total) by mouth every 8 (eight) hours as needed.  . traZODone (DESYREL) 50 MG tablet TAKE 1 TABLET (50 MG TOTAL) BY MOUTH AT BEDTIME.  . vitamin C (ASCORBIC ACID) 500 MG tablet Take 500 mg by mouth daily.  . vitamin E 400 UNIT capsule Take 400 Units by mouth daily.  . [DISCONTINUED] azithromycin (ZITHROMAX) 250 MG tablet Take 2 tabs by mouth on day 1, followed by one tab  by mouth daily on days 2-5  . [DISCONTINUED] cyclobenzaprine (FLEXERIL) 10 MG tablet TAKE 1 TABLET BY MOUTH 3 TIMES A DAY AS NEEDED FOR MUSCLE SPASMS  . [DISCONTINUED] imiquimod (ALDARA) 5 % cream APPLY TOPICALLY 3 (THREE) TIMES A WEEK.  . [DISCONTINUED] Polyvinyl Alcohol-Povidone (MURINE TEARS FOR DRY EYES OP) Place 1 drop into both eyes daily as needed (dry eyes).  . [DISCONTINUED] promethazine-phenylephrine (PROMETHAZINE VC) 6.25-5 MG/5ML SYRP Take 5 mLs by mouth every 6 (six) hours as needed for congestion.   No facility-administered encounter medications on file as of 01/03/2015.     Patient Active Problem List   Diagnosis Date Noted  . Abdominal pain 10/08/2014  . Gastroenteritis, acute 10/08/2014  . Generalized anxiety disorder 06/04/2014  . Headache 06/04/2014  . Ready to quit smoking 04/06/2014  . Anxiety state 01/07/2014  . Panic attacks 12/22/2013  . Lumbosacral spondylosis without myelopathy 09/29/2013  . 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 Lumbar paraspinal muscle spasm 01/16/2013  . Lower GI bleed 05/10/2012  . Reflux 12/13/2011  . Herpes simplex 10/07/2011  . Peripheral neuropathy ([place]) 10/07/2011  . Hypercholesteremia 10/07/2011  . History of tuberculosis exposure 10/01/2011  . Recurrent UTI 08/06/2011  . HIV positive ([place]) 08/03/2011  . Sleep disorder 08/03/2011  . Back pain 07/21/2011  . Osteoporosis 03/09/2008     Health Maintenance Due  Topic Date Due  . INFLUENZA VACCINE  10/08/2014     Review of Systems Review of Systems  Constitutional: Negative for fever, chills, diaphoresis, activity change, appetite change, fatigue and unexpected weight change.  HENT: Negative for congestion, sore throat, rhinorrhea, sneezing, trouble swallowing and sinus pressure.  Eyes: Negative for photophobia and visual disturbance.  Respiratory: Negative for cough, chest tightness, shortness of breath, wheezing and stridor.  Cardiovascular: Negative for chest  pain, palpitations and leg swelling.  Gastrointestinal: Negative for nausea, vomiting, abdominal pain, diarrhea, constipation, blood in stool, abdominal distention and anal bleeding.  Genitourinary: Negative for dysuria, hematuria, flank pain and difficulty urinating.  Musculoskeletal: Negative for myalgias, back pain, joint swelling, arthralgias and gait problem.  Skin: Negative for color change, pallor, rash and wound.  Neurological: Negative for dizziness, tremors, weakness and light-headedness.  Hematological: Negative for adenopathy. Does not bruise/bleed easily.  Psychiatric/Behavioral: Negative for behavioral problems, confusion, sleep disturbance, dysphoric mood, decreased concentration and agitation.    Physical Exam   BP 108/68 mmHg  Pulse 66  Temp(Src) 97.8 F (36.6 C) (Oral)  Wt 174 lb (78.926 kg) Physical Exam  Constitutional:  oriented to person, place, and time. appears well-developed and well-nourished. No distress.  HENT: [place]/AT, PERRLA, no scleral icterus Mouth/Throat: Oropharynx is clear and moist. No oropharyngeal exudate.  Cardiovascular: Normal rate, regular rhythm and normal heart sounds. Exam reveals no gallop and no friction rub.  No murmur heard.  Pulmonary/Chest: Effort normal and breath sounds normal. No respiratory distress.  has no wheezes.  Neck = supple, no nuchal rigidity Lymphadenopathy: no cervical adenopathy. No axillary adenopathy Neurological: alert and oriented to person, place, and time.  Skin: Skin is warm and dry. No rash noted. No erythema.  Psychiatric: a normal mood and affect.  behavior is normal.   Lab Results  Component Value Date   CD4TCELL 12* 04/19/2014   Lab Results  Component Value Date   CD4TABS 270* 04/19/2014   CD4TABS 370* 01/17/2014   CD4TABS 250* 09/18/2013   Lab Results  Component Value Date   HIV1RNAQUANT <20 04/19/2014   Lab Results  Component Value Date   HEPBSAB NEG 10/01/2011   No results found for:  RPR  CBC Lab Results  Component Value Date   WBC 7.2 04/19/2014   RBC 4.70 04/19/2014   HGB 13.6 04/19/2014   HCT 41.8 04/19/2014   PLT 276 04/19/2014   MCV 88.9 04/19/2014   MCH 28.9 04/19/2014   MCHC 32.5 04/19/2014   RDW 14.8 04/19/2014   LYMPHSABS 2.4 04/19/2014   MONOABS 0.7 04/19/2014   EOSABS 0.4 04/19/2014   BASOSABS 0.1 04/19/2014   BMET Lab Results  Component Value Date   NA 140 04/19/2014   K 4.1 04/19/2014   CL 105 04/19/2014   CO2 27 04/19/2014   GLUCOSE 66* 04/19/2014   BUN 9 04/19/2014   CREATININE 0.67 04/19/2014   CALCIUM 9.2 04/19/2014   GFRNONAA >89 04/19/2014   GFRAA >89 04/19/2014     Assessment and Plan  hiv disease = well controlled with atripla. Will check labs since last checked in February. Discussed that there are alternatives that are not associated with insomnia/vivid dreams. She will think about it but recommended to take it closer to bedtime, she will move it to taking meds at 10pm. Will see her back in 3 months to see if willing to change to different regimen  Health maintenance = will give flu vaccine

## 2015-01-04 LAB — HIV-1 RNA QUANT-NO REFLEX-BLD: HIV-1 RNA Quant, Log: <1.3 Log copies/mL (ref <1.30)

## 2015-01-04 LAB — T-HELPER CELL (CD4) - (RCID CLINIC ONLY)
CD4 % Helper T Cell: 13 % — ABNORMAL LOW (ref 33–55)
CD4 T Cell Abs: 340 /uL — ABNORMAL LOW (ref 400–2700)

## 2015-01-04 LAB — RPR

## 2015-01-14 ENCOUNTER — Encounter: Payer: Self-pay | Admitting: Family Medicine

## 2015-01-14 ENCOUNTER — Ambulatory Visit (INDEPENDENT_AMBULATORY_CARE_PROVIDER_SITE_OTHER): Payer: Medicare HMO | Admitting: Family Medicine

## 2015-01-14 VITALS — BP 121/78 | HR 75 | Temp 97.7°F | Wt 171.7 lb

## 2015-01-14 DIAGNOSIS — Z21 Asymptomatic human immunodeficiency virus [HIV] infection status: Secondary | ICD-10-CM

## 2015-01-14 DIAGNOSIS — M5440 Lumbago with sciatica, unspecified side: Secondary | ICD-10-CM | POA: Diagnosis not present

## 2015-01-14 DIAGNOSIS — Z201 Contact with and (suspected) exposure to tuberculosis: Secondary | ICD-10-CM

## 2015-01-14 DIAGNOSIS — W19XXXA Unspecified fall, initial encounter: Secondary | ICD-10-CM | POA: Diagnosis not present

## 2015-01-14 MED ORDER — OXYCODONE-ACETAMINOPHEN 5-325 MG PO TABS: 1.0000 | ORAL_TABLET | Freq: Four times a day (QID) | ORAL | Status: DC | PRN

## 2015-01-14 NOTE — Assessment & Plan Note (Signed)
Reassuring exam overall. I am not worried about hip fracture. Possible ddx for pain in left side is rib bruising or fracture. Unlikely splenic in nature but will keep on ddx given location of pain. Do not feel CT scan to r/o splenic injury is warranted given currently vitals are stable and exam reassuring.  - Percocet #30 prn for pain - RTC in 1 week for recheck - Discussed that I would recommending going to ED for worsening left sided pain, inability to straighten abdomen as these could be signs of a spleen injury and she would need a CT scan.

## 2015-01-14 NOTE — Patient Instructions (Signed)
Fall Prevention in the Home  Falls can cause injuries and can affect people from all age groups. There are many simple things that you can do to make your home safe and to help prevent falls. WHAT CAN I DO ON THE OUTSIDE OF MY HOME?  Regularly repair the edges of walkways and driveways and fix any cracks.  Remove high doorway thresholds.  Trim any shrubbery on the main path into your home.  Use bright outdoor lighting.  Clear walkways of debris and clutter, including tools and rocks.  Regularly check that handrails are securely fastened and in good repair. Both sides of any steps should have handrails.  Install guardrails along the edges of any raised decks or porches.  Have leaves, snow, and ice cleared regularly.  Use sand or salt on walkways during winter months.  In the garage, clean up any spills right away, including grease or oil spills. WHAT CAN I DO IN THE BATHROOM?  Use night lights.  Install grab bars by the toilet and in the tub and shower. Do not use towel bars as grab bars.  Use non-skid mats or decals on the floor of the tub or shower.  If you need to sit down while you are in the shower, use a plastic, non-slip stool..  Keep the floor dry. Immediately clean up any water that spills on the floor.  Remove soap buildup in the tub or shower on a regular basis.  Attach bath mats securely with double-sided non-slip rug tape.  Remove throw rugs and other tripping hazards from the floor. WHAT CAN I DO IN THE BEDROOM?  Use night lights.  Make sure that a bedside light is easy to reach.  Do not use oversized bedding that drapes onto the floor.  Have a firm chair that has side arms to use for getting dressed.  Remove throw rugs and other tripping hazards from the floor. WHAT CAN I DO IN THE KITCHEN?   Clean up any spills right away.  Avoid walking on wet floors.  Place frequently used items in easy-to-reach places.  If you need to reach for something  above you, use a sturdy step stool that has a grab bar.  Keep electrical cables out of the way.  Do not use floor polish or wax that makes floors slippery. If you have to use wax, make sure that it is non-skid floor wax.  Remove throw rugs and other tripping hazards from the floor. WHAT CAN I DO IN THE STAIRWAYS?  Do not leave any items on the stairs.  Make sure that there are handrails on both sides of the stairs. Fix handrails that are broken or loose. Make sure that handrails are as long as the stairways.  Check any carpeting to make sure that it is firmly attached to the stairs. Fix any carpet that is loose or worn.  Avoid having throw rugs at the top or bottom of stairways, or secure the rugs with carpet tape to prevent them from moving.  Make sure that you have a light switch at the top of the stairs and the bottom of the stairs. If you do not have them, have them installed. WHAT ARE SOME OTHER FALL PREVENTION TIPS?  Wear closed-toe shoes that fit well and support your feet. Wear shoes that have rubber soles or low heels.  When you use a stepladder, make sure that it is completely opened and that the sides are firmly locked. Have someone hold the ladder while you   are using it. Do not climb a closed stepladder.  Add color or contrast paint or tape to grab bars and handrails in your home. Place contrasting color strips on the first and last steps.  Use mobility aids as needed, such as canes, walkers, scooters, and crutches.  Turn on lights if it is dark. Replace any light bulbs that burn out.  Set up furniture so that there are clear paths. Keep the furniture in the same spot.  Fix any uneven floor surfaces.  Choose a carpet design that does not hide the edge of steps of a stairway.  Be aware of any and all pets.  Review your medicines with your healthcare provider. Some medicines can cause dizziness or changes in blood pressure, which increase your risk of falling. Talk  with your health care provider about other ways that you can decrease your risk of falls. This may include working with a physical therapist or trainer to improve your strength, balance, and endurance.   This information is not intended to replace advice given to you by your health care provider. Make sure you discuss any questions you have with your health care provider.   Document Released: 02/13/2002 Document Revised: 07/10/2014 Document Reviewed: 03/30/2014 Elsevier Interactive Patient Education 2016 [employer] Inc.  

## 2015-01-14 NOTE — Progress Notes (Signed)
   Subjective:   Courtney Hamilton is a 54 y.o. female with a history of HIV, peripheral neuropathy, OA  Here for  Chief Complaint  Patient presents with  . Fall   Fell yesterday down 5-6 stairs, thinks she might have hit her left side. Hurt when she got up today.  Pain is located on left side  Hurts to touch and move mildly with deep breathing Reports she was able to bear weight She reports worse pain with movement Denies numbness or electrical shocks down leg Reports she took alleve and tramadol but they have not helped.  Pain is rated 10 of 10  There are no preventive care reminders to display for this patient.  Review of Systems:   Per HPI. All other systems reviewed and are negative expect as in HPI   PMH, PSH, Medications, Allergies, SocialHx and FHx reviewed and updated in EMR- marked as reviewed 01/14/2015   Objective:  BP 121/78 mmHg  Pulse 75  Temp(Src) 97.7 F (36.5 C) (Oral)  Wt 171 lb 11.2 oz (77.883 kg)  Gen:  54 y.o. female in NAD. Speaking in full sentences. Good eye contact HEENT: NCAT, MMM CV: RRR, no MRG, no JVD Resp: Normal WOB. Non-labored GI: Soft, NTND, BS present, no guarding or organomegaly. TTP off the lower rib and just over board. +voluntary guarding. No rebound.  Ext: WWP, no edema MSK: Intact gait. Full ROM. Negative FABER/FADIR. No leg length discrepancy.  Neuro: Alert and oriented, speech normal Pysch: Normal mood and affect.     Assessment:     Courtney Hamilton is a 54 y.o. female here for fall with left side pain    Plan:   Fall Reassuring exam overall. I am not worried about hip fracture. Possible ddx for pain in left side is rib bruising or fracture. Unlikely splenic in nature but will keep on ddx given location of pain. Do not feel CT scan to r/o splenic injury is warranted given currently vitals are stable and exam reassuring.  - Percocet #30 prn for pain - RTC in 1 week for recheck - Discussed that I would recommending going to ED  for worsening left sided pain, inability to straighten abdomen as these could be signs of a spleen injury and she would need a CT scan.   >50% of this 15 minute visit was spent discussing plan of care, treatment, warning signs and symptoms and reasons to return to care before scheduled appt.   Future Appointments Date Time Provider [place]  01/22/2015 11:00 AM Nicolette Bang, DO [facility]  02/18/2015 12:45 PM Charlett Blake, MD [id number] None    Caren Macadam, MD, [facility] 01/14/2015  4:55 PM

## 2015-01-22 ENCOUNTER — Encounter: Payer: Self-pay | Admitting: Internal Medicine

## 2015-01-22 ENCOUNTER — Ambulatory Visit (INDEPENDENT_AMBULATORY_CARE_PROVIDER_SITE_OTHER): Payer: Medicare HMO | Admitting: Internal Medicine

## 2015-01-22 VITALS — BP 138/84 | HR 66 | Temp 98.4°F | Wt 173.2 lb

## 2015-01-22 DIAGNOSIS — T148XXA Other injury of unspecified body region, initial encounter: Secondary | ICD-10-CM

## 2015-01-22 DIAGNOSIS — T148 Other injury of unspecified body region: Secondary | ICD-10-CM

## 2015-01-22 DIAGNOSIS — W19XXXD Unspecified fall, subsequent encounter: Secondary | ICD-10-CM

## 2015-01-22 MED ORDER — CYCLOBENZAPRINE HCL 10 MG PO TABS: 10.0000 mg | ORAL_TABLET | Freq: Three times a day (TID) | ORAL | Status: DC | PRN

## 2015-01-22 NOTE — Patient Instructions (Signed)
It was great to meet you today! I hope you feel better soon.  1. Take the Flexeril (muscle relaxant) as needed to help with your pulled muscles. Do not drive while taking this medication as it can make you sleepy.  2. Apply heat to the area if it provides you with relief.  3. Return to the clinic if pain not improved in 10-14 days.  4. Return to the clinic if your notice a nipple discharge, redness with warmth on the right breast compared to the left, or fevers as these may be signs of an infection.   Take care!  -Dr. Juleen China

## 2015-01-22 NOTE — Assessment & Plan Note (Signed)
Pain has improved since last visit to [facility]. Continues to have reassuring exam. TTP over left ribs indicate rib bruising or fracture 2/2 to fall. Continued normal abdominal exam and stable vitals are reassuring that pain is not splenic in nature.  -Continue Percocet prn  -Return for worsening left side pain

## 2015-01-22 NOTE — Progress Notes (Signed)
Subjective:    Courtney Hamilton - 54 y.o. female MRN [id number]  Date of birth: 1960-08-26  HPI  Courtney Hamilton is a 54 yo female with history of HIV, periphernal neuropathy, and OA here for follow up for recent fall. Fell on 11/6 and was seen at [facility] the following day. At that time, she had a reassuring exam overall with no concern for hip fracture. Was given Percocet #30 prn for pain.  Today, patient reports that pain from fall has greatly improved. Still has tender spot over the left lateral side. Is currently taking Percocet at night and still has some left over.   Additionally, patient presents with new concern of right breast pain radiating to shoulder and upper right back. Reports pain began 3 days ago after a day spent watching her 3 young grandchildren, as well as changing multiple air filters overhead. Notes that pain has improved since initial presentation. Had noticed some increased warmth of right breast compared to left. Denies nipple discharge and erythema of the breast. Patient has history of asymmetrical density in upper-outer right breast that is being followed by serial diagnostic mammograms. Density is believed to be benign, has been decreasing in size, and last diagnostic mammogram to be performed in March 2017.       Health Maintenance:  There are no preventive care reminders to display for this patient.  -  reports that she has been smoking Cigarettes.  She has a 15 pack-year smoking history. She has never used smokeless tobacco. - Review of Systems: Per HPI. - Past Medical History: Patient Active Problem List   Diagnosis Date Noted  . Muscle strain 01/22/2015  . Fall 01/14/2015  . Abdominal pain 10/08/2014  . Generalized anxiety disorder 06/04/2014  . Ready to quit smoking 04/06/2014  . Anxiety state 01/07/2014  . Panic attacks 12/22/2013  . Lumbosacral spondylosis without myelopathy 09/29/2013  . Osteoarthritis of [place] joint of thumb 04/18/2013  . Lumbar  paraspinal muscle spasm 01/16/2013  . Lower GI bleed 05/10/2012  . Reflux 12/13/2011  . Herpes simplex 10/07/2011  . Peripheral neuropathy ([place]) 10/07/2011  . Hypercholesteremia 10/07/2011  . History of tuberculosis exposure 10/01/2011  . Recurrent UTI 08/06/2011  . HIV positive ([place]) 08/03/2011  . Sleep disorder 08/03/2011  . Back pain 07/21/2011  . Osteoporosis 03/09/2008   - Medications: reviewed and updated Current Outpatient Prescriptions  Medication Sig Dispense Refill  . albuterol (PROVENTIL HFA;VENTOLIN HFA) 108 (90 BASE) MCG/ACT inhaler Inhale 2 puffs into the lungs every 6 (six) hours as needed for wheezing or shortness of breath. 1 Inhaler 0  . ATRIPLA 600-200-300 MG tablet TAKE 1 TABLET BY MOUTH AT BEDTIME. 30 tablet 11  . Biotin 5000 MCG CAPS Take 5,000 mcg by mouth daily.    . Calcium-Magnesium-Zinc 167-83-8 MG TABS Take 1 tablet by mouth daily.    . clotrimazole (MYCELEX) 10 MG troche Take 10 mg by mouth as needed (thrush).     . Cyanocobalamin 1000 MCG SUBL Place 1,000 mcg under the tongue daily.     . cyclobenzaprine (FLEXERIL) 10 MG tablet Take 1 tablet (10 mg total) by mouth 3 (three) times daily as needed for muscle spasms. 30 tablet 0  . diclofenac sodium (VOLTAREN) 1 % GEL Apply 2 g topically 4 (four) times daily. (Patient taking differently: Apply 1 application topically 4 (four) times daily as needed (arthritis pain). ) 3 Tube 3  . dicyclomine (BENTYL) 20 MG tablet Take 1 tablet (20 mg total) by mouth every  6 (six) hours as needed for spasms. 40 tablet 0  . ondansetron (ZOFRAN) 4 MG tablet Take 1 tablet (4 mg total) by mouth every 8 (eight) hours as needed for nausea or vomiting. 20 tablet 0  . oxyCODONE-acetaminophen (PERCOCET) 5-325 MG tablet Take 1 tablet by mouth every 6 (six) hours as needed for severe pain. 25 tablet 0  . traMADol (ULTRAM) 50 MG tablet Take 2 tablets (100 mg total) by mouth every 8 (eight) hours as needed. 180 tablet 5  . traZODone (DESYREL)  50 MG tablet TAKE 1 TABLET (50 MG TOTAL) BY MOUTH AT BEDTIME. 30 tablet 3  . vitamin C (ASCORBIC ACID) 500 MG tablet Take 500 mg by mouth daily.    . vitamin E 400 UNIT capsule Take 400 Units by mouth daily.     No current facility-administered medications for this visit.     Review of Systems See HPI     Objective:   Physical Exam BP 138/84 mmHg  Pulse 66  Temp(Src) 98.4 F (36.9 C) (Oral)  Wt 173 lb 3.2 oz (78.563 kg)  SpO2 97% Gen: NAD, alert, cooperative with exam, well-appearing CV: RRR, good S1/S2 Resp: CTABL, no wheezes, non-labored Breast: No erythema or increased warmth of right breast. No nipple discharge noted. No lymph nodes palpated in right axilla. TTP over right lateral pectoralis muscle. No appreciable masses palpated in breasts bilaterally.  Abd: SNTND, BS present, no guarding or organomegaly MSK: TTP to left lateral lower rib without mass, edema, or erythema. Trapezius muscle of right side with increased tightness and TTP compared to left trapezius. Full passive and active ROM of UE although with some pain in extreme abduction of right shoulder. Strength 5/5 in UE bilaterally. Empty can test negative.  Psych: good insight, alert and oriented     Assessment & Plan:   Fall Pain has improved since last visit to [facility]. Continues to have reassuring exam. TTP over left ribs indicate rib bruising or fracture 2/2 to fall. Continued normal abdominal exam and stable vitals are reassuring that pain is not splenic in nature.  -Continue Percocet prn  -Return for worsening left side pain   Muscle strain Due to physical exam and history provided by patient suspect breast pain and shoulder/back pain likely due to muscular strain of pectoralis and trapezius muscles. Could possibly be due to rotator cuff tendonopathy or tear in light of discomfort with right UE abduction but patient still has full ROM and empty can test was negative, making these less likely causes of her pain.  Reassured by breast exam and stable vital signs that this is highly unlikely to be cellulitis or mastitis of the breast.  -Rx for Flexeril given  -Instructed patient to use heat if tolerated  -Encouraged continued movement of shoulder and right UE  -Return in 2 weeks if pain not improved to evaluate further for possible rotator cuff etiology      Phill Myron, D.O. 01/22/2015, 12:17 PM PGY-1, [place]

## 2015-01-22 NOTE — Assessment & Plan Note (Signed)
Due to physical exam and history provided by patient suspect breast pain and shoulder/back pain likely due to muscular strain of pectoralis and trapezius muscles. Could possibly be due to rotator cuff tendonopathy or tear in light of discomfort with right UE abduction but patient still has full ROM and empty can test was negative, making these less likely causes of her pain. Reassured by breast exam and stable vital signs that this is highly unlikely to be cellulitis or mastitis of the breast.  -Rx for Flexeril given  -Instructed patient to use heat if tolerated  -Encouraged continued movement of shoulder and right UE  -Return in 2 weeks if pain not improved to evaluate further for possible rotator cuff etiology

## 2015-02-18 ENCOUNTER — Ambulatory Visit (HOSPITAL_BASED_OUTPATIENT_CLINIC_OR_DEPARTMENT_OTHER): Payer: Medicare HMO | Admitting: Physical Medicine & Rehabilitation

## 2015-02-18 ENCOUNTER — Encounter: Payer: Medicare HMO | Attending: Physical Medicine & Rehabilitation

## 2015-02-18 ENCOUNTER — Encounter: Payer: Self-pay | Admitting: Physical Medicine & Rehabilitation

## 2015-02-18 ENCOUNTER — Other Ambulatory Visit: Payer: Self-pay | Admitting: Internal Medicine

## 2015-02-18 VITALS — BP 129/71 | HR 78 | Resp 14

## 2015-02-18 DIAGNOSIS — M47817 Spondylosis without myelopathy or radiculopathy, lumbosacral region: Secondary | ICD-10-CM | POA: Insufficient documentation

## 2015-02-18 DIAGNOSIS — F419 Anxiety disorder, unspecified: Secondary | ICD-10-CM | POA: Diagnosis not present

## 2015-02-18 DIAGNOSIS — B20 Human immunodeficiency virus [HIV] disease: Secondary | ICD-10-CM | POA: Insufficient documentation

## 2015-02-18 DIAGNOSIS — G8929 Other chronic pain: Secondary | ICD-10-CM | POA: Insufficient documentation

## 2015-02-18 DIAGNOSIS — M545 Low back pain, unspecified: Secondary | ICD-10-CM

## 2015-02-18 DIAGNOSIS — M810 Age-related osteoporosis without current pathological fracture: Secondary | ICD-10-CM | POA: Diagnosis not present

## 2015-02-18 DIAGNOSIS — R69 Illness, unspecified: Secondary | ICD-10-CM | POA: Diagnosis not present

## 2015-02-18 DIAGNOSIS — F17210 Nicotine dependence, cigarettes, uncomplicated: Secondary | ICD-10-CM | POA: Insufficient documentation

## 2015-02-18 MED ORDER — TRAMADOL HCL 50 MG PO TABS: 100.0000 mg | ORAL_TABLET | Freq: Three times a day (TID) | ORAL | Status: DC | PRN

## 2015-02-18 NOTE — Patient Instructions (Signed)
No need for repeat injections at the current time.

## 2015-02-18 NOTE — Progress Notes (Signed)
Subjective:    Patient ID: Courtney Hamilton, female    DOB: October 25, 1960, 54 y.o.   MRN: [id number]  HPI Patient had a fall one month ago down the steps injuring her left side of ribs. Overall doing better with this. Still has some soreness in that area. Last spine injection L3 L4 L5 in October 2015. Her back pain has not worsened recently. Has started using tumeric, honey,  Coconut milk  Overall doing well independent with all self-care and mobility and helping to care for her grandchildren Pain Inventory Average Pain 8 Pain Right Now 5 My pain is constant and sharp  In the last 24 hours, has pain interfered with the following? General activity 2 Relation with others 2 Enjoyment of life 2 What TIME of day is your pain at its worst? daytime Sleep (in general) Fair  Pain is worse with: sitting and standing Pain improves with: heat/ice and TENS Relief from Meds: 10  Mobility walk without assistance how many minutes can you walk? 10 ability to climb steps?  yes do you drive?  yes Do you have any goals in this area?  no  Function not employed: date last employed .  Neuro/Psych anxiety  Prior Studies Any changes since last visit?  no  Physicians involved in your care Any changes since last visit?  no   Family History  Problem Relation Age of Onset  . Asthma Father   . Diabetes Brother   . Hypertension Brother    Social History   Social History  . Marital Status: Single    Spouse Name: N/A  . Number of Children: N/A  . Years of Education: N/A   Social History Main Topics  . Smoking status: Current Every Day Smoker -- 0.50 packs/day for 30 years    Types: Cigarettes  . Smokeless tobacco: Never Used     Comment: is trying to quit at this time  . Alcohol Use: No  . Drug Use: No     Comment: past history of cocaine  and alcohol    . Sexual Activity: No     Comment: declined condoms    Other Topics Concern  . None   Social History Narrative   Past  Surgical History  Procedure Laterality Date  . Cholecystectomy    . Appendectomy    . Ectopic pregnancy surgery    . Salivary gland surgery     Past Medical History  Diagnosis Date  . HIV (human immunodeficiency virus infection) ([place])   . Sciatica   . Ruptured lumbar disc   . Osteoporosis   . 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 TB (pulmonary tuberculosis) 1993     exposure, treated   . Herniated disc 10/07/2011   BP 129/71 mmHg  Pulse 78  Resp 14  SpO2 99%  Opioid Risk Score:   Fall Risk Score:  `1  Depression screen PHQ 2/9  Depression screen [facility] 2/9 01/22/2015 01/03/2015 12/19/2014 10/10/2014 10/08/2014 08/20/2014 08/08/2014  Decreased Interest 0 0 0 0 0 0 0  Down, Depressed, Hopeless 0 0 0 0 0 0 0  PHQ - 2 Score 0 0 0 0 0 0 0  Altered sleeping - - - - - 1 -  Tired, decreased energy - - - - - 1 -  Change in appetite - - - - - 0 -  Feeling bad or failure about yourself  - - - - - 0 -  Trouble concentrating - - - - - 0 -  Moving slowly or fidgety/restless - - - - - 0 -  Suicidal thoughts - - - - - 0 -  PHQ-9 Score - - - - - 2 -     Review of Systems  Constitutional: Positive for diaphoresis and appetite change.  Psychiatric/Behavioral: The patient is nervous/anxious.   All other systems reviewed and are negative.      Objective:   Physical Exam  Constitutional: She is oriented to person, place, and time. She appears well-developed and well-nourished.  HENT:  Head: Normocephalic and atraumatic.  Eyes: Conjunctivae and EOM are normal. Pupils are equal, round, and reactive to light.  Neurological: She is alert and oriented to person, place, and time.  Psychiatric: She has a normal mood and affect.  Nursing note and vitals reviewed.   Tenderness over the left lower posterior ribs. No tenderness on the right side. Mild tenderness over the gluteus medius muscle bilaterally. No tenderness over the lumbar paraspinals Patient has good forward  flexion extension lateral bending and twisting.      Assessment & Plan:  1. Lumbar spondylosis with good relief following medial branch blocks this can be repeated however the patient does not wish scheduled current time.She has still some residual effect of this injection Continue tramadol 100 mg 3 times a day no signs of abuse no side effects thus far  2. Right lateral epicondylitis Voltaren gel, counterforce bracing recommended, Exercises given no exercise for 1 week until inflammatory state is under better control 3.  HIV virus undectable, F/u ID  Dr Baxter Flattery Return to clinic 6 months, NP

## 2015-03-26 ENCOUNTER — Emergency Department (HOSPITAL_COMMUNITY)
Admission: EM | Admit: 2015-03-26 | Discharge: 2015-03-27 | Disposition: A | Discharge status: Home / Self Care | Payer: Medicare HMO | Attending: Emergency Medicine | Admitting: Emergency Medicine

## 2015-03-26 ENCOUNTER — Encounter (HOSPITAL_COMMUNITY): Payer: Self-pay | Admitting: Emergency Medicine

## 2015-03-26 ENCOUNTER — Emergency Department (HOSPITAL_COMMUNITY): Payer: Medicare HMO

## 2015-03-26 DIAGNOSIS — Y998 Other external cause status: Secondary | ICD-10-CM | POA: Insufficient documentation

## 2015-03-26 DIAGNOSIS — W108XXA Fall (on) (from) other stairs and steps, initial encounter: Secondary | ICD-10-CM | POA: Insufficient documentation

## 2015-03-26 DIAGNOSIS — Y9289 Other specified places as the place of occurrence of the external cause: Secondary | ICD-10-CM | POA: Insufficient documentation

## 2015-03-26 DIAGNOSIS — S52614A Nondisplaced fracture of right ulna styloid process, initial encounter for closed fracture: Secondary | ICD-10-CM | POA: Diagnosis not present

## 2015-03-26 DIAGNOSIS — S6991XA Unspecified injury of right wrist, hand and finger(s), initial encounter: Secondary | ICD-10-CM | POA: Diagnosis present

## 2015-03-26 DIAGNOSIS — F17210 Nicotine dependence, cigarettes, uncomplicated: Secondary | ICD-10-CM | POA: Insufficient documentation

## 2015-03-26 DIAGNOSIS — Z8739 Personal history of other diseases of the musculoskeletal system and connective tissue: Secondary | ICD-10-CM | POA: Diagnosis not present

## 2015-03-26 DIAGNOSIS — Y9301 Activity, walking, marching and hiking: Secondary | ICD-10-CM | POA: Diagnosis not present

## 2015-03-26 DIAGNOSIS — S59201A Unspecified physeal fracture of lower end of radius, right arm, initial encounter for closed fracture: Secondary | ICD-10-CM | POA: Diagnosis not present

## 2015-03-26 DIAGNOSIS — S52591A Other fractures of lower end of right radius, initial encounter for closed fracture: Secondary | ICD-10-CM | POA: Insufficient documentation

## 2015-03-26 DIAGNOSIS — B20 Human immunodeficiency virus [HIV] disease: Secondary | ICD-10-CM | POA: Insufficient documentation

## 2015-03-26 DIAGNOSIS — Z79899 Other long term (current) drug therapy: Secondary | ICD-10-CM | POA: Diagnosis not present

## 2015-03-26 DIAGNOSIS — Z8611 Personal history of tuberculosis: Secondary | ICD-10-CM | POA: Diagnosis not present

## 2015-03-26 DIAGNOSIS — R69 Illness, unspecified: Secondary | ICD-10-CM | POA: Diagnosis not present

## 2015-03-26 DIAGNOSIS — S62101A Fracture of unspecified carpal bone, right wrist, initial encounter for closed fracture: Secondary | ICD-10-CM

## 2015-03-26 MED ORDER — OXYCODONE-ACETAMINOPHEN 5-325 MG PO TABS
1.0000 | ORAL_TABLET | Freq: Once | ORAL | Status: AC
  Administered 2015-03-27: 1 via ORAL
  Filled 2015-03-26: qty 1

## 2015-03-26 MED ORDER — OXYCODONE-ACETAMINOPHEN 5-325 MG PO TABS: 1.0000 | ORAL_TABLET | ORAL | Status: DC | PRN

## 2015-03-26 MED ORDER — MORPHINE SULFATE (PF) 4 MG/ML IV SOLN
4.0000 mg | Freq: Once | INTRAVENOUS | Status: AC
  Administered 2015-03-26: 4 mg via INTRAMUSCULAR
  Filled 2015-03-26: qty 1

## 2015-03-26 NOTE — ED Provider Notes (Signed)
CSN: [id number]     Arrival date & time 03/26/15  1808 History   None    Chief Complaint  Patient presents with  . Fall  . Wrist Injury   HPI   55 year old female presents today with wrist injury. Patient reports that his afternoon she was walking when she tripped on a stair falling into a wall with her right wrist in the flex addition. She reports immediate pain to the proximal wrist with obvious swelling.  Patient denies any loss of sensation to the distal extremity, no open wounds. She is right-handed, no history of fractures previously. But she does have a history of osteoporosis. No medications prior to arrival  Past Medical History  Diagnosis Date  . HIV (human immunodeficiency virus infection) ([place])   . Sciatica   . Ruptured lumbar disc   . Osteoporosis   . 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 TB (pulmonary tuberculosis) 1993     exposure, treated   . Herniated disc 10/07/2011   Past Surgical History  Procedure Laterality Date  . Cholecystectomy    . Appendectomy    . Ectopic pregnancy surgery    . Salivary gland surgery     Family History  Problem Relation Age of Onset  . Asthma Father   . Diabetes Brother   . Hypertension Brother    Social History  Substance Use Topics  . Smoking status: Current Every Day Smoker -- 0.50 packs/day for 30 years    Types: Cigarettes  . Smokeless tobacco: Never Used     Comment: is trying to quit at this time  . Alcohol Use: No   OB History    No data available     Review of Systems  All other systems reviewed and are negative.   Allergies  Baclofen; Bactrim; and Doxycycline  Home Medications   Prior to Admission medications   Medication Sig Start Date End Date Taking? Authorizing Provider  albuterol (PROVENTIL HFA;VENTOLIN HFA) 108 (90 BASE) MCG/ACT inhaler Inhale 2 puffs into the lungs every 6 (six) hours as needed for wheezing or shortness of breath. 12/19/14   Leeanne Rio, MD   ATRIPLA 600-200-300 MG tablet TAKE 1 TABLET BY MOUTH AT BEDTIME. 12/18/14   Carlyle Basques, MD  Biotin 5000 MCG CAPS Take 5,000 mcg by mouth daily.    Historical Provider, MD  Calcium-Magnesium-Zinc [phone] MG TABS Take 1 tablet by mouth daily.    Historical Provider, MD  clotrimazole (MYCELEX) 10 MG troche Take 10 mg by mouth as needed (thrush).  07/01/11   Historical Provider, MD  Cyanocobalamin 1000 MCG SUBL Place 1,000 mcg under the tongue daily.     Historical Provider, MD  cyclobenzaprine (FLEXERIL) 10 MG tablet TAKE 1 TABLET (10 MG TOTAL) BY MOUTH 3 (THREE) TIMES DAILY AS NEEDED FOR MUSCLE SPASMS. 02/18/15   Janora Norlander, DO  diclofenac sodium (VOLTAREN) 1 % GEL Apply 2 g topically 4 (four) times daily. Patient taking differently: Apply 1 application topically 4 (four) times daily as needed (arthritis pain).  02/16/14   Charlett Blake, MD  oxyCODONE-acetaminophen (PERCOCET/ROXICET) 5-325 MG tablet Take 1 tablet by mouth every 4 (four) hours as needed for severe pain. 03/26/15   Okey Regal, PA-C  traMADol (ULTRAM) 50 MG tablet Take 2 tablets (100 mg total) by mouth every 8 (eight) hours as needed. 02/18/15   Charlett Blake, MD  traZODone (DESYREL) 50 MG tablet TAKE 1  TABLET (50 MG TOTAL) BY MOUTH AT BEDTIME. 11/15/14   Janora Norlander, DO  vitamin C (ASCORBIC ACID) 500 MG tablet Take 500 mg by mouth daily.    Historical Provider, MD  vitamin E 400 UNIT capsule Take 400 Units by mouth daily.    Historical Provider, MD   BP 159/96 mmHg  Pulse 59  Temp(Src) 98.5 F (36.9 C) (Oral)  Resp 18  SpO2 100% Physical Exam  Constitutional: She is oriented to person, place, and time. She appears well-developed and well-nourished.  HENT:  Head: Normocephalic and atraumatic.  Eyes: Conjunctivae are normal. Pupils are equal, round, and reactive to light. Right eye exhibits no discharge. Left eye exhibits no discharge. No scleral icterus.  Neck: Normal range of motion. No JVD  present. No tracheal deviation present.  Pulmonary/Chest: Effort normal. No stridor.  Musculoskeletal:  Obvious deformity to the right wrist, tenderness to palpation of the hand, no open wounds. Distal sensation intact, motor function intact but reduced due to pain, Refill less than 3 seconds radial pulse 2+ no tenderness to palpation of the elbow shoulder  Neurological: She is alert and oriented to person, place, and time. Coordination normal.  Psychiatric: She has a normal mood and affect. Her behavior is normal. Judgment and thought content normal.  Nursing note and vitals reviewed.   ED Course  Procedures (including critical care time) Labs Review Labs Reviewed - No data to display  Imaging Review Dg Wrist Complete Right  03/26/2015  CLINICAL DATA:  Pt states she tripped and fell onto carpet tonight, pains rt wrist laterally, sts and some deformity EXAM: RIGHT WRIST - COMPLETE 3+ VIEW COMPARISON:  None. FINDINGS: Comminuted fracture of the distal radial metaphysis. Primary fracture is transverse across the metaphysis. There is mild comminution most evident dorsally. There is some dorsal impaction of the fracture into mild dorsal angulation of the distal radial articular surface of approximately 19 degree eats. There is an associated ulnar styloid fracture. This is nondisplaced. The wrist joints are normally aligned. The there is soft tissue swelling most evident along the radial wrist and distal forearm. IMPRESSION: 1. Mildly comminuted, dorsally impacted fracture of the distal radial metaphysis with dorsal angulation of the articular surface of the approximate 19 degrees. Nondisplaced associated ulnar styloid fracture. No dislocation. Electronically Signed   By: Lajean Manes M.D.   On: 03/26/2015 18:50   Dg Hand Complete Right  03/26/2015  CLINICAL DATA:  55 year old female with fall and right hand pain. EXAM: RIGHT HAND - COMPLETE 3+ VIEW COMPARISON:  Right wrist radiograph dated 03/26/2015  FINDINGS: There is no acute fracture of the hand or carpal bones. There is osteopenia with degenerative changes. Osteoarthritic changes of the first carpometacarpal joint. Partially visualized fractures of the distal radius there is seen on the dedicated wrist study. IMPRESSION: No fracture of the hand or carpal bones. Partially visualized distal radius and ulna are sided fracture. Electronically Signed   By: Anner Crete M.D.   On: 03/26/2015 23:33   I have personally reviewed and evaluated these images and lab results as part of my medical decision-making.   EKG Interpretation None      MDM   Final diagnoses:  Wrist fracture, right, closed, initial encounter    Patient presents with a fracture to her right wrist. She has no signs of compartment syndrome. I consult to Dr. Ernie Hew who personally reviewed the x-ray and instructed to play sugar tong and follow-up in his office tomorrow at noon. Patient  was given pain medication here in the ED and a prescription to go home. She was given strict return precautions, Dr. Elveria Rising information. She assured her follow-up evaluation.    Okey Regal, PA-C 03/27/15 [id number]  Harvel Quale, MD 04/03/15 2131

## 2015-03-26 NOTE — ED Notes (Signed)
Paged ortho tech to apply sugar tongue splint, per PA who spoke to Dr. Caralyn Guile

## 2015-03-26 NOTE — ED Notes (Signed)
Ortho at bedside.

## 2015-03-26 NOTE — ED Notes (Signed)
Pt sts right wrist injury from fall today; deformity noted and swelling; CMS intact

## 2015-03-26 NOTE — ED Notes (Signed)
Spoke to [employer], will come to apply splint

## 2015-03-27 DIAGNOSIS — Z8739 Personal history of other diseases of the musculoskeletal system and connective tissue: Secondary | ICD-10-CM | POA: Diagnosis not present

## 2015-03-27 DIAGNOSIS — S6991XA Unspecified injury of right wrist, hand and finger(s), initial encounter: Secondary | ICD-10-CM | POA: Diagnosis not present

## 2015-03-27 DIAGNOSIS — Z79899 Other long term (current) drug therapy: Secondary | ICD-10-CM | POA: Diagnosis not present

## 2015-03-27 DIAGNOSIS — S52591A Other fractures of lower end of right radius, initial encounter for closed fracture: Secondary | ICD-10-CM | POA: Diagnosis not present

## 2015-03-27 DIAGNOSIS — Z8611 Personal history of tuberculosis: Secondary | ICD-10-CM | POA: Diagnosis not present

## 2015-03-27 DIAGNOSIS — W108XXA Fall (on) (from) other stairs and steps, initial encounter: Secondary | ICD-10-CM | POA: Diagnosis not present

## 2015-03-27 DIAGNOSIS — M25531 Pain in right wrist: Secondary | ICD-10-CM | POA: Diagnosis not present

## 2015-03-27 DIAGNOSIS — S90211A Contusion of right great toe with damage to nail, initial encounter: Secondary | ICD-10-CM | POA: Diagnosis not present

## 2015-03-27 DIAGNOSIS — R69 Illness, unspecified: Secondary | ICD-10-CM | POA: Diagnosis not present

## 2015-03-27 NOTE — ED Notes (Signed)
Patient able to ambulate independently  

## 2015-03-27 NOTE — Discharge Instructions (Signed)
Please contact Dr. Phillip Heal exhaust as tomorrow morning and verify the you will be there at 12 PM tomorrow for evaluation. Please return medially if any new or worsening signs or symptoms present. Cast or Splint Care Casts and splints support injured limbs and keep bones from moving while they heal.  HOME CARE  Keep the cast or splint uncovered during the drying period.  A plaster cast can take 24 to 48 hours to dry.  A fiberglass cast will dry in less than 1 hour.  Do not rest the cast on anything harder than a pillow for 24 hours.  Do not put weight on your injured limb. Do not put pressure on the cast. Wait for your doctor's approval.  Keep the cast or splint dry.  Cover the cast or splint with a plastic bag during baths or wet weather.  If you have a cast over your chest and belly (trunk), take sponge baths until the cast is taken off.  If your cast gets wet, dry it with a towel or blow dryer. Use the cool setting on the blow dryer.  Keep your cast or splint clean. Wash a dirty cast with a damp cloth.  Do not put any objects under your cast or splint.  Do not scratch the skin under the cast with an object. If itching is a problem, use a blow dryer on a cool setting over the itchy area.  Do not trim or cut your cast.  Do not take out the padding from inside your cast.  Exercise your joints near the cast as told by your doctor.  Raise (elevate) your injured limb on 1 or 2 pillows for the first 1 to 3 days. GET HELP IF:  Your cast or splint cracks.  Your cast or splint is too tight or too loose.  You itch badly under the cast.  Your cast gets wet or has a soft spot.  You have a bad smell coming from the cast.  You get an object stuck under the cast.  Your skin around the cast becomes red or sore.  You have new or more pain after the cast is put on. GET HELP RIGHT AWAY IF:  You have fluid leaking through the cast.  You cannot move your fingers or toes.  Your  fingers or toes turn blue or white or are cool, painful, or puffy (swollen).  You have tingling or lose feeling (numbness) around the injured area.  You have bad pain or pressure under the cast.  You have trouble breathing or have shortness of breath.  You have chest pain.   This information is not intended to replace advice given to you by your health care provider. Make sure you discuss any questions you have with your health care provider.   Document Released: 06/25/2010 Document Revised: 10/26/2012 Document Reviewed: 09/01/2012 Elsevier Interactive Patient Education [employer].

## 2015-04-03 DIAGNOSIS — S52501D Unspecified fracture of the lower end of right radius, subsequent encounter for closed fracture with routine healing: Secondary | ICD-10-CM | POA: Diagnosis not present

## 2015-04-03 DIAGNOSIS — M25531 Pain in right wrist: Secondary | ICD-10-CM | POA: Diagnosis not present

## 2015-04-03 DIAGNOSIS — S90211D Contusion of right great toe with damage to nail, subsequent encounter: Secondary | ICD-10-CM | POA: Diagnosis not present

## 2015-04-11 DIAGNOSIS — S90211D Contusion of right great toe with damage to nail, subsequent encounter: Secondary | ICD-10-CM | POA: Diagnosis not present

## 2015-04-11 DIAGNOSIS — M25531 Pain in right wrist: Secondary | ICD-10-CM | POA: Diagnosis not present

## 2015-04-11 DIAGNOSIS — S52501D Unspecified fracture of the lower end of right radius, subsequent encounter for closed fracture with routine healing: Secondary | ICD-10-CM | POA: Diagnosis not present

## 2015-04-14 ENCOUNTER — Other Ambulatory Visit: Payer: Self-pay | Admitting: Internal Medicine

## 2015-04-19 DIAGNOSIS — S90111D Contusion of right great toe without damage to nail, subsequent encounter: Secondary | ICD-10-CM | POA: Diagnosis not present

## 2015-04-19 DIAGNOSIS — S52501D Unspecified fracture of the lower end of right radius, subsequent encounter for closed fracture with routine healing: Secondary | ICD-10-CM | POA: Diagnosis not present

## 2015-05-03 DIAGNOSIS — M25531 Pain in right wrist: Secondary | ICD-10-CM | POA: Diagnosis not present

## 2015-05-16 DIAGNOSIS — S52501D Unspecified fracture of the lower end of right radius, subsequent encounter for closed fracture with routine healing: Secondary | ICD-10-CM | POA: Diagnosis not present

## 2015-05-16 DIAGNOSIS — M25531 Pain in right wrist: Secondary | ICD-10-CM | POA: Diagnosis not present

## 2015-05-28 ENCOUNTER — Encounter: Payer: Self-pay | Admitting: Internal Medicine

## 2015-05-28 ENCOUNTER — Ambulatory Visit (INDEPENDENT_AMBULATORY_CARE_PROVIDER_SITE_OTHER): Payer: Medicare HMO | Admitting: Internal Medicine

## 2015-05-28 VITALS — BP 117/76 | HR 83 | Temp 98.3°F | Wt 171.0 lb

## 2015-05-28 DIAGNOSIS — R69 Illness, unspecified: Secondary | ICD-10-CM | POA: Diagnosis not present

## 2015-05-28 DIAGNOSIS — M47817 Spondylosis without myelopathy or radiculopathy, lumbosacral region: Secondary | ICD-10-CM

## 2015-05-28 DIAGNOSIS — G479 Sleep disorder, unspecified: Secondary | ICD-10-CM

## 2015-05-28 DIAGNOSIS — Z79899 Other long term (current) drug therapy: Secondary | ICD-10-CM | POA: Diagnosis not present

## 2015-05-28 DIAGNOSIS — S62101S Fracture of unspecified carpal bone, right wrist, sequela: Secondary | ICD-10-CM | POA: Diagnosis not present

## 2015-05-28 DIAGNOSIS — B20 Human immunodeficiency virus [HIV] disease: Secondary | ICD-10-CM | POA: Diagnosis not present

## 2015-05-28 LAB — CBC WITH DIFFERENTIAL/PLATELET
BASOS ABS: 0 10*3/uL (ref 0.0–0.1)
BASOS PCT: 0 % (ref 0–1)
EOS ABS: 0.2 10*3/uL (ref 0.0–0.7)
EOS PCT: 3 % (ref 0–5)
HCT: 40.6 % (ref 36.0–46.0)
Hemoglobin: 13.5 g/dL (ref 12.0–15.0)
LYMPHS ABS: 1.9 10*3/uL (ref 0.7–4.0)
Lymphocytes Relative: 26 % (ref 12–46)
MCH: 29.2 pg (ref 26.0–34.0)
MCHC: 33.3 g/dL (ref 30.0–36.0)
MCV: 87.9 fL (ref 78.0–100.0)
MPV: 9.6 fL (ref 8.6–12.4)
Monocytes Absolute: 0.7 10*3/uL (ref 0.1–1.0)
Monocytes Relative: 9 % (ref 3–12)
NEUTROS PCT: 62 % (ref 43–77)
Neutro Abs: 4.6 10*3/uL (ref 1.7–7.7)
PLATELETS: 316 10*3/uL (ref 150–400)
RBC: 4.62 MIL/uL (ref 3.87–5.11)
RDW: 14 % (ref 11.5–15.5)
WBC: 7.4 10*3/uL (ref 4.0–10.5)

## 2015-05-28 LAB — LIPID PANEL
CHOL/HDL RATIO: 5.6 ratio — AB (ref <=5.0)
CHOLESTEROL: 274 mg/dL — AB (ref 125–200)
HDL: 49 mg/dL (ref >=46)
LDL Cholesterol: 191 mg/dL — ABNORMAL HIGH (ref <130)
TRIGLYCERIDES: 169 mg/dL — AB (ref <150)
VLDL: 34 mg/dL — ABNORMAL HIGH (ref <30)

## 2015-05-28 LAB — COMPLETE METABOLIC PANEL WITH GFR
ALBUMIN: 4.3 g/dL (ref 3.6–5.1)
ALK PHOS: 125 U/L (ref 33–130)
ALT: 8 U/L (ref 6–29)
AST: 13 U/L (ref 10–35)
BUN: 10 mg/dL (ref 7–25)
CALCIUM: 9.6 mg/dL (ref 8.6–10.4)
CO2: 26 mmol/L (ref 20–31)
CREATININE: 0.71 mg/dL (ref 0.50–1.05)
Chloride: 106 mmol/L (ref 98–110)
Glucose, Bld: 62 mg/dL — ABNORMAL LOW (ref 65–99)
Potassium: 4.1 mmol/L (ref 3.5–5.3)
Sodium: 141 mmol/L (ref 135–146)
Total Bilirubin: 0.3 mg/dL (ref 0.2–1.2)
Total Protein: 7.6 g/dL (ref 6.1–8.1)

## 2015-05-28 MED ORDER — TRAZODONE HCL 50 MG PO TABS: ORAL_TABLET | ORAL | Status: DC

## 2015-05-28 NOTE — Progress Notes (Signed)
Patient ID: Courtney Hamilton, female   DOB: 1961-02-12, 55 y.o.   MRN: [id number]       Patient ID: Courtney Hamilton, female   DOB: [birth year], 55 y.o.   MRN: [id number]  HPI 55yo F with CD 4 count 340/VL<20, on atripla. Doing well with adherence, has occasional insomnia. She sustained fall/trip in the home and fractured her left wrist. Now in soft brace. Still having discomfort attempting stretching exercises  Outpatient Encounter Prescriptions as of 05/28/2015  Medication Sig  . albuterol (PROVENTIL HFA;VENTOLIN HFA) 108 (90 BASE) MCG/ACT inhaler Inhale 2 puffs into the lungs every 6 (six) hours as needed for wheezing or shortness of breath.  . ATRIPLA 600-200-300 MG tablet TAKE 1 TABLET BY MOUTH AT BEDTIME.  Marland Kitchen Biotin 5000 MCG CAPS Take 5,000 mcg by mouth daily.  . Calcium-Magnesium-Zinc 167-83-8 MG TABS Take 1 tablet by mouth daily.  . Cyanocobalamin 1000 MCG SUBL Place 1,000 mcg under the tongue daily.   . traMADol (ULTRAM) 50 MG tablet Take 2 tablets (100 mg total) by mouth every 8 (eight) hours as needed.  . vitamin C (ASCORBIC ACID) 500 MG tablet Take 500 mg by mouth daily.  . vitamin E 400 UNIT capsule Take 400 Units by mouth daily.  . clotrimazole (MYCELEX) 10 MG troche Take 10 mg by mouth as needed (thrush). Reported on 05/28/2015  . cyclobenzaprine (FLEXERIL) 10 MG tablet TAKE 1 TABLET (10 MG TOTAL) BY MOUTH 3 (THREE) TIMES DAILY AS NEEDED FOR MUSCLE SPASMS. (Patient not taking: Reported on 05/28/2015)  . diclofenac sodium (VOLTAREN) 1 % GEL Apply 2 g topically 4 (four) times daily. (Patient not taking: Reported on 05/28/2015)  . oxyCODONE-acetaminophen (PERCOCET/ROXICET) 5-325 MG tablet Take 1 tablet by mouth every 4 (four) hours as needed for severe pain. (Patient not taking: Reported on 05/28/2015)  . traZODone (DESYREL) 50 MG tablet TAKE 1 TABLET (50 MG TOTAL) BY MOUTH AT BEDTIME. (Patient not taking: Reported on 05/28/2015)   No facility-administered encounter medications on file as of  05/28/2015.     Patient Active Problem List   Diagnosis Date Noted  . Chronic low back pain without sciatica 02/18/2015  . Muscle strain 01/22/2015  . Fall 01/14/2015  . Abdominal pain 10/08/2014  . Generalized anxiety disorder 06/04/2014  . Ready to quit smoking 04/06/2014  . Anxiety state 01/07/2014  . Panic attacks 12/22/2013  . Lumbosacral spondylosis without myelopathy 09/29/2013  . 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 Lumbar paraspinal muscle spasm 01/16/2013  . Lower GI bleed 05/10/2012  . Reflux 12/13/2011  . Herpes simplex 10/07/2011  . Peripheral neuropathy ([place]) 10/07/2011  . Hypercholesteremia 10/07/2011  . History of tuberculosis exposure 10/01/2011  . Recurrent UTI 08/06/2011  . HIV positive ([place]) 08/03/2011  . Sleep disorder 08/03/2011  . Back pain 07/21/2011  . Osteoporosis 03/09/2008     There are no preventive care reminders to display for this patient.   Review of Systems +right wrist pain, low back pain, insomnia. Otherwise 10 point ros is negative Physical Exam   BP 117/76 mmHg  Pulse 83  Temp(Src) 98.3 F (36.8 C) (Oral)  Wt 171 lb (77.565 kg) Physical Exam  Constitutional:  oriented to person, place, and time. appears well-developed and well-nourished. No distress.  HENT: [place]/AT, PERRLA, no scleral icterus Mouth/Throat: Oropharynx is clear and moist. No oropharyngeal exudate.  Cardiovascular: Normal rate, regular rhythm and normal heart sounds. Exam reveals no gallop and no friction rub.  No murmur heard.  Pulmonary/Chest: Effort normal  and breath sounds normal. No respiratory distress.  has no wheezes.  Neck = supple, no nuchal rigidity Abdominal: Soft. Bowel sounds are normal.  exhibits no distension. There is no tenderness.  Lymphadenopathy: no cervical adenopathy. No axillary adenopathy Neurological: alert and oriented to person, place, and time.  Skin: Skin is warm and dry. No rash noted. No erythema.  Psychiatric: a normal  mood and affect.  behavior is normal.    Lab Results  Component Value Date   CD4TCELL 13* 01/03/2015   Lab Results  Component Value Date   CD4TABS 340* 01/03/2015   CD4TABS 270* 04/19/2014   CD4TABS 370* 01/17/2014   Lab Results  Component Value Date   HIV1RNAQUANT <20 01/03/2015   Lab Results  Component Value Date   HEPBSAB NEG 10/01/2011   No results found for: RPR  CBC Lab Results  Component Value Date   WBC 7.8 01/03/2015   RBC 4.56 01/03/2015   HGB 13.2 01/03/2015   HCT 40.3 01/03/2015   PLT 302 01/03/2015   MCV 88.4 01/03/2015   MCH 28.9 01/03/2015   MCHC 32.8 01/03/2015   RDW 14.4 01/03/2015   LYMPHSABS 2.4 01/03/2015   MONOABS 0.7 01/03/2015   EOSABS 0.3 01/03/2015   BASOSABS 0.1 01/03/2015   BMET Lab Results  Component Value Date   NA 139 01/03/2015   K 4.4 01/03/2015   CL 105 01/03/2015   CO2 27 01/03/2015   GLUCOSE 89 01/03/2015   BUN 9 01/03/2015   CREATININE 0.69 01/03/2015   CALCIUM 8.8 01/03/2015   GFRNONAA >89 01/03/2015   GFRAA >89 01/03/2015     Assessment and Plan  hiv disease = will repeat blood work and plan to switch regimen at next visit in early June/late may. She is more open to it  Wrist, right , fracture = following with dr. Amedeo Plenty  Insomnia = will refill trazodone  Back pain = sees pain specialist currently on tramadol

## 2015-05-29 LAB — T-HELPER CELL (CD4) - (RCID CLINIC ONLY)
CD4 % Helper T Cell: 13 % — ABNORMAL LOW (ref 33–55)
CD4 T Cell Abs: 250 /uL — ABNORMAL LOW (ref 400–2700)

## 2015-05-30 LAB — HIV-1 RNA QUANT-NO REFLEX-BLD
HIV 1 RNA Quant: <20 copies/mL (ref <20)
HIV-1 RNA Quant, Log: <1.3 Log copies/mL (ref <1.30)

## 2015-06-06 DIAGNOSIS — S52501D Unspecified fracture of the lower end of right radius, subsequent encounter for closed fracture with routine healing: Secondary | ICD-10-CM | POA: Diagnosis not present

## 2015-07-01 ENCOUNTER — Encounter: Payer: Medicare HMO | Attending: Physical Medicine & Rehabilitation

## 2015-07-01 ENCOUNTER — Encounter: Payer: Self-pay | Admitting: Physical Medicine & Rehabilitation

## 2015-07-01 ENCOUNTER — Ambulatory Visit (HOSPITAL_BASED_OUTPATIENT_CLINIC_OR_DEPARTMENT_OTHER): Payer: Medicare HMO | Admitting: Physical Medicine & Rehabilitation

## 2015-07-01 VITALS — BP 126/82 | HR 64 | Resp 14

## 2015-07-01 DIAGNOSIS — G8929 Other chronic pain: Secondary | ICD-10-CM

## 2015-07-01 DIAGNOSIS — F17210 Nicotine dependence, cigarettes, uncomplicated: Secondary | ICD-10-CM | POA: Insufficient documentation

## 2015-07-01 DIAGNOSIS — R69 Illness, unspecified: Secondary | ICD-10-CM | POA: Diagnosis not present

## 2015-07-01 DIAGNOSIS — M5186 Other intervertebral disc disorders, lumbar region: Secondary | ICD-10-CM | POA: Insufficient documentation

## 2015-07-01 DIAGNOSIS — M5430 Sciatica, unspecified side: Secondary | ICD-10-CM | POA: Diagnosis present

## 2015-07-01 DIAGNOSIS — B20 Human immunodeficiency virus [HIV] disease: Secondary | ICD-10-CM | POA: Insufficient documentation

## 2015-07-01 DIAGNOSIS — M47817 Spondylosis without myelopathy or radiculopathy, lumbosacral region: Secondary | ICD-10-CM

## 2015-07-01 DIAGNOSIS — Z201 Contact with and (suspected) exposure to tuberculosis: Secondary | ICD-10-CM | POA: Insufficient documentation

## 2015-07-01 DIAGNOSIS — M545 Low back pain: Secondary | ICD-10-CM | POA: Diagnosis not present

## 2015-07-01 NOTE — Patient Instructions (Signed)
If you become sick or develop a fever please cancel reschedule appointment.

## 2015-07-01 NOTE — Progress Notes (Signed)
Subjective:    Patient ID: Courtney Hamilton, female    DOB: 02/25/1961, 55 y.o.   MRN: [id number]  HPI 55 year old female with chronic low back pain. She has had good relief with lumbar medial branch blocks at L3 L4 L5 and the last injection was performed in October 2015. She has been well managed with tramadol, 100 mg 3 times a day. More recently she's had increasing left-sided low back pain. No recent trauma. She has no pain that radiates into the left lower limb. She has no numbness or tingling in her left lower limb. Patient has had no new medical issues.History of HIV. She followed up with her infectious disease specialist last month. No medication changes. CD4 count was 250, viral load undetectable Pain Inventory Average Pain 8 Pain Right Now 8 My pain is intermittent, sharp and burning  In the last 24 hours, has pain interfered with the following? General activity 5 Relation with others 5 Enjoyment of life 5 What TIME of day is your pain at its worst? daytime, evening  Sleep (in general) Poor  Pain is worse with: standing and some activites Pain improves with: rest, heat/ice and medication Relief from Meds: 6  Mobility walk without assistance how many minutes can you walk? 10 ability to climb steps?  yes do you drive?  yes Do you have any goals in this area?  no  Function disabled: date disabled 2007 Do you have any goals in this area?  no  Neuro/Psych No problems in this area  Prior Studies Any changes since last visit?  yes x-rays CT ABDOMEN AND PELVIS WITH CONTRAST 05/25/2012 Technique: Multidetector CT imaging of the abdomen and pelvis was  performed following the standard protocol during bolus  administration of intravenous contrast.  Contrast: 135mL OMNIPAQUE IOHEXOL 300 MG/ML SOLN  Comparison: None.  Findings: The lung bases are clear. The liver enhances with no  focal abnormality and no ductal dilatation is seen. Surgical clips  are present from  prior cholecystectomy. The pancreas is normal in  size and the pancreatic duct is not dilated. The adrenal glands  and spleen are unremarkable. The stomach is not well distended.  The kidneys enhance with no calculus or mass and no hydronephrosis  is seen. On delayed images, the pelvocaliceal systems are  unremarkable in the proximal ureters are normal in caliber. The  abdominal aorta is normal in caliber. No adenopathy is seen.  The uterus is to the left of midline with low attenuation within  the endometrial cavity. Clinical correlation is recommended. No  adnexal lesion is seen and no flow is noted within the pelvis. The  urinary bladder is not well distended. There is a moderate amount  of feces throughout the colon. No evidence of diverticulitis is  seen. The terminal ileum is unremarkable. The appendix has  previously been resected. No abdominal wall hernia is noted. The  lumbar vertebrae are in normal alignment. There is some  degenerative change involving the facet joints of L4-5 and L5-S1.  IMPRESSION:  1. No evidence of diverticulitis.  2. The uterus is to the left of midline with low attenuation  within the endometrial cavity. Correlate clinically.  3. Degenerative change involves the facet joints of L4-5 and L5-  S1.  Physicians involved in your care Any changes since last visit?  no   Family History  Problem Relation Age of Onset  . Asthma Father   . Diabetes Brother   . Hypertension Brother    Social History  Social History  . Marital Status: Single    Spouse Name: N/A  . Number of Children: N/A  . Years of Education: N/A   Social History Main Topics  . Smoking status: Current Every Day Smoker -- 0.50 packs/day for 30 years    Types: Cigarettes  . Smokeless tobacco: Never Used     Comment: is trying to quit at this time  . Alcohol Use: No  . Drug Use: No     Comment: past history of cocaine  and alcohol    . Sexual Activity: No      Comment: declined condoms    Other Topics Concern  . None   Social History Narrative   Past Surgical History  Procedure Laterality Date  . Cholecystectomy    . Appendectomy    . Ectopic pregnancy surgery    . Salivary gland surgery     Past Medical History  Diagnosis Date  . HIV (human immunodeficiency virus infection) ([place])   . Sciatica   . Ruptured lumbar disc   . Osteoporosis   . 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 TB (pulmonary tuberculosis) 1993     exposure, treated   . Herniated disc 10/07/2011   BP 126/82 mmHg  Pulse 64  Resp 14  SpO2 97%  Opioid Risk Score:   Fall Risk Score:  `1  Depression screen PHQ 2/9  Depression screen [facility] 2/9 01/22/2015 01/03/2015 12/19/2014 10/10/2014 10/08/2014 08/20/2014 08/08/2014  Decreased Interest 0 0 0 0 0 0 0  Down, Depressed, Hopeless 0 0 0 0 0 0 0  PHQ - 2 Score 0 0 0 0 0 0 0  Altered sleeping - - - - - 1 -  Tired, decreased energy - - - - - 1 -  Change in appetite - - - - - 0 -  Feeling bad or failure about yourself  - - - - - 0 -  Trouble concentrating - - - - - 0 -  Moving slowly or fidgety/restless - - - - - 0 -  Suicidal thoughts - - - - - 0 -  PHQ-9 Score - - - - - 2 -     Review of Systems  All other systems reviewed and are negative.      Objective:   Physical Exam  Constitutional: She is oriented to person, place, and time. She appears well-developed and well-nourished.  HENT:  Head: Normocephalic and atraumatic.  Eyes: Conjunctivae are normal. Pupils are equal, round, and reactive to light.  Neck: Normal range of motion.  Neurological: She is alert and oriented to person, place, and time. She has normal strength. She displays no atrophy. No sensory deficit. Gait normal.  Reflex Scores:      Patellar reflexes are 2+ on the right side and 2+ on the left side.      Achilles reflexes are 2+ on the right side and 2+ on the left side. Motor strength is 5/5 bilateral hip flexor knee  extensor ankle dorsiflexor Negative straight leg raising  Psychiatric: She has a normal mood and affect.  Nursing note and vitals reviewed.         Assessment & Plan:  1. Lumbar spondylosis without evidence of myelopathy. Her pain complaints are mainly left-sided. She has responded well in the past to bilateral L3 L4 L5 lumbar medial branch blocks. Her last injection was approximately 18 months ago. CD4 count is a little lower  than it was 6 months ago, we will check with her infectious disease specialist to make sure a spinal injection is advisable. This will not enter the epidural space.  Would inject left L3 L4 Medial branch and L5 Dorsal ramus Discussed with patient agrees with plan

## 2015-07-16 ENCOUNTER — Encounter: Payer: Medicare HMO | Attending: Physical Medicine & Rehabilitation

## 2015-07-16 ENCOUNTER — Ambulatory Visit (HOSPITAL_BASED_OUTPATIENT_CLINIC_OR_DEPARTMENT_OTHER): Payer: Medicare HMO | Admitting: Physical Medicine & Rehabilitation

## 2015-07-16 ENCOUNTER — Encounter: Payer: Self-pay | Admitting: Physical Medicine & Rehabilitation

## 2015-07-16 VITALS — BP 127/69 | HR 61 | Resp 14

## 2015-07-16 DIAGNOSIS — M47817 Spondylosis without myelopathy or radiculopathy, lumbosacral region: Secondary | ICD-10-CM

## 2015-07-16 DIAGNOSIS — M47816 Spondylosis without myelopathy or radiculopathy, lumbar region: Secondary | ICD-10-CM

## 2015-07-16 DIAGNOSIS — Z201 Contact with and (suspected) exposure to tuberculosis: Secondary | ICD-10-CM | POA: Insufficient documentation

## 2015-07-16 DIAGNOSIS — B20 Human immunodeficiency virus [HIV] disease: Secondary | ICD-10-CM | POA: Diagnosis not present

## 2015-07-16 DIAGNOSIS — M5186 Other intervertebral disc disorders, lumbar region: Secondary | ICD-10-CM | POA: Insufficient documentation

## 2015-07-16 DIAGNOSIS — F17210 Nicotine dependence, cigarettes, uncomplicated: Secondary | ICD-10-CM | POA: Diagnosis not present

## 2015-07-16 DIAGNOSIS — G8929 Other chronic pain: Secondary | ICD-10-CM | POA: Diagnosis not present

## 2015-07-16 DIAGNOSIS — R69 Illness, unspecified: Secondary | ICD-10-CM | POA: Diagnosis not present

## 2015-07-16 DIAGNOSIS — M5430 Sciatica, unspecified side: Secondary | ICD-10-CM | POA: Diagnosis present

## 2015-07-16 NOTE — Progress Notes (Signed)
Left Lumbar L3, L4  medial branch blocks and L 5 dorsal ramus injection under fluoroscopic guidance   Indication: Left Lumbar pain which is not relieved by medication management or other conservative care and interfering with self-care and mobility. History of HIV, received okay to perform injection from Dr. Baxter Flattery, patient's infectious disease specialist.Last CD4 count 250  Informed consent was obtained after describing risks and benefits of the procedure with the patient, this includes bleeding, bruising, infection, paralysis and medication side effects.  The patient wishes to proceed and has given written consent.  The patient was placed in a prone position.  The lumbar area was marked and prepped with Betadine.  One mL of 1% lidocaine was injected into each of 3 areas into the skin and subcutaneous tissue.  Then a 22-gauge 3.5 inch spinal needle was inserted targeting the junction of the left S1 superior articular process and sacral ala junction.  Needle was advanced under fluoroscopic guidance.  Bone contact was made.  Omnipaque 180 was injected x 0.5 mL demonstrating no intravascular uptake. .25% Marcaine was injected x 0.5 mL.  Then the left L5 superior articular process in transverse process junction was targeted.  Bone contact was made.  Omnipaque 180 was injected x 0.5 mL demonstrating no intravascular uptake.  Then a solution containing .25% marcaine was injected x 0.5 mL.  Then the left L4 superior articular process in transverse process junction was targeted.  Bone contact was made.  Omnipaque 180 was injected x 0.5 mL demonstrating no intravascular uptake.  Then .25% marcaine x 0.5 mL.  Patient tolerated procedure well.  Post procedure instructions were given.  Patient states that some of her pain goes down below the knee. We will follow-up in 2-3 weeks to see the effect of this injection, may need epidural injection, If current injection not beneficial. Would need repeat imaging studies.

## 2015-07-16 NOTE — Patient Instructions (Signed)

## 2015-07-16 NOTE — Progress Notes (Signed)
PROCEDURE RECORD [facility]   Name: Courtney Hamilton DOB:1960/12/16 MRN: [id number]  Date:07/16/2015  Physician: Claudette Laws, MD    Nurse/CMA: Purvis Sheffield, CMA  Allergies:  Allergies  Allergen Reactions  . Baclofen Other (See Comments)    Perioral paresthesia  . Bactrim [Sulfamethoxazole-Trimethoprim] Rash  . Doxycycline Rash    Consent Signed: Yes.    Is patient diabetic? No.  CBG today? N/A  Pregnant: No. LMP: No LMP recorded. Patient is postmenopausal. (age 72-55)  Anticoagulants: no Anti-inflammatory: no Antibiotics: no  Procedure: medial branch block non-steroid  Position: Prone Start Time: 11:17am  End Time: 11:23am  Fluoro Time: 26  RN/CMA Bane Hagy, CMA Kaelyn Nauta, CMA    Time 11:05am 11:26am    BP 127/69 139/69    Pulse 61 64    Respirations 14 14    O2 Sat 99 100    S/S 6 6    Pain Level 10/10 7/10     D/C home with Dad patient A & O X 3, D/C instructions reviewed, and sits independently.

## 2015-07-30 ENCOUNTER — Ambulatory Visit: Payer: Medicare HMO | Admitting: Family Medicine

## 2015-07-31 ENCOUNTER — Encounter: Payer: Self-pay | Admitting: Family Medicine

## 2015-07-31 ENCOUNTER — Ambulatory Visit (INDEPENDENT_AMBULATORY_CARE_PROVIDER_SITE_OTHER): Payer: Medicare HMO | Admitting: Family Medicine

## 2015-07-31 VITALS — BP 100/60 | HR 76 | Temp 98.6°F | Ht 66.0 in | Wt 171.0 lb

## 2015-07-31 DIAGNOSIS — J029 Acute pharyngitis, unspecified: Secondary | ICD-10-CM

## 2015-07-31 LAB — POCT RAPID STREP A (OFFICE): Rapid Strep A Screen: NEGATIVE

## 2015-07-31 NOTE — Patient Instructions (Signed)
Your symptoms are due to a viral illness. Antibiotics will not help improve your symptoms, but the following will help you feel better while your body fights the virus.   Drink lots of water (Guaifenesin "Mucinex" can help with mucus in throat)  Pain/Sore throat: Tylenol, Ibuprofen as needed  Wash your hands often to prevent spreading the virus  Call or return to clinic if you develops fevers greater than 102, difficulty breathing, chest pain or excessive drooling or difficulty/pain with swallowing

## 2015-07-31 NOTE — Progress Notes (Signed)
   Subjective:    Patient ID: Courtney Hamilton, female    DOB: 09/04/1960, 55 y.o.   MRN: [id number]  Seen for Same day visit for   CC: sore throat  SORE THROAT  Sore throat began 2 days ago. Pain is: burning Severity: moderate Medications tried: ibuprofen Strep throat exposure: no STD exposure: denies  Symptoms Fever: no Cough: no Runny nose: no Muscle aches: no Swollen Glands: no Trouble breathing: no Drooling: no Weight loss: no Denies trismus, drooling, neck pain with movement  Patient believes could be caused by: Recent sick contact with grandkids who all had runny noses, cough and sore throat  - Follow-up with infectious disease for HIV.  Reports compliance with HIV medications.   Review of Symptoms - see HPI PMH - Smoking status noted.    Objective:  BP 100/60 mmHg  Pulse 76  Temp(Src) 98.6 F (37 C) (Oral)  Ht 5\' 6"  (1.676 m)  Wt 171 lb (77.565 kg)  BMI 27.61 kg/m2  SpO2 95%  General: NAD HEENT: Mild pharyngeal erythema.  No tonsillar exudates; no cervical adenopathy;  Neck: Full range of motion without pain; no neck swelling Cardiac: RRR, normal heart sounds, no murmurs. 2+ radial and PT pulses bilaterally Respiratory: CTAB, normal effort Skin: warm and dry, no rashes noted    Assessment & Plan:   1. Sore throat Patient with HIV that is well controlled.  Last CD4 count 250, and viral load undetectable.  No drooling/fever/neck swelling/difficulty swallowing or breathing.  Sore throat, most likely due to viral infection with recent sick contacts.  - Rapid Strep A - See AVS for symptomatically management and return precautions

## 2015-08-06 ENCOUNTER — Ambulatory Visit: Payer: Medicare HMO | Admitting: Physical Medicine & Rehabilitation

## 2015-08-06 ENCOUNTER — Ambulatory Visit (INDEPENDENT_AMBULATORY_CARE_PROVIDER_SITE_OTHER): Payer: Medicare HMO | Admitting: Family Medicine

## 2015-08-06 ENCOUNTER — Encounter: Payer: Self-pay | Admitting: Family Medicine

## 2015-08-06 VITALS — BP 109/68 | HR 72 | Temp 98.5°F | Ht 66.0 in | Wt 169.0 lb

## 2015-08-06 DIAGNOSIS — J069 Acute upper respiratory infection, unspecified: Secondary | ICD-10-CM | POA: Diagnosis not present

## 2015-08-06 MED ORDER — AMOXICILLIN 875 MG PO TABS: 875.0000 mg | ORAL_TABLET | Freq: Two times a day (BID) | ORAL | Status: DC

## 2015-08-06 NOTE — Patient Instructions (Signed)
Thank you for coming to see me today. It was a pleasure. Today we talked about:   Respiratory Infection: this is likely viral, however, if no improvement in the next 3 days, please take the antibiotic prescription to the pharmacy to be filled. If worsening, please follow-up in the clinic.  Please make an appointment to see Dr. Lajuana Ripple for follow-up.  If you have any questions or concerns, please do not hesitate to call the office at [phone].  Sincerely,  Cordelia Poche, MD

## 2015-08-06 NOTE — Progress Notes (Signed)
    Subjective   Courtney Hamilton is a 55 y.o. female that presents for a same day visit  1. Coughing: Patient returns for reevaluation for sore throat. Symptoms started one week ago. She reports that her throat is still hurting. She has associated sneezing and productive cough. No nausea, vomiting, dyspnea or chest pain. She reports one episode of watery stools yesterday. Sick contacts include her grandchildren.  ROS Per HPI  Social History  Substance Use Topics  . Smoking status: Current Every Day Smoker -- 0.50 packs/day for 30 years    Types: Cigarettes  . Smokeless tobacco: Never Used     Comment: is trying to quit at this time  . Alcohol Use: No    Allergies  Allergen Reactions  . Baclofen Other (See Comments)    Perioral paresthesia  . Bactrim [Sulfamethoxazole-Trimethoprim] Rash  . Doxycycline Rash    Objective   BP 109/68 mmHg  Pulse 72  Temp(Src) 98.5 F (36.9 C) (Oral)  Ht 5\' 6"  (1.676 m)  Wt 169 lb (76.658 kg)  BMI 27.29 kg/m2  General: Fair appearing, no distress HEENT:   Head:  Normocephalic  Eyes: Pupils equal and reactive to light/accomodation. Extraocular movements intact bilaterally.  Ears: Tympanic membranes normal bilaterally.  Nose/Throat: Nares patent bilaterally. Oropharnx clear and moist. No erythema. No trismus  Neck: No cervical adenopathy bilaterally Respiratory/Chest: Clear to auscultation bilaterally. Unlabored work of breathing. No wheezing or rales.  Assessment and Plan   Meds ordered this encounter  Medications  . amoxicillin (AMOXIL) 875 MG tablet    Sig: Take 1 tablet (875 mg total) by mouth 2 (two) times daily.    Dispense:  20 tablet    Refill:  0    1. Acute upper respiratory infection Patient likely has a viral infection. Since symptoms have been going on from swollen week, will give a prescription for amoxicillin. Instructed patient that if symptoms do not improve next 3 days, she can take the prescription to the pharmacy  to be filled. If symptoms worsen however, advised patient to follow-up with our clinic. Patient agrees plan. - amoxicillin (AMOXIL) 875 MG tablet; Take 1 tablet (875 mg total) by mouth 2 (two) times daily.  Dispense: 20 tablet; Refill: 0

## 2015-08-12 ENCOUNTER — Ambulatory Visit (HOSPITAL_BASED_OUTPATIENT_CLINIC_OR_DEPARTMENT_OTHER): Payer: Medicare HMO | Admitting: Physical Medicine & Rehabilitation

## 2015-08-12 ENCOUNTER — Encounter: Payer: Self-pay | Admitting: Physical Medicine & Rehabilitation

## 2015-08-12 ENCOUNTER — Ambulatory Visit: Payer: Medicare HMO | Admitting: Physical Medicine & Rehabilitation

## 2015-08-12 ENCOUNTER — Encounter: Payer: Medicare HMO | Attending: Physical Medicine & Rehabilitation

## 2015-08-12 VITALS — BP 106/69 | HR 56 | Resp 14

## 2015-08-12 DIAGNOSIS — R69 Illness, unspecified: Secondary | ICD-10-CM | POA: Diagnosis not present

## 2015-08-12 DIAGNOSIS — G8929 Other chronic pain: Secondary | ICD-10-CM | POA: Insufficient documentation

## 2015-08-12 DIAGNOSIS — B20 Human immunodeficiency virus [HIV] disease: Secondary | ICD-10-CM | POA: Insufficient documentation

## 2015-08-12 DIAGNOSIS — M5430 Sciatica, unspecified side: Secondary | ICD-10-CM | POA: Diagnosis present

## 2015-08-12 DIAGNOSIS — F17210 Nicotine dependence, cigarettes, uncomplicated: Secondary | ICD-10-CM | POA: Diagnosis not present

## 2015-08-12 DIAGNOSIS — M5186 Other intervertebral disc disorders, lumbar region: Secondary | ICD-10-CM | POA: Diagnosis not present

## 2015-08-12 DIAGNOSIS — M545 Low back pain, unspecified: Secondary | ICD-10-CM

## 2015-08-12 DIAGNOSIS — Z201 Contact with and (suspected) exposure to tuberculosis: Secondary | ICD-10-CM | POA: Insufficient documentation

## 2015-08-12 DIAGNOSIS — M47817 Spondylosis without myelopathy or radiculopathy, lumbosacral region: Secondary | ICD-10-CM | POA: Diagnosis not present

## 2015-08-12 MED ORDER — TRAMADOL HCL 50 MG PO TABS: 100.0000 mg | ORAL_TABLET | Freq: Three times a day (TID) | ORAL | Status: DC | PRN

## 2015-08-12 MED ORDER — CYCLOBENZAPRINE HCL 10 MG PO TABS: 10.0000 mg | ORAL_TABLET | Freq: Three times a day (TID) | ORAL | Status: DC | PRN

## 2015-08-12 NOTE — Progress Notes (Signed)
Subjective:    Patient ID: Courtney Hamilton, female    DOB: [birth year], 55 y.o.   MRN: [id number]  HPI Left lumbar L3,4,5 MBB no steroid, Greater than 50% relief for about 2 weeks. Over the weekend she did some gardening and while she was in the seated position she did bend over. She's had pain now with sitting for the last 2-3 days which she grades as severe. She has no pain radiating below the knee she does have some pain that radiates from the back to the thighs bilaterally.  No bowel or bladder dysfunction. She's been quite active recently. No numbness or weakness in the lower extremities Pain Inventory Average Pain 9 Pain Right Now 10 My pain is constant and sharp  In the last 24 hours, has pain interfered with the following? General activity 9 Relation with others 9 Enjoyment of life 9 What TIME of day is your pain at its worst? daytime Sleep (in general) Poor  Pain is worse with: walking, standing and some activites Pain improves with: rest, therapy/exercise and medication Relief from Meds: 5  Mobility walk without assistance how many minutes can you walk? 5  Function disabled: date disabled . Do you have any goals in this area?  no  Neuro/Psych No problems in this area  Prior Studies Any changes since last visit?  no  Physicians involved in your care Any changes since last visit?  no   Family History  Problem Relation Age of Onset  . Asthma Father   . Diabetes Brother   . Hypertension Brother    Social History   Social History  . Marital Status: Single    Spouse Name: N/A  . Number of Children: N/A  . Years of Education: N/A   Social History Main Topics  . Smoking status: Current Every Day Smoker -- 0.50 packs/day for 30 years    Types: Cigarettes  . Smokeless tobacco: Never Used     Comment: is trying to quit at this time  . Alcohol Use: No  . Drug Use: No     Comment: past history of cocaine  and alcohol    . Sexual Activity: No     Comment:  declined condoms    Other Topics Concern  . None   Social History Narrative   Past Surgical History  Procedure Laterality Date  . Cholecystectomy    . Appendectomy    . Ectopic pregnancy surgery    . Salivary gland surgery     Past Medical History  Diagnosis Date  . HIV (human immunodeficiency virus infection) ([place])   . Sciatica   . Ruptured lumbar disc   . Osteoporosis   . 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 TB (pulmonary tuberculosis) 1993     exposure, treated   . Herniated disc 10/07/2011   BP 106/69 mmHg  Pulse 56  Resp 14  SpO2 98%  Opioid Risk Score:   Fall Risk Score:  `1  Depression screen PHQ 2/9  Depression screen [facility] 2/9 08/06/2015 07/31/2015 01/22/2015 01/03/2015 12/19/2014 10/10/2014 10/08/2014  Decreased Interest 0 0 0 0 0 0 0  Down, Depressed, Hopeless 0 0 0 0 0 0 0  PHQ - 2 Score 0 0 0 0 0 0 0  Altered sleeping - - - - - - -  Tired, decreased energy - - - - - - -  Change in appetite - - - - - - -  Feeling bad or failure about yourself  - - - - - - -  Trouble concentrating - - - - - - -  Moving slowly or fidgety/restless - - - - - - -  Suicidal thoughts - - - - - - -  PHQ-9 Score - - - - - - -     Review of Systems  All other systems reviewed and are negative.      Objective:   Physical Exam  Constitutional: She is oriented to person, place, and time. She appears well-developed and well-nourished.  HENT:  Head: Normocephalic and atraumatic.  Eyes: Conjunctivae and EOM are normal. Pupils are equal, round, and reactive to light.  Neck: Normal range of motion.  Neurological: She is alert and oriented to person, place, and time. She exhibits normal muscle tone.  Motor strength is 5/5 bilateral hip flexors and knee extensors ankle dorsiflexors and plantar flexors  Skin: Skin is warm and dry.  Psychiatric: She has a normal mood and affect.  Nursing note and vitals reviewed.  She has tenderness palpation bilateral L4-5  and S1 lumbar paraspinals. Some tenderness at the PSIS bilaterally. No pain over the greater trochanters of the hip. Lumbar flexion is 75% extension is 0-25% lateral bending is 50% bilaterally.       Assessment & Plan:  1. Lumbar spondylosis without myelopathy. She did get a good but temporary relief with a left-sided L3 L4 L5 medial branch blocks This was done with lidocaine only. We'll repeat with lidocaine plus dexamethasone. At this point I do not think there is a need for any further imaging studies.  2. Acute on chronic low back pain. Pain appears to be discogenic with increased during lumbar flexion. Will order Flexeril 10 mg 3 times a day when necessary she will continue her tramadol 100 mg every 8 hours

## 2015-08-12 NOTE — Patient Instructions (Signed)
You may take the Flexeril up to 3 times per day. Watch out for sedation.

## 2015-08-13 ENCOUNTER — Ambulatory Visit: Payer: Medicare HMO | Admitting: Internal Medicine

## 2015-08-19 ENCOUNTER — Ambulatory Visit: Payer: Medicare HMO | Admitting: Physical Medicine & Rehabilitation

## 2015-08-26 ENCOUNTER — Ambulatory Visit (HOSPITAL_BASED_OUTPATIENT_CLINIC_OR_DEPARTMENT_OTHER): Payer: Medicare HMO | Admitting: Physical Medicine & Rehabilitation

## 2015-08-26 ENCOUNTER — Encounter: Payer: Self-pay | Admitting: Physical Medicine & Rehabilitation

## 2015-08-26 VITALS — BP 114/76 | HR 67 | Resp 14

## 2015-08-26 DIAGNOSIS — M47817 Spondylosis without myelopathy or radiculopathy, lumbosacral region: Secondary | ICD-10-CM

## 2015-08-26 NOTE — Progress Notes (Signed)
Left Lumbar L3, L4  medial branch blocks and L 5 dorsal ramus injection under fluoroscopic guidance   Indication: Left Lumbar pain which is not relieved by medication management or other conservative care and interfering with self-care and mobility.  Informed consent was obtained after describing risks and benefits of the procedure with the patient, this includes bleeding, bruising, infection, paralysis and medication side effects.  The patient wishes to proceed and has given written consent.  The patient was placed in a prone position.  The lumbar area was marked and prepped with Betadine.  One mL of 1% lidocaine was injected into each of 3 areas into the skin and subcutaneous tissue.  Then a 22-gauge 3.5 spinal needle was inserted targeting the junction of the left S1 superior articular process and sacral ala junction.  Needle was advanced under fluoroscopic guidance.  Bone contact was made.  Omnipaque 180 was injected x 0.5 mL demonstrating no intravascular uptake.  Then a solution containing one mL of 4 mg per mL dexamethasone and 3 mL of 2% MPF lidocaine was injected x 0.5 mL.  Then the left L5 superior articular process in transverse process junction was targeted.  Bone contact was made.  Omnipaque 180 was injected x 0.5 mL demonstrating no intravascular uptake.  Then a solution containing one mL of 4 mg per mL dexamethasone and 3 mL of 2% MPF lidocaine was injected x 0.5 mL.  Then the left L4 superior articular process in transverse process junction was targeted.  Bone contact was made.  Omnipaque 180 was injected x 0.5 mL demonstrating no intravascular uptake.  Then a solution containing one mL of 4 mg per mL dexamethasone and 3 mL of 2% MPF lidocaine was injected x 0.5 mL.  Patient tolerated procedure well.  Post procedure instructions were given.

## 2015-08-26 NOTE — Progress Notes (Signed)
PROCEDURE RECORD [facility]   Name: Courtney Hamilton DOB:[birth year] MRN: [id number]  Date:08/26/2015  Physician: Claudette Laws, MD    Nurse/CMA: Kathrine Haddock, CMA  Allergies:  Allergies  Allergen Reactions  . Baclofen Other (See Comments)    Perioral paresthesia  . Bactrim [Sulfamethoxazole-Trimethoprim] Rash  . Doxycycline Rash    Consent Signed: Yes.    Is patient diabetic? No.  CBG today? NA  Pregnant: No. LMP: No LMP recorded. Patient is postmenopausal. (age 42-55)  Anticoagulants: no Anti-inflammatory: no Antibiotics: no  Procedure: Medial Branch Block L3-L4-L5 Position: Prone Start Time: 1008 End Time: 1013 Fluoro Time: 21  RN/CMA [name], CMA [name],CMA    Time 0950 1017    BP 114/74 118/80    Pulse 67 65    Respirations 16 17    O2 Sat 98 98    S/S 6 6    Pain Level 9/10 7/10     D/C home with father, patient A & O X 3, D/C instructions reviewed, and sits independently.

## 2015-09-03 ENCOUNTER — Ambulatory Visit (INDEPENDENT_AMBULATORY_CARE_PROVIDER_SITE_OTHER): Payer: Medicare HMO | Admitting: Family Medicine

## 2015-09-03 ENCOUNTER — Encounter: Payer: Self-pay | Admitting: Family Medicine

## 2015-09-03 VITALS — BP 131/76 | HR 77 | Temp 98.4°F | Wt 167.0 lb

## 2015-09-03 DIAGNOSIS — R300 Dysuria: Secondary | ICD-10-CM

## 2015-09-03 LAB — POCT UA - MICROSCOPIC ONLY

## 2015-09-03 LAB — POCT URINALYSIS DIPSTICK
GLUCOSE UA: NEGATIVE
NITRITE UA: NEGATIVE
Spec Grav, UA: 1.015
UROBILINOGEN UA: 0.2
pH, UA: 6

## 2015-09-03 MED ORDER — CEPHALEXIN 500 MG PO CAPS: 500.0000 mg | ORAL_CAPSULE | Freq: Three times a day (TID) | ORAL | Status: DC

## 2015-09-03 NOTE — Patient Instructions (Signed)

## 2015-09-03 NOTE — Progress Notes (Signed)
   Subjective:    Patient ID: Courtney Hamilton, female    DOB: [birth year], 55 y.o.   MRN: [id number]  Seen for Same day visit for   CC: dysuria  DYSURIA  Pain urinating started 3 days ago. Pain is: burning Medications tried: none Any antibiotics in the last 30 days: no3 More than 3 UTIs in the last 12 months: no STD exposure: denies; Monogamous with husband Possibly pregnant: no  Symptoms Urgency: yes Frequency: yes Blood in urine: no Pain in back:no Fever: no Vaginal discharge: no Mouth Ulcers: no  Review of Symptoms - see HPI PMH - Smoking status noted.    Objective:  BP 131/76 mmHg  Pulse 77  Temp(Src) 98.4 F (36.9 C) (Oral)  Wt 167 lb (75.751 kg)  SpO2 100%  General: NAD Cardiac: RRR, normal heart sounds, no murmurs. 2+ radial and PT pulses bilaterally Respiratory: CTAB, normal effort Abdomen: soft, nontender, nondistended,Bowel sounds present    Assessment & Plan:   1. Dysuria: Suspect UTI - Urinalysis Dipstick - POCT UA - Microscopic Only - cephALEXin (KEFLEX) 500 MG capsule; Take 1 capsule (500 mg total) by mouth 3 (three) times daily.  Dispense: 21 capsule; Refill: 0 - if pain not improved with abx then recommend repeat UA to evaluate for persistent blood as well as pelvic exam

## 2015-09-17 ENCOUNTER — Encounter: Payer: Self-pay | Admitting: Internal Medicine

## 2015-09-17 ENCOUNTER — Ambulatory Visit (INDEPENDENT_AMBULATORY_CARE_PROVIDER_SITE_OTHER): Payer: Medicare HMO | Admitting: Internal Medicine

## 2015-09-17 VITALS — BP 121/77 | HR 75 | Temp 98.6°F | Wt 165.0 lb

## 2015-09-17 DIAGNOSIS — M25552 Pain in left hip: Secondary | ICD-10-CM

## 2015-09-17 DIAGNOSIS — B20 Human immunodeficiency virus [HIV] disease: Secondary | ICD-10-CM | POA: Diagnosis not present

## 2015-09-17 LAB — CBC WITH DIFFERENTIAL/PLATELET
BASOS ABS: 79 {cells}/uL (ref 0–200)
Basophils Relative: 1 %
EOS ABS: 395 {cells}/uL (ref 15–500)
Eosinophils Relative: 5 %
HEMATOCRIT: 40.6 % (ref 35.0–45.0)
Hemoglobin: 13 g/dL (ref 11.7–15.5)
LYMPHS PCT: 27 %
Lymphs Abs: 2133 cells/uL (ref 850–3900)
MCH: 28.5 pg (ref 27.0–33.0)
MCHC: 32 g/dL (ref 32.0–36.0)
MCV: 89 fL (ref 80.0–100.0)
MONO ABS: 790 {cells}/uL (ref 200–950)
MPV: 9.4 fL (ref 7.5–12.5)
Monocytes Relative: 10 %
NEUTROS PCT: 57 %
Neutro Abs: 4503 cells/uL (ref 1500–7800)
Platelets: 319 10*3/uL (ref 140–400)
RBC: 4.56 MIL/uL (ref 3.80–5.10)
RDW: 14.7 % (ref 11.0–15.0)
WBC: 7.9 10*3/uL (ref 3.8–10.8)

## 2015-09-17 MED ORDER — DICLOFENAC SODIUM 1 % TD GEL: 2.0000 g | Freq: Four times a day (QID) | TRANSDERMAL | Status: DC

## 2015-09-17 NOTE — Progress Notes (Signed)
Patient ID: Courtney Hamilton, female   DOB: Aug 01, 1960, 55 y.o.   MRN: [id number]       Patient ID: Courtney Hamilton, female   DOB: 1961-01-27, 55 y.o.   MRN: [id number]  HPI CD 4 count of 250.VL<20 in march on atripla. Discussed changing to different regimen which she is willing though is worried about side effects. Otherwise, she tolerates atripla without difficulty.  She has ongoing hip arthritis, recently had cortisone injection  Right wrist injury improving  Outpatient Encounter Prescriptions as of 09/17/2015  Medication Sig  . albuterol (PROVENTIL HFA;VENTOLIN HFA) 108 (90 BASE) MCG/ACT inhaler Inhale 2 puffs into the lungs every 6 (six) hours as needed for wheezing or shortness of breath.  . ATRIPLA 600-200-300 MG tablet TAKE 1 TABLET BY MOUTH AT BEDTIME.  Marland Kitchen Biotin 5000 MCG CAPS Take 5,000 mcg by mouth daily.  . Calcium-Magnesium-Zinc 167-83-8 MG TABS Take 1 tablet by mouth daily.  . cephALEXin (KEFLEX) 500 MG capsule Take 1 capsule (500 mg total) by mouth 3 (three) times daily.  . clotrimazole (MYCELEX) 10 MG troche Take 10 mg by mouth as needed (thrush). Reported on 05/28/2015  . Cyanocobalamin 1000 MCG SUBL Place 1,000 mcg under the tongue daily.   . cyclobenzaprine (FLEXERIL) 10 MG tablet Take 1 tablet (10 mg total) by mouth 3 (three) times daily as needed for muscle spasms.  . diclofenac sodium (VOLTAREN) 1 % GEL Apply 2 g topically 4 (four) times daily.  . traMADol (ULTRAM) 50 MG tablet Take 2 tablets (100 mg total) by mouth every 8 (eight) hours as needed.  . vitamin C (ASCORBIC ACID) 500 MG tablet Take 500 mg by mouth daily.  . vitamin E 400 UNIT capsule Take 400 Units by mouth daily.   No facility-administered encounter medications on file as of 09/17/2015.     Patient Active Problem List   Diagnosis Date Noted  . Chronic low back pain without sciatica 02/18/2015  . Muscle strain 01/22/2015  . Fall 01/14/2015  . Abdominal pain 10/08/2014  . Generalized anxiety disorder  06/04/2014  . Ready to quit smoking 04/06/2014  . Anxiety state 01/07/2014  . Panic attacks 12/22/2013  . Lumbosacral spondylosis without myelopathy 09/29/2013  . 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 Lumbar paraspinal muscle spasm 01/16/2013  . Lower GI bleed 05/10/2012  . Reflux 12/13/2011  . Herpes simplex 10/07/2011  . Peripheral neuropathy ([place]) 10/07/2011  . Hypercholesteremia 10/07/2011  . History of tuberculosis exposure 10/01/2011  . Recurrent UTI 08/06/2011  . HIV positive ([place]) 08/03/2011  . Sleep disorder 08/03/2011  . Back pain 07/21/2011  . Osteoporosis 03/09/2008     There are no preventive care reminders to display for this patient.   Review of Systems  Constitutional: Negative for fever, chills, diaphoresis, activity change, appetite change, fatigue and unexpected weight change.  HENT: Negative for congestion, sore throat, rhinorrhea, sneezing, trouble swallowing and sinus pressure.  Eyes: Negative for photophobia and visual disturbance.  Respiratory: Negative for cough, chest tightness, shortness of breath, wheezing and stridor.  Cardiovascular: Negative for chest pain, palpitations and leg swelling.  Gastrointestinal: Negative for nausea, vomiting, abdominal pain, diarrhea, constipation, blood in stool, abdominal distention and anal bleeding.  Genitourinary: Negative for dysuria, hematuria, flank pain and difficulty urinating.  Musculoskeletal: Negative for myalgias, back pain, joint swelling, arthralgias and gait problem.  Skin: Negative for color change, pallor, rash and wound.  Neurological: Negative for dizziness, tremors, weakness and light-headedness.  Hematological: Negative for adenopathy. Does not bruise/bleed  easily.  Psychiatric/Behavioral: Negative for behavioral problems, confusion, sleep disturbance, dysphoric mood, decreased concentration and agitation.    Physical Exam   BP 121/77 mmHg  Pulse 75  Temp(Src) 98.6 F (37 C)  (Oral)  Wt 165 lb (74.844 kg) Physical Exam  Constitutional:  oriented to person, place, and time. appears well-developed and well-nourished. No distress.  HENT: [place]/AT, PERRLA, no scleral icterus Mouth/Throat: Oropharynx is clear and moist. No oropharyngeal exudate.  Cardiovascular: Normal rate, regular rhythm and normal heart sounds. Exam reveals no gallop and no friction rub.  No murmur heard.  Pulmonary/Chest: Effort normal and breath sounds normal. No respiratory distress.  has no wheezes.  Neck = supple, no nuchal rigidity Abdominal: Soft. Bowel sounds are normal.  exhibits no distension. There is no tenderness.  Lymphadenopathy: no cervical adenopathy. No axillary adenopathy Neurological: alert and oriented to person, place, and time.  Skin: Skin is warm and dry. No rash noted. No erythema.  Psychiatric: a normal mood and affect.  behavior is normal.   Lab Results  Component Value Date   CD4TCELL 13* 05/28/2015   Lab Results  Component Value Date   CD4TABS 250* 05/28/2015   CD4TABS 340* 01/03/2015   CD4TABS 270* 04/19/2014   Lab Results  Component Value Date   HIV1RNAQUANT <20 05/28/2015   Lab Results  Component Value Date   HEPBSAB NEG 10/01/2011   No results found for: RPR  CBC Lab Results  Component Value Date   WBC 7.4 05/28/2015   RBC 4.62 05/28/2015   HGB 13.5 05/28/2015   HCT 40.6 05/28/2015   PLT 316 05/28/2015   MCV 87.9 05/28/2015   MCH 29.2 05/28/2015   MCHC 33.3 05/28/2015   RDW 14.0 05/28/2015   LYMPHSABS 1.9 05/28/2015   MONOABS 0.7 05/28/2015   EOSABS 0.2 05/28/2015   BASOSABS 0.0 05/28/2015   BMET Lab Results  Component Value Date   NA 141 05/28/2015   K 4.1 05/28/2015   CL 106 05/28/2015   CO2 26 05/28/2015   GLUCOSE 62* 05/28/2015   BUN 10 05/28/2015   CREATININE 0.71 05/28/2015   CALCIUM 9.6 05/28/2015   GFRNONAA >89 05/28/2015   GFRAA >89 05/28/2015     Assessment and Plan  - hiv disease = will check labs and see if she  has [id number] to discuss change in regimen. Ideally would like to get her off of truvada and switch to taf based regimen  - hip pain = will try voltaren cream bid to see if it helps her symptoms   Meet in 2 wk with pharmacy to change regimen off of atripla

## 2015-09-18 LAB — COMPLETE METABOLIC PANEL WITH GFR
ALT: 8 U/L (ref 6–29)
AST: 13 U/L (ref 10–35)
Albumin: 4 g/dL (ref 3.6–5.1)
Alkaline Phosphatase: 123 U/L (ref 33–130)
BUN: 7 mg/dL (ref 7–25)
CALCIUM: 9 mg/dL (ref 8.6–10.4)
CHLORIDE: 106 mmol/L (ref 98–110)
CO2: 22 mmol/L (ref 20–31)
CREATININE: 0.66 mg/dL (ref 0.50–1.05)
Glucose, Bld: 59 mg/dL — ABNORMAL LOW (ref 65–99)
POTASSIUM: 4.3 mmol/L (ref 3.5–5.3)
Sodium: 143 mmol/L (ref 135–146)
Total Bilirubin: 0.3 mg/dL (ref 0.2–1.2)
Total Protein: 7.3 g/dL (ref 6.1–8.1)

## 2015-09-18 LAB — HIV-1 RNA QUANT-NO REFLEX-BLD

## 2015-09-18 LAB — T-HELPER CELL (CD4) - (RCID CLINIC ONLY)
CD4 % Helper T Cell: 15 % — ABNORMAL LOW (ref 33–55)
CD4 T Cell Abs: 330 /uL — ABNORMAL LOW (ref 400–2700)

## 2015-09-24 LAB — HLA B*5701: HLA-B*5701 w/rflx HLA-B High: NEGATIVE

## 2015-09-26 ENCOUNTER — Telehealth: Payer: Self-pay | Admitting: *Deleted

## 2015-09-26 NOTE — Telephone Encounter (Signed)
PA needed for Diclofenac 1% cream, completed and faxed.

## 2015-10-09 ENCOUNTER — Ambulatory Visit (INDEPENDENT_AMBULATORY_CARE_PROVIDER_SITE_OTHER): Payer: Medicare HMO | Admitting: Pharmacist Clinician (PhC)/ Clinical Pharmacy Specialist

## 2015-10-09 DIAGNOSIS — B20 Human immunodeficiency virus [HIV] disease: Secondary | ICD-10-CM | POA: Diagnosis not present

## 2015-10-09 MED ORDER — DOLUTEGRAVIR SODIUM 50 MG PO TABS: 50.0000 mg | ORAL_TABLET | Freq: Every day | ORAL | 3 refills | Status: DC

## 2015-10-09 MED ORDER — EMTRICITABINE-TENOFOVIR AF 200-25 MG PO TABS: 1.0000 | ORAL_TABLET | Freq: Every day | ORAL | 3 refills | Status: DC

## 2015-10-09 MED FILL — TIVICAY 50 MG TABLET: 50 | 30 days supply | Qty: 30 | Fill #0

## 2015-10-09 MED FILL — DESCOVY 200-25 MG TABS: 200-25 | 30 days supply | Qty: 30 | Fill #0

## 2015-10-09 NOTE — Progress Notes (Signed)
Patient ID: Courtney Hamilton, female   DOB: 1960-04-12, 55 y.o.   MRN: [id number]  HPI: Courtney Hamilton is a 55 y.o. female with HIV on Atripla. She presents to be switched to TAF based regimen; pt has osteoporosis. 7/11 CD4 330, HIV RNA < 20. HLA-B*[id number] negative.   Allergies: Allergies  Allergen Reactions  . Baclofen Other (See Comments)    Perioral paresthesia  . Bactrim [Sulfamethoxazole-Trimethoprim] Rash  . Doxycycline Rash    Past Medical History: Past Medical History:  Diagnosis Date  . Herniated disc 10/07/2011  . HIV (human immunodeficiency virus infection) ([place])   . Osteoporosis   . Ruptured lumbar disc   . Sciatica   . 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 TB (pulmonary tuberculosis) 1993    exposure, treated     Social History: Social History   Social History  . Marital status: Single    Spouse name: N/A  . Number of children: N/A  . Years of education: N/A   Social History Main Topics  . Smoking status: Current Every Day Smoker    Packs/day: 0.50    Years: 30.00    Types: Cigarettes  . Smokeless tobacco: Never Used     Comment: is trying to quit at this time  . Alcohol use No  . Drug use: No     Comment: past history of cocaine  and alcohol    . Sexual activity: No     Comment: declined condoms    Other Topics Concern  . Not on file   Social History Narrative  . No narrative on file    Current Regimen: Atripla 600-200-300 mg daily at bedtime.  Labs: HIV 1 RNA Quant (copies/mL)  Date Value  09/17/2015 <20  05/28/2015 <20  01/03/2015 <20   CD4 (no units)  Date Value  02/23/2011 136   CD4 T Cell Abs (/uL)  Date Value  09/17/2015 330 (L)  05/28/2015 250 (L)  01/03/2015 340 (L)   Hep B S Ab (no units)  Date Value  10/01/2011 NEG   Hepatitis B Surface Ag (no units)  Date Value  10/01/2011 NEGATIVE   HCV Ab (no units)  Date Value  10/01/2011 NEGATIVE    CrCl: CrCl cannot be calculated (Unknown ideal  weight.).  Lipids:    Component Value Date/Time   CHOL 274 (H) 05/28/2015 1233   TRIG 169 (H) 05/28/2015 1233   HDL 49 05/28/2015 1233   CHOLHDL 5.6 (H) 05/28/2015 1233   VLDL 34 (H) 05/28/2015 1233   LDLCALC 191 (H) 05/28/2015 1233    Assessment: Patient states that she does not have any issues with Atripla. We explained that Atripla is associated with many side effects, and specifically bone and kidney toxicities from TDF. We reviewed all of the TAF-based regimens, and patient opted to try Descovy+Tivicay. She has taken up to 5 pills at once in the past for HIV, so she feels comfortable remembering to take 2 pills. Few side effects with this regimen and can be taken regardless of meals.  Recommendations: -Discontinue Atripla -Initiate Descovy 200-25 mg daily and Tivicay 50 mg daily -Return to clinic on 9/20 for labs -F/u visit with Dr. Baxter Flattery on 11/16   Gwenlyn Perking, PharmD PGY1 Pharmacy Resident Pager: [phone] 10/09/2015 1:01 PM

## 2015-10-09 NOTE — Patient Instructions (Signed)
Stop Atripla Start Tivicay 50mg  PO daily Start Descovy 1 tab daily

## 2015-10-09 NOTE — Progress Notes (Signed)
Patient ID: Courtney Hamilton, female   DOB: Dec 04, 1960, 55 y.o.   MRN: [id number] Saw pt with Renee. Agreed with plan.

## 2015-10-10 ENCOUNTER — Telehealth: Payer: Self-pay | Admitting: *Deleted

## 2015-10-10 DIAGNOSIS — M25552 Pain in left hip: Secondary | ICD-10-CM

## 2015-10-10 MED ORDER — VOLTAREN 1 % TD GEL: 2.0000 g | Freq: Four times a day (QID) | TRANSDERMAL | 3 refills | Status: DC

## 2015-10-10 NOTE — Telephone Encounter (Signed)
Medicaid denied coverage for diclofenac gel rx.  Part D requires "brand."  Will change rx to "brand" and resend to the pharmacy.

## 2015-10-31 DIAGNOSIS — M0550 Rheumatoid polyneuropathy with rheumatoid arthritis of unspecified site: Secondary | ICD-10-CM | POA: Diagnosis not present

## 2015-10-31 DIAGNOSIS — Z0000 Encounter for general adult medical examination without abnormal findings: Secondary | ICD-10-CM | POA: Diagnosis not present

## 2015-10-31 DIAGNOSIS — R69 Illness, unspecified: Secondary | ICD-10-CM | POA: Diagnosis not present

## 2015-11-04 ENCOUNTER — Ambulatory Visit: Payer: Medicare HMO | Admitting: Internal Medicine

## 2015-11-05 ENCOUNTER — Ambulatory Visit (INDEPENDENT_AMBULATORY_CARE_PROVIDER_SITE_OTHER): Payer: Medicare HMO | Admitting: Family Medicine

## 2015-11-05 ENCOUNTER — Encounter: Payer: Self-pay | Admitting: Family Medicine

## 2015-11-05 DIAGNOSIS — G4700 Insomnia, unspecified: Secondary | ICD-10-CM | POA: Diagnosis not present

## 2015-11-05 DIAGNOSIS — Z720 Tobacco use: Secondary | ICD-10-CM | POA: Diagnosis not present

## 2015-11-05 DIAGNOSIS — F17200 Nicotine dependence, unspecified, uncomplicated: Secondary | ICD-10-CM

## 2015-11-05 DIAGNOSIS — S0990XA Unspecified injury of head, initial encounter: Secondary | ICD-10-CM | POA: Diagnosis not present

## 2015-11-05 DIAGNOSIS — Z87891 Personal history of nicotine dependence: Secondary | ICD-10-CM | POA: Insufficient documentation

## 2015-11-05 NOTE — Assessment & Plan Note (Signed)
I was unable to go into full smoking assessment during this visit. I recommended 1800 quit line. I also referred her to Dr. Valentina Lucks for medication counseling. She will schedule her appointment with him today.

## 2015-11-05 NOTE — Assessment & Plan Note (Signed)
Likely secondary to medication side effects. I recommended f/u with her infectious dx specialist to adjust meds. I recommended not to use Trazodone which might have expired. She is advised to come see her PCP soon to discuss medication for insomnia. In the mean time conservative measures recommended. She agreed with plan.

## 2015-11-05 NOTE — Assessment & Plan Note (Addendum)
Very benign HEENT exam. No neurologic deficit. Patient reassured. May use Tylenol as needed for pain. Return precaution and red flag symptoms that warrants ED visit discussed. F/U as needed.

## 2015-11-05 NOTE — Progress Notes (Signed)
Subjective:     Patient ID: Courtney Hamilton, female   DOB: 1960/10/30, 55 y.o.   MRN: [id number]  Head Injury   The incident occurred 3 to 5 days ago (5  days ago). The injury mechanism was a direct blow (She was trying to pick up something from the floor and she hit her forehead against the edge of the counter top in her kitchen). There was no loss of consciousness. There was no blood loss. Quality: Soreness to touch. The pain is at a severity of 3/10. The pain is mild. The pain has been fluctuating (Feels pain with pressure) since the injury. Pertinent negatives include no blurred vision, disorientation, headaches, memory loss, numbness, tinnitus or vomiting. Associated symptoms comments: Slight headache on and off. She has tried ice for the symptoms. The treatment provided mild relief.  Insomnia: Patient stated since she got started on new medication for HIV she has been having difficulty sleeping. She was told by her nurse that she can use Trazodone at night for sleep. She stated Trazodone was prescribed a long time ago and she still have some at home. She is wondering if she can take this meds. Smoking: She will like to quit smoking.  Current Outpatient Prescriptions on File Prior to Visit  Medication Sig Dispense Refill  . albuterol (PROVENTIL HFA;VENTOLIN HFA) 108 (90 BASE) MCG/ACT inhaler Inhale 2 puffs into the lungs every 6 (six) hours as needed for wheezing or shortness of breath. 1 Inhaler 0  . Biotin 5000 MCG CAPS Take 5,000 mcg by mouth daily.    . Calcium-Magnesium-Zinc 167-83-8 MG TABS Take 1 tablet by mouth daily.    . cephALEXin (KEFLEX) 500 MG capsule Take 1 capsule (500 mg total) by mouth 3 (three) times daily. (Patient not taking: Reported on 10/09/2015) 21 capsule 0  . clotrimazole (MYCELEX) 10 MG troche Take 10 mg by mouth as needed (thrush). Reported on 05/28/2015    . Cyanocobalamin 1000 MCG SUBL Place 1,000 mcg under the tongue daily.     . cyclobenzaprine (FLEXERIL) 10 MG tablet  Take 1 tablet (10 mg total) by mouth 3 (three) times daily as needed for muscle spasms. 30 tablet 1  . dolutegravir (TIVICAY) 50 MG tablet Take 1 tablet (50 mg total) by mouth daily. 90 tablet 3  . emtricitabine-tenofovir AF (DESCOVY) 200-25 MG tablet Take 1 tablet by mouth daily. 90 tablet 3  . traMADol (ULTRAM) 50 MG tablet Take 2 tablets (100 mg total) by mouth every 8 (eight) hours as needed. 180 tablet 5  . vitamin C (ASCORBIC ACID) 500 MG tablet Take 500 mg by mouth daily.    . vitamin E 400 UNIT capsule Take 400 Units by mouth daily.    . VOLTAREN 1 % GEL Apply 2 g topically 4 (four) times daily. 100 g 3   No current facility-administered medications on file prior to visit.    Past Medical History:  Diagnosis Date  . Herniated disc 10/07/2011  . HIV (human immunodeficiency virus infection) ([place])   . Osteoporosis   . Ruptured lumbar disc   . Sciatica   . 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 TB (pulmonary tuberculosis) 1993    exposure, treated    Vitals:   11/05/15 1023  BP: 116/80  Pulse: 73  Temp: 98.1 F (36.7 C)  TempSrc: Oral  Weight: 169 lb (76.7 kg)     Review of Systems  Constitutional: Negative for fatigue.  HENT: Negative for tinnitus.   Eyes: Negative for blurred vision and visual disturbance.  Respiratory: Negative.   Cardiovascular: Negative.   Gastrointestinal: Negative.  Negative for vomiting.  Neurological: Negative for tremors, seizures, numbness and headaches.  Psychiatric/Behavioral: Negative for memory loss.       Objective:   Physical Exam  Constitutional: She appears well-developed. No distress.  HENT:  Head: Normocephalic and atraumatic. Head is without contusion and without laceration.    Right Ear: Tympanic membrane normal.  Left Ear: Tympanic membrane normal.  Mouth/Throat: Uvula is midline and oropharynx is clear and moist.  Cardiovascular: Normal rate, regular rhythm and normal heart sounds.   No murmur  heard. Pulmonary/Chest: Effort normal and breath sounds normal. No respiratory distress. She has no wheezes.  Neurological: She is alert. She has normal strength and normal reflexes. She is not disoriented. No cranial nerve deficit or sensory deficit. She displays a negative Romberg sign. GCS eye subscore is 4. GCS verbal subscore is 5. GCS motor subscore is 6.  Nursing note and vitals reviewed.      Assessment:     Head injury Insomnia  Smoking    Plan:     Check problem list.

## 2015-11-05 NOTE — Patient Instructions (Addendum)
Please schedule appointment with Dr. Valentina Lucks for smoking cessation.  Head Injury, Adult You have a head injury. Headaches and throwing up (vomiting) are common after a head injury. It should be easy to wake up from sleeping. Sometimes you must stay in the hospital. Most problems happen within the first 24 hours. Side effects may occur up to 7-10 days after the injury.  WHAT ARE THE TYPES OF HEAD INJURIES? Head injuries can be as minor as a bump. Some head injuries can be more severe. More severe head injuries include:  A jarring injury to the brain (concussion).  A bruise of the brain (contusion). This mean there is bleeding in the brain that can cause swelling.  A cracked skull (skull fracture).  Bleeding in the brain that collects, clots, and forms a bump (hematoma). WHEN SHOULD I GET HELP RIGHT AWAY?   You are confused or sleepy.  You cannot be woken up.  You feel sick to your stomach (nauseous) or keep throwing up (vomiting).  Your dizziness or unsteadiness is getting worse.  You have very bad, lasting headaches that are not helped by medicine. Take medicines only as told by your doctor.  You cannot use your arms or legs like normal.  You cannot walk.  You notice changes in the black spots in the center of the colored part of your eye (pupil).  You have clear or bloody fluid coming from your nose or ears.  You have trouble seeing. During the next 24 hours after the injury, you must stay with someone who can watch you. This person should get help right away (call 911 in the [place] if you start to shake and are not able to control it (have seizures), you pass out, or you are unable to wake up. HOW CAN I PREVENT A HEAD INJURY IN THE FUTURE?  Wear seat belts.  Wear a helmet while bike riding and playing sports like football.  Stay away from dangerous activities around the house. WHEN CAN I RETURN TO NORMAL ACTIVITIES AND ATHLETICS? See your doctor before doing these  activities. You should not do normal activities or play contact sports until 1 week after the following symptoms have stopped:  Headache that does not go away.  Dizziness.  Poor attention.  Confusion.  Memory problems.  Sickness to your stomach or throwing up.  Tiredness.  Fussiness.  Bothered by bright lights or loud noises.  Anxiousness or depression.  Restless sleep. MAKE SURE YOU:   Understand these instructions.  Will watch your condition.  Will get help right away if you are not doing well or get worse.   This information is not intended to replace advice given to you by your health care provider. Make sure you discuss any questions you have with your health care provider.   Document Released: 02/06/2008 Document Revised: 03/16/2014 Document Reviewed: 10/31/2012 Elsevier Interactive Patient Education [employer].

## 2015-11-07 ENCOUNTER — Ambulatory Visit (INDEPENDENT_AMBULATORY_CARE_PROVIDER_SITE_OTHER): Payer: Medicare HMO | Admitting: Pharmacist

## 2015-11-07 ENCOUNTER — Encounter: Payer: Self-pay | Admitting: Family Medicine

## 2015-11-07 ENCOUNTER — Ambulatory Visit (INDEPENDENT_AMBULATORY_CARE_PROVIDER_SITE_OTHER): Payer: Medicare HMO | Admitting: Family Medicine

## 2015-11-07 ENCOUNTER — Encounter: Payer: Self-pay | Admitting: Pharmacist

## 2015-11-07 DIAGNOSIS — Z789 Other specified health status: Secondary | ICD-10-CM

## 2015-11-07 DIAGNOSIS — H04123 Dry eye syndrome of bilateral lacrimal glands: Secondary | ICD-10-CM | POA: Diagnosis not present

## 2015-11-07 DIAGNOSIS — Z720 Tobacco use: Secondary | ICD-10-CM

## 2015-11-07 DIAGNOSIS — M6248 Contracture of muscle, other site: Secondary | ICD-10-CM | POA: Diagnosis not present

## 2015-11-07 DIAGNOSIS — F17200 Nicotine dependence, unspecified, uncomplicated: Secondary | ICD-10-CM

## 2015-11-07 DIAGNOSIS — G4700 Insomnia, unspecified: Secondary | ICD-10-CM

## 2015-11-07 DIAGNOSIS — M62838 Other muscle spasm: Secondary | ICD-10-CM

## 2015-11-07 MED ORDER — NORTRIPTYLINE HCL 25 MG PO CAPS: 25.0000 mg | ORAL_CAPSULE | Freq: Every day | ORAL | 1 refills | Status: DC

## 2015-11-07 MED ORDER — NICOTINE 21 MG/24HR TD PT24: 21.0000 mg | MEDICATED_PATCH | Freq: Every day | TRANSDERMAL | 1 refills | Status: DC

## 2015-11-07 MED ORDER — NICOTINE 10 MG IN INHA: 1.0000 | RESPIRATORY_TRACT | 1 refills | Status: DC | PRN

## 2015-11-07 MED ORDER — CYCLOBENZAPRINE HCL 10 MG PO TABS: 10.0000 mg | ORAL_TABLET | Freq: Three times a day (TID) | ORAL | 0 refills | Status: DC | PRN

## 2015-11-07 MED FILL — DESCOVY 200-25 MG TABS: 200-25 | 30 days supply | Qty: 30 | Fill #1

## 2015-11-07 MED FILL — TIVICAY 50 MG TABLET: 50 | 30 days supply | Qty: 30 | Fill #1

## 2015-11-07 NOTE — Patient Instructions (Signed)
Muscle Cramps and Spasms Muscle cramps and spasms are when muscles tighten by themselves. They usually get better within minutes. Muscle cramps are painful. They are usually stronger and last longer than muscle spasms. Muscle spasms may or may not be painful. They can last a few seconds or much longer. HOME CARE  Drink enough fluid to keep your pee (urine) clear or pale yellow.  Massage, stretch, and relax the muscle.  Use a warm towel, heating pad, or warm shower water on tight muscles.  Place ice on the muscle if it is tender or in pain.  Put ice in a plastic bag.  Place a towel between your skin and the bag.  Leave the ice on for 15-20 minutes, 03-04 times a day.  Only take medicine as told by your doctor. GET HELP RIGHT AWAY IF:  Your cramps or spasms get worse, happen more often, or do not get better with time. MAKE SURE YOU:  Understand these instructions.  Will watch your condition.  Will get help right away if you are not doing well or get worse.   This information is not intended to replace advice given to you by your health care provider. Make sure you discuss any questions you have with your health care provider.   Document Released: 02/06/2008 Document Revised: 06/20/2012 Document Reviewed: 02/10/2012 Elsevier Interactive Patient Education 2016 Elsevier Inc.  

## 2015-11-07 NOTE — Progress Notes (Signed)
S:  Patient arrives in good spirits, ambulating without assistance.   Patient arrives for evaluation/assistance with tobacco dependence.  Patient was referred on 11/05/15.  Patient was last seen by Primary Care Provider today after our visit.    Age when started using tobacco on a daily basis: 55 years old. Number of Cigarettes per day 15-16. Brand smoked [place]. Estimated Nicotine Content per Cigarette (mg) 1.2.  Estimated Nicotine intake per day 18mg .   Smokes first cigarette 30 minutes after waking with coffee Smokes 2 times per night.   This is an increase from 1/night with recent change in HIV therapy. Estimated Fagerstrom Score 6/10.   Most recent quit attempt: several months ago (quit x 1 week in [place] 7 years ago) Longest time ever been tobacco free 7 days.  Medications used in prior in past cessation efforts include: wellbutrin (ineffective), gum (swallowed and didn't like the taste), nicotine patches 21 mg then 14 mg patches. Fake plastic cigarette.  Pt reports she is not interested in Chantix as she is very afraid of disturbing dreams. She states that she thinks the nicotine inhaler might be helpful.   Rates IMPORTANCE of quitting tobacco on 1-10 scale of 9 (10 would be stopping eating candy in the bed at night). Rates CONFIDENCE of quitting tobacco on 1-10 scale of 10. Patient set quit date of 11/14/15  Most common triggers to use tobacco include; absence of husband, eating and with coffee, reading, husband also smokes however he is a truck driver and is gone for 5-6 weeks at a time.  Motivation to quit: grandchildren, prevent breathing issues, tired of hiding the fact that she smokes, "tired of being a slave to smoking"  Pt reports worsening insomnia- falls asleep around 2am and awake at 11am- but complains of restless sleep. Insomnia has worsened since recent change in ART therapy from atripla to descovy+tivicay. A home health nurse told her to take her HIV meds to the AM 1  week ago but this has produced no change in sleep pattern. Pt reports she takes melatonin 10 mg qHS x 1 month for sleep and tries to play on the computer before bed to put her to sleep.   Pt additionally complains of shooting pain in ankles/toes and soreness in her left shoulder which she thinks is a pulled muscle 2/2 raking. Has taken flexeril in the past for pulled muscles.  A/P: Moderate Nicotine Dependence of 39 years duration in a patient who is an excellent candidate for success because of motivation and dedication to quit. Initiated nicotine replacement tx of nicotine 21 mg patches daily starting today and nicotine inhaler PRN after quit date (max=16/day). Patient set quit date of 11/14/15. Patient counseled on purpose, proper use, and potential adverse effects, including wearing the patch through the night/replacing daily and 'puffing' on the inhalers instead of deep inhalation.  Initiated nortriptyline 25 mg nightly for smoking cessation/insomnia. Consulted with ID clinic pharmacist, Dr. Onnie Boer, to ensure this was a safe option for the patient. Patient to follow up with ID clinic 11/27/15. Counseled on proper sleep hygiene including avoidance of "blue light" screens at least 1 hour before bed. Discussed that high doses of melatonin without titration may worsen insomnia.  Patient to see Dr. Lajuana Ripple immediately following pharmacy clinic visit for complaints of muscle pain.   Written information provided. Provided information on 1 800-QUIT NOW support program.  Total time in face-to-face counseling 60 minutes.  Patient seen with Mechele Dawley, PharmD Candidate and Deirdre Pippins,  PharmD Resident and Bennye Alm, PharmD Resident.

## 2015-11-07 NOTE — Progress Notes (Signed)
   Subjective: CC: left shoulder pain [id number] Courtney Hamilton is a 55 y.o. female presenting to clinic today for same day appointment. PCP: Ronnie Doss, DO Concerns today include:  1. Left shoulder pain Patient reports to pharmacy clinic for tobacco cessation and notes that she has a 3 day history of left shoulder pain.  Pain is constant and worse with flexion of shoulder.  She has used heating pad but no oral therapies.  Patient notes that she was raking leaves recently and shoulder pain developed about 1-2 days later.  She denies weakness, numbness or tingling.  No popping or clicking.  Sees Dr Alysia Penna for PM&R.     Social History Reviewed: active smoker. FamHx and MedHx reviewed.  Please see EMR  ROS: Per HPI  Objective: Office vital signs reviewed. See EMR (vitals obtained during pharmacy visit)  Physical Examination:  General: Awake, alert, well nourished, No acute distress Extremities: warm, well perfused, No edema, cyanosis or clubbing; +2 pulses bilaterally MSK: Normal gait and station; patient has full AROM of bilateral shoulders but experiences pain with flexion and abduction of left shoulder, most prominent at ~130 degrees.  Negative empty can, negative neer's.  No TTP to shoulder.  Moderate increased tonicity of trapezius on left compared to right w/ TTP.  No bony deformities. Neuro: UE Strength and sensation grossly intact  Assessment/ Plan: 55 y.o. female   1. Trapezius muscle spasm.  No evidence of rotator cuff injury or tear on exam - Recommend NSAIDs - Refill on Flexeril given - Continue home exercises and stretches, remain active - Return precautions reviewed - Follow up prn   Courtney Norlander, DO PGY-3, [facility]

## 2015-11-07 NOTE — Patient Instructions (Signed)
Starting taking nortriptyline 25 mg nightly.  Start using nicotine patches 21 mg daily. After your quit date, start using the nicotine inhaler when you have the urge to smoke.   Your quit date is September 7th. You can do it!  Turn off all "blue screens" at least 1 hour before you go to bed.

## 2015-11-07 NOTE — Assessment & Plan Note (Signed)
Moderate Nicotine Dependence of 39 years duration in a patient who is an excellent candidate for success because of motivation and dedication to quit. Initiated nicotine replacement tx of nicotine 21 mg patches daily starting today and nicotine inhaler PRN after quit date (max=16/day). Patient set quit date of 11/14/15. Patient counseled on purpose, proper use, and potential adverse effects, including wearing the patch through the night/replacing daily and 'puffing' on the inhalers instead of deep inhalation.  Initiated nortriptyline 25 mg nightly for smoking cessation/insomnia. Consulted with ID clinic pharmacist, Dr. Onnie Boer, to ensure this was a safe option for the patient. Patient to follow up with ID clinic 11/27/15. Counseled on proper sleep hygiene including avoidance of "blue light" screens at least 1 hour before bed. Discussed that high doses of melatonin without titration may worsen insomnia.

## 2015-11-08 ENCOUNTER — Telehealth: Payer: Self-pay | Admitting: Pharmacist

## 2015-11-08 NOTE — Telephone Encounter (Signed)
Nicotrol Oral inhaler was NOT covered by insurance.  Patient contacted and informed.  Following conversation of options we decided to try the nortriptyline and patch then reeevalate need for additional tx (lozenge or gum).   Patient verbalized understanding of treatment plan.  She will contact our office if she is unsuccessful with nortriptyline plus patch AND then we will decide on the addition of gum or lozenge.   [facility] contacted to clarify change in tx plan and cancel the rejected prescription for oral inhaler.

## 2015-11-13 NOTE — Progress Notes (Signed)
Patient ID: Courtney Hamilton, female   DOB: [birth year], 55 y.o.   MRN: [id number] Reviewed: Agree with Dr. Graylin Shiver documentation and management.

## 2015-11-20 ENCOUNTER — Telehealth: Payer: Self-pay | Admitting: Pharmacist

## 2015-11-20 NOTE — Telephone Encounter (Signed)
Pt reports she is down to 3-4 cigarettes/day now and is sleeping 6-7 hours/night. Using patches and nortriptyline. See chart for telephone encounter. Will call back in 1 week.

## 2015-11-20 NOTE — Telephone Encounter (Signed)
Called patient as a follow up to her visit with Dr. Valentina Lucks in [facility] for smoking cessation to assess success after implementation of NRT (patches and/or gum or lozenge) and nortriptyline. Pt reports "i'm getting there", down to 3-4 cigarettes/day. Pt reports she is only using nortriptyline and patches. Patient denies nausea or sleep disturbance. Pt reports she is sleeping much better than she was at first, now averaging 6-7 hours/night. Pt reports she does not feel she is oversleeping and states that her mood is fine.   Congratulated patient on her success with reducing the number of cigarettes/day. Encouraged her to continue cutting back. Educated that she can use gum or lozenges PRN with patches and nortriptyline. Will call back in 1 week.   Carlean Jews, Pharm.D. PGY1 Pharmacy Resident 9/13/20174:52 PM Pager [phone]

## 2015-11-25 ENCOUNTER — Ambulatory Visit: Payer: Medicare HMO | Admitting: Physical Medicine & Rehabilitation

## 2015-11-27 ENCOUNTER — Other Ambulatory Visit: Payer: Medicare HMO

## 2015-11-28 ENCOUNTER — Telehealth: Payer: Self-pay | Admitting: Pharmacist

## 2015-11-28 NOTE — Telephone Encounter (Signed)
Called pt to check on progress with smoking cessation as a follow up to visit in family medicine clinic with Dr. Valentina Lucks. Per patient- she is doing OK, but is out of town. Patient requests that I call back tomorrow.   Carlean Jews, Pharm.D. PGY1 Pharmacy Resident 9/21/20171:15 PM Pager [phone]

## 2015-11-29 NOTE — Telephone Encounter (Signed)
Called pt to check on progress with smoking cessation as a follow up to visit in family medicine clinic with Dr. Valentina Lucks. Pt reports she is "doing better". Yesterday she only smoked 6 cigarettes. Today (11:46 AM ) she reports she has only had 1 cigarette. Patient states that she will aim to smoke 5 or less today.   Her self-stated goal is no more than 2 cigarettes/day by her next appointment. Congratulated patient on progress. Pt denies any further questions and expresses thanks for the follow up.   Carlean Jews, Pharm.D. PGY1 Pharmacy Resident 9/22/201711:47 AM Pager [phone]

## 2015-12-05 ENCOUNTER — Other Ambulatory Visit: Payer: Self-pay | Admitting: *Deleted

## 2015-12-05 DIAGNOSIS — G4700 Insomnia, unspecified: Secondary | ICD-10-CM

## 2015-12-05 MED ORDER — NORTRIPTYLINE HCL 25 MG PO CAPS: 25.0000 mg | ORAL_CAPSULE | Freq: Every day | ORAL | 0 refills | Status: DC

## 2015-12-05 NOTE — Telephone Encounter (Signed)
Refill request for 90 day supply.  Nakeya Adinolfi L, RN  

## 2015-12-06 MED FILL — TIVICAY 50 MG TABLET: 50 | 30 days supply | Qty: 30 | Fill #2

## 2015-12-06 MED FILL — DESCOVY 200-25 MG TABS: 200-25 | 30 days supply | Qty: 30 | Fill #2

## 2015-12-25 ENCOUNTER — Other Ambulatory Visit: Payer: Self-pay | Admitting: Internal Medicine

## 2015-12-25 ENCOUNTER — Telehealth: Payer: Self-pay | Admitting: *Deleted

## 2015-12-25 ENCOUNTER — Other Ambulatory Visit: Payer: Medicare HMO

## 2015-12-25 DIAGNOSIS — B20 Human immunodeficiency virus [HIV] disease: Secondary | ICD-10-CM

## 2015-12-25 DIAGNOSIS — Z79899 Other long term (current) drug therapy: Secondary | ICD-10-CM

## 2015-12-25 DIAGNOSIS — Z113 Encounter for screening for infections with a predominantly sexual mode of transmission: Secondary | ICD-10-CM

## 2015-12-25 LAB — CBC WITH DIFFERENTIAL/PLATELET
Basophils Absolute: 0 cells/uL (ref 0–200)
Basophils Relative: 0 %
EOS PCT: 4 %
Eosinophils Absolute: 308 cells/uL (ref 15–500)
HCT: 39.7 % (ref 35.0–45.0)
HEMOGLOBIN: 12.7 g/dL (ref 11.7–15.5)
LYMPHS ABS: 2079 {cells}/uL (ref 850–3900)
LYMPHS PCT: 27 %
MCH: 28.7 pg (ref 27.0–33.0)
MCHC: 32 g/dL (ref 32.0–36.0)
MCV: 89.8 fL (ref 80.0–100.0)
MPV: 8.4 fL (ref 7.5–12.5)
Monocytes Absolute: 770 cells/uL (ref 200–950)
Monocytes Relative: 10 %
NEUTROS PCT: 59 %
Neutro Abs: 4543 cells/uL (ref 1500–7800)
Platelets: 288 10*3/uL (ref 140–400)
RBC: 4.42 MIL/uL (ref 3.80–5.10)
RDW: 13.9 % (ref 11.0–15.0)
WBC: 7.7 10*3/uL (ref 3.8–10.8)

## 2015-12-25 MED FILL — TIVICAY 50 MG TABLET: 50 | 30 days supply | Qty: 30 | Fill #3

## 2015-12-25 MED FILL — DESCOVY 200-25 MG TABS: 200-25 | 30 days supply | Qty: 30 | Fill #3

## 2015-12-25 NOTE — Telephone Encounter (Signed)
Patient asking for refill of diflucan 100 mg, #14. She states that her stomach is upset, that this usually helps with the symptoms.  She also uses this for thrush. This was discontinued (completed course) in 2016. Please advise.  Landis Gandy, RN

## 2015-12-25 NOTE — Telephone Encounter (Signed)
If she has thrush, can do fluc 200mg  daily x 7-10d. If she has upset stomach, need to see pcp, consider doing a trial of tums, anti-acid.

## 2015-12-26 ENCOUNTER — Other Ambulatory Visit: Payer: Self-pay | Admitting: *Deleted

## 2015-12-26 LAB — RPR

## 2015-12-26 LAB — T-HELPER CELL (CD4) - (RCID CLINIC ONLY)
CD4 % Helper T Cell: 11 % — ABNORMAL LOW (ref 33–55)
CD4 T Cell Abs: 230 /uL — ABNORMAL LOW (ref 400–2700)

## 2015-12-26 MED ORDER — FLUCONAZOLE 200 MG PO TABS: 200.0000 mg | ORAL_TABLET | Freq: Every day | ORAL | 0 refills | Status: DC

## 2015-12-26 NOTE — Telephone Encounter (Signed)
Patient states she thinks she has thrush on her tongue, that it recurs 2x a year or so - usually when she is under stress.  Prescription sent in per Dr. Baxter Flattery.

## 2015-12-27 ENCOUNTER — Encounter: Payer: Self-pay | Admitting: Internal Medicine

## 2015-12-27 ENCOUNTER — Ambulatory Visit (INDEPENDENT_AMBULATORY_CARE_PROVIDER_SITE_OTHER): Payer: Medicare HMO | Admitting: Internal Medicine

## 2015-12-27 DIAGNOSIS — R600 Localized edema: Secondary | ICD-10-CM | POA: Diagnosis not present

## 2015-12-27 DIAGNOSIS — Z23 Encounter for immunization: Secondary | ICD-10-CM

## 2015-12-27 LAB — HIV-1 RNA QUANT-NO REFLEX-BLD: HIV 1 RNA Quant: <20 copies/mL (ref <20)

## 2015-12-27 LAB — LIPID PANEL
Cholesterol: 258 mg/dL — ABNORMAL HIGH (ref 125–200)
HDL: 47 mg/dL (ref >=46)
LDL CALC: 183 mg/dL — AB (ref <130)
Total CHOL/HDL Ratio: 5.5 Ratio — ABNORMAL HIGH (ref <=5.0)
Triglycerides: 141 mg/dL (ref <150)
VLDL: 28 mg/dL (ref <30)

## 2015-12-27 NOTE — Assessment & Plan Note (Signed)
Most consistent with gravity-dependent edema, or improves after propping up legs and worsens with standing. No cardiac history, SOB, or chest pain, so seems less likely cardiac in etiology. No calf tenderness or palpable cords, so PE unlikely.  - Knee high compression stockings to be worn during the day. Can talk off at night if legs propped up while sleeping. Prescription written to take to medical equipment store.

## 2015-12-27 NOTE — Patient Instructions (Addendum)
It was nice meeting you today [name]!  Please take the prescription I have given you to any medical supply store to pick up some knee high compression stockings.   It is important to wear these all day long. You can remove them at night, but continue to sleep with your feet propped up as you have been.   If you have any questions or concerns, please feel free to call the clinic.   Be well,  Dr. Avon Gully

## 2015-12-27 NOTE — Progress Notes (Signed)
   Subjective:    Patient ID: Courtney Hamilton, female    DOB: 06/06/1960, 55 y.o.   MRN: [id number]  HPI  Patient presents for same day visit for bilateral ankle swelling.  Ankle swelling Began about a week ago. Worse in R ankle. Has also felt tightness in her R calf. Has been sleeping with her feet propped up at night which resolved swelling and improves pain. Notices that swelling worsens after she has been on her feet for a couple hours, and worsens throughout the day. Denies trauma or any other inciting events prior to onset of pain and swelling. Has been able to wear her shoes as usual, but does reports that one pair felt tighter than usual.  Denies SOB, chest pain. No cardiac history that she is aware of.    Smoking status reviewed. Patient is current every day smoker.   Review of Systems See HPI.     Objective:   Physical Exam  Constitutional: She is oriented to person, place, and time. She appears well-developed and well-nourished.  HENT:  Head: Normocephalic and atraumatic.  Pulmonary/Chest: No respiratory distress.  Musculoskeletal:  1+ pitting edema on LE bilaterally to mid shin. Minimal TTP above R ankle. No ecchymosis, abrasions, or other skin abnormalities noted to either foot or lower leg. No gait abnormalities.   Neurological: She is alert and oriented to person, place, and time.  Skin: Skin is warm and dry.  Psychiatric: She has a normal mood and affect. Her behavior is normal.      Assessment & Plan:  Lower leg edema Most consistent with gravity-dependent edema, or improves after propping up legs and worsens with standing. No cardiac history, SOB, or chest pain, so seems less likely cardiac in etiology. No calf tenderness or palpable cords, so PE unlikely.  - Knee high compression stockings to be worn during the day. Can talk off at night if legs propped up while sleeping. Prescription written to take to medical equipment store.   Adin Hector, MD,  [facility] PGY-2 [facility] Pager [phone]

## 2016-01-22 ENCOUNTER — Encounter: Payer: Self-pay | Admitting: Internal Medicine

## 2016-01-22 ENCOUNTER — Ambulatory Visit (INDEPENDENT_AMBULATORY_CARE_PROVIDER_SITE_OTHER): Payer: Medicare HMO | Admitting: Internal Medicine

## 2016-01-22 VITALS — BP 132/88 | HR 78 | Temp 98.6°F | Wt 174.0 lb

## 2016-01-22 DIAGNOSIS — Z0000 Encounter for general adult medical examination without abnormal findings: Secondary | ICD-10-CM

## 2016-01-22 DIAGNOSIS — B20 Human immunodeficiency virus [HIV] disease: Secondary | ICD-10-CM

## 2016-01-22 DIAGNOSIS — Z23 Encounter for immunization: Secondary | ICD-10-CM | POA: Diagnosis not present

## 2016-01-22 DIAGNOSIS — Z87898 Personal history of other specified conditions: Secondary | ICD-10-CM | POA: Diagnosis not present

## 2016-01-22 MED FILL — TIVICAY 50 MG TABLET: 50 | 30 days supply | Qty: 30 | Fill #4

## 2016-01-22 MED FILL — DESCOVY 200-25 MG TABS: 200-25 | 30 days supply | Qty: 30 | Fill #4

## 2016-01-22 NOTE — Progress Notes (Signed)
RFV: hiv follow up visit Patient ID: [name], female   DOB: [birth year], 55 y.o.   MRN: [id number]  HPI 55yo F with HIV disease, Cd 4 count of 230/VL<20 currently on DLG-descovy.she reports doing well since last visit. Continues to take meds daily without difficulty. Denies any recent illnesses  Outpatient Encounter Prescriptions as of 01/22/2016  Medication Sig  . albuterol (PROVENTIL HFA;VENTOLIN HFA) 108 (90 BASE) MCG/ACT inhaler Inhale 2 puffs into the lungs every 6 (six) hours as needed for wheezing or shortness of breath.  . Biotin 5000 MCG CAPS Take 5,000 mcg by mouth daily.  . Calcium-Magnesium-Zinc 167-83-8 MG TABS Take 1 tablet by mouth daily.  . clotrimazole (MYCELEX) 10 MG troche Take 10 mg by mouth as needed (thrush). Reported on 05/28/2015  . Cyanocobalamin 1000 MCG SUBL Place 1,000 mcg under the tongue daily.   . cyclobenzaprine (FLEXERIL) 10 MG tablet Take 1 tablet (10 mg total) by mouth 3 (three) times daily as needed for muscle spasms. (Patient not taking: Reported on 11/07/2015)  . dolutegravir (TIVICAY) 50 MG tablet Take 1 tablet (50 mg total) by mouth daily.  Marland Kitchen emtricitabine-tenofovir AF (DESCOVY) 200-25 MG tablet Take 1 tablet by mouth daily.  . fluconazole (DIFLUCAN) 200 MG tablet Take 1 tablet (200 mg total) by mouth daily.  . Melatonin 10 MG CAPS Take 1 capsule by mouth at bedtime.  . nicotine (EQ NICOTINE) 21 mg/24hr patch Place 1 patch (21 mg total) onto the skin daily.  . nortriptyline (PAMELOR) 25 MG capsule Take 1 capsule (25 mg total) by mouth at bedtime.  . saccharomyces boulardii (FLORASTOR) 250 MG capsule Take 250 mg by mouth 2 (two) times daily.  . traMADol (ULTRAM) 50 MG tablet Take 2 tablets (100 mg total) by mouth every 8 (eight) hours as needed.  . vitamin C (ASCORBIC ACID) 500 MG tablet Take 500 mg by mouth daily.  . vitamin E 400 UNIT capsule Take 400 Units by mouth daily.  . VOLTAREN 1 % GEL Apply 2 g topically 4 (four) times daily.   No  facility-administered encounter medications on file as of 01/22/2016.      Patient Active Problem List   Diagnosis Date Noted  . Head injury 11/05/2015  . Insomnia 11/05/2015  . Smoking 11/05/2015  . Chronic low back pain without sciatica 02/18/2015  . Muscle strain 01/22/2015  . Fall 01/14/2015  . Abdominal pain 10/08/2014  . Generalized anxiety disorder 06/04/2014  . Ready to quit smoking 04/06/2014  . Anxiety state 01/07/2014  . Panic attacks 12/22/2013  . Lumbosacral spondylosis without myelopathy 09/29/2013  . 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 Lumbar paraspinal muscle spasm 01/16/2013  . Lower GI bleed 05/10/2012  . Reflux 12/13/2011  . Lower leg edema 12/10/2011  . Herpes simplex 10/07/2011  . Peripheral neuropathy ([place]) 10/07/2011  . Hypercholesteremia 10/07/2011  . History of tuberculosis exposure 10/01/2011  . Recurrent UTI 08/06/2011  . HIV positive ([place]) 08/03/2011  . Sleep disorder 08/03/2011  . Back pain 07/21/2011  . Osteoporosis 03/09/2008     There are no preventive care reminders to display for this patient.   Review of Systems Review of Systems  Constitutional: Negative for fever, chills, diaphoresis, activity change, appetite change, fatigue and unexpected weight change.  HENT: Negative for congestion, sore throat, rhinorrhea, sneezing, trouble swallowing and sinus pressure.  Eyes: Negative for photophobia and visual disturbance.  Respiratory: Negative for cough, chest tightness, shortness of breath, wheezing and stridor.  Cardiovascular: Negative  for chest pain, palpitations and leg swelling.  Gastrointestinal: Negative for nausea, vomiting, abdominal pain, diarrhea, constipation, blood in stool, abdominal distention and anal bleeding.  Genitourinary: Negative for dysuria, hematuria, flank pain and difficulty urinating.  Musculoskeletal: Negative for myalgias, back pain, joint swelling, arthralgias and gait problem.  Skin: Negative  for color change, pallor, rash and wound.  Neurological: Negative for dizziness, tremors, weakness and light-headedness.  Hematological: Negative for adenopathy. Does not bruise/bleed easily.  Psychiatric/Behavioral: Negative for behavioral problems, confusion, sleep disturbance, dysphoric mood, decreased concentration and agitation.    Physical Exam   BP 132/88   Pulse 78   Temp 98.6 F (37 C) (Oral)   Wt 174 lb (78.9 kg)   BMI 28.08 kg/m  Physical Exam  Constitutional:  oriented to person, place, and time. appears well-developed and well-nourished. No distress.  HENT: [place]/AT, PERRLA, no scleral icterus Mouth/Throat: Oropharynx is clear and moist. No oropharyngeal exudate.  Cardiovascular: Normal rate, regular rhythm and normal heart sounds. Exam reveals no gallop and no friction rub.  No murmur heard.  Pulmonary/Chest: Effort normal and breath sounds normal. No respiratory distress.  has no wheezes.  Neck = supple, no nuchal rigidity Lymphadenopathy: no cervical adenopathy. No axillary adenopathy Neurological: alert and oriented to person, place, and time.  Skin: Skin is warm and dry. No rash noted. No erythema.  Psychiatric: a normal mood and affect.  behavior is normal.    Lab Results  Component Value Date   CD4TCELL 11 (L) 12/25/2015   Lab Results  Component Value Date   CD4TABS 230 (L) 12/25/2015   CD4TABS 330 (L) 09/17/2015   CD4TABS 250 (L) 05/28/2015   Lab Results  Component Value Date   HIV1RNAQUANT <20 12/25/2015   Lab Results  Component Value Date   HEPBSAB NEG 10/01/2011   No results found for: RPR  CBC Lab Results  Component Value Date   WBC 7.7 12/25/2015   RBC 4.42 12/25/2015   HGB 12.7 12/25/2015   HCT 39.7 12/25/2015   PLT 288 12/25/2015   MCV 89.8 12/25/2015   MCH 28.7 12/25/2015   MCHC 32.0 12/25/2015   RDW 13.9 12/25/2015   LYMPHSABS 2,079 12/25/2015   MONOABS 770 12/25/2015   EOSABS 308 12/25/2015   BASOSABS 0 12/25/2015    BMET Lab Results  Component Value Date   NA 143 09/17/2015   K 4.3 09/17/2015   CL 106 09/17/2015   CO2 22 09/17/2015   GLUCOSE 59 (L) 09/17/2015   BUN 7 09/17/2015   CREATININE 0.66 09/17/2015   CALCIUM 9.0 09/17/2015   GFRNONAA >89 09/17/2015   GFRAA >89 09/17/2015     Assessment and Plan  HIV disease = well controlled, initially had insomnia but switched her meds to daytime, now no difficulty.  Health maintenance = needs pneumococcal vaccine booster, uptodate on flu vaccine  Hx of abn mammo= Needs mammo in the next month

## 2016-01-23 ENCOUNTER — Encounter: Payer: Self-pay | Admitting: Physical Medicine & Rehabilitation

## 2016-01-23 ENCOUNTER — Ambulatory Visit: Payer: Medicare HMO | Admitting: Internal Medicine

## 2016-01-23 ENCOUNTER — Ambulatory Visit (HOSPITAL_BASED_OUTPATIENT_CLINIC_OR_DEPARTMENT_OTHER): Payer: Medicare HMO | Admitting: Physical Medicine & Rehabilitation

## 2016-01-23 ENCOUNTER — Encounter: Payer: Medicare HMO | Attending: Physical Medicine & Rehabilitation

## 2016-01-23 VITALS — BP 120/81 | HR 72 | Resp 14

## 2016-01-23 DIAGNOSIS — M545 Low back pain: Secondary | ICD-10-CM

## 2016-01-23 DIAGNOSIS — F17200 Nicotine dependence, unspecified, uncomplicated: Secondary | ICD-10-CM | POA: Diagnosis not present

## 2016-01-23 DIAGNOSIS — G8929 Other chronic pain: Secondary | ICD-10-CM | POA: Diagnosis not present

## 2016-01-23 DIAGNOSIS — M47817 Spondylosis without myelopathy or radiculopathy, lumbosacral region: Secondary | ICD-10-CM

## 2016-01-23 DIAGNOSIS — B20 Human immunodeficiency virus [HIV] disease: Secondary | ICD-10-CM | POA: Insufficient documentation

## 2016-01-23 DIAGNOSIS — M810 Age-related osteoporosis without current pathological fracture: Secondary | ICD-10-CM | POA: Insufficient documentation

## 2016-01-23 DIAGNOSIS — R69 Illness, unspecified: Secondary | ICD-10-CM | POA: Diagnosis not present

## 2016-01-23 MED ORDER — TRAMADOL HCL 50 MG PO TABS: 100.0000 mg | ORAL_TABLET | Freq: Three times a day (TID) | ORAL | 5 refills | Status: DC | PRN

## 2016-01-23 NOTE — Addendum Note (Signed)
Addended by: Reggy Eye on: 01/23/2016 05:01 PM   Modules accepted: Orders

## 2016-01-23 NOTE — Progress Notes (Signed)
Subjective:    Patient ID: Courtney Hamilton, female    DOB: 05/26/1960, 55 y.o.   MRN: [id number]  HPI Lumbar MBB was helpful.   Seen by ID , CD 4 ct ranges from 230-350 Mod I ADL and mobility Paces herself for activity  Pain Inventory Average Pain 8 Pain Right Now 6 My pain is sharp, burning, stabbing and aching  In the last 24 hours, has pain interfered with the following? General activity 9 Relation with others 9 Enjoyment of life 9 What TIME of day is your pain at its worst? daytime Sleep (in general) Fair  Pain is worse with: walking, bending, standing and some activites Pain improves with: rest, heat/ice and medication Relief from Meds: 4  Mobility walk without assistance  Function disabled: date disabled .  Neuro/Psych No problems in this area  Prior Studies Any changes since last visit?  no  Physicians involved in your care Any changes since last visit?  no   Family History  Problem Relation Age of Onset  . Asthma Father   . Diabetes Brother   . Hypertension Brother    Social History   Social History  . Marital status: Single    Spouse name: N/A  . Number of children: N/A  . Years of education: N/A   Social History Main Topics  . Smoking status: Current Every Day Smoker    Packs/day: 0.50    Years: 30.00    Types: Cigarettes  . Smokeless tobacco: Never Used     Comment: is trying to quit at this time  . Alcohol use No  . Drug use: No     Comment: past history of cocaine  and alcohol    . Sexual activity: No     Comment: declined condoms    Other Topics Concern  . None   Social History Narrative  . None   Past Surgical History:  Procedure Laterality Date  . APPENDECTOMY    . CHOLECYSTECTOMY    . ECTOPIC PREGNANCY SURGERY    . SALIVARY GLAND SURGERY     Past Medical History:  Diagnosis Date  . Herniated disc 10/07/2011  . HIV (human immunodeficiency virus infection) ([place])   . Osteoporosis   . Ruptured lumbar disc   . Sciatica    . 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 TB (pulmonary tuberculosis) 1993    exposure, treated    BP 120/81 (BP Location: Left Arm, Patient Position: Sitting, Cuff Size: Large)   Pulse 72   Resp 14   SpO2 95%   Opioid Risk Score:   Fall Risk Score:  `1  Depression screen PHQ 2/9  Depression screen [facility] 2/9 01/22/2016 11/07/2015 11/05/2015 09/17/2015 09/03/2015 08/26/2015 08/06/2015  Decreased Interest 0 0 1 0 0 0 0  Down, Depressed, Hopeless 0 0 0 0 0 0 0  PHQ - 2 Score 0 0 1 0 0 0 0  Altered sleeping - - - - - - -  Tired, decreased energy - - - - - - -  Change in appetite - - - - - - -  Feeling bad or failure about yourself  - - - - - - -  Trouble concentrating - - - - - - -  Moving slowly or fidgety/restless - - - - - - -  Suicidal thoughts - - - - - - -  PHQ-9 Score - - - - - - -  Review of Systems  Constitutional: Negative.   HENT: Negative.   Eyes: Negative.   Respiratory: Negative.   Cardiovascular: Negative.   Gastrointestinal: Negative.   Endocrine: Negative.   Genitourinary: Negative.   Musculoskeletal: Positive for back pain.  Skin: Negative.   Allergic/Immunologic: Negative.   Neurological: Negative.   Hematological: Negative.   Psychiatric/Behavioral: Negative.   All other systems reviewed and are negative.      Objective:   Physical Exam  Constitutional: She is oriented to person, place, and time. She appears well-developed and well-nourished.  HENT:  Head: Normocephalic and atraumatic.  Eyes: Conjunctivae and EOM are normal. Pupils are equal, round, and reactive to light.  Neck: Normal range of motion.  Neurological: She is alert and oriented to person, place, and time.  Psychiatric: She has a normal mood and affect.  Nursing note and vitals reviewed.  Mild tenderness to palpation in mid to lower lumbar paraspinal muscles. No tenderness over the greater trochanters of the hip. She is good range of motion at the  hip. Lumbar range of motion is 50%, flexion less than 25%, extension 25%, lateral bending and rotation. Negative straight leg raising       Assessment & Plan:  1. Lumbar spondylosis. This is her primary etiology for chronic low back pain. She has responded well to medial branch blocks in the past. At this point she is doing relatively well. Does not feel like she needs to repeat. We discussed other medical occasions as well as discussed exercise. She may benefit from a more flexion based stretching program as well as core strengthening. Went over her exercises printed out and reviewed.

## 2016-01-23 NOTE — Patient Instructions (Signed)

## 2016-02-18 ENCOUNTER — Telehealth: Payer: Self-pay | Admitting: *Deleted

## 2016-02-18 DIAGNOSIS — N6489 Other specified disorders of breast: Secondary | ICD-10-CM

## 2016-02-18 NOTE — Addendum Note (Signed)
Addended by: Landis Gandy on: 02/18/2016 12:02 PM   Modules accepted: Orders

## 2016-02-18 NOTE — Telephone Encounter (Signed)
-----   Message from Orpah Melter, CPhT sent at 02/18/2016 10:21 AM EST ----- Regarding: Patient request Ms. Greiser said someone here said they would schedule her mammogram when she was leaving one day.  I let her know I would pass this along, didn't think we would.

## 2016-02-18 NOTE — Telephone Encounter (Signed)
Mammogram ordered 11/15, to be completed within 1 month. RN contacted [facility] to see how this could be scheduled.  Reentered order for bilateral tomography, as [facility] by [facility]. They will contact the patient to schedule. Landis Gandy, RN

## 2016-02-20 MED FILL — TIVICAY 50 MG TABLET: 50 | 30 days supply | Qty: 30 | Fill #5

## 2016-02-20 MED FILL — DESCOVY 200-25 MG TABS: 200-25 | 30 days supply | Qty: 30 | Fill #5

## 2016-03-05 ENCOUNTER — Ambulatory Visit
Admission: RE | Admit: 2016-03-05 | Discharge: 2016-03-05 | Disposition: A | Discharge status: Home / Self Care | Payer: Medicare HMO | Source: Ambulatory Visit | Attending: Internal Medicine | Admitting: Internal Medicine

## 2016-03-05 DIAGNOSIS — N6489 Other specified disorders of breast: Secondary | ICD-10-CM

## 2016-03-05 DIAGNOSIS — R928 Other abnormal and inconclusive findings on diagnostic imaging of breast: Secondary | ICD-10-CM | POA: Diagnosis not present

## 2016-03-23 ENCOUNTER — Encounter (HOSPITAL_COMMUNITY): Payer: Self-pay | Admitting: Emergency Medicine

## 2016-03-23 ENCOUNTER — Ambulatory Visit (HOSPITAL_COMMUNITY)
Admission: EM | Admit: 2016-03-23 | Discharge: 2016-03-23 | Disposition: A | Discharge status: Home / Self Care | Payer: Medicare HMO | Attending: Family Medicine | Admitting: Family Medicine

## 2016-03-23 DIAGNOSIS — S61012A Laceration without foreign body of left thumb without damage to nail, initial encounter: Secondary | ICD-10-CM | POA: Diagnosis not present

## 2016-03-23 NOTE — ED Triage Notes (Signed)
Glass panel from [place] cabinet, slid, slicing left thumb.  Site continues bleeding.  No numbness or tingling.  Tetanus was received in the last 5 years.

## 2016-03-23 NOTE — Discharge Instructions (Signed)
Keep dry for 48 hrs, read instructions. Return as needed

## 2016-03-23 NOTE — ED Notes (Signed)
Patient's laceration covered with nonadherent gauze and wrapped with coban.

## 2016-03-23 NOTE — ED Provider Notes (Signed)
[place]    CSN: [id number] Arrival date & time: 03/23/16  1322     History   Chief Complaint Chief Complaint  Patient presents with  . Laceration    HPI Courtney Hamilton is a 56 y.o. female.   The history is provided by the patient.  Laceration  Location:  Finger Finger laceration location:  L thumb Length:  2cm Depth:  Cutaneous Quality: straight   Bleeding: controlled   Time since incident:  1 hour Laceration mechanism:  Broken glass (cut from glass in [place] cabinet when fell over at home.) Pain details:    Quality:  Sharp   Severity:  Mild Foreign body present:  No foreign bodies Ineffective treatments:  Pressure Tetanus status:  Up to date   Past Medical History:  Diagnosis Date  . Herniated disc 10/07/2011  . HIV (human immunodeficiency virus infection) ([place])   . Osteoporosis   . Ruptured lumbar disc   . Sciatica   . 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 TB (pulmonary tuberculosis) 1993    exposure, treated     Patient Active Problem List   Diagnosis Date Noted  . Head injury 11/05/2015  . Insomnia 11/05/2015  . Smoking 11/05/2015  . Chronic low back pain without sciatica 02/18/2015  . Muscle strain 01/22/2015  . Fall 01/14/2015  . Abdominal pain 10/08/2014  . Generalized anxiety disorder 06/04/2014  . Ready to quit smoking 04/06/2014  . Anxiety state 01/07/2014  . Panic attacks 12/22/2013  . Lumbosacral spondylosis without myelopathy 09/29/2013  . 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 Lumbar paraspinal muscle spasm 01/16/2013  . Lower GI bleed 05/10/2012  . Reflux 12/13/2011  . Lower leg edema 12/10/2011  . Herpes simplex 10/07/2011  . Peripheral neuropathy ([place]) 10/07/2011  . Hypercholesteremia 10/07/2011  . History of tuberculosis exposure 10/01/2011  . Recurrent UTI 08/06/2011  . HIV positive ([place]) 08/03/2011  . Sleep disorder 08/03/2011  . Back pain 07/21/2011  . Osteoporosis  03/09/2008    Past Surgical History:  Procedure Laterality Date  . APPENDECTOMY    . CHOLECYSTECTOMY    . ECTOPIC PREGNANCY SURGERY    . SALIVARY GLAND SURGERY      OB History    No data available       Home Medications    Prior to Admission medications   Medication Sig Start Date End Date Taking? Authorizing Provider  Biotin 5000 MCG CAPS Take 5,000 mcg by mouth daily.   Yes Historical Provider, MD  Calcium-Magnesium-Zinc [phone] MG TABS Take 1 tablet by mouth daily.   Yes Historical Provider, MD  dolutegravir (TIVICAY) 50 MG tablet Take 1 tablet (50 mg total) by mouth daily. 10/09/15  Yes Carlyle Basques, MD  emtricitabine-tenofovir AF (DESCOVY) 200-25 MG tablet Take 1 tablet by mouth daily. 10/09/15  Yes Carlyle Basques, MD  traMADol (ULTRAM) 50 MG tablet Take 2 tablets (100 mg total) by mouth every 8 (eight) hours as needed. 01/23/16  Yes Charlett Blake, MD  vitamin C (ASCORBIC ACID) 500 MG tablet Take 500 mg by mouth daily.   Yes Historical Provider, MD  vitamin E 400 UNIT capsule Take 400 Units by mouth daily.   Yes Historical Provider, MD  albuterol (PROVENTIL HFA;VENTOLIN HFA) 108 (90 BASE) MCG/ACT inhaler Inhale 2 puffs into the lungs every 6 (six) hours as needed for wheezing or shortness of breath. 12/19/14   Leeanne Rio, MD  clotrimazole (  MYCELEX) 10 MG troche Take 10 mg by mouth as needed (thrush). Reported on 05/28/2015 07/01/11   Historical Provider, MD  Cyanocobalamin 1000 MCG SUBL Place 1,000 mcg under the tongue daily.     Historical Provider, MD  cyclobenzaprine (FLEXERIL) 10 MG tablet Take 1 tablet (10 mg total) by mouth 3 (three) times daily as needed for muscle spasms. 11/07/15   Ashly Windell Moulding, DO  fluconazole (DIFLUCAN) 200 MG tablet Take 1 tablet (200 mg total) by mouth daily. 12/26/15   Carlyle Basques, MD  Melatonin 10 MG CAPS Take 1 capsule by mouth at bedtime.    Historical Provider, MD  nicotine (EQ NICOTINE) 21 mg/24hr patch Place 1 patch (21 mg  total) onto the skin daily. 11/07/15   Zenia Resides, MD  nortriptyline (PAMELOR) 25 MG capsule TAKE 1 CAPSULE (25 MG TOTAL) BY MOUTH AT BEDTIME. 04/06/16   Ashly Windell Moulding, DO  saccharomyces boulardii (FLORASTOR) 250 MG capsule Take 250 mg by mouth 2 (two) times daily.    Historical Provider, MD  VOLTAREN 1 % GEL Apply 2 g topically 4 (four) times daily. 10/10/15   Carlyle Basques, MD    Family History Family History  Problem Relation Age of Onset  . Asthma Father   . Diabetes Brother   . Hypertension Brother     Social History Social History  Substance Use Topics  . Smoking status: Current Every Day Smoker    Packs/day: 0.50    Years: 30.00    Types: Cigarettes  . Smokeless tobacco: Never Used     Comment: is trying to quit at this time  . Alcohol use No     Allergies   Baclofen; Bactrim [sulfamethoxazole-trimethoprim]; and Doxycycline   Review of Systems Review of Systems  Constitutional: Negative.   Skin: Positive for wound.  All other systems reviewed and are negative.    Physical Exam Triage Vital Signs ED Triage Vitals  Enc Vitals Group     BP 03/23/16 1335 137/88     Pulse Rate 03/23/16 1335 66     Resp 03/23/16 1335 18     Temp 03/23/16 1335 98.2 F (36.8 C)     Temp Source 03/23/16 1335 Oral     SpO2 03/23/16 1335 100 %     Weight --      Height --      Head Circumference --      Peak Flow --      Pain Score 03/23/16 1334 10     Pain Loc --      Pain Edu? --      Excl. in [place]? --    No data found.   Updated Vital Signs BP 137/88 (BP Location: Right Arm)   Pulse 66   Temp 98.2 F (36.8 C) (Oral)   Resp 18   SpO2 100%   Visual Acuity Right Eye Distance:   Left Eye Distance:   Bilateral Distance:    Right Eye Near:   Left Eye Near:    Bilateral Near:     Physical Exam  Constitutional: She is oriented to person, place, and time.  Musculoskeletal: She exhibits tenderness.  Neurological: She is alert and oriented to person, place,  and time.  Skin: Skin is warm.  Lac to lat cuticle aspect of distal phalanx of left thumb  Nursing note and vitals reviewed.    UC Treatments / Results  Labs (all labs ordered are listed, but only abnormal results are displayed) Labs Reviewed -  No data to display  EKG  EKG Interpretation None       Radiology No results found.  Procedures .Marland KitchenLaceration Repair Date/Time: 03/23/2016 3:23 PM Performed by: Ihor Gully D Authorized by: Ihor Gully D   Consent:    Consent obtained:  Verbal   Risks discussed:  Infection Laceration details:    Location:  Finger   Finger location:  L thumb   Length (cm):  2 Repair type:    Repair type:  Simple Pre-procedure details:    Preparation:  Patient was prepped and draped in usual sterile fashion Exploration:    Wound exploration: wound explored through full range of motion and entire depth of wound probed and visualized     Wound extent: areolar tissue violated     Wound extent: no foreign bodies/material noted, no nerve damage noted and no tendon damage noted     Contaminated: no   Treatment:    Area cleansed with:  Betadine   Amount of cleaning:  Standard   Visualized foreign bodies/material removed: no   Skin repair:    Repair method:  Tissue adhesive Approximation:    Approximation:  Close   [place] border: well-aligned   Post-procedure details:    Dressing:  Non-adherent dressing   Patient tolerance of procedure:  Tolerated well, no immediate complications     (including critical care time)  Medications Ordered in UC Medications - No data to display   Initial Impression / Assessment and Plan / UC Course  I have reviewed the triage vital signs and the nursing notes.  Pertinent labs & imaging results that were available during my care of the patient were reviewed by me and considered in my medical decision making (see chart for details).       Final Clinical Impressions(s) / UC Diagnoses   Final  diagnoses:  Laceration of left thumb without damage to nail, foreign body presence unspecified, initial encounter    New Prescriptions Discharge Medication List as of 03/23/2016  3:37 PM       Billy Fischer, MD 04/07/16 2151

## 2016-03-31 ENCOUNTER — Ambulatory Visit: Payer: Medicare HMO | Admitting: Physical Medicine & Rehabilitation

## 2016-04-04 ENCOUNTER — Other Ambulatory Visit: Payer: Self-pay | Admitting: Family Medicine

## 2016-04-04 DIAGNOSIS — G4700 Insomnia, unspecified: Secondary | ICD-10-CM

## 2016-04-16 MED FILL — TIVICAY 50 MG TABLET: 50 | 30 days supply | Qty: 30 | Fill #6 | Status: TO

## 2016-04-16 MED FILL — DESCOVY 200-25 MG TABS: 200-25 | 30 days supply | Qty: 30 | Fill #6 | Status: TO

## 2016-04-20 ENCOUNTER — Other Ambulatory Visit: Payer: Self-pay | Admitting: Family Medicine

## 2016-04-20 DIAGNOSIS — G4700 Insomnia, unspecified: Secondary | ICD-10-CM

## 2016-04-22 ENCOUNTER — Ambulatory Visit: Payer: Medicare HMO | Admitting: Internal Medicine

## 2016-04-28 ENCOUNTER — Ambulatory Visit: Payer: Medicare HMO | Admitting: Internal Medicine

## 2016-05-07 ENCOUNTER — Ambulatory Visit: Payer: Medicare HMO | Admitting: Internal Medicine

## 2016-05-08 ENCOUNTER — Encounter: Payer: Self-pay | Admitting: Physical Medicine & Rehabilitation

## 2016-05-08 ENCOUNTER — Encounter: Payer: Medicare HMO | Attending: Physical Medicine & Rehabilitation

## 2016-05-08 ENCOUNTER — Ambulatory Visit (HOSPITAL_BASED_OUTPATIENT_CLINIC_OR_DEPARTMENT_OTHER): Payer: Medicare HMO | Admitting: Physical Medicine & Rehabilitation

## 2016-05-08 VITALS — BP 136/72 | HR 68

## 2016-05-08 DIAGNOSIS — M810 Age-related osteoporosis without current pathological fracture: Secondary | ICD-10-CM | POA: Diagnosis not present

## 2016-05-08 DIAGNOSIS — F17210 Nicotine dependence, cigarettes, uncomplicated: Secondary | ICD-10-CM | POA: Insufficient documentation

## 2016-05-08 DIAGNOSIS — G620 Drug-induced polyneuropathy: Secondary | ICD-10-CM | POA: Diagnosis not present

## 2016-05-08 DIAGNOSIS — B20 Human immunodeficiency virus [HIV] disease: Secondary | ICD-10-CM | POA: Diagnosis not present

## 2016-05-08 DIAGNOSIS — M47817 Spondylosis without myelopathy or radiculopathy, lumbosacral region: Secondary | ICD-10-CM

## 2016-05-08 DIAGNOSIS — R69 Illness, unspecified: Secondary | ICD-10-CM | POA: Diagnosis not present

## 2016-05-08 DIAGNOSIS — M47816 Spondylosis without myelopathy or radiculopathy, lumbar region: Secondary | ICD-10-CM | POA: Diagnosis not present

## 2016-05-08 MED ORDER — TRAMADOL HCL 50 MG PO TABS: 100.0000 mg | ORAL_TABLET | Freq: Four times a day (QID) | ORAL | 0 refills | Status: DC

## 2016-05-08 NOTE — Patient Instructions (Addendum)
Will increase tramadol to 4 times a day until the next injection performed then will reduce it again to 3 times a day after that

## 2016-05-08 NOTE — Progress Notes (Signed)
Subjective:    Patient ID: Courtney Hamilton, female    DOB: [birth year], 58 y.o.   MRN: [id number]  HPI Low back bothering on both sides, just standing to make a sandwich produces pain. 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 Patient states this is chronic.She no longer takes nortriptyline, did not feel like it helped her sleep Pain Inventory Average Pain 10 Pain Right Now 10 My pain is constant  In the last 24 hours, has pain interfered with the following? General activity 9 Relation with others 7 Enjoyment of life 9 What TIME of day is your pain at its worst? daytime Sleep (in general) Poor  Pain is worse with: bending, standing and some activites Pain improves with: rest and heat/ice Relief from Meds: 2  Mobility ability to climb steps?  yes do you drive?  yes  Function disabled: date disabled .  Neuro/Psych No problems in this area  Prior Studies Any changes since last visit?  no  Physicians involved in your care Any changes since last visit?  no   Family History  Problem Relation Age of Onset  . Asthma Father   . Diabetes Brother   . Hypertension Brother    Social History   Social History  . Marital status: Single    Spouse name: N/A  . Number of children: N/A  . Years of education: N/A   Social History Main Topics  . Smoking status: Current Every Day Smoker    Packs/day: 0.50    Years: 30.00    Types: Cigarettes  . Smokeless tobacco: Never Used     Comment: is trying to quit at this time  . Alcohol use No  . Drug use: No     Comment: past history of cocaine  and alcohol    . Sexual activity: No     Comment: declined condoms    Other Topics Concern  . Not on file   Social History Narrative  . No narrative on file   Past Surgical History:    Procedure Laterality Date  . APPENDECTOMY    . CHOLECYSTECTOMY    . ECTOPIC PREGNANCY SURGERY    . SALIVARY GLAND SURGERY     Past Medical History:  Diagnosis Date  . Herniated disc 10/07/2011  . HIV (human immunodeficiency virus infection) ([place])   . Osteoporosis   . Ruptured lumbar disc   . Sciatica   . 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 TB (pulmonary tuberculosis) 1993    exposure, treated    There were no vitals taken for this visit.  Opioid Risk Score:   Fall Risk Score:  `1  Depression screen PHQ 2/9  Depression screen [facility] 2/9 01/22/2016 11/07/2015 11/05/2015 09/17/2015 09/03/2015 08/26/2015 08/06/2015  Decreased Interest 0 0 1 0 0 0 0  Down, Depressed, Hopeless 0 0 0 0 0 0 0  PHQ - 2 Score 0 0 1 0 0 0 0  Altered sleeping - - - - - - -  Tired, decreased energy - - - - - - -  Change in appetite - - - - - - -  Feeling bad or failure about yourself  - - - - - - -  Trouble concentrating - - - - - - -  Moving slowly or fidgety/restless - - - - - - -  Suicidal thoughts - - - - - - -  PHQ-9 Score - - - - - - -    Review of Systems  Constitutional: Positive for unexpected weight change.  HENT: Negative.   Eyes: Negative.   Respiratory: Negative.   Cardiovascular: Negative.   Gastrointestinal: Negative.   Endocrine: Negative.   Genitourinary: Negative.   Musculoskeletal: Negative.   Skin: Negative.   Allergic/Immunologic: Negative.   Neurological: Negative.   Hematological: Negative.   Psychiatric/Behavioral: Negative.   All other systems reviewed and are negative.      Objective:   Physical Exam  Constitutional: She is oriented to person, place, and time. She appears well-developed and well-nourished.  HENT:  Head: Normocephalic and atraumatic.  Eyes: Conjunctivae and EOM are normal. Pupils are equal, round, and reactive to light.  Neurological: She is alert and oriented to person, place, and time.  Psychiatric: She has a normal mood  and affect.  Nursing note and vitals reviewed.   decreased pinprick sensation below the knees bilaterally.  Absent left Achilles reflex. Normal right Achilles reflex. Normal bilateral patellar reflexes. Sensation more in the upper limbs. Limbs pain on left side greater than right side low back palpation. Pain with left lateral leaning versus right      Assessment & Plan:  1. Lumbar spondylosis without myelopathy, patient is getting some pain radiating to the lateral leg. 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 It is difficult to identify a nerve root. At this point. The primary complaint is back pain and not radiating pain. Recommend repeat L3-L4-5, medial branch/dorsal ramus injection on the left side. If she has residual pain on the right side after her seizure, consider doing the right side following month.  Patient has infectious disease follow-up at the end of this month. Will likely have blood work. We will need to get infectious disease clearance for injection  Planing of increased pain levels, will temporarily increase tramadol to 4 times a day Resume 3 times a day dosing after injection

## 2016-05-11 MED FILL — TIVICAY 50 MG TABLET: 50 | 30 days supply | Qty: 30 | Fill #0

## 2016-05-11 MED FILL — DESCOVY 200-25 MG TABS: 200-25 | 30 days supply | Qty: 30 | Fill #0

## 2016-05-18 DIAGNOSIS — B379 Candidiasis, unspecified: Secondary | ICD-10-CM | POA: Diagnosis not present

## 2016-05-18 DIAGNOSIS — Z0000 Encounter for general adult medical examination without abnormal findings: Secondary | ICD-10-CM | POA: Diagnosis not present

## 2016-05-18 DIAGNOSIS — R69 Illness, unspecified: Secondary | ICD-10-CM | POA: Diagnosis not present

## 2016-05-18 DIAGNOSIS — Z791 Long term (current) use of non-steroidal anti-inflammatories (NSAID): Secondary | ICD-10-CM | POA: Diagnosis not present

## 2016-05-18 DIAGNOSIS — Z6827 Body mass index (BMI) 27.0-27.9, adult: Secondary | ICD-10-CM | POA: Diagnosis not present

## 2016-05-18 DIAGNOSIS — Z79891 Long term (current) use of opiate analgesic: Secondary | ICD-10-CM | POA: Diagnosis not present

## 2016-05-18 DIAGNOSIS — M47816 Spondylosis without myelopathy or radiculopathy, lumbar region: Secondary | ICD-10-CM | POA: Diagnosis not present

## 2016-05-18 DIAGNOSIS — G4700 Insomnia, unspecified: Secondary | ICD-10-CM | POA: Diagnosis not present

## 2016-05-25 ENCOUNTER — Ambulatory Visit (INDEPENDENT_AMBULATORY_CARE_PROVIDER_SITE_OTHER): Payer: Medicare HMO | Admitting: Family Medicine

## 2016-05-25 ENCOUNTER — Encounter: Payer: Self-pay | Admitting: Family Medicine

## 2016-05-25 VITALS — BP 134/82 | HR 107 | Temp 98.8°F | Ht 66.0 in | Wt 175.6 lb

## 2016-05-25 DIAGNOSIS — R6889 Other general symptoms and signs: Secondary | ICD-10-CM | POA: Insufficient documentation

## 2016-05-25 DIAGNOSIS — R062 Wheezing: Secondary | ICD-10-CM | POA: Diagnosis not present

## 2016-05-25 HISTORY — DX: Other general symptoms and signs: R68.89

## 2016-05-25 MED ORDER — ALBUTEROL SULFATE (2.5 MG/3ML) 0.083% IN NEBU
2.5000 mg | INHALATION_SOLUTION | Freq: Once | RESPIRATORY_TRACT | Status: AC
  Administered 2016-05-25: 2.5 mg via RESPIRATORY_TRACT

## 2016-05-25 MED ORDER — IPRATROPIUM BROMIDE 0.02 % IN SOLN
0.5000 mg | Freq: Once | RESPIRATORY_TRACT | Status: AC
  Administered 2016-05-25: 0.5 mg via RESPIRATORY_TRACT

## 2016-05-25 MED ORDER — PREDNISONE 20 MG PO TABS: ORAL_TABLET | ORAL | 0 refills | Status: DC

## 2016-05-25 MED ORDER — BENZONATATE 100 MG PO CAPS: 100.0000 mg | ORAL_CAPSULE | Freq: Two times a day (BID) | ORAL | 0 refills | Status: DC | PRN

## 2016-05-25 MED ORDER — IPRATROPIUM-ALBUTEROL 0.5-2.5 (3) MG/3ML IN SOLN: 3.0000 mL | Freq: Once | RESPIRATORY_TRACT | Status: DC

## 2016-05-25 MED ORDER — ALBUTEROL SULFATE HFA 108 (90 BASE) MCG/ACT IN AERS: 2.0000 | INHALATION_SPRAY | Freq: Four times a day (QID) | RESPIRATORY_TRACT | 0 refills | Status: DC | PRN

## 2016-05-25 NOTE — Progress Notes (Signed)
Subjective:    Patient ID: Courtney Hamilton , female   DOB: Sep 04, 1960 , 56 y.o..   MRN: [id number]  HPI  Phylicia Dwyer is here for  Chief Complaint  Patient presents with  . Head and Chest Congestion   Respiratory infection:   Has been sick for 5 days. Nasal discharge: Yes Medications tried: Has tried Mucinex without relief Sick contacts: None  Symptoms Fever: None Headache or face pain: None Tooth pain: None Sneezing: None Scratchy throat: None Allergies: Yes, does have seasonal allergies Muscle aches: No Severe fatigue: Yes. Stiff neck: No Shortness of breath: Yes. She notes that she is having some trouble breathing and wheezing over the weekend especially with more ambulation Rash: No Sore throat or swollen glands: No By mouth intake has been poor. She has barely drink or eat much over the last 5 days. She is trying to eat soups and keep hydrated but it is hard as her appetite is down. She is coughing up thick white mucus. She does not have any history of asthma or COPD. She notes that she has been smoking cigarettes for many years, she typically smokes a half pack per day.  ROS see HPI Smoking Status noted  Past Medical History: Patient Active Problem List   Diagnosis Date Noted  . Flu-like symptoms 05/25/2016  . Head injury 11/05/2015  . Insomnia 11/05/2015  . Smoking 11/05/2015  . Chronic low back pain without sciatica 02/18/2015  . Muscle strain 01/22/2015  . Fall 01/14/2015  . Abdominal pain 10/08/2014  . Generalized anxiety disorder 06/04/2014  . Ready to quit smoking 04/06/2014  . Anxiety state 01/07/2014  . Panic attacks 12/22/2013  . Lumbosacral spondylosis without myelopathy 09/29/2013  . 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 Lumbar paraspinal muscle spasm 01/16/2013  . Lower GI bleed 05/10/2012  . Reflux 12/13/2011  . Lower leg edema 12/10/2011  . Herpes simplex 10/07/2011  . Peripheral neuropathy ([place]) 10/07/2011  .  Hypercholesteremia 10/07/2011  . History of tuberculosis exposure 10/01/2011  . Recurrent UTI 08/06/2011  . HIV positive ([place]) 08/03/2011  . Sleep disorder 08/03/2011  . Back pain 07/21/2011  . Osteoporosis 03/09/2008    Medications: reviewed and updated Current Outpatient Prescriptions  Medication Sig Dispense Refill  . albuterol (PROVENTIL HFA;VENTOLIN HFA) 108 (90 Base) MCG/ACT inhaler Inhale 2 puffs into the lungs every 6 (six) hours as needed for wheezing or shortness of breath. 1 Inhaler 0  . benzonatate (TESSALON) 100 MG capsule Take 1 capsule (100 mg total) by mouth 2 (two) times daily as needed for cough. 20 capsule 0  . Biotin 5000 MCG CAPS Take 5,000 mcg by mouth daily.    . Calcium-Magnesium-Zinc 167-83-8 MG TABS Take 1 tablet by mouth daily.    . clotrimazole (MYCELEX) 10 MG troche Take 10 mg by mouth as needed (thrush). Reported on 05/28/2015    . Cyanocobalamin 1000 MCG SUBL Place 1,000 mcg under the tongue daily.     . dolutegravir (TIVICAY) 50 MG tablet Take 1 tablet (50 mg total) by mouth daily. 90 tablet 3  . emtricitabine-tenofovir AF (DESCOVY) 200-25 MG tablet Take 1 tablet by mouth daily. 90 tablet 3  . fluconazole (DIFLUCAN) 200 MG tablet Take 1 tablet (200 mg total) by mouth daily. 7 tablet 0  . nicotine (EQ NICOTINE) 21 mg/24hr patch Place 1 patch (21 mg total) onto the skin daily. 28 patch 1  . predniSONE (DELTASONE) 20 MG tablet Take 60mg  x 2 days, 40mg  x  2 days, 20mg  x 3 days 13 tablet 0  . traMADol (ULTRAM) 50 MG tablet Take 2 tablets (100 mg total) by mouth 4 (four) times daily. 240 tablet 0  . vitamin C (ASCORBIC ACID) 500 MG tablet Take 500 mg by mouth daily.    . vitamin E 400 UNIT capsule Take 400 Units by mouth daily.    . VOLTAREN 1 % GEL Apply 2 g topically 4 (four) times daily. 100 g 3   No current facility-administered medications for this visit.     Social Hx:  reports that she has been smoking Cigarettes.  She has a 15.00 pack-year smoking  history. She has never used smokeless tobacco.   Objective:   BP 134/82   Pulse (!) 107   Temp 98.8 F (37.1 C) (Oral)   Ht 5\' 6"  (1.676 m)   Wt 175 lb 9.6 oz (79.7 kg)   SpO2 95%   BMI 28.34 kg/m  Physical Exam  Gen: NAD, alert, cooperative with exam HEENT:     Head: Normocephalic, atraumatic    Neck: No masses palpated. No lymphadenopathy     Ears: External ears normal, no drainage.Tympanic membranes intact, normal light reflex bilaterally, no erythema or bulging    Eyes: PERRLA, EOMI, sclera white, normal conjunctiva    Nose: nasal turbinates moist, clear nasal discharge    Throat: slightly tacky mucus membranes, no pharyngeal erythema, no tonsillar exudate. Airway is patent Cardiac: Regular rate and rhythm, normal S1/S2, no murmur, no edema, capillary refill brisk  Respiratory: Normal work of breathing, auscultation reveals bilateral inspiratory and expiratory wheezing as well as rhonchorous sounds throughout. No crackles Gastrointestinal: soft, non tender, non distended, bowel sounds present Skin: no rashes, normal turgor  Neurological: no gross deficits.  Psych: good insight, normal mood and affect  Assessment & Plan:  Flu-like symptoms Patient likely has a viral respiratory infection. Has bilateral inspiratory and expiratory wheezing, diffuse rhonchi  on exam however she is afebrile with a normal respiratory rate and O2 of 95%. She was given a DuoNeb 1 in clinic which improved her wheezing. She is a chronic smoker and likely has some degree of baseline lung disease. She has a history of HIV but her last viral load in October 2017 shows that she is undetectable, less than likely that she has an opportunistic infection. -Symptomatic therapy including plenty of rest, plenty of fluids, warm tea with honey -Albuterol inhaler sent to pharmacy for shortest of breath or wheezing -Prednisone 7 days -Tessalon perles as needed for cough -Discussed return precautions and red flag  symptoms (described in AVS) -Recommend PFT testing in a couple months when patient is feeling well to assess for possible COPD   Smitty Cords, MD [place], PGY-2

## 2016-05-25 NOTE — Assessment & Plan Note (Signed)
Patient likely has a viral respiratory infection. Has bilateral inspiratory and expiratory wheezing, diffuse rhonchi  on exam however she is afebrile with a normal respiratory rate and O2 of 95%. She was given a DuoNeb 1 in clinic which improved her wheezing. She is a chronic smoker and likely has some degree of baseline lung disease. She has a history of HIV but her last viral load in October 2017 shows that she is undetectable, less than likely that she has an opportunistic infection. -Symptomatic therapy including plenty of rest, plenty of fluids, warm tea with honey -Albuterol inhaler sent to pharmacy for shortest of breath or wheezing -Prednisone 7 days -Tessalon perles as needed for cough -Discussed return precautions and red flag symptoms (described in AVS) -Recommend PFT testing in a couple months when patient is feeling well to assess for possible COPD

## 2016-05-25 NOTE — Patient Instructions (Addendum)
Thank you for coming in today, it was so nice to see you! Today we talked about:    Respiratory infection: You have a respiratory infection. It is most likely viral and not bacterial, therefore there is no need for antibiotics  We gave you a breathing treatment while you were in the clinic. I will send in a prescription for an albuterol inhaler in case you develop any more wheezing or shortness of breath. You can use this at home.   I will also send in a prescription for a prednisone pack which will help with the inflammation and tessalon for the cough.   Smoking will make your lungs worse and can lead to lung cancer, please try to quit as soon as possible  Try drinking plenty of fluids until your urine is clear to light yellow, this will help you get better faster.   Please follow up if you don't feel better within the next 3 days or if you have fevers, or worsening shortness of breath and wheezing.   If you have any questions or concerns, please do not hesitate to call the office at [phone]. You can also message me directly via MyChart.   Sincerely,  Smitty Cords, MD

## 2016-06-01 ENCOUNTER — Ambulatory Visit (INDEPENDENT_AMBULATORY_CARE_PROVIDER_SITE_OTHER): Payer: Medicare HMO | Admitting: Internal Medicine

## 2016-06-01 ENCOUNTER — Encounter: Payer: Self-pay | Admitting: Internal Medicine

## 2016-06-01 VITALS — BP 144/92 | HR 81 | Temp 98.7°F | Wt 182.0 lb

## 2016-06-01 DIAGNOSIS — L74519 Primary focal hyperhidrosis, unspecified: Secondary | ICD-10-CM | POA: Diagnosis not present

## 2016-06-01 DIAGNOSIS — B20 Human immunodeficiency virus [HIV] disease: Secondary | ICD-10-CM

## 2016-06-01 DIAGNOSIS — I10 Essential (primary) hypertension: Secondary | ICD-10-CM | POA: Diagnosis not present

## 2016-06-01 DIAGNOSIS — G479 Sleep disorder, unspecified: Secondary | ICD-10-CM

## 2016-06-01 DIAGNOSIS — R69 Illness, unspecified: Secondary | ICD-10-CM | POA: Diagnosis not present

## 2016-06-01 LAB — CBC WITH DIFFERENTIAL/PLATELET
Basophils Absolute: 0 cells/uL (ref 0–200)
Basophils Relative: 0 %
Eosinophils Absolute: 326 cells/uL (ref 15–500)
Eosinophils Relative: 2 %
HEMATOCRIT: 38.2 % (ref 35.0–45.0)
HEMOGLOBIN: 12.4 g/dL (ref 11.7–15.5)
LYMPHS PCT: 30 %
Lymphs Abs: 4890 cells/uL — ABNORMAL HIGH (ref 850–3900)
MCH: 28.4 pg (ref 27.0–33.0)
MCHC: 32.5 g/dL (ref 32.0–36.0)
MCV: 87.6 fL (ref 80.0–100.0)
MONO ABS: 1304 {cells}/uL — AB (ref 200–950)
MPV: 9.1 fL (ref 7.5–12.5)
Monocytes Relative: 8 %
NEUTROS PCT: 60 %
Neutro Abs: 9780 cells/uL — ABNORMAL HIGH (ref 1500–7800)
Platelets: 378 10*3/uL (ref 140–400)
RBC: 4.36 MIL/uL (ref 3.80–5.10)
RDW: 14.1 % (ref 11.0–15.0)
WBC: 16.3 10*3/uL — ABNORMAL HIGH (ref 3.8–10.8)

## 2016-06-01 LAB — COMPLETE METABOLIC PANEL WITH GFR
ALBUMIN: 3.6 g/dL (ref 3.6–5.1)
ALK PHOS: 56 U/L (ref 33–130)
ALT: 12 U/L (ref 6–29)
AST: 15 U/L (ref 10–35)
BUN: 10 mg/dL (ref 7–25)
CALCIUM: 9.3 mg/dL (ref 8.6–10.4)
CHLORIDE: 102 mmol/L (ref 98–110)
CO2: 26 mmol/L (ref 20–31)
CREATININE: 1 mg/dL (ref 0.50–1.05)
GFR, Est African American: 72 mL/min (ref >=60)
GFR, Est Non African American: 63 mL/min (ref >=60)
Glucose, Bld: 82 mg/dL (ref 65–99)
POTASSIUM: 3.8 mmol/L (ref 3.5–5.3)
Sodium: 139 mmol/L (ref 135–146)
Total Bilirubin: 0.4 mg/dL (ref 0.2–1.2)
Total Protein: 7.1 g/dL (ref 6.1–8.1)

## 2016-06-01 MED ORDER — ESTROGENS, CONJUGATED 0.625 MG/GM VA CREA: 1.0000 | TOPICAL_CREAM | Freq: Every day | VAGINAL | 12 refills | Status: DC

## 2016-06-01 MED ORDER — ZOLPIDEM TARTRATE 10 MG PO TABS: 10.0000 mg | ORAL_TABLET | Freq: Every evening | ORAL | 0 refills | Status: DC | PRN

## 2016-06-01 NOTE — Progress Notes (Signed)
RFV: hiv disease follow up  Patient ID: Courtney Hamilton, female   DOB: 1960-12-01, 56 y.o.   MRN: [id number]  HPI Courtney Hamilton is a 56yo M with HIV disease, CD 4 count of 230/VL<20 in October 2016 on descovy/tivicay. She reports having good adherence. She has not have had any illnesses requiring hospitalization  Though ROS: 12 point ros is negative except for   Insomnia,Abdominal weight gain, craniofacial perspiration  Soc hx still smoking   I have reviewed her [facility] records  Outpatient Encounter Prescriptions as of 06/01/2016  Medication Sig  . albuterol (PROVENTIL HFA;VENTOLIN HFA) 108 (90 Base) MCG/ACT inhaler Inhale 2 puffs into the lungs every 6 (six) hours as needed for wheezing or shortness of breath.  . benzonatate (TESSALON) 100 MG capsule Take 1 capsule (100 mg total) by mouth 2 (two) times daily as needed for cough.  . Biotin 5000 MCG CAPS Take 5,000 mcg by mouth daily.  . Calcium-Magnesium-Zinc 167-83-8 MG TABS Take 1 tablet by mouth daily.  . clotrimazole (MYCELEX) 10 MG troche Take 10 mg by mouth as needed (thrush). Reported on 05/28/2015  . Cyanocobalamin 1000 MCG SUBL Place 1,000 mcg under the tongue daily.   . dolutegravir (TIVICAY) 50 MG tablet Take 1 tablet (50 mg total) by mouth daily.  [place] emtricitabine-tenofovir AF (DESCOVY) 200-25 MG tablet Take 1 tablet by mouth daily.  . fluconazole (DIFLUCAN) 200 MG tablet Take 1 tablet (200 mg total) by mouth daily.  . nicotine (EQ NICOTINE) 21 mg/24hr patch Place 1 patch (21 mg total) onto the skin daily.  . predniSONE (DELTASONE) 20 MG tablet Take 60mg  x 2 days, 40mg  x 2 days, 20mg  x 3 days  . traMADol (ULTRAM) 50 MG tablet Take 2 tablets (100 mg total) by mouth 4 (four) times daily.  . vitamin C (ASCORBIC ACID) 500 MG tablet Take 500 mg by mouth daily.  . vitamin E 400 UNIT capsule Take 400 Units by mouth daily.  . VOLTAREN 1 % GEL Apply 2 g topically 4 (four) times daily.   No facility-administered encounter medications  on file as of 06/01/2016.      Patient Active Problem List   Diagnosis Date Noted  . Flu-like symptoms 05/25/2016  . Head injury 11/05/2015  . Insomnia 11/05/2015  . Smoking 11/05/2015  . Chronic low back pain without sciatica 02/18/2015  . Muscle strain 01/22/2015  . Fall 01/14/2015  . Abdominal pain 10/08/2014  . Generalized anxiety disorder 06/04/2014  . Ready to quit smoking 04/06/2014  . Anxiety state 01/07/2014  . Panic attacks 12/22/2013  . Lumbosacral spondylosis without myelopathy 09/29/2013  . 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 Lumbar paraspinal muscle spasm 01/16/2013  . Lower GI bleed 05/10/2012  . Reflux 12/13/2011  . Lower leg edema 12/10/2011  . Herpes simplex 10/07/2011  . Peripheral neuropathy ([place]) 10/07/2011  . Hypercholesteremia 10/07/2011  . History of tuberculosis exposure 10/01/2011  . Recurrent UTI 08/06/2011  . HIV positive ([place]) 08/03/2011  . Sleep disorder 08/03/2011  . Back pain 07/21/2011  . Osteoporosis 03/09/2008     There are no preventive care reminders to display for this patient.   Physical Exam   BP (!) 144/92   Pulse 81   Temp 98.7 F (37.1 C) (Oral)   Wt 182 lb (82.6 kg)   BMI 29.38 kg/m   Physical Exam  Constitutional:  oriented to person, place, and time. appears well-developed and well-nourished. No distress.  HENT: [place]/AT, PERRLA, no scleral icterus  Mouth/Throat: Oropharynx is clear and moist. No oropharyngeal exudate.  Cardiovascular: Normal rate, regular rhythm and normal heart sounds. Exam reveals no gallop and no friction rub.  No murmur heard.  Pulmonary/Chest: Effort normal and breath sounds normal. No respiratory distress.  has no wheezes.  Neck = supple, no nuchal rigidity Abdominal: Soft. Bowel sounds are normal.  exhibits no distension. There is no tenderness.  Lymphadenopathy: no cervical adenopathy. No axillary adenopathy Neurological: alert and oriented to person, place, and time.  Skin: Skin  is warm and dry. No rash noted. No erythema.  Psychiatric: a normal mood and affect.  behavior is normal.   Lab Results  Component Value Date   CD4TCELL 11 (L) 12/25/2015   Lab Results  Component Value Date   CD4TABS 230 (L) 12/25/2015   CD4TABS 330 (L) 09/17/2015   CD4TABS 250 (L) 05/28/2015   Lab Results  Component Value Date   HIV1RNAQUANT <20 12/25/2015   Lab Results  Component Value Date   HEPBSAB NEG 10/01/2011   Lab Results  Component Value Date   LABRPR NON REAC 12/25/2015    CBC Lab Results  Component Value Date   WBC 7.7 12/25/2015   RBC 4.42 12/25/2015   HGB 12.7 12/25/2015   HCT 39.7 12/25/2015   PLT 288 12/25/2015   MCV 89.8 12/25/2015   MCH 28.7 12/25/2015   MCHC 32.0 12/25/2015   RDW 13.9 12/25/2015   LYMPHSABS 2,079 12/25/2015   MONOABS 770 12/25/2015   EOSABS 308 12/25/2015    BMET Lab Results  Component Value Date   NA 143 09/17/2015   K 4.3 09/17/2015   CL 106 09/17/2015   CO2 22 09/17/2015   GLUCOSE 59 (L) 09/17/2015   BUN 7 09/17/2015   CREATININE 0.66 09/17/2015   CALCIUM 9.0 09/17/2015   GFRNONAA >89 09/17/2015   GFRAA >89 09/17/2015      Assessment and Plan  hiv disease = well controlled by review of her labs. Will continue her regimen but plant to check labs since it represents 6 month lab visit  Insomnia= gave recrds on sleep hygiene and if those do not help, we will do trial of ambien  hyperhydrosis cranial facial=  She states that she has been many years out from her having menopause. Will ask to see how frequent it is occurring before making other recs  Hypertension = slightly elevated today. If still elevated at nextvisit, may need to start antihypertensive. Consider diet modification as it also maybe due to weight gain

## 2016-06-02 LAB — RPR

## 2016-06-03 LAB — HIV-1 RNA QUANT-NO REFLEX-BLD
HIV 1 RNA Quant: <20 {copies}/mL
HIV-1 RNA Quant, Log: <1.3 {Log_copies}/mL

## 2016-06-03 LAB — T-HELPER CELL (CD4) - (RCID CLINIC ONLY)
CD4 % Helper T Cell: 10 % — ABNORMAL LOW (ref 33–55)
CD4 T Cell Abs: 530 /uL (ref 400–2700)

## 2016-06-08 ENCOUNTER — Ambulatory Visit: Payer: Medicare HMO | Admitting: Physical Medicine & Rehabilitation

## 2016-06-15 MED FILL — DESCOVY 200-25 MG TABS: 200-25 | 30 days supply | Qty: 30 | Fill #1

## 2016-06-15 MED FILL — TIVICAY 50 MG TABLET: 50 | 30 days supply | Qty: 30 | Fill #1

## 2016-07-07 ENCOUNTER — Ambulatory Visit (HOSPITAL_BASED_OUTPATIENT_CLINIC_OR_DEPARTMENT_OTHER): Payer: Medicare HMO | Admitting: Physical Medicine & Rehabilitation

## 2016-07-07 ENCOUNTER — Encounter: Payer: Medicare HMO | Attending: Physical Medicine & Rehabilitation

## 2016-07-07 ENCOUNTER — Encounter: Payer: Self-pay | Admitting: Physical Medicine & Rehabilitation

## 2016-07-07 VITALS — BP 118/83 | HR 65 | Resp 14

## 2016-07-07 DIAGNOSIS — F17210 Nicotine dependence, cigarettes, uncomplicated: Secondary | ICD-10-CM | POA: Insufficient documentation

## 2016-07-07 DIAGNOSIS — R69 Illness, unspecified: Secondary | ICD-10-CM | POA: Diagnosis not present

## 2016-07-07 DIAGNOSIS — B20 Human immunodeficiency virus [HIV] disease: Secondary | ICD-10-CM | POA: Insufficient documentation

## 2016-07-07 DIAGNOSIS — M47816 Spondylosis without myelopathy or radiculopathy, lumbar region: Secondary | ICD-10-CM | POA: Diagnosis present

## 2016-07-07 DIAGNOSIS — M47817 Spondylosis without myelopathy or radiculopathy, lumbosacral region: Secondary | ICD-10-CM

## 2016-07-07 DIAGNOSIS — M810 Age-related osteoporosis without current pathological fracture: Secondary | ICD-10-CM | POA: Insufficient documentation

## 2016-07-07 NOTE — Progress Notes (Signed)
Left Lumbar L3, L4  medial branch blocks and L 5 dorsal ramus injection under fluoroscopic guidance   Indication: Left Lumbar pain which is not relieved by medication management or other conservative care and interfering with self-care and mobility.  Informed consent was obtained after describing risks and benefits of the procedure with the patient, this includes bleeding, bruising, infection, paralysis and medication side effects.  The patient wishes to proceed and has given written consent.  The patient was placed in a prone position.  The lumbar area was marked and prepped with Betadine.  One mL of 1% lidocaine was injected into each of 3 areas into the skin and subcutaneous tissue.  Then a 22-gauge 3.5 spinal needle was inserted targeting the junction of the left S1 superior articular process and sacral ala junction.  Needle was advanced under fluoroscopic guidance.  Bone contact was made. Isovue 200 was injected x 0.5 mL demonstrating no intravascular uptake.  Then a solution containing  2% MPF lidocaine was injected x 0.5 mL.  Then the left L5 superior articular process in transverse process junction was targeted.  Bone contact was made. Isovue 200 was injected x 0.5 mL demonstrating no intravascular uptake.  Then a solution containing 2% MPF lidocaine was injected x 0.5 mL.  Then the left L4 superior articular process in transverse process junction was targeted.  Bone contact was made.  Isovue 200 was injected x 0.5 mL demonstrating no intravascular uptake.  Then a solution containing  2% MPF lidocaine was injected x 0.5 mL.  Patient tolerated procedure well.  Post procedure instructions were given. 

## 2016-07-07 NOTE — Progress Notes (Signed)
PROCEDURE RECORD [facility]   Name: Jenelle Malen DOB:1960-03-17 MRN: [id number]  Date:07/07/2016  Physician: Claudette Laws, MD    Nurse/CMA: Vedder Brittian, CMA  Allergies:  Allergies  Allergen Reactions  . Baclofen Other (See Comments)    Perioral paresthesia  . Bactrim [Sulfamethoxazole-Trimethoprim] Rash  . Doxycycline Rash    Consent Signed: Yes.    Is patient diabetic? No.  CBG today? n/a   Pregnant: No. LMP: No LMP recorded. Patient is postmenopausal. (age 38-55)  Anticoagulants: no Anti-inflammatory: no Antibiotics: no  Procedure: left L3,4,5  medial branch block  Position: Prone Start Time: 11:21am  End Time: 11:30am  Fluoro Time: 32  RN/CMA Latisa Belay, CMA Demecia Northway, CMA    Time 10:45am 11:33am    BP 118/83 139/93    Pulse 65 67    Respirations 14 14    O2 Sat 98 97    S/S 6 6    Pain Level 10/10 2/10     D/C home with father, patient A & O X 3, D/C instructions reviewed, and sits independently.

## 2016-07-13 ENCOUNTER — Telehealth: Payer: Self-pay | Admitting: *Deleted

## 2016-07-13 MED FILL — TIVICAY 50 MG TABLET: 50 | 30 days supply | Qty: 30 | Fill #2

## 2016-07-13 MED FILL — DESCOVY 200-25 MG TABS: 200-25 | 30 days supply | Qty: 30 | Fill #2

## 2016-07-13 NOTE — Telephone Encounter (Signed)
Courtney Hamilton called and reports that after her 07/07/16 injection she is in so much pain.  She says it is more than she ever experienced before and has never hurt like this.  Tramadol is not helping.  Please advise

## 2016-07-13 NOTE — Telephone Encounter (Signed)
This would be a muscular pain, alternate heat with ice. If she would like short-term muscle relaxer. We can call this in. It should subside within the next week

## 2016-07-14 MED ORDER — METHOCARBAMOL 500 MG PO TABS: 500.0000 mg | ORAL_TABLET | Freq: Three times a day (TID) | ORAL | 0 refills | Status: DC | PRN

## 2016-07-14 NOTE — Telephone Encounter (Signed)
[place] notified.  She would like a muscle relaxer.

## 2016-07-14 NOTE — Telephone Encounter (Signed)
Ordered

## 2016-07-15 ENCOUNTER — Telehealth: Payer: Self-pay | Admitting: *Deleted

## 2016-07-15 NOTE — Telephone Encounter (Signed)
Cyclobenzaprine 10mg   1 po TID prn #30 no rf

## 2016-07-15 NOTE — Telephone Encounter (Signed)
Patient left a message stating that she is unable to pick up the medication that was prescribed at last visit (presumably methocarbamol). I submitted a prior authorization to her insurance. If it is denied, do we have alternatives?

## 2016-07-16 MED ORDER — CYCLOBENZAPRINE HCL 10 MG PO TABS: 10.0000 mg | ORAL_TABLET | Freq: Three times a day (TID) | ORAL | 0 refills | Status: DC | PRN

## 2016-07-16 NOTE — Telephone Encounter (Signed)
Medication ordered

## 2016-07-20 ENCOUNTER — Telehealth: Payer: Self-pay | Admitting: Family Medicine

## 2016-07-20 ENCOUNTER — Ambulatory Visit: Payer: Medicare HMO | Admitting: Family Medicine

## 2016-07-20 NOTE — Telephone Encounter (Signed)
Pt called and would like the doctor to call her. She has some questions about antibiotics that she has at her home. jw

## 2016-07-21 ENCOUNTER — Ambulatory Visit (INDEPENDENT_AMBULATORY_CARE_PROVIDER_SITE_OTHER): Payer: Medicare HMO | Admitting: Internal Medicine

## 2016-07-21 ENCOUNTER — Encounter: Payer: Self-pay | Admitting: Internal Medicine

## 2016-07-21 DIAGNOSIS — J069 Acute upper respiratory infection, unspecified: Secondary | ICD-10-CM | POA: Diagnosis not present

## 2016-07-21 MED ORDER — ALBUTEROL SULFATE HFA 108 (90 BASE) MCG/ACT IN AERS: 2.0000 | INHALATION_SPRAY | Freq: Four times a day (QID) | RESPIRATORY_TRACT | 0 refills | Status: DC | PRN

## 2016-07-21 MED ORDER — GUAIFENESIN ER 600 MG PO TB12: 600.0000 mg | ORAL_TABLET | Freq: Two times a day (BID) | ORAL | 0 refills | Status: DC

## 2016-07-21 NOTE — Progress Notes (Signed)
   [facility] Kerrin Mo, MD Phone: [phone]  Reason For Visit: SDA URI   # URI  Has been sick for 3 days. Nasal discharge: yellow  Medications tried: Tessalon perles, DayQuil  Sick contacts: None   Symptoms Fever: No Headache or face pain: none Sneezing: yes  Scratchy throat: yes  Allergies: none  Muscle aches: none  Severe fatigue: yes Stiff neck: none  Shortness of breath: yes, notes having significant cough at times. Has received albuterol inhaler for this in the past which has helped  Rash: non e  Sore throat or swollen glands:none  ROS see HPI Smoking Status noted  Objective: BP 102/64 (BP Location: Left Arm, Patient Position: Sitting, Cuff Size: Normal)   Pulse 65   Temp 98.1 F (36.7 C) (Oral)   Ht 5\' 6"  (1.676 m)   Wt 173 lb 9.6 oz (78.7 kg)   SpO2 95%   BMI 28.02 kg/m  Gen: NAD, alert, cooperative with exam HEENT: Normal    Neck: No masses palpated. No lymphadenopathy    Ears: Tympanic membranes intact, normal light reflex, no erythema, no bulging    Eyes: PERRLA, EOMI     Nose: nasal turbinates congested, erythematous     Throat: post-nasal drip noted Cardio: regular rate and rhythm, S1S2 heard, no murmurs appreciated Pulm: clear to auscultation bilaterally, no wheezes, rhonchi or rales Skin: dry, intact, no rashes or lesions  Assessment/Plan: See problem based a/p   Upper respiratory infection, viral Viral URI  - Provide Mucinex  - Refill albuterol  - if no improvement in next 3-4 days can return for follow up

## 2016-07-21 NOTE — Telephone Encounter (Signed)
Calling to ask if she can take Keflex for head cold symptoms.  Recommended that she be seen prior to taking abx, as symptoms may be viral which would not warrant abx.  Patient to call and reschedule missed appt.

## 2016-07-21 NOTE — Patient Instructions (Addendum)
As discussed can use mucinex as needed for congestion. Please follow up in  3-4 days if no improvement

## 2016-07-27 NOTE — Assessment & Plan Note (Signed)
Viral URI  - Provide Mucinex  - Refill albuterol  - if no improvement in next 3-4 days can return for follow up

## 2016-08-10 ENCOUNTER — Encounter: Payer: Medicare HMO | Attending: Physical Medicine & Rehabilitation

## 2016-08-10 ENCOUNTER — Encounter: Payer: Self-pay | Admitting: Physical Medicine & Rehabilitation

## 2016-08-10 ENCOUNTER — Ambulatory Visit (HOSPITAL_BASED_OUTPATIENT_CLINIC_OR_DEPARTMENT_OTHER): Payer: Medicare HMO | Admitting: Physical Medicine & Rehabilitation

## 2016-08-10 VITALS — BP 157/93 | HR 61

## 2016-08-10 DIAGNOSIS — M25561 Pain in right knee: Secondary | ICD-10-CM

## 2016-08-10 DIAGNOSIS — F17210 Nicotine dependence, cigarettes, uncomplicated: Secondary | ICD-10-CM | POA: Diagnosis not present

## 2016-08-10 DIAGNOSIS — M47817 Spondylosis without myelopathy or radiculopathy, lumbosacral region: Secondary | ICD-10-CM

## 2016-08-10 DIAGNOSIS — M810 Age-related osteoporosis without current pathological fracture: Secondary | ICD-10-CM | POA: Diagnosis not present

## 2016-08-10 DIAGNOSIS — R69 Illness, unspecified: Secondary | ICD-10-CM | POA: Diagnosis not present

## 2016-08-10 DIAGNOSIS — G8929 Other chronic pain: Secondary | ICD-10-CM | POA: Diagnosis not present

## 2016-08-10 DIAGNOSIS — B20 Human immunodeficiency virus [HIV] disease: Secondary | ICD-10-CM | POA: Insufficient documentation

## 2016-08-10 DIAGNOSIS — M47816 Spondylosis without myelopathy or radiculopathy, lumbar region: Secondary | ICD-10-CM | POA: Diagnosis present

## 2016-08-10 MED ORDER — TRAMADOL HCL 50 MG PO TABS: 100.0000 mg | ORAL_TABLET | Freq: Three times a day (TID) | ORAL | 5 refills | Status: DC | PRN

## 2016-08-10 NOTE — Patient Instructions (Signed)

## 2016-08-10 NOTE — Progress Notes (Signed)
Right lumbar L3, L4 medial branch blocks and L5 dorsal ramus injection under fluoroscopic guidance  Indication: Right Lumbar pain which is not relieved by medication management or other conservative care and interfering with self-care and mobility.  Hx HIV Virus no longer detectable, CD4 nl , CD 4 helper T cell low, 3/26, ID has cleared pt for inj  Informed consent was obtained after describing risks and benefits of the procedure with the patient, this includes bleeding, bruising, infection, paralysis and medication side effects. The patient wishes to proceed and has given written consent. The patient was placed in a prone position. The lumbar area was marked and prepped with Betadine. One ML of 1% lidocaine was injected into each of 3 areas into the skin and subcutaneous tissue. Then a 22-gauge 3.5in spinal needle was inserted targeting the junction of the Right S1 superior articular process and sacral ala junction. Needle was advanced under fluoroscopic guidance. Bone contact was made.Isovue 200 was injected x0.5 mL demonstrating no intravascular uptake. Then a solution containing 2% MPF lidocaine was injected x0.5 mL. Then the Right L5 superior articular process in transverse process junction was targeted. Bone contact was made.Isovue 200 was injected x0.5 mL demonstrating no intravascular uptake. Then a solution containing 2% MPF lidocaine was injected x0.5 mL. Then the Right L4 superior articular process in transverse process junction was targeted. Bone contact was made. Isovue 200 was injected x0.5 mL demonstrating no intravascular uptake. Then a solution containing2% MPF lidocaine was injected x0.5 mL Patient tolerated procedure well. Post procedure instructions were given. Please refer to post procedure form.

## 2016-08-10 NOTE — Progress Notes (Signed)
PROCEDURE RECORD [facility]   Name: Courtney Hamilton DOB:[birth year] MRN: [id number]  Date:08/10/2016  Physician: Claudette Laws, MD    Nurse/CMA: Almus Woodham CMA  Allergies:  Allergies  Allergen Reactions  . Baclofen Other (See Comments)    Perioral paresthesia  . Bactrim [Sulfamethoxazole-Trimethoprim] Rash  . Doxycycline Rash    Consent Signed: Yes.    Is patient diabetic? No.  CBG today? NA  Pregnant: No. LMP: No LMP recorded. Patient is postmenopausal. (age 8-55)  Anticoagulants: no Anti-inflammatory: no Antibiotics: no  Procedure: R MBB L3-5   Position: Prone   Start Time: 950am End Time: 957am Fluoro Time: 30s  RN/CMA Cal Gindlesperger CMA Jakyiah Briones CMA    Time 933 1002    BP 157/93 171/101    Pulse 61 60    Respirations 16 16    O2 Sat 95 96    S/S 6 6    Pain Level 9/10 6/10     D/C home with Husband Courtney Hamilton, patient A & O X 3, D/C instructions reviewed, and sits independently.

## 2016-08-18 MED FILL — DESCOVY 200-25 MG TABS: 200-25 | 30 days supply | Qty: 30 | Fill #3

## 2016-08-18 MED FILL — TIVICAY 50 MG TABLET: 50 | 30 days supply | Qty: 30 | Fill #3

## 2016-09-10 MED FILL — DESCOVY 200-25 MG TABS: 200-25 | 30 days supply | Qty: 30 | Fill #4

## 2016-09-10 MED FILL — TIVICAY 50 MG TABLET: 50 | 30 days supply | Qty: 30 | Fill #4

## 2016-09-17 ENCOUNTER — Ambulatory Visit: Payer: Medicare HMO | Admitting: Physical Medicine & Rehabilitation

## 2016-09-29 ENCOUNTER — Ambulatory Visit: Payer: Medicare HMO | Admitting: Physical Medicine & Rehabilitation

## 2016-10-07 ENCOUNTER — Other Ambulatory Visit: Payer: Self-pay | Admitting: Internal Medicine

## 2016-10-10 ENCOUNTER — Other Ambulatory Visit: Payer: Self-pay | Admitting: Family Medicine

## 2016-10-10 DIAGNOSIS — G4700 Insomnia, unspecified: Secondary | ICD-10-CM

## 2016-10-13 ENCOUNTER — Ambulatory Visit
Admission: RE | Admit: 2016-10-13 | Discharge: 2016-10-13 | Disposition: A | Discharge status: Home / Self Care | Payer: Medicare HMO | Source: Ambulatory Visit | Attending: Physical Medicine & Rehabilitation | Admitting: Physical Medicine & Rehabilitation

## 2016-10-13 ENCOUNTER — Ambulatory Visit (INDEPENDENT_AMBULATORY_CARE_PROVIDER_SITE_OTHER): Payer: Medicare HMO | Admitting: Family Medicine

## 2016-10-13 ENCOUNTER — Encounter: Payer: Self-pay | Admitting: Family Medicine

## 2016-10-13 VITALS — BP 122/78 | HR 67 | Temp 98.6°F | Wt 177.0 lb

## 2016-10-13 DIAGNOSIS — R1030 Lower abdominal pain, unspecified: Secondary | ICD-10-CM | POA: Diagnosis not present

## 2016-10-13 DIAGNOSIS — M7989 Other specified soft tissue disorders: Secondary | ICD-10-CM | POA: Diagnosis not present

## 2016-10-13 DIAGNOSIS — G8929 Other chronic pain: Secondary | ICD-10-CM

## 2016-10-13 DIAGNOSIS — M25561 Pain in right knee: Principal | ICD-10-CM

## 2016-10-13 MED ORDER — DICYCLOMINE HCL 10 MG PO CAPS: 10.0000 mg | ORAL_CAPSULE | Freq: Three times a day (TID) | ORAL | 2 refills | Status: DC

## 2016-10-13 NOTE — Progress Notes (Signed)
    Subjective:    Patient ID: Courtney Hamilton, female    DOB: September 27, 1960, 56 y.o.   MRN: [id number]   Cc: abdominal pain  Pain began 2-3 days ago after eating seafood (crab legs, oysters, muscles) in [facility]. She thinks she ate too much. Since then after eating her stomach is uncomfortable. She also noted when she yells at her grandchildren it hurts. She also endorses gas and feels like she has to have a bowel movement but will not be able to go. Has been having 2 BM a day which is abnormal for her, not loose.  Was taking a probiotic but ran out of it 2 weeks ago.  Medications tried: bentyl with some relief, mild improvement with pepto bismol Similar pain before: she thinks so, cannot remember Prior abdominal surgeries: ectopic pregnancy, chole, appy   Symptoms Nausea/vomiting: no Diarrhea: not loose stools but going more often  Constipation: no  Blood in stool: no Blood in vomit: no Fever: no Dysuria: no Loss of appetite: no Weight loss: no  Vaginal Bleeding: no Missed menstrual period: no  Review of Symptoms - see HPI PMH - Smoking status noted.  - quit smoking  Objective:  BP 122/78   Pulse 67   Temp 98.6 F (37 C) (Oral)   Wt 177 lb (80.3 kg)   SpO2 98%   BMI 28.57 kg/m  Vitals and nursing note reviewed  General: well nourished, in no acute distress Cardiac: RRR, clear S1 and S2, no murmurs, rubs, or gallops Respiratory: clear to auscultation bilaterally, no increased work of breathing Abdomen: soft, nontender, nondistended, no masses or organomegaly. Bowel sounds present Extremities: no edema or cyanosis Skin: warm and dry, no rashes noted Neuro: alert and oriented, no focal deficits   Assessment & Plan:    Lower abdominal pain  Present for past 2-3 days after eating seafood. Likely a mild gastroenteritis. Unlikely to be an allergy. No red flag symptoms, fevers, chills, blood in stool, however patient has HIV and there is possibility for opportunistic  infection.  -advised supportive care -rx given for bentyl -recommended pepto bismol  -continue probiotics -strict return precautions given, if persistent may need to do stool studies -follow up with PCP    Return if symptoms worsen or fail to improve.   Lucila Maine, DO Family Medicine Resident PGY-2

## 2016-10-13 NOTE — Patient Instructions (Signed)
  I'm sorry your stomach is bothering you!  Please use the bentyl 3 times a day with meals. Also take pepto bismol as needed throughout the day. Continue with probiotics.  Please return to be seen if you develop weight loss, blood in your stool, or no appetite. We may have to test your stool if this continues to be an issue.  If you have questions or concerns please do not hesitate to call at [phone].  Lucila Maine, DO PGY-2, [facility] 10/13/2016 11:30 AM

## 2016-10-13 NOTE — Assessment & Plan Note (Signed)
  Present for past 2-3 days after eating seafood. Likely a mild gastroenteritis. Unlikely to be an allergy. No red flag symptoms, fevers, chills, blood in stool, however patient has HIV and there is possibility for opportunistic infection.  -advised supportive care -rx given for bentyl -recommended pepto bismol  -continue probiotics -strict return precautions given, if persistent may need to do stool studies -follow up with PCP

## 2016-10-16 ENCOUNTER — Other Ambulatory Visit: Payer: Self-pay | Admitting: Family Medicine

## 2016-10-16 DIAGNOSIS — G4700 Insomnia, unspecified: Secondary | ICD-10-CM

## 2016-10-21 MED FILL — DESCOVY 200-25 MG TABS: 200-25 | 30 days supply | Qty: 30 | Fill #0

## 2016-10-21 MED FILL — TIVICAY 50 MG TABLET: 50 | 30 days supply | Qty: 30 | Fill #0

## 2016-10-23 ENCOUNTER — Other Ambulatory Visit: Payer: Self-pay | Admitting: *Deleted

## 2016-10-23 NOTE — Telephone Encounter (Signed)
The last time patient was prescribed this medication was about a year ago, and it is not on her current medication list.  Patient should make an appointment to discuss restarting this medication.  Thank you!

## 2016-10-25 ENCOUNTER — Other Ambulatory Visit: Payer: Self-pay | Admitting: Family Medicine

## 2016-10-25 DIAGNOSIS — G4700 Insomnia, unspecified: Secondary | ICD-10-CM

## 2016-10-26 NOTE — Telephone Encounter (Signed)
Dr  Darnell Level is this your pt - hasn't been here to our office

## 2016-11-11 ENCOUNTER — Other Ambulatory Visit: Payer: Medicare HMO

## 2016-11-16 ENCOUNTER — Ambulatory Visit: Payer: Medicare HMO | Admitting: Physical Medicine & Rehabilitation

## 2016-11-19 ENCOUNTER — Encounter: Payer: Self-pay | Admitting: Student

## 2016-11-19 ENCOUNTER — Ambulatory Visit (INDEPENDENT_AMBULATORY_CARE_PROVIDER_SITE_OTHER): Payer: Medicare HMO | Admitting: Student

## 2016-11-19 VITALS — BP 130/80 | HR 60 | Temp 98.2°F | Ht 66.0 in | Wt 179.0 lb

## 2016-11-19 DIAGNOSIS — J020 Streptococcal pharyngitis: Secondary | ICD-10-CM | POA: Diagnosis not present

## 2016-11-19 DIAGNOSIS — J029 Acute pharyngitis, unspecified: Secondary | ICD-10-CM

## 2016-11-19 LAB — POCT RAPID STREP A (OFFICE): Rapid Strep A Screen: NEGATIVE

## 2016-11-19 MED ORDER — NYSTATIN 100000 UNIT/ML MT SUSP: 10.0000 mL | Freq: Two times a day (BID) | OROMUCOSAL | 0 refills | Status: DC

## 2016-11-19 NOTE — Progress Notes (Signed)
Subjective:    Courtney Hamilton is a 56 y.o. old female here for sore throat.  HPI Sore throat: for four days. Feels like a popcorn is stuck in her throat. She has been gargling with salt water which helped a little. She reports feeling tired and achy overall. Denies runny nose or congestion. Denies fever, cough, shortness of breath, chest pain, nausea or vomiting. Doesn't have a good appetite. Denies sick contact but has been in around the crowd recently. Quit smoking 05/2016. History of 30 pack year smoking. Has history of HIV. She is on antiretroviral medications. She reports good compliance with this. Last viral load undetectable. Last CD4 count 530 about 6 months ago. She has an upcoming appointment with an ID in about 5 days.   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   has a past medical history of Herniated disc (10/07/2011); HIV (human immunodeficiency virus infection) ([place]); Osteoporosis; Ruptured lumbar disc; Sciatica; Substance abuse; and TB (pulmonary tuberculosis) (1993 ).  FH:  Family History  Problem Relation Age of Onset  . Asthma Father   . Diabetes Brother   . Hypertension Brother     [facility] Social History  Substance Use Topics  . Smoking status: Former Smoker    Packs/day: 0.50    Years: 30.00    Types: Cigarettes  . Smokeless tobacco: Never Used     Comment: is trying to quit at this time  . Alcohol use No     Review of Systems Review of systems negative except for pertinent positives and negatives in history of present illness above.     Objective:     Vitals:   11/19/16 1136  BP: 130/80  Pulse: 60  Temp: 98.2 F (36.8 C)  TempSrc: Oral  SpO2: 98%  Weight: 179 lb (81.2 kg)  Height: 5\' 6"  (1.676 m)   Body mass index is 28.89 kg/m.  Physical Exam GEN: appears well, no apparent distress. Head: normocephalic and atraumatic  Oropharynx: mmm without erythema or exudation.  HEM: negative for cervical or periauricular lymphadenopathies CVS: RRR, nl S1&S2, no murmurs, no edema RESP: no IWOB, good air movement bilaterally, CTAB MSK: no focal tenderness or notable swelling. No pain with range of motion in neck.  SKIN: no apparent skin lesion NEURO: alert and oiented appropriately, no gross deficits  PSYCH: euthymic mood with congruent affect    Assessment and Plan:  1. Sore throat: likely viral pharyngitis. She has 1/4 on Centor's criteria (abscence of cough). Rapid strep negative. Others on differential include oral thrush, HSV and CMV given history of HIV. However, last CD4 count 530 and last viral load undetectable. She reports good compliance with her medications. Cannot rule out flu given some fatigue and myalgia but no utility to Tamiflu given  symptoms for 4 days now. Discussed and recommended about conservative management including good hydration and trying hall or lozenges. Also gave prescription for nystatin suspension for possible esophageal candidiasis although history and exam doesn't suggest this. Patient has follow-up with ID in 5 days. - nystatin (MYCOSTATIN) 100000 UNIT/ML suspension; Take 10  mLs (1,000,000 Units total) by mouth 2 (two) times daily.  Dispense: 120 mL; Refill: 0  Return if symptoms worsen or fail to improve.  Mercy Riding, MD 11/19/16 Pager: [phone]

## 2016-11-19 NOTE — Patient Instructions (Signed)

## 2016-11-19 NOTE — Assessment & Plan Note (Addendum)
Likely viral pharyngitis. She has 1/4 on Centor's criteria (abscence of cough). Rapid strep negative. Others on differential include oral thrush, HSV and CMV given history of HIV. However, last CD4 count 530 and last viral load undetectable. She reports good compliance with her medications. Cannot rule out flu given some fatigue and myalgia but no utility to Tamiflu given  symptoms for 4 days nowDiscussed and recommended about conservative management including good hydration and trying hall or lozenges. Also gave prescription for nystatin suspension for possible esophageal candidiasis although history and exam doesn't suggest this. Patient has follow-up with ID in 5 days. - nystatin (MYCOSTATIN) 100000 UNIT/ML suspension; Take 10 mLs (1,000,000 Units total) by mouth 2 (two) times daily.  Dispense: 120 mL; Refill: 0

## 2016-11-23 ENCOUNTER — Encounter: Payer: Self-pay | Admitting: Internal Medicine

## 2016-11-23 ENCOUNTER — Ambulatory Visit (INDEPENDENT_AMBULATORY_CARE_PROVIDER_SITE_OTHER): Payer: Medicare HMO | Admitting: Internal Medicine

## 2016-11-23 ENCOUNTER — Telehealth: Payer: Self-pay | Admitting: *Deleted

## 2016-11-23 VITALS — BP 160/90 | HR 60 | Temp 98.3°F | Wt 176.8 lb

## 2016-11-23 DIAGNOSIS — I10 Essential (primary) hypertension: Secondary | ICD-10-CM

## 2016-11-23 DIAGNOSIS — M159 Polyosteoarthritis, unspecified: Secondary | ICD-10-CM

## 2016-11-23 DIAGNOSIS — B20 Human immunodeficiency virus [HIV] disease: Secondary | ICD-10-CM | POA: Diagnosis not present

## 2016-11-23 DIAGNOSIS — G479 Sleep disorder, unspecified: Secondary | ICD-10-CM | POA: Diagnosis not present

## 2016-11-23 DIAGNOSIS — R69 Illness, unspecified: Secondary | ICD-10-CM | POA: Diagnosis not present

## 2016-11-23 DIAGNOSIS — M150 Primary generalized (osteo)arthritis: Secondary | ICD-10-CM | POA: Diagnosis not present

## 2016-11-23 MED ORDER — ZOLPIDEM TARTRATE ER 6.25 MG PO TBCR: 6.2500 mg | EXTENDED_RELEASE_TABLET | Freq: Every evening | ORAL | 0 refills | Status: DC | PRN

## 2016-11-23 MED FILL — TIVICAY 50 MG TABLET: 50 | 30 days supply | Qty: 30 | Fill #1

## 2016-11-23 MED FILL — DESCOVY 200-25 MG TABS: 200-25 | 30 days supply | Qty: 30 | Fill #1

## 2016-11-23 NOTE — Telephone Encounter (Signed)
Zolpidem CR not covered by patient's insurance. Only generic, regular strength zolpidem is allowed. Please advise. Landis Gandy, RN

## 2016-11-23 NOTE — Progress Notes (Signed)
RFV: 6 month follow up for hiv disease  Patient ID: Courtney Hamilton, female   DOB: November 11, 1960, 56 y.o.   MRN: [id number]  HPI 56yo F with well controlled HIV disease, CD 4 count of 530/VL<20 (from march 2018) on tivicay/descovy. Still reports insomnia and fatigue.  Records show she was seen at her pcp for viral pharyngitis last week  Did try ambien Q 3 day but felt that she had strange side effects of sleep walking/eating  Outpatient Encounter Prescriptions as of 11/23/2016  Medication Sig  . albuterol (PROVENTIL HFA;VENTOLIN HFA) 108 (90 Base) MCG/ACT inhaler Inhale 2 puffs into the lungs every 6 (six) hours as needed for wheezing or shortness of breath.  . benzonatate (TESSALON) 100 MG capsule Take 1 capsule (100 mg total) by mouth 2 (two) times daily as needed for cough.  . Biotin 5000 MCG CAPS Take 5,000 mcg by mouth daily.  . Calcium-Magnesium-Zinc 167-83-8 MG TABS Take 1 tablet by mouth daily.  . clotrimazole (MYCELEX) 10 MG troche Take 10 mg by mouth as needed (thrush). Reported on 05/28/2015  . Cyanocobalamin 1000 MCG SUBL Place 1,000 mcg under the tongue daily.   . cyclobenzaprine (FLEXERIL) 10 MG tablet Take 1 tablet (10 mg total) by mouth 3 (three) times daily as needed for muscle spasms.  . DESCOVY 200-25 MG tablet TAKE 1 TABLET BY MOUTH DAILY.  Marland Kitchen dicyclomine (BENTYL) 10 MG capsule Take 1 capsule (10 mg total) by mouth 4 (four) times daily -  before meals and at bedtime.  . fluconazole (DIFLUCAN) 200 MG tablet Take 1 tablet (200 mg total) by mouth daily.  Marland Kitchen guaiFENesin (MUCINEX) 600 MG 12 hr tablet Take 1 tablet (600 mg total) by mouth 2 (two) times daily.  . methocarbamol (ROBAXIN) 500 MG tablet Take 1 tablet (500 mg total) by mouth every 8 (eight) hours as needed for muscle spasms.  . nicotine (EQ NICOTINE) 21 mg/24hr patch Place 1 patch (21 mg total) onto the skin daily.  Marland Kitchen nystatin (MYCOSTATIN) 100000 UNIT/ML suspension Take 10 mLs (1,000,000 Units total) by mouth 2 (two)  times daily.  . predniSONE (DELTASONE) 20 MG tablet Take 60mg  x 2 days, 40mg  x 2 days, 20mg  x 3 days  . TIVICAY 50 MG tablet TAKE 1 TABLET (50 MG TOTAL) BY MOUTH DAILY.  . traMADol (ULTRAM) 50 MG tablet Take 2 tablets (100 mg total) by mouth 3 (three) times daily as needed.  . vitamin C (ASCORBIC ACID) 500 MG tablet Take 500 mg by mouth daily.  . vitamin E 400 UNIT capsule Take 400 Units by mouth daily.  . VOLTAREN 1 % GEL Apply 2 g topically 4 (four) times daily.  Marland Kitchen zolpidem (AMBIEN) 10 MG tablet Take 1 tablet (10 mg total) by mouth at bedtime as needed for sleep.   No facility-administered encounter medications on file as of 11/23/2016.      Patient Active Problem List   Diagnosis Date Noted  . Sore throat 11/19/2016  . Upper respiratory infection, viral 07/21/2016  . Flu-like symptoms 05/25/2016  . Head injury 11/05/2015  . Insomnia 11/05/2015  . Smoking 11/05/2015  . Chronic low back pain without sciatica 02/18/2015  . Muscle strain 01/22/2015  . Fall 01/14/2015  . Lower abdominal pain 10/08/2014  . Generalized anxiety disorder 06/04/2014  . Ready to quit smoking 04/06/2014  . Anxiety state 01/07/2014  . Panic attacks 12/22/2013  . Lumbosacral spondylosis without myelopathy 09/29/2013  . 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 Lumbar paraspinal  muscle spasm 01/16/2013  . Lower GI bleed 05/10/2012  . Reflux 12/13/2011  . Lower leg edema 12/10/2011  . Herpes simplex 10/07/2011  . Peripheral neuropathy 10/07/2011  . Hypercholesteremia 10/07/2011  . History of tuberculosis exposure 10/01/2011  . Recurrent UTI 08/06/2011  . HIV positive ([place]) 08/03/2011  . Sleep disorder 08/03/2011  . Back pain 07/21/2011  . Osteoporosis 03/09/2008     There are no preventive care reminders to display for this patient.   Review of Systems + insomnia otherwise 12 point ros is negative Physical Exam   BP (!) 160/90 (BP Location: Right Arm, Patient Position: Sitting, Cuff  Size: Normal)   Pulse 60   Temp 98.3 F (36.8 C) (Oral)   Wt 176 lb 12.8 oz (80.2 kg)   BMI 28.54 kg/m   Physical Exam  Constitutional:  oriented to person, place, and time. appears well-developed and well-nourished. No distress.  HENT: [place]/AT, PERRLA, no scleral icterus Mouth/Throat: Oropharynx is clear and moist. No oropharyngeal exudate.  Cardiovascular: Normal rate, regular rhythm and normal heart sounds. Exam reveals no gallop and no friction rub.  No murmur heard.  Pulmonary/Chest: Effort normal and breath sounds normal. No respiratory distress.  has no wheezes.  Neck = supple, no nuchal rigidity Abdominal: Soft. Bowel sounds are normal.  exhibits no distension. There is no tenderness.  Lymphadenopathy: no cervical adenopathy. No axillary adenopathy Neurological: alert and oriented to person, place, and time.  Skin: Skin is warm and dry. No rash noted. No erythema.  Psychiatric: a normal mood and affect.  behavior is normal.   Lab Results  Component Value Date   CD4TCELL 10 (L) 06/01/2016   Lab Results  Component Value Date   CD4TABS 530 06/01/2016   CD4TABS 230 (L) 12/25/2015   CD4TABS 330 (L) 09/17/2015   Lab Results  Component Value Date   HIV1RNAQUANT <20 NOT DETECTED 06/01/2016   Lab Results  Component Value Date   HEPBSAB NEG 10/01/2011   Lab Results  Component Value Date   LABRPR NON REAC 06/01/2016    CBC Lab Results  Component Value Date   WBC 16.3 (H) 06/01/2016   RBC 4.36 06/01/2016   HGB 12.4 06/01/2016   HCT 38.2 06/01/2016   PLT 378 06/01/2016   MCV 87.6 06/01/2016   MCH 28.4 06/01/2016   MCHC 32.5 06/01/2016   RDW 14.1 06/01/2016   LYMPHSABS 4,890 (H) 06/01/2016   MONOABS 1,304 (H) 06/01/2016   EOSABS 326 06/01/2016    BMET Lab Results  Component Value Date   NA 139 06/01/2016   K 3.8 06/01/2016   CL 102 06/01/2016   CO2 26 06/01/2016   GLUCOSE 82 06/01/2016   BUN 10 06/01/2016   CREATININE 1.00 06/01/2016   CALCIUM 9.3  06/01/2016   GFRNONAA 63 06/01/2016   GFRAA 72 06/01/2016      Assessment and Plan  hiv disease = well controlled, we will repeat blood work today , 6 month labs to see how she is still virologically controlled  Insomnia = will try lower dose long acting to see if helpful of ambien .previously tried tramadol which was too groggy for her  Health maitenance = flu shot  Osteoarthritis of arms/hands = try prn naproxen  htn = if elevated at next visit, need to have pcp/or [place] adjust meds

## 2016-11-24 LAB — COMPLETE METABOLIC PANEL WITH GFR
AG Ratio: 1.2 (calc) (ref 1.0–2.5)
ALKALINE PHOSPHATASE (APISO): 67 U/L (ref 33–130)
ALT: 7 U/L (ref 6–29)
AST: 18 U/L (ref 10–35)
Albumin: 4.5 g/dL (ref 3.6–5.1)
BUN: 11 mg/dL (ref 7–25)
CALCIUM: 9.8 mg/dL (ref 8.6–10.4)
CO2: 28 mmol/L (ref 20–32)
CREATININE: 0.91 mg/dL (ref 0.50–1.05)
Chloride: 102 mmol/L (ref 98–110)
GFR, EST NON AFRICAN AMERICAN: 70 mL/min/{1.73_m2} (ref >=60)
GFR, Est African American: 81 mL/min/{1.73_m2} (ref >=60)
Globulin: 3.8 g/dL (calc) — ABNORMAL HIGH (ref 1.9–3.7)
Glucose, Bld: 77 mg/dL (ref 65–99)
Potassium: 4.2 mmol/L (ref 3.5–5.3)
SODIUM: 139 mmol/L (ref 135–146)
Total Bilirubin: 0.5 mg/dL (ref 0.2–1.2)
Total Protein: 8.3 g/dL — ABNORMAL HIGH (ref 6.1–8.1)

## 2016-11-24 LAB — CBC WITH DIFFERENTIAL/PLATELET
BASOS ABS: 91 {cells}/uL (ref 0–200)
BASOS PCT: 1 %
EOS ABS: 501 {cells}/uL — AB (ref 15–500)
Eosinophils Relative: 5.5 %
HEMATOCRIT: 42.8 % (ref 35.0–45.0)
Hemoglobin: 14 g/dL (ref 11.7–15.5)
LYMPHS ABS: 3358 {cells}/uL (ref 850–3900)
MCH: 27.6 pg (ref 27.0–33.0)
MCHC: 32.7 g/dL (ref 32.0–36.0)
MCV: 84.3 fL (ref 80.0–100.0)
MPV: 9.7 fL (ref 7.5–12.5)
Monocytes Relative: 8.8 %
NEUTROS ABS: 4350 {cells}/uL (ref 1500–7800)
Neutrophils Relative %: 47.8 %
Platelets: 293 10*3/uL (ref 140–400)
RBC: 5.08 10*6/uL (ref 3.80–5.10)
RDW: 12.3 % (ref 11.0–15.0)
Total Lymphocyte: 36.9 %
WBC: 9.1 10*3/uL (ref 3.8–10.8)
WBCMIX: 801 {cells}/uL (ref 200–950)

## 2016-11-24 LAB — T-HELPER CELL (CD4) - (RCID CLINIC ONLY)
CD4 T CELL HELPER: 14 % — AB (ref 33–55)
CD4 T Cell Abs: 480 /uL (ref 400–2700)

## 2016-11-25 LAB — HIV-1 RNA QUANT-NO REFLEX-BLD
HIV 1 RNA QUANT: NOT DETECTED {copies}/mL
HIV-1 RNA Quant, Log: <1.3 Log copies/mL

## 2016-11-30 ENCOUNTER — Other Ambulatory Visit: Payer: Self-pay

## 2016-11-30 MED ORDER — ZOLPIDEM TARTRATE 10 MG PO TABS: 5.0000 mg | ORAL_TABLET | Freq: Every evening | ORAL | 1 refills | Status: DC | PRN

## 2016-11-30 NOTE — Telephone Encounter (Signed)
Per Dr Baxter Flattery patient is to take one-half of Ambien 10mg  tablets to see if she is able to tolerate the medication . Medication sent to pharmacy.   Laverle Patter, RN

## 2016-11-30 NOTE — Telephone Encounter (Signed)
Patient called the office.  She did not do well with the Ambien and the 5mg  tablets is what she had before.   Will you consider changing the medications?  The Ambien CR is not covered by her insurance.    Laverle Patter, RN

## 2016-12-17 ENCOUNTER — Encounter: Payer: Medicare HMO | Attending: Physical Medicine & Rehabilitation

## 2016-12-17 ENCOUNTER — Ambulatory Visit (HOSPITAL_BASED_OUTPATIENT_CLINIC_OR_DEPARTMENT_OTHER): Payer: Medicare HMO | Admitting: Physical Medicine & Rehabilitation

## 2016-12-17 VITALS — BP 119/83 | HR 63 | Resp 14

## 2016-12-17 DIAGNOSIS — M25561 Pain in right knee: Secondary | ICD-10-CM | POA: Insufficient documentation

## 2016-12-17 DIAGNOSIS — M545 Low back pain, unspecified: Secondary | ICD-10-CM

## 2016-12-17 DIAGNOSIS — M1711 Unilateral primary osteoarthritis, right knee: Secondary | ICD-10-CM | POA: Diagnosis not present

## 2016-12-17 DIAGNOSIS — G8929 Other chronic pain: Secondary | ICD-10-CM

## 2016-12-17 MED ORDER — KETOROLAC TROMETHAMINE 30 MG/ML IJ SOLN
15.0000 mg | Freq: Once | INTRAMUSCULAR | Status: AC
  Administered 2016-12-17: 15 mg via INTRAMUSCULAR

## 2016-12-17 MED ORDER — KETOROLAC TROMETHAMINE 30 MG/ML IJ SOLN: 30.0000 mg | Freq: Once | INTRAMUSCULAR | Status: DC

## 2016-12-17 NOTE — Progress Notes (Signed)
Knee injection Right   Indication: Right Knee pain not relieved by medication management and other conservative care.  Informed consent was obtained after describing risks and benefits of the procedure with the patient, this includes bleeding, bruising, infection and medication side effects. The patient wishes to proceed and has given written consent. The patient was placed in a recumbent position. The medial aspect of the knee was marked and prepped with Betadine and alcohol. It was then entered with a 25-gauge 1-1/2 inch needlea solution containing one ML of 6mg  per mL betamethasone and 3 mL of 1% lidocaine were injected. The patient tolerated the procedure well. Post procedure instructions were given.  Patient also would like to reschedule lumbar medial branch blocks due to gradually increasing low back pain  Given patient is taking Tivicay a will reduced Toradol to 15 mg IM one time dose for her acute exacerbation of chronic low back pain

## 2016-12-18 MED FILL — DESCOVY 200-25 MG TABS: 200-25 | 30 days supply | Qty: 30 | Fill #2

## 2016-12-18 MED FILL — TIVICAY 50 MG TABLET: 50 | 30 days supply | Qty: 30 | Fill #2

## 2016-12-24 ENCOUNTER — Ambulatory Visit (HOSPITAL_BASED_OUTPATIENT_CLINIC_OR_DEPARTMENT_OTHER): Payer: Medicare HMO | Admitting: Physical Medicine & Rehabilitation

## 2016-12-24 ENCOUNTER — Encounter: Payer: Self-pay | Admitting: Physical Medicine & Rehabilitation

## 2016-12-24 VITALS — BP 154/95 | HR 55

## 2016-12-24 DIAGNOSIS — M47817 Spondylosis without myelopathy or radiculopathy, lumbosacral region: Secondary | ICD-10-CM | POA: Diagnosis not present

## 2016-12-24 DIAGNOSIS — M25561 Pain in right knee: Secondary | ICD-10-CM | POA: Diagnosis not present

## 2016-12-24 DIAGNOSIS — G8929 Other chronic pain: Secondary | ICD-10-CM | POA: Diagnosis not present

## 2016-12-24 DIAGNOSIS — M545 Low back pain: Secondary | ICD-10-CM | POA: Diagnosis not present

## 2016-12-24 NOTE — Progress Notes (Signed)
  PROCEDURE RECORD [facility]   Name: Courtney Hamilton DOB:1961-02-03 MRN: [id number]  Date:12/24/2016  Physician: Alysia Penna, MD    Nurse/CMA: [place] CMA  Allergies:  Allergies  Allergen Reactions  . Baclofen Other (See Comments)    Perioral paresthesia  . Bactrim [Sulfamethoxazole-Trimethoprim] Rash  . Doxycycline Rash    Consent Signed: Yes.    Is patient diabetic? No.  CBG today?  Pregnant: No. LMP: No LMP recorded. Patient is postmenopausal. (age 67-55)  Anticoagulants: no Anti-inflammatory: no Antibiotics: no  Procedure: Bilateral L 3-4-5 [facility] [place] Position: Prone Start Time: 1:05pm      End Time: 1:18pm  Fluoro Time: 35  RN/CMA [place] Psychologist, counselling CMA    Time 12:43 1:22pm    BP 154/95 179/107    Pulse 55 62    Respirations 14 14    O2 Sat 98 98    S/S 6 6    Pain Level 10/10 2/10     D/C home with husband, patient A & O X 3, D/C instructions reviewed, and sits independently.

## 2016-12-24 NOTE — Progress Notes (Signed)
Bilateral Lumbar L3, L4  medial branch blocks and L 5 dorsal ramus injection under fluoroscopic guidance   Indication: Lumbar pain which is not relieved by medication management or other conservative care and interfering with self-care and mobility.(Hx of HIV, 12/04/2016 HIV RNA nondetectable)  Informed consent was obtained after describing risks and benefits of the procedure with the patient, this includes bleeding, infection, paralysis and medication side effects.  The patient wishes to proceed and has given written consent.  The patient was placed in prone position.  The lumbar area was marked and prepped with Betadine.  One mL of 1% lidocaine was injected into each of 6 areas into the skin and subcutaneous tissue.  Then a 22-gauge 3.5in spinal needle was inserted targeting the junction of the left S1 superior articular process and sacral ala junction. Needle was advanced under fluoroscopic guidance.  Bone contact was made.  Isovue 200 was injected x 0.5 mL demonstrating no intravascular uptake.  Then a solution  of 2% MPF lidocaine was injected x 0.5 mL.  Then the left L5 superior articular process in transverse process junction was targeted.  Bone contact was made.  Isovue 200 was injected x 0.5 mL demonstrating no intravascular uptake. Then a solution containing  2% MPF lidocaine was injected x 0.5 mL.  Then the left L4 superior articular process in transverse process junction was targeted.  Bone contact was made.  Isovue 200 was injected x 0.5 mL demonstrating no intravascular uptake.  Then a solution containing2% MPF lidocaine was injected x 0.5 mL.  This same procedure was performed on the right side using the same needle, technique and injectate.  Patient tolerated procedure well.  Post procedure instructions were given.

## 2016-12-24 NOTE — Patient Instructions (Signed)

## 2017-01-13 ENCOUNTER — Encounter: Payer: Self-pay | Admitting: Student

## 2017-01-13 ENCOUNTER — Ambulatory Visit (INDEPENDENT_AMBULATORY_CARE_PROVIDER_SITE_OTHER): Payer: Medicare HMO | Admitting: Student

## 2017-01-13 ENCOUNTER — Other Ambulatory Visit: Payer: Self-pay

## 2017-01-13 VITALS — BP 148/90 | HR 64 | Temp 98.3°F | Ht 66.0 in | Wt 173.8 lb

## 2017-01-13 DIAGNOSIS — M546 Pain in thoracic spine: Secondary | ICD-10-CM | POA: Diagnosis not present

## 2017-01-13 MED ORDER — CYCLOBENZAPRINE HCL 10 MG PO TABS: ORAL_TABLET | ORAL | 0 refills | Status: DC

## 2017-01-13 NOTE — Patient Instructions (Addendum)
It was great seeing you today! We have addressed the following issues today  Back pain: I have sent a prescription for Flexeril to your pharmacy.  This medicine might make you sleepy or drowsy.  Please be cautious if you are driving or doing something that needs mental focus.  We have also ordered an x-ray of your back.  You can have this done at [facility] on [address].    If we did any lab work today, and the results require attention, either me or my nurse will get in touch with you. If everything is normal, you will get a letter in mail and a message via . If you don't hear from [place] in two weeks, please give [place] a call. Otherwise, we look forward to seeing you again at your next visit. If you have any questions or concerns before then, please call the clinic at [phone].  Please bring all your medications to every doctors visit  Sign up for My Chart to have easy access to your labs results, and communication with your Primary care physician.    Please check-out at the front desk before leaving the clinic.    Take Care,   Dr. Cyndia Skeeters

## 2017-01-13 NOTE — Progress Notes (Signed)
Subjective:    Courtney Hamilton is a 56 y.o. old female here for back pain  HPI Upper back pain: this has been going on for one week. No inciting factor. She has history of low back pain but not in her upper back. She describes her pain as spasm and sharp.  Pain is mainly in the middle. No radiation. Pain is about the same since started.  Pain is worse with movement.  She tried ice which helped a little bit.  She also tried heat but could not tolerate due to her hot flash. She had nerve block for her low back pain at orthopedic office two weeks ago.  Of note, she thinks she is stressed and her stress is manifesting as pain.   Patient denies fever, chills, urinary retention, bowel or bladder issue, saddle anesthesia, nausea or vomiting, unintentional weight loss or night sweats.  Significant past medical history: History of HIV on medication.  Last CD4 count 480 about 6 weeks ago.  Last HIV RNA undetectable about 6 weeks ago.   PMH/Problem List: has Back pain; HIV positive ([place]); Sleep disorder; Osteoporosis; Recurrent UTI; History of tuberculosis exposure; Herpes simplex; Peripheral neuropathy; Hypercholesteremia; Lower leg edema; Reflux; Lower GI bleed; Lumbar paraspinal muscle spasm; Osteoarthritis of CMC joint of thumb; Lumbosacral spondylosis without myelopathy; Panic attacks; Anxiety state; Ready to quit smoking; Generalized anxiety disorder; Lower abdominal pain; Fall; Muscle strain; Chronic low back pain without sciatica; Head injury; Insomnia; Smoking; Flu-like symptoms; Upper respiratory infection, viral; and Sore throat on their problem list.   has a past medical history of Herniated disc (10/07/2011), HIV (human immunodeficiency virus infection) ([place]), Osteoporosis, Ruptured lumbar disc, Sciatica, Substance abuse ([place]), and TB (pulmonary tuberculosis) (1993 ).  FH:  Family History  Problem Relation Age of Onset  . Asthma Father   . Diabetes Brother   . Hypertension Brother     [facility] Social  History   Tobacco Use  . Smoking status: Former Smoker    Packs/day: 0.50    Years: 30.00    Pack years: 15.00    Types: Cigarettes  . Smokeless tobacco: Never Used  . Tobacco comment: is trying to quit at this time  Substance Use Topics  . Alcohol use: No  . Drug use: No    Comment: past history of cocaine  and alcohol      Review of Systems Review of systems negative except for pertinent positives and negatives in history of present illness above.     Objective:     Vitals:   01/13/17 1353  BP: (!) 148/90  Pulse: 64  Temp: 98.3 F (36.8 C)  TempSrc: Oral  SpO2: 98%  Weight: 173 lb 12.8 oz (78.8 kg)  Height: 5\' 6"  (1.676 m)   Body mass index is 28.05 kg/m.  Physical Exam GENERAL: appears well, no ditress NECK: Full range of motion LUNGS:  No IWOB HEART:  RRR  ABD: soft, NT with active BS MSK: No notable swelling overlying skin lesion in her back.  Tenderness to palpation over upper thoracic spines at the level of the spinous of scapula.  Limited flexion in her back partly due to pain.   NEURO: Motor 5/5 with flexion and extension of his hips, knees and ankles.  Light sensation intact in all dermatomes.  Patellar reflex 2+ bilaterally. PSYCH: normal affect    Assessment and Plan:  1. Acute midline thoracic back pain: Unclear etiology.  She has no red flags for infectious process or malignancy.  However,  history of HIV and recent nerve block about 2 weeks ago is worrisome.  She is also tender to palpation over her upper thoracic spines.  So will obtain 2 view thoracic spine  x-ray to exclude fracture.  Gave prescription for Flexeril.  Discussed return precautions.  - cyclobenzaprine (FLEXERIL) 10 MG tablet; Start with one tablet at bedtime. You may take up to every 8 hours if no improvement.  Dispense: 20 tablet; Refill: 0 - DG Thoracic Spine 2 View; Future  Return if symptoms worsen or fail to improve.  Mercy Riding, MD 01/13/17 Pager: [phone]

## 2017-01-20 MED FILL — TIVICAY 50 MG TABLET: 50 | 30 days supply | Qty: 30 | Fill #3

## 2017-01-20 MED FILL — DESCOVY 200-25 MG TABS: 200-25 | 30 days supply | Qty: 30 | Fill #3

## 2017-02-11 ENCOUNTER — Other Ambulatory Visit: Payer: Self-pay

## 2017-02-11 NOTE — Telephone Encounter (Signed)
OK to refill 88month supply NP or MD visit 1 month make sure we have CSA

## 2017-02-11 NOTE — Telephone Encounter (Signed)
Patient called requesting refill of tramadol medication, no mention of continuing this medication since originally ordered in June 2018 with 5 refills,   Is it ok to refill this medication, please advise

## 2017-02-16 MED ORDER — TRAMADOL HCL 50 MG PO TABS: 100.0000 mg | ORAL_TABLET | Freq: Three times a day (TID) | ORAL | 0 refills | Status: DC | PRN

## 2017-02-16 NOTE — Telephone Encounter (Signed)
Called to pharmacy and notified Ms Rahn.

## 2017-02-19 ENCOUNTER — Other Ambulatory Visit: Payer: Self-pay | Admitting: Pharmacist

## 2017-02-23 MED FILL — TIVICAY 50 MG TABLET: 50 | 30 days supply | Qty: 30 | Fill #4

## 2017-02-23 MED FILL — DESCOVY 200-25 MG TABS: 200-25 | 30 days supply | Qty: 30 | Fill #4

## 2017-03-10 ENCOUNTER — Encounter: Payer: Self-pay | Admitting: Family Medicine

## 2017-03-10 ENCOUNTER — Ambulatory Visit (INDEPENDENT_AMBULATORY_CARE_PROVIDER_SITE_OTHER): Payer: Medicare HMO | Admitting: Family Medicine

## 2017-03-10 ENCOUNTER — Other Ambulatory Visit: Payer: Self-pay

## 2017-03-10 VITALS — BP 124/76 | HR 87 | Temp 98.8°F | Ht 66.0 in | Wt 178.0 lb

## 2017-03-10 DIAGNOSIS — J208 Acute bronchitis due to other specified organisms: Secondary | ICD-10-CM

## 2017-03-10 DIAGNOSIS — B9689 Other specified bacterial agents as the cause of diseases classified elsewhere: Secondary | ICD-10-CM | POA: Diagnosis not present

## 2017-03-10 DIAGNOSIS — J329 Chronic sinusitis, unspecified: Secondary | ICD-10-CM

## 2017-03-10 DIAGNOSIS — J189 Pneumonia, unspecified organism: Secondary | ICD-10-CM | POA: Diagnosis not present

## 2017-03-10 MED ORDER — HYDROCOD POLST-CPM POLST ER 10-8 MG/5ML PO SUER: 5.0000 mL | Freq: Two times a day (BID) | ORAL | 0 refills | Status: DC | PRN

## 2017-03-10 MED ORDER — AZITHROMYCIN 250 MG PO TABS: ORAL_TABLET | ORAL | 0 refills | Status: DC

## 2017-03-10 NOTE — Patient Instructions (Signed)

## 2017-03-10 NOTE — Progress Notes (Signed)
Subjective:     Patient ID: Courtney Hamilton, female   DOB: [birth year], 57 y.o.   MRN: [id number]  Cough  This is a recurrent (Started about 1 month ago, she got better and now worsened in the last 1 week) problem. The current episode started 1 to 4 weeks ago. The problem has been gradually worsening. The problem occurs constantly. The cough is productive of sputum (thickened yellowish sputum from coughing and greenish nasal discharge from nasal congestion). Associated symptoms include headaches, nasal congestion, postnasal drip and rhinorrhea. Pertinent negatives include no chest pain, ear congestion, ear pain, fever, shortness of breath or wheezing. Nothing aggravates the symptoms. She has tried OTC cough suppressant (Robitussin) for the symptoms. The treatment provided no relief. There is no history of asthma or COPD.   Current Outpatient Medications on File Prior to Visit  Medication Sig Dispense Refill  . Biotin 5000 MCG CAPS Take 5,000 mcg by mouth daily.    . Calcium-Magnesium-Zinc 167-83-8 MG TABS Take 1 tablet by mouth daily.    . clotrimazole (MYCELEX) 10 MG troche Take 10 mg by mouth as needed (thrush). Reported on 05/28/2015    . Cyanocobalamin 1000 MCG SUBL Place 1,000 mcg under the tongue daily.     . cyclobenzaprine (FLEXERIL) 10 MG tablet Start with one tablet at bedtime. You may take up to every 8 hours if no improvement. 20 tablet 0  . DESCOVY 200-25 MG tablet TAKE 1 TABLET BY MOUTH DAILY. 90 tablet 2  . nystatin (MYCOSTATIN) 100000 UNIT/ML suspension Take 10 mLs (1,000,000 Units total) by mouth 2 (two) times daily. 120 mL 0  . TIVICAY 50 MG tablet TAKE 1 TABLET (50 MG TOTAL) BY MOUTH DAILY. 90 tablet 2  . traMADol (ULTRAM) 50 MG tablet Take 2 tablets (100 mg total) by mouth 3 (three) times daily as needed. 180 tablet 0  . vitamin C (ASCORBIC ACID) 500 MG tablet Take 500 mg by mouth daily.    . vitamin E 400 UNIT capsule Take 400 Units by mouth daily.    Marland Kitchen zolpidem (AMBIEN) 10 MG  tablet Take 0.5 tablets (5 mg total) by mouth at bedtime as needed for sleep. Take one half of 10 mg tablet. 15 tablet 1   No current facility-administered medications on file prior to visit.    Past Medical History:  Diagnosis Date  . Herniated disc 10/07/2011  . HIV (human immunodeficiency virus infection) ([place])   . Osteoporosis   . Ruptured lumbar disc   . Sciatica   . Substance abuse ([place])    past hsitory  clean more than 20 years   . TB (pulmonary tuberculosis) 1993    exposure, treated    Vitals:   03/10/17 1439  BP: 124/76  Pulse: 87  Temp: 98.8 F (37.1 C)  TempSrc: Oral  SpO2: 98%  Weight: 178 lb (80.7 kg)  Height: 5\' 6"  (1.676 m)     Review of Systems  Constitutional: Negative for fever.  HENT: Positive for postnasal drip and rhinorrhea. Negative for ear pain.   Respiratory: Positive for cough. Negative for shortness of breath and wheezing.   Cardiovascular: Negative for chest pain.  Neurological: Positive for headaches.  All other systems reviewed and are negative.      Objective:   Physical Exam  Constitutional: She appears well-developed. No distress.  HENT:  Head: Normocephalic.  Right Ear: Tympanic membrane normal.  Left Ear: Tympanic membrane normal.  Mouth/Throat: Uvula is midline, oropharynx is clear and moist and mucous  membranes are normal.  Neck: Neck supple.  Cardiovascular: Normal rate, regular rhythm and normal heart sounds.  No murmur heard. Pulmonary/Chest: Effort normal and breath sounds normal. No respiratory distress. She has no wheezes.  Lymphadenopathy:    She has no cervical adenopathy.  Nursing note and vitals reviewed.      Assessment:     Chronic recurrent cough ?? Atypical pneumonia/Bacterial bronchitis Bacterial sinusitis    Plan:     Given hx of HIV and symptoms has been ongoing for about a month I am more inclined to treating with A/B. Zpak prescribed. Tussionex prn cough prescribed. F/U instruction discussed.

## 2017-03-15 ENCOUNTER — Encounter: Payer: Self-pay | Admitting: Physical Medicine & Rehabilitation

## 2017-03-15 ENCOUNTER — Ambulatory Visit (HOSPITAL_BASED_OUTPATIENT_CLINIC_OR_DEPARTMENT_OTHER): Payer: Medicare HMO | Admitting: Physical Medicine & Rehabilitation

## 2017-03-15 ENCOUNTER — Encounter: Payer: Medicare HMO | Attending: Physical Medicine & Rehabilitation

## 2017-03-15 VITALS — BP 115/82 | HR 58 | Resp 14

## 2017-03-15 DIAGNOSIS — M47817 Spondylosis without myelopathy or radiculopathy, lumbosacral region: Secondary | ICD-10-CM

## 2017-03-15 DIAGNOSIS — M7061 Trochanteric bursitis, right hip: Secondary | ICD-10-CM | POA: Diagnosis not present

## 2017-03-15 DIAGNOSIS — G8929 Other chronic pain: Secondary | ICD-10-CM | POA: Diagnosis not present

## 2017-03-15 DIAGNOSIS — M25561 Pain in right knee: Secondary | ICD-10-CM | POA: Diagnosis not present

## 2017-03-15 DIAGNOSIS — M545 Low back pain: Secondary | ICD-10-CM | POA: Diagnosis not present

## 2017-03-15 MED ORDER — TRAMADOL HCL 50 MG PO TABS: 100.0000 mg | ORAL_TABLET | Freq: Three times a day (TID) | ORAL | 5 refills | Status: DC | PRN

## 2017-03-15 NOTE — Patient Instructions (Signed)
Please call for injection appt

## 2017-03-15 NOTE — Progress Notes (Signed)
Subjective:    Patient ID: Courtney Hamilton, female    DOB: 1960/04/16, 57 y.o.   MRN: [id number]  HPI 12/24/2016 B L3-4-5 MBB  Lumbar pain has been relieved since her medial branch blocks.  She has had a slight worsening over time but not back to her baseline.  Right LE burning pain today but hasn't had if for >1 mo. her pain starts in her hip area and goes down the lateral thigh to the lateral knee area.  She does not have any numbness or tingling or weakness in that area. Pain Inventory Average Pain 7 Pain Right Now 9 My pain is constant, sharp and stabbing  In the last 24 hours, has pain interfered with the following? General activity 5 Relation with others 1 Enjoyment of life 3 What TIME of day is your pain at its worst? evening Sleep (in general) Fair  Pain is worse with: walking, standing and some activites Pain improves with: rest, heat/ice and medication Relief from Meds: 5  Mobility walk without assistance Do you have any goals in this area?  no  Function disabled: date disabled . Do you have any goals in this area?  no  Neuro/Psych No problems in this area  Prior Studies Any changes since last visit?  no  Physicians involved in your care Any changes since last visit?  no   Family History  Problem Relation Age of Onset  . Asthma Father   . Diabetes Brother   . Hypertension Brother    Social History   Socioeconomic History  . Marital status: Single    Spouse name: None  . Number of children: None  . Years of education: None  . Highest education level: None  Social Needs  . Financial resource strain: None  . Food insecurity - worry: None  . Food insecurity - inability: None  . Transportation needs - medical: None  . Transportation needs - non-medical: None  Occupational History  . None  Tobacco Use  . Smoking status: Former Smoker    Packs/day: 0.50    Years: 30.00    Pack years: 15.00    Types: Cigarettes  . Smokeless tobacco: Never  Used  . Tobacco comment: is trying to quit at this time  Substance and Sexual Activity  . Alcohol use: No  . Drug use: No    Comment: past history of cocaine  and alcohol    . Sexual activity: No    Partners: Male    Comment: declined condoms   Other Topics Concern  . None  Social History Narrative  . None   Past Surgical History:  Procedure Laterality Date  . APPENDECTOMY    . CHOLECYSTECTOMY    . ECTOPIC PREGNANCY SURGERY    . SALIVARY GLAND SURGERY     Past Medical History:  Diagnosis Date  . Herniated disc 10/07/2011  . HIV (human immunodeficiency virus infection) ([place])   . Osteoporosis   . Ruptured lumbar disc   . Sciatica   . Substance abuse ([place])    past hsitory  clean more than 20 years   . TB (pulmonary tuberculosis) 1993    exposure, treated    BP 115/82 (BP Location: Right Arm, Patient Position: Sitting, Cuff Size: Normal)   Pulse (!) 58   Resp 14   SpO2 96%   Opioid Risk Score:   Fall Risk Score:  `1  Depression screen [facility] 2/9  Depression screen [facility] 2/9 01/13/2017 12/24/2016 11/23/2016 11/19/2016 10/13/2016 07/21/2016 06/01/2016  Decreased Interest 0 0 0 0 0 0 0  Down, Depressed, Hopeless 0 0 - 0 0 0 0  PHQ - 2 Score 0 0 0 0 0 0 0  Altered sleeping - - - - - - -  Tired, decreased energy - - - - - - -  Change in appetite - - - - - - -  Feeling bad or failure about yourself  - - - - - - -  Trouble concentrating - - - - - - -  Moving slowly or fidgety/restless - - - - - - -  Suicidal thoughts - - - - - - -  PHQ-9 Score - - - - - - -  Some recent data might be hidden    Review of Systems  Constitutional: Negative.   HENT: Negative.   Eyes: Negative.   Respiratory: Negative.   Cardiovascular: Negative.   Gastrointestinal: Negative.   Endocrine: Negative.   Genitourinary: Negative.   Musculoskeletal: Positive for back pain.  Skin: Negative.   Allergic/Immunologic: Negative.   Neurological: Negative.   Hematological: Negative.     Psychiatric/Behavioral: Negative.        Objective:   Physical Exam  Constitutional: She is oriented to person, place, and time. She appears well-developed and well-nourished. No distress.  HENT:  Head: Normocephalic and atraumatic.  Eyes: Conjunctivae and EOM are normal. Pupils are equal, round, and reactive to light.  Musculoskeletal:  There is mild posterior hip and buttock pain with right hip internal/external rotation no groin pain. Lumbar spine has full flexion extension is accompanied by pain and is limited to 50% of normal.  Lateral bending is also approximately 50% of normal.  Neurological: She is alert and oriented to person, place, and time. She displays no tremor. She exhibits normal muscle tone. Coordination and gait normal.  Skin: She is not diaphoretic.  Psychiatric: She has a normal mood and affect.  Nursing note and vitals reviewed.   There is tenderness to palpation along the right greater trochanter of the hip as well as along the right iliotibial band.  Negative straight leg raising test Motor strength is 5/5 bilateral hip flexors knee extensors ankle dorsiflexors.       Assessment & Plan:  #1.  Lumbar spondylosis without myelopathy she gets a prolonged relief with lumbar medial branch blocks L3 and 4 as well as L5 dorsal ramus injections under fluoroscopic guidance.  Her last injection was in October.  We discussed that it is difficult to assess the duration other than return of pain.  She will call to schedule another set of injections once she has noted increasing lumbar pain. She has follow-ups with infectious disease to monitor her HIV, there is been no antibiotic detectable over the last several visits. We will continue tramadol 100 mg 3 times daily Reviewed PMP aware website no evidence of multiple prescribers, opioid risk/overdose score shows no elevated risk

## 2017-03-17 ENCOUNTER — Other Ambulatory Visit: Payer: Self-pay | Admitting: Internal Medicine

## 2017-03-17 DIAGNOSIS — Z1231 Encounter for screening mammogram for malignant neoplasm of breast: Secondary | ICD-10-CM

## 2017-03-22 MED FILL — DESCOVY 200-25 MG TABS: 200-25 | 30 days supply | Qty: 30 | Fill #5

## 2017-03-22 MED FILL — TIVICAY 50 MG TABLET: 50 | 30 days supply | Qty: 30 | Fill #5

## 2017-04-01 ENCOUNTER — Telehealth: Payer: Self-pay | Admitting: Physical Medicine & Rehabilitation

## 2017-04-01 MED ORDER — GABAPENTIN 300 MG PO CAPS: 300.0000 mg | ORAL_CAPSULE | Freq: Three times a day (TID) | ORAL | 1 refills | Status: DC

## 2017-04-01 NOTE — Telephone Encounter (Signed)
Pt phoned to schedule injections. Pt has requested Gabapentin for pain until apt on 02/08. Stating pain in legs as well. Please advise.

## 2017-04-07 ENCOUNTER — Ambulatory Visit
Admission: RE | Admit: 2017-04-07 | Discharge: 2017-04-07 | Disposition: A | Discharge status: Home / Self Care | Payer: Medicare HMO | Source: Ambulatory Visit | Attending: Internal Medicine | Admitting: Internal Medicine

## 2017-04-07 DIAGNOSIS — Z1231 Encounter for screening mammogram for malignant neoplasm of breast: Secondary | ICD-10-CM | POA: Diagnosis not present

## 2017-04-16 ENCOUNTER — Encounter: Payer: Medicare HMO | Attending: Physical Medicine & Rehabilitation

## 2017-04-16 ENCOUNTER — Encounter: Payer: Self-pay | Admitting: Physical Medicine & Rehabilitation

## 2017-04-16 ENCOUNTER — Ambulatory Visit (HOSPITAL_BASED_OUTPATIENT_CLINIC_OR_DEPARTMENT_OTHER): Payer: Medicare HMO | Admitting: Physical Medicine & Rehabilitation

## 2017-04-16 VITALS — BP 161/99 | HR 53 | Resp 14

## 2017-04-16 DIAGNOSIS — M25561 Pain in right knee: Secondary | ICD-10-CM | POA: Insufficient documentation

## 2017-04-16 DIAGNOSIS — M545 Low back pain: Secondary | ICD-10-CM | POA: Diagnosis present

## 2017-04-16 DIAGNOSIS — M47816 Spondylosis without myelopathy or radiculopathy, lumbar region: Secondary | ICD-10-CM | POA: Diagnosis not present

## 2017-04-16 DIAGNOSIS — G894 Chronic pain syndrome: Secondary | ICD-10-CM | POA: Diagnosis not present

## 2017-04-16 DIAGNOSIS — G8929 Other chronic pain: Secondary | ICD-10-CM | POA: Diagnosis present

## 2017-04-16 DIAGNOSIS — Z79899 Other long term (current) drug therapy: Secondary | ICD-10-CM

## 2017-04-16 DIAGNOSIS — Z5181 Encounter for therapeutic drug level monitoring: Secondary | ICD-10-CM

## 2017-04-16 DIAGNOSIS — M47817 Spondylosis without myelopathy or radiculopathy, lumbosacral region: Secondary | ICD-10-CM

## 2017-04-16 NOTE — Patient Instructions (Signed)
Lumbar medial branch blocks were performed. This is to help diagnose the cause of the low back pain. It is important that you keep track of your pain for the first day or 2 after injection. This injection can give you temporary relief that lasts for hours or up to several months. There is no way to predict duration of pain relief.  Please try to compare your pain after injection to for the injection.  If this injection gives you  temporary relief there may be another longer-lasting procedure that may be beneficial called radiofrequency ablation  Please call in 75mo if you need another Medial branch block

## 2017-04-16 NOTE — Progress Notes (Signed)
Bilateral Lumbar L3, L4  medial branch blocks and L 5 dorsal ramus injection under fluoroscopic guidance   Indication: Lumbar pain which is not relieved by medication management or other conservative care and interfering with self-care and mobility.(Hx of HIV, 12/04/2016 HIV RNA nondetectable)  Informed consent was obtained after describing risks and benefits of the procedure with the patient, this includes bleeding, infection, paralysis and medication side effects.  The patient wishes to proceed and has given written consent.  The patient was placed in prone position.  The lumbar area was marked and prepped with Betadine.  One mL of 1% lidocaine was injected into each of 6 areas into the skin and subcutaneous tissue.  Then a 22-gauge 3.5in spinal needle was inserted targeting the junction of the left S1 superior articular process and sacral ala junction. Needle was advanced under fluoroscopic guidance.  Bone contact was made.  Isovue 200 was injected x 0.5 mL demonstrating no intravascular uptake.  Then a solution  of 2% MPF lidocaine was injected x 0.5 mL.  Then the left L5 superior articular process in transverse process junction was targeted.  Bone contact was made.  Isovue 200 was injected x 0.5 mL demonstrating no intravascular uptake. Then a solution containing  2% MPF lidocaine was injected x 0.5 mL.  Then the left L4 superior articular process in transverse process junction was targeted.  Bone contact was made.  Isovue 200 was injected x 0.5 mL demonstrating no intravascular uptake.  Then a solution containing2% MPF lidocaine was injected x 0.5 mL.  This same procedure was performed on the right side using the same needle, technique and injectate.  Patient tolerated procedure well.  Post procedure instructions were given.

## 2017-04-16 NOTE — Progress Notes (Signed)
PROCEDURE RECORD [facility]   Name: Shakinah Becvar DOB:Jun 09, 1960 MRN: [id number]  Date:04/16/2017  Physician: Claudette Laws, MD    Nurse/CMA: Domnic Vantol, CMA Allergies:  Allergies  Allergen Reactions  . Baclofen Other (See Comments)    Perioral paresthesia  . Bactrim [Sulfamethoxazole-Trimethoprim] Rash  . Doxycycline Rash    Consent Signed: Yes.    Is patient diabetic? No.  CBG today?   Pregnant: No. LMP: No LMP recorded. Patient is postmenopausal. (age 25-55)  Anticoagulants: no Anti-inflammatory: no Antibiotics: no  Procedure: bilateral L3,4,5 medial branch block  Position: Prone Start Time: 11:50am      End Time: 12:05 pm   Fluoro Time:1:08s   RN/CMA Daud Cayer, CMA Taunja Brickner, CMA    Time 11:40am 12:08pm    BP 161/99 175/104    Pulse 53 54    Respirations 14 14    O2 Sat 98 99    S/S 6 6    Pain Level 7/10 3/10     D/C home with father , patient A & O X 3, D/C instructions reviewed, and sits independently.

## 2017-04-20 MED FILL — DESCOVY 200-25 MG TABS: 200-25 | 30 days supply | Qty: 30 | Fill #6

## 2017-04-20 MED FILL — TIVICAY 50 MG TABLET: 50 | 30 days supply | Qty: 30 | Fill #6

## 2017-05-12 ENCOUNTER — Other Ambulatory Visit: Payer: Self-pay | Admitting: Family Medicine

## 2017-05-12 DIAGNOSIS — F17200 Nicotine dependence, unspecified, uncomplicated: Secondary | ICD-10-CM

## 2017-05-14 ENCOUNTER — Other Ambulatory Visit: Payer: Self-pay | Admitting: Pharmacist Clinician (PhC)/ Clinical Pharmacy Specialist

## 2017-05-14 DIAGNOSIS — B20 Human immunodeficiency virus [HIV] disease: Secondary | ICD-10-CM

## 2017-05-17 ENCOUNTER — Other Ambulatory Visit: Payer: Medicare HMO

## 2017-05-18 ENCOUNTER — Other Ambulatory Visit: Payer: Medicare HMO

## 2017-05-18 DIAGNOSIS — R69 Illness, unspecified: Secondary | ICD-10-CM | POA: Diagnosis not present

## 2017-05-18 DIAGNOSIS — B20 Human immunodeficiency virus [HIV] disease: Secondary | ICD-10-CM

## 2017-05-19 LAB — BASIC METABOLIC PANEL
BUN: 12 mg/dL (ref 7–25)
CHLORIDE: 103 mmol/L (ref 98–110)
CO2: 23 mmol/L (ref 20–32)
CREATININE: 0.97 mg/dL (ref 0.50–1.05)
Calcium: 9.5 mg/dL (ref 8.6–10.4)
GLUCOSE: 114 mg/dL — AB (ref 65–99)
Potassium: 4 mmol/L (ref 3.5–5.3)
Sodium: 139 mmol/L (ref 135–146)

## 2017-05-19 LAB — LIPID PANEL
CHOLESTEROL: 308 mg/dL — AB (ref <200)
HDL: 70 mg/dL (ref >50)
LDL Cholesterol (Calc): 206 mg/dL (calc) — ABNORMAL HIGH
NON-HDL CHOLESTEROL (CALC): 238 mg/dL — AB (ref <130)
TRIGLYCERIDES: 158 mg/dL — AB (ref <150)
Total CHOL/HDL Ratio: 4.4 (calc) (ref <5.0)

## 2017-05-19 LAB — RPR: RPR Ser Ql: NONREACTIVE

## 2017-05-19 LAB — HEPATITIS A ANTIBODY, TOTAL: HEPATITIS A AB,TOTAL: NONREACTIVE

## 2017-05-19 LAB — HEPATITIS B SURFACE ANTIBODY,QUALITATIVE: HEP B S AB: REACTIVE — AB

## 2017-05-20 LAB — HIV-1 RNA QUANT-NO REFLEX-BLD
HIV 1 RNA QUANT: NOT DETECTED {copies}/mL
HIV-1 RNA QUANT, LOG: NOT DETECTED {Log_copies}/mL

## 2017-05-24 ENCOUNTER — Other Ambulatory Visit: Payer: Self-pay | Admitting: Physical Medicine & Rehabilitation

## 2017-05-24 ENCOUNTER — Ambulatory Visit: Payer: Medicare HMO | Admitting: Internal Medicine

## 2017-05-24 NOTE — Telephone Encounter (Signed)
Recieved electronic medication refill request for gabapentin.  On 04-01-2017 there was a telephone message that stated this:  Pt phoned to schedule injections. Pt has requested Gabapentin for pain until apt on 02/08. Stating pain in legs as well. Please advise. meds were ordered with 1 refill  No mention of medication on notes after that date.  Unsure if ok to refill this medication.  please advise.

## 2017-05-26 MED FILL — DESCOVY 200-25 MG TABS: 200-25 | 30 days supply | Qty: 30 | Fill #7

## 2017-05-26 MED FILL — TIVICAY 50 MG TABLET: 50 | 30 days supply | Qty: 30 | Fill #7

## 2017-05-31 ENCOUNTER — Encounter: Payer: Self-pay | Admitting: Internal Medicine

## 2017-05-31 ENCOUNTER — Ambulatory Visit (INDEPENDENT_AMBULATORY_CARE_PROVIDER_SITE_OTHER): Payer: Medicare HMO | Admitting: Internal Medicine

## 2017-05-31 VITALS — BP 143/84 | HR 62 | Temp 98.0°F | Wt 180.0 lb

## 2017-05-31 DIAGNOSIS — F418 Other specified anxiety disorders: Secondary | ICD-10-CM

## 2017-05-31 DIAGNOSIS — R69 Illness, unspecified: Secondary | ICD-10-CM | POA: Diagnosis not present

## 2017-05-31 DIAGNOSIS — M150 Primary generalized (osteo)arthritis: Secondary | ICD-10-CM | POA: Diagnosis not present

## 2017-05-31 DIAGNOSIS — B20 Human immunodeficiency virus [HIV] disease: Secondary | ICD-10-CM | POA: Diagnosis not present

## 2017-05-31 DIAGNOSIS — M159 Polyosteoarthritis, unspecified: Secondary | ICD-10-CM

## 2017-05-31 NOTE — Progress Notes (Signed)
RFV :follow up for hiv disease Patient ID: Courtney Hamilton, female   DOB: 10-24-1960, 57 y.o.   MRN: [id number]  HPI Courtney Hamilton is a 57yo F with well controlled hiv disease, CD 4 count of 480/VL<20, tivicay,descovy. She reports good adherence to her medication. Still has occasional arthritis mostly to shoulders and back followed by dr Letta Pate. She states that she notices improvement of sciatica like pain when she takes neurotin but takes at as an as needed basis. Has remained healthy during winter season, no flu.  She is tearful during this visits stating that she is having family stressors, noticing that her "nerves" are on edge  Outpatient Encounter Medications as of 05/31/2017  Medication Sig  . Biotin 5000 MCG CAPS Take 5,000 mcg by mouth daily.  . Calcium-Magnesium-Zinc 167-83-8 MG TABS Take 1 tablet by mouth daily.  . clotrimazole (MYCELEX) 10 MG troche Take 10 mg by mouth as needed (thrush). Reported on 05/28/2015  . Cyanocobalamin 1000 MCG SUBL Place 1,000 mcg under the tongue daily.   . DESCOVY 200-25 MG tablet TAKE 1 TABLET BY MOUTH DAILY.  Marland Kitchen gabapentin (NEURONTIN) 300 MG capsule TAKE 1 CAPSULE BY MOUTH THREE TIMES A DAY  . NICOTINE STEP 1 21 MG/24HR patch PLACE 1 PATCH (21 MG TOTAL) ONTO THE SKIN DAILY.  Marland Kitchen nystatin (MYCOSTATIN) 100000 UNIT/ML suspension Take 10 mLs (1,000,000 Units total) by mouth 2 (two) times daily.  Marland Kitchen TIVICAY 50 MG tablet TAKE 1 TABLET (50 MG TOTAL) BY MOUTH DAILY.  . traMADol (ULTRAM) 50 MG tablet Take 2 tablets (100 mg total) by mouth 3 (three) times daily as needed for moderate pain.  . vitamin C (ASCORBIC ACID) 500 MG tablet Take 500 mg by mouth daily.  . vitamin E 400 UNIT capsule Take 400 Units by mouth daily.   No facility-administered encounter medications on file as of 05/31/2017.      Patient Active Problem List   Diagnosis Date Noted  . Sore throat 11/19/2016  . Upper respiratory infection, viral 07/21/2016  . Flu-like symptoms 05/25/2016  .  Head injury 11/05/2015  . Insomnia 11/05/2015  . Smoking 11/05/2015  . Chronic low back pain without sciatica 02/18/2015  . Muscle strain 01/22/2015  . Fall 01/14/2015  . Lower abdominal pain 10/08/2014  . Generalized anxiety disorder 06/04/2014  . Ready to quit smoking 04/06/2014  . Anxiety state 01/07/2014  . Panic attacks 12/22/2013  . Lumbosacral spondylosis without myelopathy 09/29/2013  . Osteoarthritis of [place] joint of thumb 04/18/2013  . Lumbar paraspinal muscle spasm 01/16/2013  . Lower GI bleed 05/10/2012  . Reflux 12/13/2011  . Lower leg edema 12/10/2011  . Herpes simplex 10/07/2011  . Peripheral neuropathy 10/07/2011  . Hypercholesteremia 10/07/2011  . History of tuberculosis exposure 10/01/2011  . Recurrent UTI 08/06/2011  . HIV positive ([place]) 08/03/2011  . Sleep disorder 08/03/2011  . Back pain 07/21/2011  . Osteoporosis 03/09/2008     Health Maintenance Due  Topic Date Due  . PAP SMEAR  02/09/2017  . TETANUS/TDAP  05/22/2017     Review of Systems12 point ros is reviewed, positive pertinents listed in hpi Physical Exam   BP (!) 143/84   Pulse 62   Temp 98 F (36.7 C) (Oral)   Wt 180 lb (81.6 kg)   BMI 29.05 kg/m   Physical Exam  Constitutional:  oriented to person, place, and time. appears well-developed and well-nourished. No distress.  HENT: [place]/AT, PERRLA, no scleral icterus Mouth/Throat: Oropharynx is clear and moist. No oropharyngeal  exudate.  Cardiovascular: Normal rate, regular rhythm and normal heart sounds. Exam reveals no gallop and no friction rub.  No murmur heard.  Pulmonary/Chest: Effort normal and breath sounds normal. No respiratory distress.  has no wheezes.  Neck = supple, no nuchal rigidity Abdominal: Soft. Bowel sounds are normal.  exhibits no distension. There is no tenderness.  Lymphadenopathy: no cervical adenopathy. No axillary adenopathy Neurological: alert and oriented to person, place, and time.  Skin: Skin is warm and  dry. No rash noted. No erythema.  Psychiatric: a normal mood and affect.  behavior is normal.   Lab Results  Component Value Date   CD4TCELL 14 (L) 11/23/2016   Lab Results  Component Value Date   CD4TABS 480 11/23/2016   CD4TABS 530 06/01/2016   CD4TABS 230 (L) 12/25/2015   Lab Results  Component Value Date   HIV1RNAQUANT <20 NOT DETECTED 05/18/2017   Lab Results  Component Value Date   HEPBSAB REACTIVE (A) 05/18/2017   Lab Results  Component Value Date   LABRPR NON-REACTIVE 05/18/2017    CBC Lab Results  Component Value Date   WBC 9.1 11/23/2016   RBC 5.08 11/23/2016   HGB 14.0 11/23/2016   HCT 42.8 11/23/2016   PLT 293 11/23/2016   MCV 84.3 11/23/2016   MCH 27.6 11/23/2016   MCHC 32.7 11/23/2016   RDW 12.3 11/23/2016   LYMPHSABS 3,358 11/23/2016   MONOABS 1,304 (H) 06/01/2016   EOSABS 501 (H) 11/23/2016    BMET Lab Results  Component Value Date   NA 139 05/18/2017   K 4.0 05/18/2017   CL 103 05/18/2017   CO2 23 05/18/2017   GLUCOSE 114 (H) 05/18/2017   BUN 12 05/18/2017   CREATININE 0.97 05/18/2017   CALCIUM 9.5 05/18/2017   GFRNONAA 70 11/23/2016   GFRAA 81 11/23/2016      Assessment and Plan  hiv disease = continue on current regimen. Will plan to do lab work today as part of 6 month labs  Back pain/sciatica = recommend to take neurontin daily. Follow up with Dr Raliegh Ip to see if any improvement  Anxiety = discussed starting SSRI but she feels that her symptoms are acute. I mentioned that one avenue would be discussing her symptoms with in house counselors to assess SSRI need as well. hesistant to giving any benzos since not sure what pain contract she has with her other providers

## 2017-06-15 ENCOUNTER — Other Ambulatory Visit: Payer: Self-pay

## 2017-06-15 ENCOUNTER — Encounter: Payer: Self-pay | Admitting: Family Medicine

## 2017-06-15 ENCOUNTER — Ambulatory Visit (INDEPENDENT_AMBULATORY_CARE_PROVIDER_SITE_OTHER): Payer: Medicare HMO | Admitting: Family Medicine

## 2017-06-15 VITALS — BP 108/64 | HR 66 | Temp 97.8°F | Wt 181.6 lb

## 2017-06-15 DIAGNOSIS — B369 Superficial mycosis, unspecified: Secondary | ICD-10-CM

## 2017-06-15 DIAGNOSIS — G4700 Insomnia, unspecified: Secondary | ICD-10-CM

## 2017-06-15 MED ORDER — NYSTATIN 100000 UNIT/GM EX CREA: 1.0000 "application " | TOPICAL_CREAM | Freq: Two times a day (BID) | CUTANEOUS | 1 refills | Status: DC

## 2017-06-15 NOTE — Patient Instructions (Signed)
It was a pleasure to see you today! Thank you for choosing [facility] for your primary care. Courtney Hamilton was seen for infection of abdomen and sleep concerns. Come back to the clinic if you have anything new to discuss, and go to the emergency room if you have any life threatening symptoms.  Today we dried out the infection in your belly button which appears fungal.  We're prescribing some anti-fungal cream that you can place in your belly button which should help within a week.  Keeping the skin dry is just as important in healing this as the medicine    If we did any lab work today, and the results require attention, either me or my nurse will get in touch with you. If everything is normal, you will get a letter in mail and a message via . If you don't hear from [place] in two weeks, please give [place] a call. Otherwise, we look forward to seeing you again at your next visit. If you have any questions or concerns before then, please call the clinic at [phone].  Please bring all your medications to every doctors visit  Sign up for My Chart to have easy access to your labs results, and communication with your Primary care physician.    Please check-out at the front desk before leaving the clinic.    Best,  Dr. Sherene Sires FAMILY MEDICINE RESIDENT - PGY1 06/15/2017 10:53 AM

## 2017-06-16 ENCOUNTER — Encounter: Payer: Self-pay | Admitting: Family Medicine

## 2017-06-16 DIAGNOSIS — B369 Superficial mycosis, unspecified: Secondary | ICD-10-CM

## 2017-06-16 HISTORY — DX: Superficial mycosis, unspecified: B36.9

## 2017-06-16 NOTE — Assessment & Plan Note (Signed)
In belly button, some thick white accumulation.  No real pain at site and there is minimal skin breakdown when the area is cleared with qtip.  Likely fungal, nystatin cream and instructions to dry area thoroughly after showering/bathing

## 2017-06-16 NOTE — Progress Notes (Signed)
    Subjective:  Courtney Hamilton is a 57 y.o. female who presents to the [facility] today with a chief complaint of itchy  Belly button.   No real pain complaints.  Mainly itchiness, has been happening for ~1wk.   Has happened before and she was given "something" for it.  No bleeding, no abdominal pain, no fevers, no complaints of rashes/infectious contacts/sick contacts.  She has taken no medication/treatment actions for this yet.  Also asking for ambien to sleep.  We discussed sleep hygiene and she says he sleep hygiene is good. Says she wakes up about 1-2 hours after going to sleep and sometimes has trouble  Getting back to sleep.  Is in bed ~10hrs/night, not able to quantify how much of that she is sleeping.  She gets tired during the day sometimes but does not doze off. When asked about apnea symptoms for consideration of sleep study she said husband would be home in 2 wks  Objective:  Physical Exam: BP 108/64   Pulse 66   Temp 97.8 F (36.6 C) (Oral)   Wt 181 lb 9.6 oz (82.4 kg)   SpO2 99%   BMI 29.31 kg/m   Gen: NAD, resting comfortably, overweight CV: RRR with no murmurs appreciated Pulm: NWOB, CTAB with no crackles, wheezes, or rhonchi GI: Normal bowel sounds present. Soft, Nontender, Nondistended. MSK: no edema, cyanosis, or clubbing noted Skin: no erythema around belly button, no real tenderness to palpation, no sensation of abscess accumulation, there is some white fluid/accumulation which when cleared with qtip reveals some minimal skin change consistent with fungal/moisture breakdown. Neuro: grossly normal, moves all extremities Psych: Normal affect and thought content  No results found for this or any previous visit (from the past 72 hour(s)).   Assessment/Plan:  Insomnia Patient complaining of waking up in middle if night.  She also describes being tired during day but not dozing off.  Claims she has good sleep hygiene.  We discussed snoring  But her husband is out of town  for a few weeks and won't be available to observe her for apnea.  Recommended continuing sleep hygiene until husband can observe for apnea to consider sleep study.  Fungal infection of skin of abdomen In belly button, some thick white accumulation.  No real pain at site and there is minimal skin breakdown when the area is cleared with qtip.  Likely fungal, nystatin cream and instructions to dry area thoroughly after showering/bathing   Sherene Sires, [place] - PGY1 06/16/2017 7:29 AM

## 2017-06-16 NOTE — Assessment & Plan Note (Signed)
Patient complaining of waking up in middle if night.  She also describes being tired during day but not dozing off.  Claims she has good sleep hygiene.  We discussed snoring  But her husband is out of town for a few weeks and won't be available to observe her for apnea.  Recommended continuing sleep hygiene until husband can observe for apnea to consider sleep study.

## 2017-06-24 MED FILL — TIVICAY 50 MG TABLET: 50 | 30 days supply | Qty: 30 | Fill #8

## 2017-06-24 MED FILL — DESCOVY 200-25 MG TABS: 200-25 | 30 days supply | Qty: 30 | Fill #8

## 2017-07-13 ENCOUNTER — Telehealth: Payer: Self-pay

## 2017-07-13 ENCOUNTER — Telehealth: Payer: Self-pay | Admitting: *Deleted

## 2017-07-13 DIAGNOSIS — Z8742 Personal history of other diseases of the female genital tract: Secondary | ICD-10-CM

## 2017-07-13 DIAGNOSIS — Z0000 Encounter for general adult medical examination without abnormal findings: Secondary | ICD-10-CM

## 2017-07-13 DIAGNOSIS — B20 Human immunodeficiency virus [HIV] disease: Secondary | ICD-10-CM

## 2017-07-13 NOTE — Telephone Encounter (Signed)
Please clarify which med has been changed.  Last Rx for tramadol Jan 2019 with 5 refills Will need to see Zella Ball or myself for refill, will need UDS, CSA, Opioid consent

## 2017-07-13 NOTE — Telephone Encounter (Signed)
Pt called stating her medication has been changed and her pain has increased. Please advice.

## 2017-07-13 NOTE — Telephone Encounter (Signed)
Patient called to see if Dr Baxter Flattery would prescribe something for insomnia. Patient recently at PCP's office for this, RN asked her to follow up with there.  Patient asked if she "needed to get everything somewhere else, even pap smears."   RN advised that we are happy to do annual/routine pap smears in the office, but that we can assist in referral to gynecology if she preferred or if she has a history of abnormal pap smears. Patient stated she would like referral for gynecology.  Landis Gandy, RN

## 2017-07-14 NOTE — Telephone Encounter (Signed)
Called pt and she states Tramadol 50mg  was changed from two tablets three times a day to one tablet three times a day and its not helping with her pain. She states she has tried the change for a month.

## 2017-07-15 MED ORDER — TRAMADOL HCL 50 MG PO TABS: 100.0000 mg | ORAL_TABLET | Freq: Three times a day (TID) | ORAL | 5 refills | Status: DC | PRN

## 2017-07-15 NOTE — Telephone Encounter (Signed)
Not sure whaat she means.  Last Rx 03/15/17 for Tramadol 50mg  2 po TID #180 with 5 refill I don't know about the reduction of 1 tab TID

## 2017-07-15 NOTE — Telephone Encounter (Signed)
Contacted patient and notified. She is okay with script changing back at next refill

## 2017-07-16 ENCOUNTER — Other Ambulatory Visit: Payer: Self-pay | Admitting: Internal Medicine

## 2017-07-19 ENCOUNTER — Other Ambulatory Visit: Payer: Self-pay | Admitting: Physical Medicine & Rehabilitation

## 2017-07-21 ENCOUNTER — Encounter: Payer: Self-pay | Admitting: Internal Medicine

## 2017-07-21 ENCOUNTER — Encounter: Payer: Self-pay | Admitting: Obstetrics and Gynecology

## 2017-07-21 ENCOUNTER — Ambulatory Visit (INDEPENDENT_AMBULATORY_CARE_PROVIDER_SITE_OTHER): Payer: Medicare HMO | Admitting: Internal Medicine

## 2017-07-21 ENCOUNTER — Other Ambulatory Visit: Payer: Self-pay

## 2017-07-21 VITALS — BP 145/90 | HR 60 | Temp 98.3°F | Wt 181.0 lb

## 2017-07-21 DIAGNOSIS — J209 Acute bronchitis, unspecified: Secondary | ICD-10-CM | POA: Diagnosis not present

## 2017-07-21 MED ORDER — ALBUTEROL SULFATE HFA 108 (90 BASE) MCG/ACT IN AERS
2.0000 | INHALATION_SPRAY | Freq: Four times a day (QID) | RESPIRATORY_TRACT | 0 refills | Status: DC | PRN
Start: 2017-07-21 — End: 2017-09-21

## 2017-07-21 MED ORDER — AZITHROMYCIN 250 MG PO TABS: ORAL_TABLET | ORAL | 0 refills | Status: DC

## 2017-07-21 MED FILL — DESCOVY 200-25 MG TABS: 200-25 | 30 days supply | Qty: 90 | Fill #0

## 2017-07-21 MED FILL — TIVICAY 50 MG TABLET: 50 | 30 days supply | Qty: 90 | Fill #0

## 2017-07-21 NOTE — Progress Notes (Signed)
   [facility] Phone: [phone]   Date of Visit: 07/21/2017   HPI:  Cough: - reports of productive cough with clear sputum production for the past few days. She also feels like her chest is tight and has difficulty catching her breath. She has slight nasal congestion and frontal sinus congestion.No ear pain.  Reports of subjective fever last night. She has tried some over the counter cough medications without much improvement. She denies chest pain. She had albuterol from the last time she had bronchitis which helped some. She is a former smoker. She quit 1 year ago; she used to smoke 1 ppd for 30 years. Her father is currently admitted in the hospital for PNA.  - she has a history of HIV. Last viral load on 05/2017 was undetectable. Last CD4 count was 490 in 11/2016.    ROS: See HPI.  [place]:  HIV    PHYSICAL EXAM: BP (!) 145/90   Pulse 60   Temp 98.3 F (36.8 C) (Oral)   Wt 181 lb (82.1 kg)   SpO2 99%   BMI 29.21 kg/m  GEN: NAD, intermittent cough  HEENT: Atraumatic, normocephalic, neck supple without lymphadenopathy, EOMI, sclera clear, oropharynx normal  CV: RRR, no murmurs, rubs, or gallops PULM: CTAB, mild increase in rate of breathing. Rare wheezing at end expiration.  SKIN: No rash or cyanosis; warm and well-perfused EXTR: No lower extremity edema or calf tenderness PSYCH: Mood and affect euthymic, normal rate and volume of speech NEURO: Awake, alert, no focal deficits grossly, normal speech  ASSESSMENT/PLAN:  1. Acute bronchitis, unspecified organism Her oxygen saturation is normal and no crackles noted on exam. She is very mildly tachypneic. I recommended that we obtain a CXR to evaluate for PNA, but she declined because she feels like the last time she had bronchitis. Due to her history of HIV will treat as acute bacterial bronchitis with Azithromycin 500mg  on day one, then 250mg  daily for days 2-5. Return precautions discussed. Albuterol PRN  given since she does have some relief with this.   Smiley Houseman, MD PGY [place]

## 2017-07-21 NOTE — Patient Instructions (Signed)
I sent you a course of antibiotics for probable bacterial bronchitis

## 2017-08-04 ENCOUNTER — Ambulatory Visit (INDEPENDENT_AMBULATORY_CARE_PROVIDER_SITE_OTHER): Payer: Medicare HMO | Admitting: Family Medicine

## 2017-08-04 ENCOUNTER — Encounter: Payer: Self-pay | Admitting: Family Medicine

## 2017-08-04 VITALS — BP 140/120 | HR 58 | Temp 98.4°F | Wt 180.2 lb

## 2017-08-04 DIAGNOSIS — I10 Essential (primary) hypertension: Secondary | ICD-10-CM

## 2017-08-04 DIAGNOSIS — M5440 Lumbago with sciatica, unspecified side: Secondary | ICD-10-CM

## 2017-08-04 DIAGNOSIS — M545 Low back pain, unspecified: Secondary | ICD-10-CM

## 2017-08-04 DIAGNOSIS — G8929 Other chronic pain: Secondary | ICD-10-CM

## 2017-08-04 MED ORDER — LOSARTAN POTASSIUM 25 MG PO TABS: 25.0000 mg | ORAL_TABLET | Freq: Every day | ORAL | 11 refills | Status: DC

## 2017-08-04 MED ORDER — OXYCODONE HCL 5 MG PO TABS: 5.0000 mg | ORAL_TABLET | ORAL | 0 refills | Status: AC | PRN

## 2017-08-04 NOTE — Patient Instructions (Addendum)
It was nice seeing you today Ms. Keilman!  Today, we are starting Losartan 25 mg daily for your blood pressure.  Please take one pill per day.  Please come back in for a nurse visit for a blood pressure check in two weeks.  I am giving you one week's worth of oxycodone to help with your pain.  Please return if your pain is not better.  If you have any questions or concerns, please feel free to call the clinic.   Be well,  Dr. Shan Levans  DASH Eating Plan DASH stands for "Dietary Approaches to Stop Hypertension." The DASH eating plan is a healthy eating plan that has been shown to reduce high blood pressure (hypertension). It may also reduce your risk for type 2 diabetes, heart disease, and stroke. The DASH eating plan may also help with weight loss. What are tips for following this plan? General guidelines  Avoid eating more than 2,300 mg (milligrams) of salt (sodium) a day. If you have hypertension, you may need to reduce your sodium intake to 1,500 mg a day.  Limit alcohol intake to no more than 1 drink a day for nonpregnant women and 2 drinks a day for men. One drink equals 12 oz of beer, 5 oz of wine, or 1 oz of hard liquor.  Work with your health care provider to maintain a healthy body weight or to lose weight. Ask what an ideal weight is for you.  Get at least 30 minutes of exercise that causes your heart to beat faster (aerobic exercise) most days of the week. Activities may include walking, swimming, or biking.  Work with your health care provider or diet and nutrition specialist (dietitian) to adjust your eating plan to your individual calorie needs. Reading food labels  Check food labels for the amount of sodium per serving. Choose foods with less than 5 percent of the Daily Value of sodium. Generally, foods with less than 300 mg of sodium per serving fit into this eating plan.  To find whole grains, look for the word "whole" as the first word in the ingredient  list. Shopping  Buy products labeled as "low-sodium" or "no salt added."  Buy fresh foods. Avoid canned foods and premade or frozen meals. Cooking  Avoid adding salt when cooking. Use salt-free seasonings or herbs instead of table salt or sea salt. Check with your health care provider or pharmacist before using salt substitutes.  Do not fry foods. Cook foods using healthy methods such as baking, boiling, grilling, and broiling instead.  Cook with heart-healthy oils, such as olive, canola, soybean, or sunflower oil. Meal planning   Eat a balanced diet that includes: ? 5 or more servings of fruits and vegetables each day. At each meal, try to fill half of your plate with fruits and vegetables. ? Up to 6-8 servings of whole grains each day. ? Less than 6 oz of lean meat, poultry, or fish each day. A 3-oz serving of meat is about the same size as a deck of cards. One egg equals 1 oz. ? 2 servings of low-fat dairy each day. ? A serving of nuts, seeds, or beans 5 times each week. ? Heart-healthy fats. Healthy fats called Omega-3 fatty acids are found in foods such as flaxseeds and coldwater fish, like sardines, salmon, and mackerel.  Limit how much you eat of the following: ? Canned or prepackaged foods. ? Food that is high in trans fat, such as fried foods. ? Food that is  high in saturated fat, such as fatty meat. ? Sweets, desserts, sugary drinks, and other foods with added sugar. ? Full-fat dairy products.  Do not salt foods before eating.  Try to eat at least 2 vegetarian meals each week.  Eat more home-cooked food and less restaurant, buffet, and fast food.  When eating at a restaurant, ask that your food be prepared with less salt or no salt, if possible. What foods are recommended? The items listed may not be a complete list. Talk with your dietitian about what dietary choices are best for you. Grains Whole-grain or whole-wheat bread. Whole-grain or whole-wheat pasta. Brown  rice. [place]. Bulgur. Whole-grain and low-sodium cereals. Pita bread. Low-fat, low-sodium crackers. Whole-wheat flour tortillas. Vegetables Fresh or frozen vegetables (raw, steamed, roasted, or grilled). Low-sodium or reduced-sodium tomato and vegetable juice. Low-sodium or reduced-sodium tomato sauce and tomato paste. Low-sodium or reduced-sodium canned vegetables. Fruits All fresh, dried, or frozen fruit. Canned fruit in natural juice (without added sugar). Meat and other protein foods Skinless chicken or [place]. Ground chicken or [place]. Pork with fat trimmed off. Fish and seafood. Egg whites. Dried beans, peas, or lentils. Unsalted nuts, nut butters, and seeds. Unsalted canned beans. Lean cuts of beef with fat trimmed off. Low-sodium, lean deli meat. Dairy Low-fat (1%) or fat-free (skim) milk. Fat-free, low-fat, or reduced-fat cheeses. Nonfat, low-sodium ricotta or cottage cheese. Low-fat or nonfat yogurt. Low-fat, low-sodium cheese. Fats and oils Soft margarine without trans fats. Vegetable oil. Low-fat, reduced-fat, or light mayonnaise and salad dressings (reduced-sodium). Canola, safflower, olive, soybean, and sunflower oils. Avocado. Seasoning and other foods Herbs. Spices. Seasoning mixes without salt. Unsalted popcorn and pretzels. Fat-free sweets. What foods are not recommended? The items listed may not be a complete list. Talk with your dietitian about what dietary choices are best for you. Grains Baked goods made with fat, such as croissants, muffins, or some breads. Dry pasta or rice meal packs. Vegetables Creamed or fried vegetables. Vegetables in a cheese sauce. Regular canned vegetables (not low-sodium or reduced-sodium). Regular canned tomato sauce and paste (not low-sodium or reduced-sodium). Regular tomato and vegetable juice (not low-sodium or reduced-sodium). Angie Fava. Olives. Fruits Canned fruit in a light or heavy syrup. Fried fruit. Fruit in cream or butter  sauce. Meat and other protein foods Fatty cuts of meat. Ribs. Fried meat. Berniece Salines. Sausage. Bologna and other processed lunch meats. Salami. Fatback. Hotdogs. Bratwurst. Salted nuts and seeds. Canned beans with added salt. Canned or smoked fish. Whole eggs or egg yolks. Chicken or [place] with skin. Dairy Whole or 2% milk, cream, and half-and-half. Whole or full-fat cream cheese. Whole-fat or sweetened yogurt. Full-fat cheese. Nondairy creamers. Whipped toppings. Processed cheese and cheese spreads. Fats and oils Butter. Stick margarine. Lard. Shortening. Ghee. Bacon fat. Tropical oils, such as coconut, palm kernel, or palm oil. Seasoning and other foods Salted popcorn and pretzels. Onion salt, garlic salt, seasoned salt, table salt, and sea salt. Worcestershire sauce. Tartar sauce. Barbecue sauce. Teriyaki sauce. Soy sauce, including reduced-sodium. Steak sauce. Canned and packaged gravies. Fish sauce. Oyster sauce. Cocktail sauce. Horseradish that you find on the shelf. Ketchup. Mustard. Meat flavorings and tenderizers. Bouillon cubes. Hot sauce and Tabasco sauce. Premade or packaged marinades. Premade or packaged taco seasonings. Relishes. Regular salad dressings. Where to find more information:  National Heart, Lung, and [place]: https://wilson-eaton.com/  American Heart Association: www.heart.org Summary  The DASH eating plan is a healthy eating plan that has been shown to reduce high blood pressure (hypertension). It  may also reduce your risk for type 2 diabetes, heart disease, and stroke.  With the DASH eating plan, you should limit salt (sodium) intake to 2,300 mg a day. If you have hypertension, you may need to reduce your sodium intake to 1,500 mg a day.  When on the DASH eating plan, aim to eat more fresh fruits and vegetables, whole grains, lean proteins, low-fat dairy, and heart-healthy fats.  Work with your health care provider or diet and nutrition specialist (dietitian) to adjust  your eating plan to your individual calorie needs. This information is not intended to replace advice given to you by your health care provider. Make sure you discuss any questions you have with your health care provider. Document Released: 02/12/2011 Document Revised: 02/17/2016 Document Reviewed: 02/17/2016 Elsevier Interactive Patient Education  2018 [place].  Back Exercises The following exercises strengthen the muscles that help to support the back. They also help to keep the lower back flexible. Doing these exercises can help to prevent back pain or lessen existing pain. If you have back pain or discomfort, try doing these exercises 2-3 times each day or as told by your health care provider. When the pain goes away, do them once each day, but increase the number of times that you repeat the steps for each exercise (do more repetitions). If you do not have back pain or discomfort, do these exercises once each day or as told by your health care provider. Exercises Single Knee to Chest  Repeat these steps 3-5 times for each leg: 1. Lie on your back on a firm bed or the floor with your legs extended. 2. Bring one knee to your chest. Your other leg should stay extended and in contact with the floor. 3. Hold your knee in place by grabbing your knee or thigh. 4. Pull on your knee until you feel a gentle stretch in your lower back. 5. Hold the stretch for 10-30 seconds. 6. Slowly release and straighten your leg.  Pelvic Tilt  Repeat these steps 5-10 times: 1. Lie on your back on a firm bed or the floor with your legs extended. 2. Bend your knees so they are pointing toward the ceiling and your feet are flat on the floor. 3. Tighten your lower abdominal muscles to press your lower back against the floor. This motion will tilt your pelvis so your tailbone points up toward the ceiling instead of pointing to your feet or the floor. 4. With gentle tension and even breathing, hold this  position for 5-10 seconds.  Cat-Cow  Repeat these steps until your lower back becomes more flexible: 1. Get into a hands-and-knees position on a firm surface. Keep your hands under your shoulders, and keep your knees under your hips. You may place padding under your knees for comfort. 2. Let your head hang down, and point your tailbone toward the floor so your lower back becomes rounded like the back of a cat. 3. Hold this position for 5 seconds. 4. Slowly lift your head and point your tailbone up toward the ceiling so your back forms a sagging arch like the back of a cow. 5. Hold this position for 5 seconds.  Press-Ups  Repeat these steps 5-10 times: 1. Lie on your abdomen (face-down) on the floor. 2. Place your palms near your head, about shoulder-width apart. 3. While you keep your back as relaxed as possible and keep your hips on the floor, slowly straighten your arms to raise the top half of  your body and lift your shoulders. Do not use your back muscles to raise your upper torso. You may adjust the placement of your hands to make yourself more comfortable. 4. Hold this position for 5 seconds while you keep your back relaxed. 5. Slowly return to lying flat on the floor.  Bridges  Repeat these steps 10 times: 1. Lie on your back on a firm surface. 2. Bend your knees so they are pointing toward the ceiling and your feet are flat on the floor. 3. Tighten your buttocks muscles and lift your buttocks off of the floor until your waist is at almost the same height as your knees. You should feel the muscles working in your buttocks and the back of your thighs. If you do not feel these muscles, slide your feet 1-2 inches farther away from your buttocks. 4. Hold this position for 3-5 seconds. 5. Slowly lower your hips to the starting position, and allow your buttocks muscles to relax completely.  If this exercise is too easy, try doing it with your arms crossed over your chest. Abdominal  Crunches  Repeat these steps 5-10 times: 1. Lie on your back on a firm bed or the floor with your legs extended. 2. Bend your knees so they are pointing toward the ceiling and your feet are flat on the floor. 3. Cross your arms over your chest. 4. Tip your chin slightly toward your chest without bending your neck. 5. Tighten your abdominal muscles and slowly raise your trunk (torso) high enough to lift your shoulder blades a tiny bit off of the floor. Avoid raising your torso higher than that, because it can put too much stress on your low back and it does not help to strengthen your abdominal muscles. 6. Slowly return to your starting position.  Back Lifts Repeat these steps 5-10 times: 1. Lie on your abdomen (face-down) with your arms at your sides, and rest your forehead on the floor. 2. Tighten the muscles in your legs and your buttocks. 3. Slowly lift your chest off of the floor while you keep your hips pressed to the floor. Keep the back of your head in line with the curve in your back. Your eyes should be looking at the floor. 4. Hold this position for 3-5 seconds. 5. Slowly return to your starting position.  Contact a health care provider if:  Your back pain or discomfort gets much worse when you do an exercise.  Your back pain or discomfort does not lessen within 2 hours after you exercise. If you have any of these problems, stop doing these exercises right away. Do not do them again unless your health care provider says that you can. Get help right away if:  You develop sudden, severe back pain. If this happens, stop doing the exercises right away. Do not do them again unless your health care provider says that you can. This information is not intended to replace advice given to you by your health care provider. Make sure you discuss any questions you have with your health care provider. Document Released: 04/02/2004 Document Revised: 07/03/2015 Document Reviewed:  04/19/2014 Elsevier Interactive Patient Education  2017 [employer].

## 2017-08-04 NOTE — Progress Notes (Signed)
Subjective:    Courtney Hamilton - 57 y.o. female MRN [id number]  Date of birth: [birth year]  HPI  Nakima Kievit is here for high blood pressure and lower back and leg pain.  High blood pressure - has had multiple high blood pressures since 5/24, with measurements taken at [facility] and another pharmacy - BP's have ranged mostly in the 170s/90s two weeks ago then 150s/80s last week - no headache or blurry vision - is not currently prescribed blood pressure medication  Lower back and leg pain - was on her feet for an extended period on 5/19 cooking dinner for her family - usually her pain improves after a couple days from these dinners, but it has continued this time - pain is sharp and is located in her lower pack, hips, and posterior thighs - tramadol, heating pad, and gabapentin have not been helpful - would pain medication to get through this period of intense pain so that she can clean house again and start walking    Health Maintenance:  Health Maintenance Due  Topic Date Due  . PAP SMEAR  02/09/2017  . TETANUS/TDAP  05/22/2017    -  reports that she has quit smoking. Her smoking use included cigarettes. She has a 15.00 pack-year smoking history. She has never used smokeless tobacco. - Review of Systems: Per HPI. - Past Medical History: Patient Active Problem List   Diagnosis Date Noted  . Essential hypertension 08/05/2017  . Fungal infection of skin of abdomen 06/16/2017  . Sore throat 11/19/2016  . Upper respiratory infection, viral 07/21/2016  . Flu-like symptoms 05/25/2016  . Head injury 11/05/2015  . Insomnia 11/05/2015  . Smoking 11/05/2015  . Chronic low back pain without sciatica 02/18/2015  . Fall 01/14/2015  . Lower abdominal pain 10/08/2014  . Generalized anxiety disorder 06/04/2014  . Ready to quit smoking 04/06/2014  . Anxiety state 01/07/2014  . Panic attacks 12/22/2013  . Lumbosacral spondylosis without myelopathy 09/29/2013  . 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 Lumbar paraspinal muscle spasm 01/16/2013  . Reflux 12/13/2011  . Lower leg edema 12/10/2011  . Herpes simplex 10/07/2011  . Peripheral neuropathy 10/07/2011  . Hypercholesteremia 10/07/2011  . History of tuberculosis exposure 10/01/2011  . Recurrent UTI 08/06/2011  . HIV positive ([place]) 08/03/2011  . Sleep disorder 08/03/2011  . Back pain 07/21/2011  . Osteoporosis 03/09/2008   - Medications: reviewed and updated   Objective:   Physical Exam BP (!) 140/120 (BP Location: Right Arm, Patient Position: Sitting, Cuff Size: Normal)   Pulse (!) 58   Temp 98.4 F (36.9 C) (Oral)   Wt 180 lb 3.2 oz (81.7 kg)   SpO2 99%   BMI 29.09 kg/m  Gen: NAD, alert, cooperative with exam, well-appearing HEENT: NCAT, clear conjunctiva, supple neck CV: RRR, good S1/S2, no murmur Resp: CTABL, no wheezes, non-labored Abd: SNTND, BS present, no guarding or organomegaly Skin: no rashes, normal turgor  Neuro: no gross deficits.  Psych: good insight, alert and oriented Musculoskeletal: tenderness to palpation of R lower paraspinal muscles, reduced range of motion in back flexion, extension, and rotation, negative straight leg raise bilaterally, no swelling in thighs       Assessment & Plan:   Essential hypertension Will start losartan 25 mg today and have patient return in two weeks for a nurse visit to get BP checked.  She can continue to check it at [facility] as well and keep a long.  Will adjust dose  as needed depending on her BP at the nurse visit.  Chronic low back pain without sciatica Will prescribe one week's worth of oxycodone 5 mg for acute pain exacerbation.  Patient agrees that this will be a short course and will not be refilled.  If pain does not improve, will need to consider physical therapy.  Handout given with back exercises and stretches since her paraspinal muscles, hips, and thighs seem very tight.    Maia Breslow, M.D. 08/05/2017,  3:39 PM PGY-1, [place]

## 2017-08-05 DIAGNOSIS — I10 Essential (primary) hypertension: Secondary | ICD-10-CM | POA: Insufficient documentation

## 2017-08-05 NOTE — Assessment & Plan Note (Signed)
Will prescribe one week's worth of oxycodone 5 mg for acute pain exacerbation.  Patient agrees that this will be a short course and will not be refilled.  If pain does not improve, will need to consider physical therapy.  Handout given with back exercises and stretches since her paraspinal muscles, hips, and thighs seem very tight.

## 2017-08-05 NOTE — Assessment & Plan Note (Signed)
Will start losartan 25 mg today and have patient return in two weeks for a nurse visit to get BP checked.  She can continue to check it at [facility] as well and keep a long.  Will adjust dose as needed depending on her BP at the nurse visit.

## 2017-08-10 ENCOUNTER — Other Ambulatory Visit: Payer: Self-pay | Admitting: Family Medicine

## 2017-08-10 DIAGNOSIS — F17200 Nicotine dependence, unspecified, uncomplicated: Secondary | ICD-10-CM

## 2017-08-12 ENCOUNTER — Other Ambulatory Visit: Payer: Self-pay | Admitting: *Deleted

## 2017-08-12 DIAGNOSIS — B20 Human immunodeficiency virus [HIV] disease: Secondary | ICD-10-CM

## 2017-08-23 ENCOUNTER — Other Ambulatory Visit: Payer: Medicare HMO

## 2017-08-23 ENCOUNTER — Encounter: Payer: Medicare HMO | Admitting: Obstetrics and Gynecology

## 2017-08-27 ENCOUNTER — Ambulatory Visit (HOSPITAL_BASED_OUTPATIENT_CLINIC_OR_DEPARTMENT_OTHER): Payer: Medicare HMO | Admitting: Physical Medicine & Rehabilitation

## 2017-08-27 ENCOUNTER — Encounter: Payer: Self-pay | Admitting: Physical Medicine & Rehabilitation

## 2017-08-27 ENCOUNTER — Other Ambulatory Visit: Payer: Self-pay

## 2017-08-27 ENCOUNTER — Encounter: Payer: Medicare HMO | Attending: Physical Medicine & Rehabilitation

## 2017-08-27 VITALS — BP 111/77 | HR 56 | Ht 66.0 in | Wt 180.0 lb

## 2017-08-27 DIAGNOSIS — M47817 Spondylosis without myelopathy or radiculopathy, lumbosacral region: Secondary | ICD-10-CM | POA: Diagnosis not present

## 2017-08-27 DIAGNOSIS — M545 Low back pain: Secondary | ICD-10-CM | POA: Diagnosis present

## 2017-08-27 NOTE — Progress Notes (Signed)
Bilateral Lumbar L3, L4  medial branch blocks and L 5 dorsal ramus injection under fluoroscopic guidance   Indication: Lumbar pain which is not relieved by medication management or other conservative care and interfering with self-care and mobility.(Hx of HIV, 05/18/17 HIV RNA nondetectable)  Informed consent was obtained after describing risks and benefits of the procedure with the patient, this includes bleeding, infection, paralysis and medication side effects.  The patient wishes to proceed and has given written consent.  The patient was placed in prone position.  The lumbar area was marked and prepped with Betadine.  One mL of 1% lidocaine was injected into each of 6 areas into the skin and subcutaneous tissue.  Then a 22-gauge 3.5in spinal needle was inserted targeting the junction of the left S1 superior articular process and sacral ala junction. Needle was advanced under fluoroscopic guidance.  Bone contact was made.  Isovue 200 was injected x 0.5 mL demonstrating no intravascular uptake.  Then a solution  of 2% MPF lidocaine was injected x 0.5 mL.  Then the left L5 superior articular process in transverse process junction was targeted.  Bone contact was made.  Isovue 200 was injected x 0.5 mL demonstrating no intravascular uptake. Then a solution containing  2% MPF lidocaine was injected x 0.5 mL.  Then the left L4 superior articular process in transverse process junction was targeted.  Bone contact was made.  Isovue 200 was injected x 0.5 mL demonstrating no intravascular uptake.  Then a solution containing2% MPF lidocaine was injected x 0.5 mL.  This same procedure was performed on the right side using the same needle, technique and injectate.  Patient tolerated procedure well.  Post procedure instructions were given.

## 2017-08-27 NOTE — Progress Notes (Signed)
  PROCEDURE RECORD [facility]   Name: Nadina Fomby DOB:09-18-1960 MRN: [id number]  Date:08/27/2017  Physician: Alysia Penna, MD    Nurse/CMA: Bright CMA  Allergies:  Allergies  Allergen Reactions  . Baclofen Other (See Comments)    Perioral paresthesia  . Bactrim [Sulfamethoxazole-Trimethoprim] Rash  . Doxycycline Rash    Consent Signed: Yes.    Is patient diabetic? No.  CBG today? na  Pregnant: No. LMP: No LMP recorded. Patient is postmenopausal. (age 47-55)  Anticoagulants: no Anti-inflammatory: no Antibiotics: no  Procedure: Bilateral L3-5 MBB Position: Prone   Start Time: 3:08pm End Time: 3:25pm Fluoro Time: 73s   RN/CMA shumaker RN Bright CMA    Time 2:55pm 3:27pm    BP 111/77 135/95    Pulse 56 57    Respirations 16 16    O2 Sat 98 97    S/S 6 6    Pain Level 9/10 6/10     D/C home with Son Ahmed, patient A & O X 3, D/C instructions reviewed, and sits independently.

## 2017-08-30 ENCOUNTER — Other Ambulatory Visit: Payer: Medicare HMO

## 2017-08-30 DIAGNOSIS — B20 Human immunodeficiency virus [HIV] disease: Secondary | ICD-10-CM

## 2017-08-30 DIAGNOSIS — R69 Illness, unspecified: Secondary | ICD-10-CM | POA: Diagnosis not present

## 2017-08-31 LAB — T-HELPER CELL (CD4) - (RCID CLINIC ONLY)
CD4 % Helper T Cell: 14 % — ABNORMAL LOW (ref 33–55)
CD4 T CELL ABS: 340 /uL — AB (ref 400–2700)

## 2017-09-02 LAB — HIV-1 RNA QUANT-NO REFLEX-BLD
HIV 1 RNA Quant: <20 copies/mL
HIV-1 RNA QUANT, LOG: NOT DETECTED {Log_copies}/mL

## 2017-09-06 ENCOUNTER — Ambulatory Visit: Payer: Medicare HMO | Admitting: Internal Medicine

## 2017-09-06 HISTORY — PX: OTHER SURGICAL HISTORY: SHX169

## 2017-09-16 ENCOUNTER — Other Ambulatory Visit: Payer: Self-pay | Admitting: Physical Medicine & Rehabilitation

## 2017-09-21 ENCOUNTER — Other Ambulatory Visit: Payer: Self-pay

## 2017-09-22 MED ORDER — ALBUTEROL SULFATE HFA 108 (90 BASE) MCG/ACT IN AERS
2.0000 | INHALATION_SPRAY | Freq: Four times a day (QID) | RESPIRATORY_TRACT | 5 refills | Status: DC | PRN
Start: 2017-09-22 — End: 2019-06-05

## 2017-09-24 ENCOUNTER — Ambulatory Visit (INDEPENDENT_AMBULATORY_CARE_PROVIDER_SITE_OTHER): Payer: Medicare HMO | Admitting: Obstetrics and Gynecology

## 2017-09-24 ENCOUNTER — Encounter: Payer: Self-pay | Admitting: Obstetrics and Gynecology

## 2017-09-24 ENCOUNTER — Other Ambulatory Visit (HOSPITAL_COMMUNITY)
Admission: RE | Admit: 2017-09-24 | Discharge: 2017-09-24 | Disposition: A | Discharge status: Home / Self Care | Payer: Medicare HMO | Source: Ambulatory Visit | Attending: Obstetrics and Gynecology | Admitting: Obstetrics and Gynecology

## 2017-09-24 VITALS — BP 127/90 | HR 57 | Ht 67.0 in | Wt 181.0 lb

## 2017-09-24 DIAGNOSIS — Z1151 Encounter for screening for human papillomavirus (HPV): Secondary | ICD-10-CM | POA: Diagnosis not present

## 2017-09-24 DIAGNOSIS — Z01419 Encounter for gynecological examination (general) (routine) without abnormal findings: Secondary | ICD-10-CM | POA: Diagnosis not present

## 2017-09-24 DIAGNOSIS — R69 Illness, unspecified: Secondary | ICD-10-CM | POA: Diagnosis not present

## 2017-09-24 DIAGNOSIS — Z01411 Encounter for gynecological examination (general) (routine) with abnormal findings: Secondary | ICD-10-CM | POA: Insufficient documentation

## 2017-09-24 DIAGNOSIS — N898 Other specified noninflammatory disorders of vagina: Secondary | ICD-10-CM

## 2017-09-24 NOTE — Progress Notes (Signed)
GYNECOLOGY ANNUAL PREVENTATIVE CARE ENCOUNTER NOTE  Subjective:   Courtney Hamilton is a 57 y.o. [phone] female here for a annual gynecologic exam. Current complaints: vaginal itching that comes and goes, denies discharge. Denies odor. Last menstrual period 2003, no bleeding since.  Denies pelvic pain. She is not really sexually active due to husband's ED and she has dry vaginal skin. Has had hot flashes since 2003, reports they have gotten much better than they used to be.  H/o abnormal pap with cryotherapy> 20 years ago, has had abnormal pap since but has not needed further treatment.   Gynecologic History No LMP recorded. Patient is postmenopausal. Contraception: post menopausal status Last Pap: 2015. Results were: negative Last mammogram: 04/07/2017 Results were: Birads 1  Obstetric History OB History  Gravida Para Term Preterm AB Living  4 1 1  0 3 1  SAB TAB Ectopic Multiple Live Births  1 1 1  0 1    # Outcome Date GA Lbr Len/2nd Weight Sex Delivery Anes PTL Lv  4 Ectopic           3 TAB           2 SAB           1 Term             Past Medical History:  Diagnosis Date  . Herniated disc 10/07/2011  . HIV (human immunodeficiency virus infection) ([place])   . Lower GI bleed 05/10/2012  . Osteoporosis   . Ruptured lumbar disc   . Sciatica   . Substance abuse ([place])    past hsitory  clean more than 20 years   . TB (pulmonary tuberculosis) 1993    exposure, treated     Past Surgical History:  Procedure Laterality Date  . APPENDECTOMY    . BREAST EXCISIONAL BIOPSY Left 2001 per pt  . CHOLECYSTECTOMY    . ECTOPIC PREGNANCY SURGERY    . SALIVARY GLAND SURGERY      Current Outpatient Medications on File Prior to Visit  Medication Sig Dispense Refill  . albuterol (VENTOLIN HFA) 108 (90 Base) MCG/ACT inhaler Inhale 2 puffs into the lungs every 6 (six) hours as needed for wheezing or shortness of breath. 1 Inhaler 5  . Biotin 5000 MCG CAPS Take 5,000 mcg by mouth daily.    .  Calcium-Magnesium-Zinc 167-83-8 MG TABS Take 1 tablet by mouth daily.    . Cyanocobalamin 1000 MCG SUBL Place 1,000 mcg under the tongue daily.     . DESCOVY 200-25 MG tablet TAKE 1 TABLET BY MOUTH DAILY. 90 tablet 2  . gabapentin (NEURONTIN) 300 MG capsule TAKE 1 CAPSULE BY MOUTH THREE TIMES A DAY 90 capsule 1  . losartan (COZAAR) 25 MG tablet Take 1 tablet (25 mg total) by mouth daily. 30 tablet 11  . NICOTINE STEP 1 21 MG/24HR patch PLACE 1 PATCH (21 MG TOTAL) ONTO THE SKIN DAILY. 28 patch 1  . TIVICAY 50 MG tablet TAKE 1 TABLET (50 MG TOTAL) BY MOUTH DAILY. 90 tablet 2  . traMADol (ULTRAM) 50 MG tablet Take 2 tablets (100 mg total) by mouth 3 (three) times daily as needed for moderate pain. 180 tablet 5  . vitamin C (ASCORBIC ACID) 500 MG tablet Take 500 mg by mouth daily.    . clotrimazole (MYCELEX) 10 MG troche Take 10 mg by mouth as needed (thrush). Reported on 05/28/2015    . nystatin (MYCOSTATIN) 100000 UNIT/ML suspension Take 10 mLs (1,000,000 Units total)  by mouth 2 (two) times daily. (Patient not taking: Reported on 09/24/2017) 120 mL 0  . nystatin cream (MYCOSTATIN) Apply 1 application topically 2 (two) times daily. (Patient not taking: Reported on 09/24/2017) 30 g 1  . vitamin E 400 UNIT capsule Take 400 Units by mouth daily.     No current facility-administered medications on file prior to visit.     Allergies  Allergen Reactions  . Baclofen Other (See Comments)    Perioral paresthesia  . Bactrim [Sulfamethoxazole-Trimethoprim] Rash  . Doxycycline Rash    Social History   Socioeconomic History  . Marital status: Single    Spouse name: Not on file  . Number of children: Not on file  . Years of education: Not on file  . Highest education level: Not on file  Occupational History  . Not on file  Social Needs  . Financial resource strain: Not on file  . Food insecurity:    Worry: Not on file    Inability: Not on file  . Transportation needs:    Medical: Not on file     Non-medical: Not on file  Tobacco Use  . Smoking status: Former Smoker    Packs/day: 0.50    Years: 30.00    Pack years: 15.00    Types: Cigarettes  . Smokeless tobacco: Never Used  . Tobacco comment: is trying to quit at this time  Substance and Sexual Activity  . Alcohol use: No  . Drug use: No    Comment: past history of cocaine  and alcohol    . Sexual activity: Never    Partners: Male    Comment: declined condoms   Lifestyle  . Physical activity:    Days per week: Not on file    Minutes per session: Not on file  . Stress: Not on file  Relationships  . Social connections:    Talks on phone: Not on file    Gets together: Not on file    Attends religious service: Not on file    Active member of club or organization: Not on file    Attends meetings of clubs or organizations: Not on file    Relationship status: Not on file  . Intimate partner violence:    Fear of current or ex partner: Not on file    Emotionally abused: Not on file    Physically abused: Not on file    Forced sexual activity: Not on file  Other Topics Concern  . Not on file  Social History Narrative  . Not on file    Family History  Problem Relation Age of Onset  . Asthma Father   . Diabetes Brother   . Hypertension Brother     The following portions of the patient's history were reviewed and updated as appropriate: allergies, current medications, past family history, past medical history, past social history, past surgical history and problem list.  Review of Systems Pertinent items are noted in HPI.   Objective:  BP 127/90   Pulse (!) 57   Ht 5\' 7"  (1.702 m)   Wt 181 lb (82.1 kg)   BMI 28.35 kg/m  CONSTITUTIONAL: Well-developed, well-nourished female in no acute distress.  HENT:  Normocephalic, atraumatic, External right and left ear normal. Oropharynx is clear and moist EYES: Conjunctivae and EOM are normal. Pupils are equal, round, and reactive to light. No scleral icterus.  NECK:  Normal range of motion, supple, no masses.  Normal thyroid.  SKIN: Skin is warm and  dry. No rash noted. Not diaphoretic. No erythema. No pallor. NEUROLOGIC: Alert and oriented to person, place, and time. Normal reflexes, muscle tone coordination. No cranial nerve deficit noted. PSYCHIATRIC: Normal mood and affect. Normal behavior. Normal judgment and thought content. CARDIOVASCULAR: Normal heart rate noted, regular rhythm RESPIRATORY: Clear to auscultation bilaterally. Effort and breath sounds normal, no problems with respiration noted. BREASTS: Symmetric in size. No masses, skin changes, nipple drainage, or lymphadenopathy. ABDOMEN: Soft, normal bowel sounds, no distention noted.  No tenderness, rebound or guarding.  PELVIC: Normal appearing external genitalia with white discharge at introitus, whitish lesion at clitoris approx 3 mm in width, pinpoint lesion at right labia majora with appearance of small ulcer, 2 mm plaquelike lesion at right labia minora, 2 x 4 cm black plaquelike lesion on right upper thigh, vaginal mucosa atrophic appearing, cervix not visible but is palpable at vaginal apex, blind pap done in area, pelvic cultures obtained, no tenderness or masses palpated, rectum with one soft hemorrhoid and whiteish plaque around anus MUSCULOSKELETAL: Normal range of motion. No tenderness.  No cyanosis, clubbing, or edema.  2+ distal pulses.   Assessment and Plan:   1. Well woman exam with routine gynecological exam Recent RPR/Hep B testing available in Epic - Cytology - PAP  2. Vaginal discharge Possible yeast infection - Cervicovaginal ancillary only  3. Vaginal itching See above  4. Vaginal lesion Has never had any lesion biopsied, recommended biopsy given itching of plaques Will return for biopsies of multiple lesions    Will follow up results of pap smear/STI screen and manage accordingly. Encouraged improvement in diet and exercise.   Routine preventative health  maintenance measures emphasized. Please refer to After Visit Summary for other counseling recommendations.    Feliz Beam, M.D. [facility] for [facility]

## 2017-09-24 NOTE — Addendum Note (Signed)
Addended by: Vivien Rota on: 09/24/2017 11:14 AM   Modules accepted: Orders

## 2017-09-27 LAB — CERVICOVAGINAL ANCILLARY ONLY
Bacterial vaginitis: POSITIVE — AB
CANDIDA VAGINITIS: NEGATIVE
CHLAMYDIA, DNA PROBE: NEGATIVE
NEISSERIA GONORRHEA: NEGATIVE
Trichomonas: NEGATIVE

## 2017-09-27 MED ORDER — METRONIDAZOLE 500 MG PO TABS: 500.0000 mg | ORAL_TABLET | Freq: Two times a day (BID) | ORAL | 0 refills | Status: DC

## 2017-09-27 NOTE — Addendum Note (Signed)
Addended by: Vivien Rota on: 09/27/2017 03:36 PM   Modules accepted: Orders

## 2017-09-28 LAB — CYTOLOGY - PAP
DIAGNOSIS: NEGATIVE
HPV (WINDOPATH): NOT DETECTED

## 2017-10-04 ENCOUNTER — Encounter: Payer: Self-pay | Admitting: Internal Medicine

## 2017-10-04 ENCOUNTER — Ambulatory Visit (INDEPENDENT_AMBULATORY_CARE_PROVIDER_SITE_OTHER): Payer: Medicare HMO | Admitting: Internal Medicine

## 2017-10-04 VITALS — BP 122/72 | HR 56 | Temp 98.2°F | Ht 66.0 in | Wt 182.0 lb

## 2017-10-04 DIAGNOSIS — I10 Essential (primary) hypertension: Secondary | ICD-10-CM

## 2017-10-04 DIAGNOSIS — Z79899 Other long term (current) drug therapy: Secondary | ICD-10-CM

## 2017-10-04 DIAGNOSIS — B20 Human immunodeficiency virus [HIV] disease: Secondary | ICD-10-CM

## 2017-10-04 DIAGNOSIS — R69 Illness, unspecified: Secondary | ICD-10-CM | POA: Diagnosis not present

## 2017-10-04 NOTE — Progress Notes (Signed)
RFV: follow up for hiv disease  Patient ID: Courtney Hamilton, female   DOB: [birth year], 57 y.o.   MRN: [id number]  HPI Courtney Hamilton is a 57yo F with hiv disease, well-controlled, CD 4 count of 340/VL<20 in June 2019, on tivicay/descovy. She is doing well with adherence to her medication. She states that she has been in good spirits. She reports that her husband has stopped drinking alcohol. She continues to care for her 3 grandchildren. She is slightly worried that she won't be able to meet the obligation of being called for jury duty.  Courtney Hamilton is in good health.  Outpatient Encounter Medications as of 10/04/2017  Medication Sig  . albuterol (VENTOLIN HFA) 108 (90 Base) MCG/ACT inhaler Inhale 2 puffs into the lungs every 6 (six) hours as needed for wheezing or shortness of breath.  . Biotin 5000 MCG CAPS Take 5,000 mcg by mouth daily.  . Calcium-Magnesium-Zinc 167-83-8 MG TABS Take 1 tablet by mouth daily.  . clotrimazole (MYCELEX) 10 MG troche Take 10 mg by mouth as needed (thrush). Reported on 05/28/2015  . Cyanocobalamin 1000 MCG SUBL Place 1,000 mcg under the tongue daily.   . DESCOVY 200-25 MG tablet TAKE 1 TABLET BY MOUTH DAILY.  Marland Kitchen gabapentin (NEURONTIN) 300 MG capsule TAKE 1 CAPSULE BY MOUTH THREE TIMES A DAY  . losartan (COZAAR) 25 MG tablet Take 1 tablet (25 mg total) by mouth daily.  . metroNIDAZOLE (FLAGYL) 500 MG tablet Take 1 tablet (500 mg total) by mouth 2 (two) times daily.  Marland Kitchen NICOTINE STEP 1 21 MG/24HR patch PLACE 1 PATCH (21 MG TOTAL) ONTO THE SKIN DAILY.  Marland Kitchen nystatin (MYCOSTATIN) 100000 UNIT/ML suspension Take 10 mLs (1,000,000 Units total) by mouth 2 (two) times daily.  Marland Kitchen TIVICAY 50 MG tablet TAKE 1 TABLET (50 MG TOTAL) BY MOUTH DAILY.  . traMADol (ULTRAM) 50 MG tablet Take 2 tablets (100 mg total) by mouth 3 (three) times daily as needed for moderate pain.  . vitamin C (ASCORBIC ACID) 500 MG tablet Take 500 mg by mouth daily.  . vitamin E 400 UNIT capsule Take 400 Units by  mouth daily.  Marland Kitchen nystatin cream (MYCOSTATIN) Apply 1 application topically 2 (two) times daily. (Patient not taking: Reported on 10/04/2017)   No facility-administered encounter medications on file as of 10/04/2017.      Patient Active Problem List   Diagnosis Date Noted  . Essential hypertension 08/05/2017  . Fungal infection of skin of abdomen 06/16/2017  . Sore throat 11/19/2016  . Upper respiratory infection, viral 07/21/2016  . Flu-like symptoms 05/25/2016  . Head injury 11/05/2015  . Insomnia 11/05/2015  . Smoking 11/05/2015  . Chronic low back pain without sciatica 02/18/2015  . Fall 01/14/2015  . Lower abdominal pain 10/08/2014  . Generalized anxiety disorder 06/04/2014  . Ready to quit smoking 04/06/2014  . Anxiety state 01/07/2014  . Panic attacks 12/22/2013  . Lumbosacral spondylosis without myelopathy 09/29/2013  . 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 Lumbar paraspinal muscle spasm 01/16/2013  . Reflux 12/13/2011  . Lower leg edema 12/10/2011  . Herpes simplex 10/07/2011  . Peripheral neuropathy 10/07/2011  . Hypercholesteremia 10/07/2011  . History of tuberculosis exposure 10/01/2011  . Recurrent UTI 08/06/2011  . HIV positive ([place]) 08/03/2011  . Sleep disorder 08/03/2011  . Back pain 07/21/2011  . Osteoporosis 03/09/2008     Health Maintenance Due  Topic Date Due  . TETANUS/TDAP  05/22/2017     Review of Systems  Constitutional: Negative  for fever, chills, diaphoresis, activity change, appetite change, fatigue and unexpected weight change.  HENT: Negative for congestion, sore throat, rhinorrhea, sneezing, trouble swallowing and sinus pressure.  Eyes: Negative for photophobia and visual disturbance.  Respiratory: Negative for cough, chest tightness, shortness of breath, wheezing and stridor.  Cardiovascular: Negative for chest pain, palpitations and leg swelling.  Gastrointestinal: Negative for nausea, vomiting, abdominal pain, diarrhea,  constipation, blood in stool, abdominal distention and anal bleeding.  Genitourinary: Negative for dysuria, hematuria, flank pain and difficulty urinating.  Musculoskeletal: + arthritis.  Skin: Negative for color change, pallor, rash and wound.  Neurological: Negative for dizziness, tremors, weakness and light-headedness.  Hematological: Negative for adenopathy. Does not bruise/bleed easily.  Psychiatric/Behavioral: Negative for behavioral problems, confusion, sleep disturbance, dysphoric mood, decreased concentration and agitation.    Physical Exam   BP 122/72   Pulse (!) 56   Temp 98.2 F (36.8 C)   Ht 5\' 6"  (1.676 m)   Wt 182 lb (82.6 kg)   BMI 29.38 kg/m   Physical Exam  Constitutional:  oriented to person, place, and time. appears well-developed and well-nourished. No distress.  HENT: [place]/AT, PERRLA, no scleral icterus Mouth/Throat: Oropharynx is clear and moist. No oropharyngeal exudate.  Cardiovascular: Normal rate, regular rhythm and normal heart sounds. Exam reveals no gallop and no friction rub.  No murmur heard.  Pulmonary/Chest: Effort normal and breath sounds normal. No respiratory distress.  has no wheezes.  Neck = supple, no nuchal rigidity Abdominal: Soft. Bowel sounds are normal.  exhibits no distension. There is no tenderness.  Lymphadenopathy: no cervical adenopathy. No axillary adenopathy Neurological: alert and oriented to person, place, and time.  Skin: Skin is warm and dry. No rash noted. No erythema.  Psychiatric: a normal mood and affect.  behavior is normal.   Lab Results  Component Value Date   CD4TCELL 14 (L) 08/30/2017   Lab Results  Component Value Date   CD4TABS 340 (L) 08/30/2017   CD4TABS 480 11/23/2016   CD4TABS 530 06/01/2016   Lab Results  Component Value Date   HIV1RNAQUANT <20 NOT DETECTED 08/30/2017   Lab Results  Component Value Date   HEPBSAB REACTIVE (A) 05/18/2017   Lab Results  Component Value Date   LABRPR NON-REACTIVE  05/18/2017    CBC Lab Results  Component Value Date   WBC 9.1 11/23/2016   RBC 5.08 11/23/2016   HGB 14.0 11/23/2016   HCT 42.8 11/23/2016   PLT 293 11/23/2016   MCV 84.3 11/23/2016   MCH 27.6 11/23/2016   MCHC 32.7 11/23/2016   RDW 12.3 11/23/2016   LYMPHSABS 3,358 11/23/2016   MONOABS 1,304 (H) 06/01/2016   EOSABS 501 (H) 11/23/2016    BMET Lab Results  Component Value Date   NA 139 05/18/2017   K 4.0 05/18/2017   CL 103 05/18/2017   CO2 23 05/18/2017   GLUCOSE 114 (H) 05/18/2017   BUN 12 05/18/2017   CREATININE 0.97 05/18/2017   CALCIUM 9.5 05/18/2017   GFRNONAA 70 11/23/2016   GFRAA 81 11/23/2016      Assessment and Plan  hiv disease= well controlled. Continue on tivicay and descovy. Will check her labs at her next visit in 3 months  Long term medication management = cr is stable. No need to change medication  htn = well controlled on losartan  Health maintenance = will offer her to get prevnar 13 today  Gave letter of support to postpone her jury duty commitment

## 2017-10-06 ENCOUNTER — Telehealth: Payer: Self-pay | Admitting: *Deleted

## 2017-10-06 NOTE — Telephone Encounter (Signed)
Courtney Hamilton called this am and left a message - but message cut off before she finished sentence.

## 2017-10-08 NOTE — Telephone Encounter (Signed)
I called Courtney Hamilton and explained I got her message but it cut off before she finished talking. She states she was texted by pharmacy she has a rx for flagyl and wanted to know what it was for. I explained she tested + for BV and flagyl will treat that . Instructed to avoid alcohol while on flagyl and to take with food. She voices understanding.

## 2017-10-11 ENCOUNTER — Ambulatory Visit: Payer: Medicare HMO | Admitting: Obstetrics and Gynecology

## 2017-10-11 ENCOUNTER — Telehealth: Payer: Self-pay | Admitting: Family Medicine

## 2017-10-11 NOTE — Telephone Encounter (Signed)
Patient called to say she was not feeling good, and needed to reschedule her appointment. She scheduled for Dr Rosana Hoes next available appointment.

## 2017-10-14 MED FILL — TIVICAY 50 MG TABLET: 50 | 30 days supply | Qty: 30 | Fill #1

## 2017-10-14 MED FILL — DESCOVY 200-25 MG TABS: 200-25 | 30 days supply | Qty: 30 | Fill #1

## 2017-11-20 ENCOUNTER — Other Ambulatory Visit: Payer: Self-pay | Admitting: Physical Medicine & Rehabilitation

## 2017-11-26 MED FILL — TIVICAY 50 MG TABLET: 50 | 30 days supply | Qty: 30 | Fill #2

## 2017-11-26 MED FILL — DESCOVY 200-25 MG TABS: 200-25 | 30 days supply | Qty: 30 | Fill #2

## 2017-11-29 ENCOUNTER — Encounter: Payer: Medicare HMO | Admitting: Registered Nurse

## 2017-12-01 ENCOUNTER — Encounter: Payer: Medicare HMO | Admitting: Registered Nurse

## 2017-12-03 ENCOUNTER — Ambulatory Visit: Payer: Medicare HMO | Admitting: Obstetrics and Gynecology

## 2017-12-03 ENCOUNTER — Encounter: Payer: Medicare HMO | Admitting: Registered Nurse

## 2017-12-03 ENCOUNTER — Other Ambulatory Visit (HOSPITAL_COMMUNITY)
Admission: RE | Admit: 2017-12-03 | Discharge: 2017-12-03 | Disposition: A | Discharge status: Home / Self Care | Payer: Medicare HMO | Source: Ambulatory Visit | Attending: Obstetrics and Gynecology | Admitting: Obstetrics and Gynecology

## 2017-12-03 ENCOUNTER — Encounter: Payer: Self-pay | Admitting: Obstetrics and Gynecology

## 2017-12-03 VITALS — BP 133/91 | HR 52 | Ht 67.0 in | Wt 185.6 lb

## 2017-12-03 DIAGNOSIS — R69 Illness, unspecified: Secondary | ICD-10-CM | POA: Diagnosis not present

## 2017-12-03 DIAGNOSIS — N9089 Other specified noninflammatory disorders of vulva and perineum: Secondary | ICD-10-CM | POA: Diagnosis present

## 2017-12-03 DIAGNOSIS — N762 Acute vulvitis: Secondary | ICD-10-CM | POA: Diagnosis not present

## 2017-12-03 DIAGNOSIS — L439 Lichen planus, unspecified: Secondary | ICD-10-CM | POA: Diagnosis not present

## 2017-12-03 DIAGNOSIS — D071 Carcinoma in situ of vulva: Secondary | ICD-10-CM | POA: Diagnosis not present

## 2017-12-03 NOTE — Progress Notes (Signed)
VULVAR BIOPSY NOTE  Season Astacio [id number]  12/03/2017  Indication for biopsy: skin changes in HIV patient,  1. White changes in 4 mm patch at mons cleft directly superior to clitoris 2. White raised plaque approx 5 mm in diameter at right labia majora 3. Hyperpigmented 1 cm diameter lesion, raised plaque at right vulva 4. Crack in skin at right groin fold at level of perineum with appearance of yeast 5. Clitoris under hood with appearance of yeast  The indications for vulvar biopsy were reviewed. Risks of the biopsy including pain, bleeding, infection, inadequate specimen, and need for additional procedures were discussed. The patient stated understanding and agreed to undergo procedure today. Consent was signed, and an adequate time out performed.   The patient's vulva was prepped with Betadine. 1% lidocaine was injected into the areas by the mons cleft, right labia and right vulva. A 3-mm punch biopsy was done at the mons cleft and right labia, a 4-mm punch biopsy was taken at right vulva, biopsy tissue was picked up with sterile forceps and sterile scissors were used to excise each lesion.  Small bleeding was noted and hemostasis was achieved using pressure.    The patient tolerated the procedure well.   Specimens: 12 o'clock mons, right labia, right vulva  Post-procedure instructions were given to the patient. The patient is to call with heavy bleeding, fever greater than 100.4, foul smelling vaginal discharge or other concerns. The patient will be return to clinic in two weeks for discussion of results.   Feliz Beam, M.D. Attending [place], [facility] for [facility], [place]

## 2017-12-17 ENCOUNTER — Telehealth: Payer: Self-pay | Admitting: Obstetrics and Gynecology

## 2017-12-17 NOTE — Telephone Encounter (Signed)
Left message for patient to call regarding vulvar biopsy results. Will try again.   Feliz Beam, M.D. [facility]

## 2017-12-21 ENCOUNTER — Telehealth: Payer: Self-pay | Admitting: Obstetrics and Gynecology

## 2017-12-21 ENCOUNTER — Telehealth: Payer: Self-pay

## 2017-12-21 DIAGNOSIS — D071 Carcinoma in situ of vulva: Secondary | ICD-10-CM

## 2017-12-21 NOTE — Telephone Encounter (Signed)
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

## 2017-12-21 NOTE — Telephone Encounter (Signed)
Reviewed results of biopsy, VIN-I, VIN-III and need for follow up with Gyn Oncology. Reviewed importance of following up and likely removal. Patient verbalizes understanding, will attend appt with Gyn Onc. Answered all questions.    Feliz Beam, M.D. [facility]

## 2017-12-22 ENCOUNTER — Ambulatory Visit: Payer: Medicare HMO | Admitting: Family Medicine

## 2017-12-22 NOTE — Telephone Encounter (Addendum)
LM with Seth Bake, [phone] to return call in regards to scheduling an appt for GYN ONC.

## 2017-12-23 ENCOUNTER — Other Ambulatory Visit: Payer: Self-pay | Admitting: Physical Medicine & Rehabilitation

## 2017-12-23 NOTE — Telephone Encounter (Signed)
Recieved electronic medication refill request for gabapentin, last mention of this medication was from January of 2019, unsure if ok to continue this medication.  Please advise.

## 2017-12-23 NOTE — Telephone Encounter (Addendum)
I called GYN Onc again twice and heard  a message call not completed at this time. I called [phone] and spoke with a nurse. She said they are reviewing the pathology because there is a discrepancy. States they will call us back tomorrow and leave a message on our nurse voicemail.

## 2017-12-27 ENCOUNTER — Other Ambulatory Visit: Payer: Self-pay

## 2017-12-27 ENCOUNTER — Other Ambulatory Visit: Payer: Medicare HMO

## 2017-12-27 ENCOUNTER — Telehealth: Payer: Self-pay | Admitting: *Deleted

## 2017-12-27 DIAGNOSIS — Z79899 Other long term (current) drug therapy: Secondary | ICD-10-CM

## 2017-12-27 DIAGNOSIS — Z113 Encounter for screening for infections with a predominantly sexual mode of transmission: Secondary | ICD-10-CM

## 2017-12-27 DIAGNOSIS — B20 Human immunodeficiency virus [HIV] disease: Secondary | ICD-10-CM

## 2017-12-27 MED FILL — TIVICAY 50 MG TABLET: 50 | 30 days supply | Qty: 30 | Fill #3

## 2017-12-27 MED FILL — DESCOVY 200-25 MG TABS: 200-25 | 30 days supply | Qty: 30 | Fill #3

## 2017-12-27 NOTE — Telephone Encounter (Signed)
Called and spoke with the patient. Scheduled appt for tomorrow and gave instructions.

## 2017-12-27 NOTE — Telephone Encounter (Signed)
Per message notified by [facility] they have made appointment for tomorrow and notified patient.

## 2017-12-28 ENCOUNTER — Encounter: Payer: Self-pay | Admitting: Gynecology

## 2017-12-28 ENCOUNTER — Inpatient Hospital Stay: Payer: Medicare HMO | Attending: Gynecology | Admitting: Gynecology

## 2017-12-28 VITALS — BP 124/70 | HR 56 | Temp 98.4°F | Resp 20 | Ht 67.0 in | Wt 183.0 lb

## 2017-12-28 DIAGNOSIS — L439 Lichen planus, unspecified: Secondary | ICD-10-CM | POA: Insufficient documentation

## 2017-12-28 DIAGNOSIS — D071 Carcinoma in situ of vulva: Secondary | ICD-10-CM | POA: Diagnosis not present

## 2017-12-28 DIAGNOSIS — Z87891 Personal history of nicotine dependence: Secondary | ICD-10-CM | POA: Diagnosis not present

## 2017-12-28 DIAGNOSIS — R69 Illness, unspecified: Secondary | ICD-10-CM | POA: Diagnosis not present

## 2017-12-28 DIAGNOSIS — Z21 Asymptomatic human immunodeficiency virus [HIV] infection status: Secondary | ICD-10-CM | POA: Insufficient documentation

## 2017-12-28 DIAGNOSIS — A630 Anogenital (venereal) warts: Secondary | ICD-10-CM | POA: Diagnosis not present

## 2017-12-28 NOTE — Progress Notes (Signed)
Consult Note: Gyn-Onc   Courtney Hamilton 57 y.o. female  Chief Complaint  Patient presents with  . 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  Lichen planus of clitoral hood.  Plan: Treatment options for VIN 1 (condylomata) and VIN 3 were discussed with the patient.  I would recommend either excision or laser vaporization.  After discussion the pros and cons of each the patient has opted for excision with primary closure.  We will schedule this on a Tuesday when I am back in [place] as an outpatient.  With regard to the lichen planus, the patient is essentially asymptomatic but could be treated with clobetasol.  (HIV positive)  HPI: 57 year old seen in consultation the request of Dr. Vivien Rota regarding management of newly diagnosed VIN 1, VIN 3, and lichen planus of the vulva.  The patient reports that she has had vulvar pruritus for many years.  She also has some burning in the left posterior vulva.  She denies any past gynecologic history except for a remote history 2 decades ago with cryotherapy for an abnormal Pap smear.  The patient is menopausal..  She had a recent Pap smear that was normal.  Dr. 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  Review of Systems:10 point review of systems is negative except as noted in interval history.   Vitals: Blood pressure 124/70, pulse (!) 56, temperature 98.4 F (36.9 C), temperature source Oral, resp. rate 20, height 5\' 7"  (1.702 m), weight 183 lb (83 kg), SpO2 99 %.  Physical Exam: General : The patient is a healthy woman in no acute distress.  HEENT: normocephalic, extraoccular movements normal; neck is supple without thyromegally  Lynphnodes: Supraclavicular and inguinal nodes not enlarged  Abdomen: Soft, non-tender, no ascites, no  organomegally, no masses, no hernias  Pelvic:  EGBUS: Normal female.  The clitoral hood there is a white flattened lesion which on biopsy is lichen planus.  On the right vulva there is a raised white 3 mm lesion consistent with a condylomatous (VIN-I) as well as a hyperpigmented area measuring approximately 1.5 cm on the right labia majora which has been biopsied and shows VIN 3.  The patient also points out an area of "burning" on the left vulva which grossly appears normal.  Lower extremities: No edema or varicosities. Normal range of motion.  The patient has a hyperpigmented lesion on her right inner thigh measuring approximately 2 x 3 cm.     Allergies  Allergen Reactions  . Baclofen Other (See Comments)    Perioral paresthesia  . Bactrim [Sulfamethoxazole-Trimethoprim] Rash  . Doxycycline Rash    Past Medical History:  Diagnosis Date  . Herniated disc 10/07/2011  . HIV (human immunodeficiency virus infection) ([place])   . Lower GI bleed 05/10/2012  . Osteoporosis   . Ruptured lumbar disc   . Sciatica   . 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 TB (pulmonary tuberculosis) 1993    exposure, treated     Past Surgical History:  Procedure Laterality Date  . APPENDECTOMY    . BREAST EXCISIONAL BIOPSY Left 2001 per pt  . CHOLECYSTECTOMY    . ECTOPIC PREGNANCY SURGERY    . SALIVARY GLAND SURGERY      Current Outpatient  Medications  Medication Sig Dispense Refill  . albuterol (VENTOLIN HFA) 108 (90 Base) MCG/ACT inhaler Inhale 2 puffs into the lungs every 6 (six) hours as needed for wheezing or shortness of breath. 1 Inhaler 5  . Biotin 5000 MCG CAPS Take 5,000 mcg by mouth daily.    . Calcium-Magnesium-Zinc 167-83-8 MG TABS Take 1 tablet by mouth daily.    . Cyanocobalamin 1000 MCG SUBL Place 1,000 mcg under the tongue daily.     . DESCOVY 200-25 MG tablet TAKE 1 TABLET BY MOUTH DAILY. 90 tablet 2  . gabapentin (NEURONTIN) 300 MG capsule TAKE 1 CAPSULE  BY MOUTH THREE TIMES A DAY 270 capsule 1  . losartan (COZAAR) 25 MG tablet Take 1 tablet (25 mg total) by mouth daily. 30 tablet 11  . nystatin cream (MYCOSTATIN) Apply 1 application topically 2 (two) times daily. 30 g 1  . TIVICAY 50 MG tablet TAKE 1 TABLET (50 MG TOTAL) BY MOUTH DAILY. 90 tablet 2  . traMADol (ULTRAM) 50 MG tablet Take 2 tablets (100 mg total) by mouth 3 (three) times daily as needed for moderate pain. 180 tablet 5  . vitamin C (ASCORBIC ACID) 500 MG tablet Take 500 mg by mouth daily.     No current facility-administered medications for this visit.     Social History   Socioeconomic History  . Marital status: Married    Spouse name: Not on file  . Number of children: Not on file  . Years of education: Not on file  . Highest education level: Not on file  Occupational History  . Not on file  Social Needs  . Financial resource strain: Not on file  . Food insecurity:    Worry: Not on file    Inability: Not on file  . Transportation needs:    Medical: Not on file    Non-medical: Not on file  Tobacco Use  . Smoking status: Former Smoker    Packs/day: 0.50    Years: 30.00    Pack years: 15.00    Types: Cigarettes  . Smokeless tobacco: Never Used  . Tobacco comment: Patient is not smoking anymore  Substance and Sexual Activity  . Alcohol use: No  . Drug use: No    Comment: past history of cocaine  and alcohol    . Sexual activity: Yes    Partners: Male    Comment: CONDOMS GIVEN  Lifestyle  . Physical activity:    Days per week: Not on file    Minutes per session: Not on file  . Stress: Not on file  Relationships  . Social connections:    Talks on phone: Not on file    Gets together: Not on file    Attends religious service: Not on file    Active member of club or organization: Not on file    Attends meetings of clubs or organizations: Not on file    Relationship status: Not on file  . Intimate partner violence:    Fear of current or ex partner: Not  on file    Emotionally abused: Not on file    Physically abused: Not on file    Forced sexual activity: Not on file  Other Topics Concern  . Not on file  Social History Narrative  . Not on file    Family History  Problem Relation Age of Onset  . Asthma Father   . COPD Father   . Cancer Mother   . Diabetes Brother   .  Hypertension Brother   . Diabetes Brother   . Breast cancer Sister   . Colon cancer Maternal Uncle       [name], MD 12/28/2017, 9:22 AM      Consult Note: Gyn-Onc   Courtney Hamilton 57 y.o. female  Chief Complaint  Patient presents with  . VIN III (vulvar intraepithelial neoplasia III)    Assessment :  Plan:  Interval History:   HPI:  Review of Systems:10 point review of systems is negative except as noted in interval history.   Vitals: Blood pressure 124/70, pulse (!) 56, temperature 98.4 F (36.9 C), temperature source Oral, resp. rate 20, height 5\' 7"  (1.702 m), weight 183 lb (83 kg), SpO2 99 %.  Physical Exam: General : The patient is a healthy woman in no acute distress.  HEENT: normocephalic, extraoccular movements normal; neck is supple without thyromegally  Lynphnodes: Supraclavicular and inguinal nodes not enlarged  Abdomen: Soft, non-tender, no ascites, no organomegally, no masses, no hernias  Pelvic:  EGBUS: Normal female  Vagina: Normal, no lesions  Urethra and Bladder: Normal, non-tender  Cervix: Surgically absent  Uterus: Surgically absent  Bi-manual examination: Non-tender; no adenxal masses or nodularity  Rectal: normal sphincter tone, no masses, no blood  Lower extremities: No edema or varicosities. Normal range of motion      Allergies  Allergen Reactions  . Baclofen Other (See Comments)    Perioral paresthesia  . Bactrim [Sulfamethoxazole-Trimethoprim] Rash  . Doxycycline Rash    Past Medical History:  Diagnosis Date  . Herniated disc 10/07/2011  . HIV (human immunodeficiency virus infection)  ([place])   . Lower GI bleed 05/10/2012  . Osteoporosis   . Ruptured lumbar disc   . Sciatica   . 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 TB (pulmonary tuberculosis) 1993    exposure, treated     Past Surgical History:  Procedure Laterality Date  . APPENDECTOMY    . BREAST EXCISIONAL BIOPSY Left 2001 per pt  . CHOLECYSTECTOMY    . ECTOPIC PREGNANCY SURGERY    . SALIVARY GLAND SURGERY      Current Outpatient Medications  Medication Sig Dispense Refill  . albuterol (VENTOLIN HFA) 108 (90 Base) MCG/ACT inhaler Inhale 2 puffs into the lungs every 6 (six) hours as needed for wheezing or shortness of breath. 1 Inhaler 5  . Biotin 5000 MCG CAPS Take 5,000 mcg by mouth daily.    . Calcium-Magnesium-Zinc 167-83-8 MG TABS Take 1 tablet by mouth daily.    . Cyanocobalamin 1000 MCG SUBL Place 1,000 mcg under the tongue daily.     . DESCOVY 200-25 MG tablet TAKE 1 TABLET BY MOUTH DAILY. 90 tablet 2  . gabapentin (NEURONTIN) 300 MG capsule TAKE 1 CAPSULE BY MOUTH THREE TIMES A DAY 270 capsule 1  . losartan (COZAAR) 25 MG tablet Take 1 tablet (25 mg total) by mouth daily. 30 tablet 11  . nystatin cream (MYCOSTATIN) Apply 1 application topically 2 (two) times daily. 30 g 1  . TIVICAY 50 MG tablet TAKE 1 TABLET (50 MG TOTAL) BY MOUTH DAILY. 90 tablet 2  . traMADol (ULTRAM) 50 MG tablet Take 2 tablets (100 mg total) by mouth 3 (three) times daily as needed for moderate pain. 180 tablet 5  . vitamin C (ASCORBIC ACID) 500 MG tablet Take 500 mg by mouth daily.     No current facility-administered medications for this visit.     Social  History   Socioeconomic History  . Marital status: Married    Spouse name: Not on file  . Number of children: Not on file  . Years of education: Not on file  . Highest education level: Not on file  Occupational History  . Not on file  Social Needs  . Financial resource strain: Not on file  . Food insecurity:    Worry: Not on file     Inability: Not on file  . Transportation needs:    Medical: Not on file    Non-medical: Not on file  Tobacco Use  . Smoking status: Former Smoker    Packs/day: 0.50    Years: 30.00    Pack years: 15.00    Types: Cigarettes  . Smokeless tobacco: Never Used  . Tobacco comment: Patient is not smoking anymore  Substance and Sexual Activity  . Alcohol use: No  . Drug use: No    Comment: past history of cocaine  and alcohol    . Sexual activity: Yes    Partners: Male    Comment: CONDOMS GIVEN  Lifestyle  . Physical activity:    Days per week: Not on file    Minutes per session: Not on file  . Stress: Not on file  Relationships  . Social connections:    Talks on phone: Not on file    Gets together: Not on file    Attends religious service: Not on file    Active member of club or organization: Not on file    Attends meetings of clubs or organizations: Not on file    Relationship status: Not on file  . Intimate partner violence:    Fear of current or ex partner: Not on file    Emotionally abused: Not on file    Physically abused: Not on file    Forced sexual activity: Not on file  Other Topics Concern  . Not on file  Social History Narrative  . Not on file    Family History  Problem Relation Age of Onset  . Asthma Father   . COPD Father   . Cancer Mother   . Diabetes Brother   . Hypertension Brother   . Diabetes Brother   . Breast cancer Sister   . Colon cancer Maternal Uncle       [name], MD 12/28/2017, 9:22 AM

## 2017-12-28 NOTE — H&P (View-Only) (Signed)
Consult Note: Gyn-Onc   Courtney Hamilton 57 y.o. female  Chief Complaint  Patient presents with  . 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  Lichen planus of clitoral hood.  Plan: Treatment options for VIN 1 (condylomata) and VIN 3 were discussed with the patient.  I would recommend either excision or laser vaporization.  After discussion the pros and cons of each the patient has opted for excision with primary closure.  We will schedule this on a Tuesday when I am back in [place] as an outpatient.  With regard to the lichen planus, the patient is essentially asymptomatic but could be treated with clobetasol.  (HIV positive)  HPI: 57 year old seen in consultation the request of Dr. Vivien Rota regarding management of newly diagnosed VIN 1, VIN 3, and lichen planus of the vulva.  The patient reports that she has had vulvar pruritus for many years.  She also has some burning in the left posterior vulva.  She denies any past gynecologic history except for a remote history 2 decades ago with cryotherapy for an abnormal Pap smear.  The patient is menopausal..  She had a recent Pap smear that was normal.  Dr. 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  Review of Systems:10 point review of systems is negative except as noted in interval history.   Vitals: Blood pressure 124/70, pulse (!) 56, temperature 98.4 F (36.9 C), temperature source Oral, resp. rate 20, height 5\' 7"  (1.702 m), weight 183 lb (83 kg), SpO2 99 %.  Physical Exam: General : The patient is a healthy woman in no acute distress.  HEENT: normocephalic, extraoccular movements normal; neck is supple without thyromegally  Lynphnodes: Supraclavicular and inguinal nodes not enlarged  Abdomen: Soft, non-tender, no ascites, no  organomegally, no masses, no hernias  Pelvic:  EGBUS: Normal female.  The clitoral hood there is a white flattened lesion which on biopsy is lichen planus.  On the right vulva there is a raised white 3 mm lesion consistent with a condylomatous (VIN-I) as well as a hyperpigmented area measuring approximately 1.5 cm on the right labia majora which has been biopsied and shows VIN 3.  The patient also points out an area of "burning" on the left vulva which grossly appears normal.  Lower extremities: No edema or varicosities. Normal range of motion.  The patient has a hyperpigmented lesion on her right inner thigh measuring approximately 2 x 3 cm.     Allergies  Allergen Reactions  . Baclofen Other (See Comments)    Perioral paresthesia  . Bactrim [Sulfamethoxazole-Trimethoprim] Rash  . Doxycycline Rash    Past Medical History:  Diagnosis Date  . Herniated disc 10/07/2011  . HIV (human immunodeficiency virus infection) ([place])   . Lower GI bleed 05/10/2012  . Osteoporosis   . Ruptured lumbar disc   . Sciatica   . 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 TB (pulmonary tuberculosis) 1993    exposure, treated     Past Surgical History:  Procedure Laterality Date  . APPENDECTOMY    . BREAST EXCISIONAL BIOPSY Left 2001 per pt  . CHOLECYSTECTOMY    . ECTOPIC PREGNANCY SURGERY    . SALIVARY GLAND SURGERY      Current Outpatient  Medications  Medication Sig Dispense Refill  . albuterol (VENTOLIN HFA) 108 (90 Base) MCG/ACT inhaler Inhale 2 puffs into the lungs every 6 (six) hours as needed for wheezing or shortness of breath. 1 Inhaler 5  . Biotin 5000 MCG CAPS Take 5,000 mcg by mouth daily.    . Calcium-Magnesium-Zinc 167-83-8 MG TABS Take 1 tablet by mouth daily.    . Cyanocobalamin 1000 MCG SUBL Place 1,000 mcg under the tongue daily.     . DESCOVY 200-25 MG tablet TAKE 1 TABLET BY MOUTH DAILY. 90 tablet 2  . gabapentin (NEURONTIN) 300 MG capsule TAKE 1 CAPSULE  BY MOUTH THREE TIMES A DAY 270 capsule 1  . losartan (COZAAR) 25 MG tablet Take 1 tablet (25 mg total) by mouth daily. 30 tablet 11  . nystatin cream (MYCOSTATIN) Apply 1 application topically 2 (two) times daily. 30 g 1  . TIVICAY 50 MG tablet TAKE 1 TABLET (50 MG TOTAL) BY MOUTH DAILY. 90 tablet 2  . traMADol (ULTRAM) 50 MG tablet Take 2 tablets (100 mg total) by mouth 3 (three) times daily as needed for moderate pain. 180 tablet 5  . vitamin C (ASCORBIC ACID) 500 MG tablet Take 500 mg by mouth daily.     No current facility-administered medications for this visit.     Social History   Socioeconomic History  . Marital status: Married    Spouse name: Not on file  . Number of children: Not on file  . Years of education: Not on file  . Highest education level: Not on file  Occupational History  . Not on file  Social Needs  . Financial resource strain: Not on file  . Food insecurity:    Worry: Not on file    Inability: Not on file  . Transportation needs:    Medical: Not on file    Non-medical: Not on file  Tobacco Use  . Smoking status: Former Smoker    Packs/day: 0.50    Years: 30.00    Pack years: 15.00    Types: Cigarettes  . Smokeless tobacco: Never Used  . Tobacco comment: Patient is not smoking anymore  Substance and Sexual Activity  . Alcohol use: No  . Drug use: No    Comment: past history of cocaine  and alcohol    . Sexual activity: Yes    Partners: Male    Comment: CONDOMS GIVEN  Lifestyle  . Physical activity:    Days per week: Not on file    Minutes per session: Not on file  . Stress: Not on file  Relationships  . Social connections:    Talks on phone: Not on file    Gets together: Not on file    Attends religious service: Not on file    Active member of club or organization: Not on file    Attends meetings of clubs or organizations: Not on file    Relationship status: Not on file  . Intimate partner violence:    Fear of current or ex partner: Not  on file    Emotionally abused: Not on file    Physically abused: Not on file    Forced sexual activity: Not on file  Other Topics Concern  . Not on file  Social History Narrative  . Not on file    Family History  Problem Relation Age of Onset  . Asthma Father   . COPD Father   . Cancer Mother   . Diabetes Brother   .  Hypertension Brother   . Diabetes Brother   . Breast cancer Sister   . Colon cancer Maternal Uncle       [name], MD 12/28/2017, 9:22 AM      Consult Note: Gyn-Onc   Courtney Hamilton 57 y.o. female  Chief Complaint  Patient presents with  . VIN III (vulvar intraepithelial neoplasia III)    Assessment :  Plan:  Interval History:   HPI:  Review of Systems:10 point review of systems is negative except as noted in interval history.   Vitals: Blood pressure 124/70, pulse (!) 56, temperature 98.4 F (36.9 C), temperature source Oral, resp. rate 20, height 5\' 7"  (1.702 m), weight 183 lb (83 kg), SpO2 99 %.  Physical Exam: General : The patient is a healthy woman in no acute distress.  HEENT: normocephalic, extraoccular movements normal; neck is supple without thyromegally  Lynphnodes: Supraclavicular and inguinal nodes not enlarged  Abdomen: Soft, non-tender, no ascites, no organomegally, no masses, no hernias  Pelvic:  EGBUS: Normal female  Vagina: Normal, no lesions  Urethra and Bladder: Normal, non-tender  Cervix: Surgically absent  Uterus: Surgically absent  Bi-manual examination: Non-tender; no adenxal masses or nodularity  Rectal: normal sphincter tone, no masses, no blood  Lower extremities: No edema or varicosities. Normal range of motion      Allergies  Allergen Reactions  . Baclofen Other (See Comments)    Perioral paresthesia  . Bactrim [Sulfamethoxazole-Trimethoprim] Rash  . Doxycycline Rash    Past Medical History:  Diagnosis Date  . Herniated disc 10/07/2011  . HIV (human immunodeficiency virus infection)  ([place])   . Lower GI bleed 05/10/2012  . Osteoporosis   . Ruptured lumbar disc   . Sciatica   . 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 TB (pulmonary tuberculosis) 1993    exposure, treated     Past Surgical History:  Procedure Laterality Date  . APPENDECTOMY    . BREAST EXCISIONAL BIOPSY Left 2001 per pt  . CHOLECYSTECTOMY    . ECTOPIC PREGNANCY SURGERY    . SALIVARY GLAND SURGERY      Current Outpatient Medications  Medication Sig Dispense Refill  . albuterol (VENTOLIN HFA) 108 (90 Base) MCG/ACT inhaler Inhale 2 puffs into the lungs every 6 (six) hours as needed for wheezing or shortness of breath. 1 Inhaler 5  . Biotin 5000 MCG CAPS Take 5,000 mcg by mouth daily.    . Calcium-Magnesium-Zinc 167-83-8 MG TABS Take 1 tablet by mouth daily.    . Cyanocobalamin 1000 MCG SUBL Place 1,000 mcg under the tongue daily.     . DESCOVY 200-25 MG tablet TAKE 1 TABLET BY MOUTH DAILY. 90 tablet 2  . gabapentin (NEURONTIN) 300 MG capsule TAKE 1 CAPSULE BY MOUTH THREE TIMES A DAY 270 capsule 1  . losartan (COZAAR) 25 MG tablet Take 1 tablet (25 mg total) by mouth daily. 30 tablet 11  . nystatin cream (MYCOSTATIN) Apply 1 application topically 2 (two) times daily. 30 g 1  . TIVICAY 50 MG tablet TAKE 1 TABLET (50 MG TOTAL) BY MOUTH DAILY. 90 tablet 2  . traMADol (ULTRAM) 50 MG tablet Take 2 tablets (100 mg total) by mouth 3 (three) times daily as needed for moderate pain. 180 tablet 5  . vitamin C (ASCORBIC ACID) 500 MG tablet Take 500 mg by mouth daily.     No current facility-administered medications for this visit.     Social  History   Socioeconomic History  . Marital status: Married    Spouse name: Not on file  . Number of children: Not on file  . Years of education: Not on file  . Highest education level: Not on file  Occupational History  . Not on file  Social Needs  . Financial resource strain: Not on file  . Food insecurity:    Worry: Not on file     Inability: Not on file  . Transportation needs:    Medical: Not on file    Non-medical: Not on file  Tobacco Use  . Smoking status: Former Smoker    Packs/day: 0.50    Years: 30.00    Pack years: 15.00    Types: Cigarettes  . Smokeless tobacco: Never Used  . Tobacco comment: Patient is not smoking anymore  Substance and Sexual Activity  . Alcohol use: No  . Drug use: No    Comment: past history of cocaine  and alcohol    . Sexual activity: Yes    Partners: Male    Comment: CONDOMS GIVEN  Lifestyle  . Physical activity:    Days per week: Not on file    Minutes per session: Not on file  . Stress: Not on file  Relationships  . Social connections:    Talks on phone: Not on file    Gets together: Not on file    Attends religious service: Not on file    Active member of club or organization: Not on file    Attends meetings of clubs or organizations: Not on file    Relationship status: Not on file  . Intimate partner violence:    Fear of current or ex partner: Not on file    Emotionally abused: Not on file    Physically abused: Not on file    Forced sexual activity: Not on file  Other Topics Concern  . Not on file  Social History Narrative  . Not on file    Family History  Problem Relation Age of Onset  . Asthma Father   . COPD Father   . Cancer Mother   . Diabetes Brother   . Hypertension Brother   . Diabetes Brother   . Breast cancer Sister   . Colon cancer Maternal Uncle       [name], MD 12/28/2017, 9:22 AM

## 2017-12-28 NOTE — Patient Instructions (Signed)
Plan to have a wide local excision or partial vulvectomy at the [facility] on January 18, 2018 with Dr. Fermin Schwab.  You will receive a phone call from the pre-surgical RN to discuss instructions.  Please call for any questions or concerns.   Vulvectomy  Vulvectomy is a surgical procedure to remove all or part of the outer female genital organs (vulva). The vulva includes the outer and inner lips of the vagina and the clitoris. You may need this surgery if you have a cancerous growth in your vulva. There are two types of vulvectomy:  A simple vulvectomy. This is the removal of the entire vulva.  A radical vulvectomy. A radical vulvectomy can be partial or complete. ? A partial radical vulvectomy is when part of the vulva and surrounding deep tissue is removed. ? A complete radical vulvectomy is when the vulva, clitoris, and surrounding deep tissue is removed.  During a radical vulvectomy, some lymph nodes near the vulva may also be removed. Tell a health care provider about:  Any allergies you have.  All medicines you are taking, including vitamins, herbs, eye drops, creams, and over-the-counter medicines.  Any problems you or family members have had with anesthetic medicines.  Any blood disorders you have.  Any surgeries you have had.  Any medical conditions you have.  Whether you are pregnant or may be pregnant. What are the risks? Generally, this is a safe procedure. However, problems may occur, including:  Infection.  Bleeding.  Allergic reactions to medicines.  Damage to other structures or organs.  Urinary tract infections.  Lymphedema. This is when your legs swell after the removal of lymph nodes from your groin area.  Pain or decreased sexual pleasure when having sex.  Long-term vaginal swelling, tightness, numbness, or pain.  A blood clot that may travel to the lung (pulmonary embolism).  What happens before the  procedure?  Follow instructions from your health care provider about eating or drinking restrictions.  Ask your health care provider about: ? Changing or stopping your regular medicines. This is especially important if you are taking diabetes medicines or blood thinners. ? Taking medicines such as aspirin and ibuprofen. These medicines can thin your blood. Do not take these medicines before your procedure if your health care provider instructs you not to.  Ask your health care provider how your surgical site will be marked or identified.  You may be given antibiotic medicine to help prevent infection.  Plan to have someone take you home after the procedure.  If you will be going home right after the procedure, plan to have someone with you for 24 hours. What happens during the procedure?  To reduce your risk of infection: ? Your health care team will wash or sanitize their hands. ? Your skin will be washed with soap.  An IV tube will be inserted into one of your veins.  You will be given one or more of the following: ? A medicine to help you relax (sedative). ? A medicine to make you fall asleep (general anesthetic). ? A medicine that is injected into your spine to numb the area below and slightly above the injection site (spinal anesthetic).  A tube (catheter) may be inserted through the outer opening of your bladder (urethra) to drain urine during and after surgery.  Depending on the type of vulvectomy you are having, your surgeon will make an incision and remove the affected area. This may include: ? Removing the entire vulva. ?  Removing part of the vulva, surrounding deep tissue, and lymph nodes. ? Removing the vulva, clitoris, surrounding deep tissue, and lymph nodes.  The procedure may vary among health care providers and hospitals. What happens after the procedure?  Your blood pressure, heart rate, breathing rate, and blood oxygen level will be monitored often until the  medicines you were given have worn off.  You will get medicine for pain as needed.  You may get medicine to prevent constipation.  You may be on a liquid diet at first, and then switch to a regular diet.  When you are taking fluids well, your IV will be removed.  If your catheter was left in place after surgery, it will be removed when your health care provider approves.  You will be asked to breathe deeply and to get out of bed and walk as soon as you can. This information is not intended to replace advice given to you by your health care provider. Make sure you discuss any questions you have with your health care provider. Document Released: 03/22/2015 Document Revised: 08/01/2015 Document Reviewed: 02/18/2015 Elsevier Interactive Patient Education  [employer].

## 2017-12-31 ENCOUNTER — Telehealth: Payer: Self-pay | Admitting: *Deleted

## 2017-12-31 ENCOUNTER — Telehealth: Payer: Self-pay | Admitting: General Practice

## 2017-12-31 ENCOUNTER — Ambulatory Visit: Payer: Medicare HMO | Admitting: Physical Medicine & Rehabilitation

## 2017-12-31 ENCOUNTER — Encounter: Payer: Medicare HMO | Attending: Physical Medicine & Rehabilitation

## 2017-12-31 NOTE — Telephone Encounter (Signed)
Received fax from [facility].  They have noticed that patient has multiple rescue inhaler fills without filling a controller med in the last 180 days.  They are requesting a controller med to be called in if appropriate. Fleeger, Salome Spotted, CMA

## 2017-12-31 NOTE — Telephone Encounter (Signed)
It looks like Courtney Hamilton has an appointment with Dr. Nori Riis next week, so it would be best for her to speak with Dr. Nori Riis about her inhalers.  I have not assessed Courtney Hamilton pulmonary system recently, so she would benefit from Dr. Verlon Au assessment.

## 2017-12-31 NOTE — Telephone Encounter (Signed)
Patient called and left message asking for Dr Rosana Hoes to cal her back. Called patient, no answer- left message stating we are trying to reach you to return your phone call, please call us back if you still need assistance.

## 2018-01-05 ENCOUNTER — Ambulatory Visit (INDEPENDENT_AMBULATORY_CARE_PROVIDER_SITE_OTHER): Payer: Medicare HMO | Admitting: Family Medicine

## 2018-01-05 ENCOUNTER — Other Ambulatory Visit: Payer: Self-pay

## 2018-01-05 ENCOUNTER — Encounter: Payer: Self-pay | Admitting: Family Medicine

## 2018-01-05 VITALS — BP 104/68 | HR 73 | Temp 98.3°F | Ht 67.0 in | Wt 182.8 lb

## 2018-01-05 DIAGNOSIS — I10 Essential (primary) hypertension: Secondary | ICD-10-CM | POA: Diagnosis not present

## 2018-01-05 DIAGNOSIS — Z23 Encounter for immunization: Secondary | ICD-10-CM | POA: Diagnosis not present

## 2018-01-05 DIAGNOSIS — G479 Sleep disorder, unspecified: Secondary | ICD-10-CM | POA: Diagnosis not present

## 2018-01-05 NOTE — Patient Instructions (Signed)
Let us change your gabapentin dose to 1 tablet in the morning and 2 at night.  Take these every day for the next couple of weeks whether you are having increased pain or not.  I think it will help with your sleep.  Getting better sleep may help with your energy level.  I would recommend you see your regular doctor in the next 2 to 3 weeks for follow-up.  Your blood pressure looks fabulous today.  You please continue on the losartan.  It was nice to meet you!

## 2018-01-05 NOTE — Assessment & Plan Note (Addendum)
Blood pressure control looks excellent. She has not had a BMP since March and it looks like she was scheduled to get that earlier this month.  She was in a hurry today as her sister brought her to clinic so we will leave further evaluation of lab work to her follow-up appointment with her PCP.

## 2018-01-05 NOTE — Progress Notes (Addendum)
    CHIEF COMPLAINT / HPI: Feelings of apprehension in chest discomfort this morning on awakening.  Has had this once or twice before usually related to anxiety.  Feels like her anxiety has been increased recently and she is not sure why.  It is bothering her sleep.  She has difficulty maintaining sleep, awakening multiple times at night.  She has typically been taking gabapentin for some lower extremity pain since she thinks in the past that has helped with her anxiety.  Notably she has decreased the gabapentin because her lower extremity pains have been improved recently. #2.  Follow-up starting losartan for elevated blood pressure.  No problems.  Notes her blood pressure seems to be well controlled that she has had taken recently had a few office visits. #3.  Had a little bit of cough over the last couple of days but was nonproductive.  No associated shortness of breath.  REVIEW OF SYSTEMS: No suicidal or homicidal ideation.  Sleep disturbance as in HPI.  Does not feel overtly depressed.  No anhedonia.  Appetite is stable.  Energy level is a little down but she thinks it is secondary to decreased sleep.  Additional pertinent review of systems please see HPI.  PERTINENT  PMH / PSH: I have reviewed the patient's medications, allergies, past medical and surgical history, smoking status and updated in the EMR as appropriate. 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 GENERAL: Well-developed, well-nourished, no acute distress. CARDIOVASCULAR: Regular rate and rhythm no murmur gallop or rub LUNGS: Clear to auscultation bilaterally, no rales or wheeze. ABDOMEN: Soft positive bowel sounds NEURO: No gross focal neurological deficits. MSK: Movement of extremity x 4. AxO x4. Good eye contact.. No psychomotor retardation or agitation.Appropriate speech fluency and content.  . Asks and answers questions  appropriately. Mood is congruent.     ASSESSMENT / PLAN:  Sleep disorder Unclear if her sleep issues are related to her resurgence and anxiety or related to her recent change in medication.  Recommend she restart gabapentin and we discussed dosing options.  Will change to 600 mg at night and 300 mg in the morning.  We will follow-up with her PCP in 2 to 4 weeks for further evaluation.  If she is not having improvement, could consider SSRI.  Notably, she said there were no recent stressors but on her chart review I saw that she is scheduled for a partial vulvectomy in the next month.  I did not discuss this with her as I did not see it until after she had left.  That certainly could be a stressor. Greater than 50% of our 25 minute office visit was spent in counseling and education regarding these issues.   Essential hypertension Blood pressure control looks excellent. She has not had a BMP since March and it looks like she was scheduled to get that earlier this month.  She was in a hurry today as her sister brought her to clinic so we will leave further evaluation of lab work to her follow-up appointment with her PCP.

## 2018-01-05 NOTE — Assessment & Plan Note (Signed)
Unclear if her sleep issues are related to her resurgence and anxiety or related to her recent change in medication.  Recommend she restart gabapentin and we discussed dosing options.  Will change to 600 mg at night and 300 mg in the morning.  We will follow-up with her PCP in 2 to 4 weeks for further evaluation.  If she is not having improvement, could consider SSRI.  Notably, she said there were no recent stressors but on her chart review I saw that she is scheduled for a partial vulvectomy in the next month.  I did not discuss this with her as I did not see it until after she had left.  That certainly could be a stressor. Greater than 50% of our 25 minute office visit was spent in counseling and education regarding these issues.

## 2018-01-10 ENCOUNTER — Encounter: Payer: Medicare HMO | Admitting: Internal Medicine

## 2018-01-10 ENCOUNTER — Telehealth: Payer: Self-pay | Admitting: *Deleted

## 2018-01-10 NOTE — Telephone Encounter (Signed)
I called Courtney Hamilton back and informed her Dr.Davis not here today so I wanted to see if I could assist her. She states she wanted to know if Dr.Davis still wants her to come Monday since her surgery is Tuesday with gyn/onc. I  Verified she may not and I will send a message to find out and then we will get back to her. She voices understanding.

## 2018-01-10 NOTE — Telephone Encounter (Signed)
Received a voicemail this am stating she has an appointment Monday with Dr.Davis but has surgery Tuesday and needs her to call her.

## 2018-01-11 ENCOUNTER — Other Ambulatory Visit: Payer: Self-pay

## 2018-01-11 ENCOUNTER — Encounter (HOSPITAL_BASED_OUTPATIENT_CLINIC_OR_DEPARTMENT_OTHER): Payer: Self-pay | Admitting: *Deleted

## 2018-01-11 NOTE — Progress Notes (Signed)
Spoke with Jeanifer Npo after midnight food, clear liquids from midnight until 800 am then npo, arrive 1200 pm 01-18-18 [facility] meds to take: albuterol inhaler prn and bring inhaler, gabapentin, descovy, tivicay, tramadol prn Driver husband ahmed cell [phone] Has surgery orders in epic  Need ekg and I stat 4

## 2018-01-12 ENCOUNTER — Telehealth: Payer: Self-pay | Admitting: *Deleted

## 2018-01-12 NOTE — Telephone Encounter (Signed)
-----   Message from Sloan Leiter, MD sent at 01/11/2018  8:54 AM EST ----- Please call and let patient know she does not need to keep appointment with me for 01/17/18 since she has surgery with Gyn Onc scheduled for 01/18/18.

## 2018-01-12 NOTE — Telephone Encounter (Signed)
Called pt to inform her that she did not need to keep her appointment with Dr. Rosana Hoes on 01/17/18.  Pt verbalized understanding.  Will send to front office to cancel appointment

## 2018-01-17 ENCOUNTER — Ambulatory Visit: Payer: Medicare HMO | Admitting: Obstetrics and Gynecology

## 2018-01-18 ENCOUNTER — Ambulatory Visit (HOSPITAL_BASED_OUTPATIENT_CLINIC_OR_DEPARTMENT_OTHER): Payer: Medicare HMO | Admitting: Certified Registered"

## 2018-01-18 ENCOUNTER — Encounter (HOSPITAL_BASED_OUTPATIENT_CLINIC_OR_DEPARTMENT_OTHER)
Admission: RE | Disposition: A | Discharge status: Home / Self Care | Payer: Self-pay | Source: Ambulatory Visit | Attending: Gynecology

## 2018-01-18 ENCOUNTER — Encounter (HOSPITAL_BASED_OUTPATIENT_CLINIC_OR_DEPARTMENT_OTHER): Payer: Self-pay | Admitting: *Deleted

## 2018-01-18 ENCOUNTER — Ambulatory Visit (HOSPITAL_BASED_OUTPATIENT_CLINIC_OR_DEPARTMENT_OTHER)
Admission: RE | Admit: 2018-01-18 | Discharge: 2018-01-18 | Disposition: A | Discharge status: Home / Self Care | Payer: Medicare HMO | Source: Ambulatory Visit | Attending: Gynecology | Admitting: Gynecology

## 2018-01-18 ENCOUNTER — Other Ambulatory Visit: Payer: Self-pay | Admitting: Gynecologic Oncology

## 2018-01-18 ENCOUNTER — Other Ambulatory Visit: Payer: Self-pay

## 2018-01-18 DIAGNOSIS — R69 Illness, unspecified: Secondary | ICD-10-CM | POA: Diagnosis not present

## 2018-01-18 DIAGNOSIS — Z8611 Personal history of tuberculosis: Secondary | ICD-10-CM | POA: Diagnosis not present

## 2018-01-18 DIAGNOSIS — Z87891 Personal history of nicotine dependence: Secondary | ICD-10-CM | POA: Diagnosis not present

## 2018-01-18 DIAGNOSIS — N903 Dysplasia of vulva, unspecified: Secondary | ICD-10-CM | POA: Diagnosis not present

## 2018-01-18 DIAGNOSIS — Z888 Allergy status to other drugs, medicaments and biological substances status: Secondary | ICD-10-CM | POA: Insufficient documentation

## 2018-01-18 DIAGNOSIS — D071 Carcinoma in situ of vulva: Secondary | ICD-10-CM | POA: Insufficient documentation

## 2018-01-18 DIAGNOSIS — Z9889 Other specified postprocedural states: Secondary | ICD-10-CM | POA: Insufficient documentation

## 2018-01-18 DIAGNOSIS — Z79899 Other long term (current) drug therapy: Secondary | ICD-10-CM | POA: Diagnosis not present

## 2018-01-18 DIAGNOSIS — Z21 Asymptomatic human immunodeficiency virus [HIV] infection status: Secondary | ICD-10-CM | POA: Insufficient documentation

## 2018-01-18 DIAGNOSIS — G8918 Other acute postprocedural pain: Secondary | ICD-10-CM

## 2018-01-18 DIAGNOSIS — L439 Lichen planus, unspecified: Secondary | ICD-10-CM | POA: Diagnosis not present

## 2018-01-18 DIAGNOSIS — M1990 Unspecified osteoarthritis, unspecified site: Secondary | ICD-10-CM | POA: Insufficient documentation

## 2018-01-18 DIAGNOSIS — Z882 Allergy status to sulfonamides status: Secondary | ICD-10-CM | POA: Insufficient documentation

## 2018-01-18 DIAGNOSIS — Z881 Allergy status to other antibiotic agents status: Secondary | ICD-10-CM | POA: Insufficient documentation

## 2018-01-18 HISTORY — DX: Essential (primary) hypertension: I10

## 2018-01-18 HISTORY — DX: Bronchitis, not specified as acute or chronic: J40

## 2018-01-18 HISTORY — PX: VULVECTOMY: SHX1086

## 2018-01-18 LAB — POCT I-STAT 4, (NA,K, GLUC, HGB,HCT)
GLUCOSE: 80 mg/dL (ref 70–99)
HCT: 42 % (ref 36.0–46.0)
Hemoglobin: 14.3 g/dL (ref 12.0–15.0)
Potassium: 3.9 mmol/L (ref 3.5–5.1)
Sodium: 141 mmol/L (ref 135–145)

## 2018-01-18 SURGERY — WIDE EXCISION VULVECTOMY
Anesthesia: General

## 2018-01-18 MED ORDER — LACTATED RINGERS IV SOLN
INTRAVENOUS | Status: DC
  Administered 2018-01-18 (×2): via INTRAVENOUS
  Filled 2018-01-18: qty 1000

## 2018-01-18 MED ORDER — OXYCODONE HCL 5 MG PO TABS
5.0000 mg | ORAL_TABLET | Freq: Once | ORAL | Status: AC | PRN
  Administered 2018-01-18: 5 mg via ORAL
  Filled 2018-01-18: qty 1

## 2018-01-18 MED ORDER — MIDAZOLAM HCL 2 MG/2ML IJ SOLN
INTRAMUSCULAR | Status: AC
  Filled 2018-01-18: qty 2

## 2018-01-18 MED ORDER — PROMETHAZINE HCL 25 MG/ML IJ SOLN
6.2500 mg | INTRAMUSCULAR | Status: DC | PRN
  Filled 2018-01-18: qty 1

## 2018-01-18 MED ORDER — ACETIC ACID 5 % SOLN
Status: DC | PRN
  Administered 2018-01-18: 1 via TOPICAL

## 2018-01-18 MED ORDER — MIDAZOLAM HCL 2 MG/2ML IJ SOLN
INTRAMUSCULAR | Status: DC | PRN
  Administered 2018-01-18: 2 mg via INTRAVENOUS

## 2018-01-18 MED ORDER — HYDROMORPHONE HCL 1 MG/ML IJ SOLN
0.2500 mg | INTRAMUSCULAR | Status: DC | PRN
  Administered 2018-01-18 (×2): 0.25 mg via INTRAVENOUS
  Filled 2018-01-18: qty 0.5

## 2018-01-18 MED ORDER — FENTANYL CITRATE (PF) 100 MCG/2ML IJ SOLN
INTRAMUSCULAR | Status: DC | PRN
  Administered 2018-01-18 (×2): 25 ug via INTRAVENOUS
  Administered 2018-01-18: 50 ug via INTRAVENOUS

## 2018-01-18 MED ORDER — DEXAMETHASONE SODIUM PHOSPHATE 10 MG/ML IJ SOLN
INTRAMUSCULAR | Status: DC | PRN
  Administered 2018-01-18: 10 mg via INTRAVENOUS

## 2018-01-18 MED ORDER — LIDOCAINE 2% (20 MG/ML) 5 ML SYRINGE
INTRAMUSCULAR | Status: DC | PRN
  Administered 2018-01-18: 60 mg via INTRAVENOUS

## 2018-01-18 MED ORDER — HYDROMORPHONE HCL 1 MG/ML IJ SOLN
INTRAMUSCULAR | Status: AC
  Filled 2018-01-18: qty 1

## 2018-01-18 MED ORDER — OXYCODONE HCL 5 MG PO TABS
ORAL_TABLET | ORAL | Status: AC
  Filled 2018-01-18: qty 1

## 2018-01-18 MED ORDER — FENTANYL CITRATE (PF) 100 MCG/2ML IJ SOLN
INTRAMUSCULAR | Status: AC
  Filled 2018-01-18: qty 2

## 2018-01-18 MED ORDER — PROPOFOL 10 MG/ML IV BOLUS
INTRAVENOUS | Status: AC
  Filled 2018-01-18: qty 20

## 2018-01-18 MED ORDER — LIDOCAINE 2% (20 MG/ML) 5 ML SYRINGE
INTRAMUSCULAR | Status: AC
  Filled 2018-01-18: qty 5

## 2018-01-18 MED ORDER — MEPERIDINE HCL 25 MG/ML IJ SOLN
6.2500 mg | INTRAMUSCULAR | Status: DC | PRN
  Filled 2018-01-18: qty 1

## 2018-01-18 MED ORDER — PROPOFOL 10 MG/ML IV BOLUS
INTRAVENOUS | Status: DC | PRN
  Administered 2018-01-18: 200 mg via INTRAVENOUS

## 2018-01-18 MED ORDER — BUPIVACAINE HCL (PF) 0.25 % IJ SOLN
INTRAMUSCULAR | Status: DC | PRN
  Administered 2018-01-18: 2 mL

## 2018-01-18 MED ORDER — OXYCODONE HCL 5 MG/5ML PO SOLN
5.0000 mg | Freq: Once | ORAL | Status: AC | PRN
  Filled 2018-01-18: qty 5

## 2018-01-18 MED ORDER — DEXAMETHASONE SODIUM PHOSPHATE 10 MG/ML IJ SOLN
INTRAMUSCULAR | Status: AC
  Filled 2018-01-18: qty 1

## 2018-01-18 MED ORDER — ONDANSETRON HCL 4 MG/2ML IJ SOLN
INTRAMUSCULAR | Status: DC | PRN
  Administered 2018-01-18: 4 mg via INTRAVENOUS

## 2018-01-18 MED ORDER — OXYCODONE HCL 5 MG PO TABS: 5.0000 mg | ORAL_TABLET | ORAL | 0 refills | Status: DC | PRN

## 2018-01-18 MED ORDER — ONDANSETRON HCL 4 MG/2ML IJ SOLN
INTRAMUSCULAR | Status: AC
  Filled 2018-01-18: qty 2

## 2018-01-18 SURGICAL SUPPLY — 20 items
BLADE SURG 15 STRL LF DISP TIS (BLADE) ×1 IMPLANT
BLADE SURG 15 STRL SS (BLADE) ×1
CANISTER SUCT 3000ML PPV (MISCELLANEOUS) IMPLANT
CANISTER SUCTION 1200CC (MISCELLANEOUS) IMPLANT
CATH ROBINSON RED A/P 16FR (CATHETERS) IMPLANT
COVER WAND RF STERILE (DRAPES) ×2 IMPLANT
GAUZE 4X4 16PLY RFD (DISPOSABLE) ×2 IMPLANT
GLOVE BIO SURGEON STRL SZ7.5 (GLOVE) ×4 IMPLANT
GOWN STRL REUS W/TWL XL LVL3 (GOWN DISPOSABLE) ×2 IMPLANT
KIT TURNOVER CYSTO (KITS) ×2 IMPLANT
NEEDLE HYPO 25X1 1.5 SAFETY (NEEDLE) ×2 IMPLANT
NS IRRIG 500ML POUR BTL (IV SOLUTION) IMPLANT
PACK VAGINAL WOMENS (CUSTOM PROCEDURE TRAY) ×2 IMPLANT
PAD OB MATERNITY 4.3X12.25 (PERSONAL CARE ITEMS) ×2 IMPLANT
SCOPETTES 8  STERILE (MISCELLANEOUS) ×2
SCOPETTES 8 STERILE (MISCELLANEOUS) ×2 IMPLANT
SUT VIC AB 3-0 SH 27 (SUTURE) ×1
SUT VIC AB 3-0 SH 27X BRD (SUTURE) ×1 IMPLANT
TOWEL OR 17X24 6PK STRL BLUE (TOWEL DISPOSABLE) ×4 IMPLANT
WATER STERILE IRR 500ML POUR (IV SOLUTION) ×2 IMPLANT

## 2018-01-18 NOTE — Procedures (Signed)
Alverda Nazzaro  female MEDICAL RECORD [id number] DATE OF BIRTH: 04-09-1960 PHYSICIAN: [name], M.D  01/18/2018   OPERATIVE REPORT  PREOPERATIVE DIAGNOSIS:   POSTOPERATIVE DIAGNOSIS:  PROCEDURE:   SURGEON: [name], M.D   ANESTHESIA: LMA ESTIMATED BLOOD LOSS: Minimal  SURGICAL FINDINGS: Examination of the vulva revealed 2 lesions on the right labia majora.  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  The lower lesion was hyperpigmented measuring approximately 2 cm and previous biopsy showed VIN 3.  PROCEDURE: Patient brought the operating room and after satisfactory attainment of general anesthesia was placed in lithotomy position in [place].  The vulva perineum and vagina were prepped with Betadine and the patient draped.  Surgical timeout was taken.  1% lidocaine was injected beneath both lesions.  Using a scalpel the lesions were excised with elliptical incisions.  Both were submitted to pathology.  Hemostasis achieved with cautery.  Skin was reapproximated with a running subcuticular suture of 3-0 Vicryl.  Patient was awakened from anesthesia and taken to the recovery room in satisfactory condition.  Sponge needle isthmic counts correct x2.  PHYSICIAN: [name], M.D  01/18/2018

## 2018-01-18 NOTE — Anesthesia Preprocedure Evaluation (Signed)
Anesthesia Evaluation  Patient identified by MRN, date of birth, ID band Patient awake    Reviewed: Allergy & Precautions, NPO status , Patient's Chart, lab work & pertinent test results  Airway Mallampati: II  TM Distance: >3 FB Neck ROM: Full    Dental no notable dental hx.    Pulmonary neg pulmonary ROS, former smoker,    Pulmonary exam normal breath sounds clear to auscultation       Cardiovascular hypertension, Pt. on medications negative cardio ROS Normal cardiovascular exam Rhythm:Regular Rate:Normal     Neuro/Psych Anxiety negative psych ROS   GI/Hepatic negative GI ROS, Neg liver ROS,   Endo/Other  negative endocrine ROS  Renal/GU negative Renal ROS  negative genitourinary   Musculoskeletal  (+) Arthritis , Osteoarthritis,    Abdominal   Peds negative pediatric ROS (+)  Hematology  (+) HIV,   Anesthesia Other Findings   Reproductive/Obstetrics negative OB ROS                             Anesthesia Physical Anesthesia Plan  ASA: III  Anesthesia Plan: General   Post-op Pain Management:    Induction: Intravenous  PONV Risk Score and Plan: 3 and Ondansetron, Dexamethasone and Midazolam  Airway Management Planned: LMA  Additional Equipment:   Intra-op Plan:   Post-operative Plan: Extubation in OR  Informed Consent: I have reviewed the patients History and Physical, chart, labs and discussed the procedure including the risks, benefits and alternatives for the proposed anesthesia with the patient or authorized representative who has indicated his/her understanding and acceptance.   Dental advisory given  Plan Discussed with: CRNA  Anesthesia Plan Comments:         Anesthesia Quick Evaluation

## 2018-01-18 NOTE — Transfer of Care (Signed)
Immediate Anesthesia Transfer of Care Note  Patient: Courtney Hamilton  Procedure(s) Performed: Procedure(s) (LRB): PARTIAL VULVECTOMY (N/A)  Patient Location: PACU  Anesthesia Type: General  Level of Consciousness: awake, oriented, sedated and patient cooperative  Airway & Oxygen Therapy: Patient Spontanous Breathing and Patient connected to face mask oxygen  Post-op Assessment: Report given to PACU RN and Post -op Vital signs reviewed and stable  Post vital signs: Reviewed and stable  Complications: No apparent anesthesia complications  Last Vitals:  Vitals Value Taken Time  BP 118/65 01/18/2018  2:41 PM  Temp    Pulse 76 01/18/2018  2:42 PM  Resp 20 01/18/2018  2:40 PM  SpO2 99 % 01/18/2018  2:42 PM  Vitals shown include unvalidated device data.  Last Pain:  Vitals:   01/18/18 1238  TempSrc: Oral  PainSc:       Patients Stated Pain Goal: 7 (01/18/18 1222)

## 2018-01-18 NOTE — Interval H&P Note (Signed)
History and Physical Interval Note:  01/18/2018 2:00 PM  Courtney Hamilton  has presented today for surgery, with the diagnosis of VULVAR DYSPLASIA  The various methods of treatment have been discussed with the patient and family. After consideration of risks, benefits and other options for treatment, the patient has consented to  Procedure(s): PARTIAL VULVECTOMY (N/A) as a surgical intervention .  The patient's history has been reviewed, patient examined, no change in status, stable for surgery.  I have reviewed the patient's chart and labs.  Questions were answered to the patient's satisfaction.     [name]

## 2018-01-18 NOTE — Anesthesia Postprocedure Evaluation (Signed)
Anesthesia Post Note  Patient: Courtney Hamilton  Procedure(s) Performed: PARTIAL VULVECTOMY (N/A )     Patient location during evaluation: PACU Anesthesia Type: General Level of consciousness: awake and alert, oriented and awake Pain management: pain level controlled Vital Signs Assessment: post-procedure vital signs reviewed and stable Respiratory status: spontaneous breathing, nonlabored ventilation and respiratory function stable Cardiovascular status: blood pressure returned to baseline and stable Postop Assessment: no apparent nausea or vomiting Anesthetic complications: no    Last Vitals:  Vitals:   01/18/18 1527 01/18/18 1530  BP:    Pulse: 68 64  Resp: 15 10  Temp:    SpO2: 95% 95%    Last Pain:  Vitals:   01/18/18 1537  TempSrc:   PainSc: [place]

## 2018-01-18 NOTE — Discharge Instructions (Signed)
°  Post Anesthesia Home Care Instructions ° °Activity: °Get plenty of rest for the remainder of the day. A responsible individual must stay with you for 24 hours following the procedure.  °For the next 24 hours, DO NOT: °-Drive a car °-Operate machinery °-Drink alcoholic beverages °-Take any medication unless instructed by your physician °-Make any legal decisions or sign important papers. ° °Meals: °Start with liquid foods such as gelatin or soup. Progress to regular foods as tolerated. Avoid greasy, spicy, heavy foods. If nausea and/or vomiting occur, drink only clear liquids until the nausea and/or vomiting subsides. Call your physician if vomiting continues. ° °Special Instructions/Symptoms: °Your throat may feel dry or sore from the anesthesia or the breathing tube placed in your throat during surgery. If this causes discomfort, gargle with warm salt water. The discomfort should disappear within 24 hours. ° °   °Call your surgeon if you experience:  ° °1.  Fever over 101.0. °2.  Inability to urinate. °3.  Nausea and/or vomiting. °4.  Extreme swelling or bruising at the surgical site. °5.  Continued bleeding from the incision. °6.  Increased pain, redness or drainage from the incision. °7.  Problems related to your pain medication. °8.  Any problems and/or concerns °

## 2018-01-18 NOTE — Anesthesia Procedure Notes (Signed)
Procedure Name: LMA Insertion Date/Time: 01/18/2018 2:12 PM Performed by: Suan Halter, CRNA Pre-anesthesia Checklist: Patient identified, Emergency Drugs available, Suction available and Patient being monitored Patient Re-evaluated:Patient Re-evaluated prior to induction Oxygen Delivery Method: Circle system utilized Preoxygenation: Pre-oxygenation with 100% oxygen Induction Type: IV induction Ventilation: Mask ventilation without difficulty LMA: LMA inserted LMA Size: 4.0 Number of attempts: 1 Airway Equipment and Method: Bite block Placement Confirmation: positive ETCO2 Tube secured with: Tape Dental Injury: Teeth and Oropharynx as per pre-operative assessment

## 2018-01-18 NOTE — Progress Notes (Signed)
Post-op pain medication prescribed since pt states that tramadol does not work for her pain.  She is not under a pain contract with another provider.

## 2018-01-19 ENCOUNTER — Encounter (HOSPITAL_BASED_OUTPATIENT_CLINIC_OR_DEPARTMENT_OTHER): Payer: Self-pay | Admitting: Gynecology

## 2018-01-20 NOTE — Telephone Encounter (Signed)
Per chart patient was notified did not need to keep Dr.Davis appt.

## 2018-01-21 ENCOUNTER — Telehealth: Payer: Self-pay

## 2018-01-21 NOTE — Telephone Encounter (Signed)
Told Ms Thurow that Joylene John, NP stated that the area needs to be kept dry and clean.  Pt will take a shower vs bath and pat dry the surgical site.

## 2018-01-25 ENCOUNTER — Telehealth: Payer: Self-pay | Admitting: Gynecologic Oncology

## 2018-01-25 MED FILL — TIVICAY 50 MG TABLET: 50 | 30 days supply | Qty: 30 | Fill #4

## 2018-01-25 NOTE — Telephone Encounter (Signed)
Patient states she is doing well.  Informed of final path.  Follow up appt scheduled for Tuesday. No concerns voiced. Advised to call for any needs in between that time.

## 2018-01-27 MED FILL — DESCOVY 200-25 MG TABS: 200-25 | 30 days supply | Qty: 30 | Fill #4

## 2018-02-01 ENCOUNTER — Telehealth: Payer: Self-pay

## 2018-02-01 ENCOUNTER — Inpatient Hospital Stay: Payer: Medicare HMO | Admitting: Gynecology

## 2018-02-01 NOTE — Telephone Encounter (Signed)
Incoming call from pt, she needs to reschedule today's appt due to sick child.  Per Joylene John NP ok to reschedule pt with Dr Gerarda Fraction as long as pt is not having any concerns with her incision.  Pt reports incision is doing well. Rescheduled for 12/11 3:30 arrival.  Told pt to contact office if any concerns with incision, ie pain, drainage, or fever.  Pt voiced understanding. No other needs per pt at this time.

## 2018-02-01 NOTE — Telephone Encounter (Addendum)
Outgoing call to patient per Joylene John NP- realized that Dr Aldean Ast is in office on Dec 10 th and can schedule her f/u with him.  Attempted pt's number, no answer, left VM to return our call.   Pt called back and was able to schedule her for Dec 10 th with Dr Aldean Ast.

## 2018-02-07 ENCOUNTER — Other Ambulatory Visit: Payer: Self-pay

## 2018-02-07 ENCOUNTER — Ambulatory Visit (INDEPENDENT_AMBULATORY_CARE_PROVIDER_SITE_OTHER): Payer: Medicare HMO | Admitting: Family Medicine

## 2018-02-07 VITALS — BP 128/70 | HR 70 | Temp 98.8°F | Ht 67.0 in | Wt 187.0 lb

## 2018-02-07 DIAGNOSIS — R05 Cough: Secondary | ICD-10-CM

## 2018-02-07 DIAGNOSIS — R058 Other specified cough: Secondary | ICD-10-CM

## 2018-02-07 MED ORDER — IPRATROPIUM BROMIDE 0.06 % NA SOLN: 2.0000 | Freq: Four times a day (QID) | NASAL | 12 refills | Status: DC

## 2018-02-07 MED ORDER — IPRATROPIUM BROMIDE HFA 17 MCG/ACT IN AERS: 2.0000 | INHALATION_SPRAY | Freq: Four times a day (QID) | RESPIRATORY_TRACT | 12 refills | Status: DC | PRN

## 2018-02-07 NOTE — Progress Notes (Signed)
Acute Office Visit  Subjective:    Patient ID: Courtney Hamilton, female    DOB: 23-Dec-1960, 57 y.o.   MRN: [id number]  Chief Complaint  Patient presents with  . Cough    Cough  This is a new problem. The current episode started 1 to 4 weeks ago. The problem has been unchanged. The problem occurs hourly. The cough is productive of sputum. Associated symptoms include chest pain and wheezing. Pertinent negatives include no fever, headaches, nasal congestion, rhinorrhea or shortness of breath. Associated symptoms comments: Back pain . Nothing aggravates the symptoms. She has tried OTC cough suppressant for the symptoms. The treatment provided mild relief. Her past medical history is significant for bronchitis. There is no history of asthma or COPD.    Past Medical History:  Diagnosis Date  . Bronchitis winter 2018  . Herniated disc 10/07/2011   lower back  . HIV (human immunodeficiency virus infection) ([place])   . Hypertension   . Lower GI bleed 05/10/2012   pt denies  . Osteoporosis   . Ruptured lumbar disc   . Sciatica   . Substance abuse ([place])    past hsitory  clean more than 20 years   . TB (pulmonary tuberculosis) 1993    exposure, treated     Past Surgical History:  Procedure Laterality Date  . APPENDECTOMY    . BREAST EXCISIONAL BIOPSY Left 2001 per pt   cyst removed   . CHOLECYSTECTOMY    . ECTOPIC PREGNANCY SURGERY    . injections to lower back  09/2017  . SALIVARY GLAND SURGERY    . VULVECTOMY N/A 01/18/2018   Procedure: PARTIAL VULVECTOMY;  Surgeon: [name], MD;  Location: [facility];  Service: Gynecology;  Laterality: N/A;    Family History  Problem Relation Age of Onset  . Asthma Father   . COPD Father   . Cancer Mother   . Diabetes Brother   . Hypertension Brother   . Diabetes Brother   . Breast cancer Sister   . Colon cancer Maternal Uncle     Social History   Socioeconomic History  . Marital status: Married   Spouse name: Not on file  . Number of children: Not on file  . Years of education: Not on file  . Highest education level: Not on file  Occupational History  . Not on file  Social Needs  . Financial resource strain: Not on file  . Food insecurity:    Worry: Not on file    Inability: Not on file  . Transportation needs:    Medical: Not on file    Non-medical: Not on file  Tobacco Use  . Smoking status: Former Smoker    Packs/day: 0.50    Years: 30.00    Pack years: 15.00    Types: Cigarettes  . Smokeless tobacco: Never Used  . Tobacco comment: quit march 2018  Substance and Sexual Activity  . Alcohol use: No  . Drug use: No    Comment: past history of cocaine  and alcohol  none in 20 years  . Sexual activity: Yes    Partners: Male    Comment: CONDOMS GIVEN  Lifestyle  . Physical activity:    Days per week: Not on file    Minutes per session: Not on file  . Stress: Not on file  Relationships  . Social connections:    Talks on phone: Not on file    Gets together: Not on file  Attends religious service: Not on file    Active member of club or organization: Not on file    Attends meetings of clubs or organizations: Not on file    Relationship status: Not on file  . Intimate partner violence:    Fear of current or ex partner: Not on file    Emotionally abused: Not on file    Physically abused: Not on file    Forced sexual activity: Not on file  Other Topics Concern  . Not on file  Social History Narrative  . Not on file    Outpatient Medications Prior to Visit  Medication Sig Dispense Refill  . albuterol (VENTOLIN HFA) 108 (90 Base) MCG/ACT inhaler Inhale 2 puffs into the lungs every 6 (six) hours as needed for wheezing or shortness of breath. 1 Inhaler 5  . Cyanocobalamin 1000 MCG SUBL Place 1,000 mcg under the tongue every evening.     . DESCOVY 200-25 MG tablet TAKE 1 TABLET BY MOUTH DAILY. 90 tablet 2  . gabapentin (NEURONTIN) 300 MG capsule Take by mouth one  in AM and 2 at bedtime 270 capsule 1  . losartan (COZAAR) 25 MG tablet Take 1 tablet (25 mg total) by mouth daily. (Patient taking differently: Take 25 mg by mouth every evening. ) 30 tablet 11  . Multiple Vitamins-Minerals (MULTIVITAMIN WITH MINERALS) tablet Take 1 tablet by mouth daily.    Marland Kitchen nystatin cream (MYCOSTATIN) Apply 1 application topically 2 (two) times daily. 30 g 1  . oxyCODONE (OXY IR/ROXICODONE) 5 MG immediate release tablet Take 1 tablet (5 mg total) by mouth every 4 (four) hours as needed for severe pain. Do not take and drive, do not take with tramadol 15 tablet 0  . TIVICAY 50 MG tablet TAKE 1 TABLET (50 MG TOTAL) BY MOUTH DAILY. 90 tablet 2  . traMADol (ULTRAM) 50 MG tablet Take 2 tablets (100 mg total) by mouth 3 (three) times daily as needed for moderate pain. 180 tablet 5  . vitamin C (ASCORBIC ACID) 500 MG tablet Take 500 mg by mouth daily.     No facility-administered medications prior to visit.     Allergies  Allergen Reactions  . Baclofen Other (See Comments)    rash  . Bactrim [Sulfamethoxazole-Trimethoprim] Rash  . Doxycycline Rash    Review of Systems  Constitutional: Negative for fever.  HENT: Negative for rhinorrhea.   Respiratory: Positive for cough and wheezing. Negative for shortness of breath.   Cardiovascular: Positive for chest pain.  Neurological: Negative for headaches.       Objective:    Physical Exam  Constitutional: She is oriented to person, place, and time. She appears well-developed and well-nourished. No distress.  HENT:  Head: Normocephalic and atraumatic.  Eyes: No scleral icterus.  Neck: No JVD present.  Cardiovascular: Normal rate and regular rhythm. Exam reveals no gallop and no friction rub.  No murmur heard. Right chest upper region tender to palpation  Pulmonary/Chest: Effort normal and breath sounds normal. No respiratory distress. She has no wheezes.  Abdominal: Soft. She exhibits no distension. There is no tenderness.   Musculoskeletal: She exhibits no edema.  Neurological: She is alert and oriented to person, place, and time.  Skin: Skin is warm and dry.  Psychiatric: She has a normal mood and affect. Her behavior is normal.    BP 128/70   Pulse 70   Temp 98.8 F (37.1 C) (Oral)   Ht 5\' 7"  (1.702 m)   Wt 187  lb (84.8 kg)   SpO2 98%   BMI 29.29 kg/m  Wt Readings from Last 3 Encounters:  02/07/18 187 lb (84.8 kg)  01/18/18 187 lb (84.8 kg)  01/05/18 182 lb 12.8 oz (82.9 kg)    Health Maintenance Due  Topic Date Due  . TETANUS/TDAP  05/22/2017    There are no preventive care reminders to display for this patient.   No results found for: TSH Lab Results  Component Value Date   WBC 9.1 11/23/2016   HGB 14.3 01/18/2018   HCT 42.0 01/18/2018   MCV 84.3 11/23/2016   PLT 293 11/23/2016   Lab Results  Component Value Date   NA 141 01/18/2018   K 3.9 01/18/2018   CO2 23 05/18/2017   GLUCOSE 80 01/18/2018   BUN 12 05/18/2017   CREATININE 0.97 05/18/2017   BILITOT 0.5 11/23/2016   ALKPHOS 56 06/01/2016   AST 18 11/23/2016   ALT 7 11/23/2016   PROT 8.3 (H) 11/23/2016   ALBUMIN 3.6 06/01/2016   CALCIUM 9.5 05/18/2017   ANIONGAP 7 04/01/2014   Lab Results  Component Value Date   CHOL 308 (H) 05/18/2017   Lab Results  Component Value Date   HDL 70 05/18/2017   Lab Results  Component Value Date   LDLCALC 206 (H) 05/18/2017   Lab Results  Component Value Date   TRIG 158 (H) 05/18/2017   Lab Results  Component Value Date   CHOLHDL 4.4 05/18/2017   No results found for: HGBA1C     Assessment & Plan:   Problem List Items Addressed This Visit    None    Visit Diagnoses    Post-viral cough syndrome    -  Primary   Relevant Medications   ipratropium (ATROVENT) 0.06 % nasal spray    Presents with persistent cough.  Reassuring exam and vitals.  No signs of wheezing.  Likely has post viral cough syndrome.  Low suspicion for cardiovascular or other pulmonary  disease. -Trial of Atrovent spray for cough -Patient requested to follow-up if not having any improvement within 3 to 4 weeks or sooner as needed   Meds ordered this encounter  Medications  . ipratropium (ATROVENT) 0.06 % nasal spray    Sig: Place 2 sprays into both nostrils 4 (four) times daily.    Dispense:  15 mL    Refill:  12     Bonnita Hollow, MD

## 2018-02-07 NOTE — Patient Instructions (Signed)

## 2018-02-07 NOTE — Addendum Note (Signed)
Addended by: Josephine Igo B on: 02/07/2018 03:18 PM   Modules accepted: Orders

## 2018-02-08 ENCOUNTER — Ambulatory Visit: Payer: Medicare HMO

## 2018-02-08 ENCOUNTER — Ambulatory Visit: Payer: Medicare HMO | Admitting: Physical Medicine & Rehabilitation

## 2018-02-09 ENCOUNTER — Telehealth: Payer: Self-pay

## 2018-02-09 NOTE — Telephone Encounter (Signed)
Outgoing call to pt to return her call regarding last night she felt a little burning when she wiped at the incision area, reports she used neosporin and starting using the peri-bottle to squirt water on area as voiding.  Per Courtney John NP, I explained to patient this is correct management and can also use vaseline or desitin to the area as a barrier also and continue to use peri-bottle.   Pt denies any drainage or bleeding.  Pt has appt on this Tuesday and reminded her to keep appt and call our office before if any worsening symptoms ie fever, changes in incision area.  Pt voiced understanding. No other needs per pt at this time.

## 2018-02-14 ENCOUNTER — Telehealth: Payer: Self-pay | Admitting: *Deleted

## 2018-02-14 NOTE — Telephone Encounter (Signed)
Pt states that she was using the nasal spray as directed but started to have nose bleeds this weekend so she stopped taking the med.  She now is having "yellowish snot" when she blows her nose.  Believes she needs a zpac.  She declines appt at this time since she was just seen.  Would like a message sent to MD.   . Courtney Hamilton, Salome Spotted, CMA

## 2018-02-15 ENCOUNTER — Encounter: Payer: Self-pay | Admitting: Gynecology

## 2018-02-15 ENCOUNTER — Inpatient Hospital Stay: Payer: Medicare HMO | Attending: Gynecology | Admitting: Gynecology

## 2018-02-15 VITALS — BP 140/76 | HR 62 | Temp 98.1°F | Resp 20 | Ht 66.0 in | Wt 184.6 lb

## 2018-02-15 DIAGNOSIS — L439 Lichen planus, unspecified: Secondary | ICD-10-CM | POA: Diagnosis not present

## 2018-02-15 DIAGNOSIS — D071 Carcinoma in situ of vulva: Secondary | ICD-10-CM | POA: Diagnosis not present

## 2018-02-15 NOTE — Patient Instructions (Signed)
Follow up with Dr. Fermin Schwab in 4 months as scheduled.

## 2018-02-15 NOTE — Progress Notes (Signed)
Consult Note: Gyn-Onc   Courtney Hamilton 57 y.o. female  Chief Complaint  Patient presents with  . 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  Incision is healing well.  Patient return to see me in 4 months for additional follow-up.  Excoriation in the lateral labial crural fold on the right.  The patient will continue to apply Vaseline and keep this area clean.  This area is unrelated to her recent surgery.  Lichen planus of clitoral hood.    HPI: 57 year old seen in consultation the request of Dr. Vivien Rota regarding management of newly diagnosed VIN 1, VIN 3, and lichen planus of the vulva.  The patient reports that she has had vulvar pruritus for many years.  She also has some burning in the left posterior vulva.  She denies any past gynecologic history except for a remote history 2 decades ago with cryotherapy for an abnormal Pap smear.  The patient is menopausal..  She had a recent Pap smear that was normal.  Dr. 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  Patient underwent wide local excision of 2 vulvar lesions on January 18, 2018.  These were closed primarily.  Final pathology showed VIN 3.  The posterior lesion had a positive lateral margin.  Review of Systems:10 point review of systems is negative except as noted in interval history.   Vitals: Blood pressure 140/76, pulse 62, temperature 98.1 F (36.7 C), temperature source Oral, resp. rate 20, height 5\' 6"  (1.676 m), weight 184 lb 9.6 oz (83.7 kg), SpO2 100 %.  Physical Exam: General : The patient is a healthy woman in no acute distress.  HEENT: normocephalic, extraoccular movements normal; neck is supple without thyromegally  Lynphnodes: Supraclavicular and inguinal nodes not enlarged  Abdomen: Soft, non-tender, no ascites, no organomegally, no masses, no  hernias  Pelvic:  EGBUS: Normal female.  The clitoral hood there is a white flattened lesion which on biopsy is lichen planus.   The incision on the right labia majora is healing well.  There are no new lesions.  There is some excoriation in the labial crural fold on the right.  Lower extremities: No edema or varicosities. Normal range of motion.  The patient has a hyperpigmented lesion on her right inner thigh measuring approximately 2 x 3 cm.     Allergies  Allergen Reactions  . Baclofen Other (See Comments)    rash  . Bactrim [Sulfamethoxazole-Trimethoprim] Rash  . Doxycycline Rash    Past Medical History:  Diagnosis Date  . Bronchitis winter 2018  . Herniated disc 10/07/2011   lower back  . HIV (human immunodeficiency virus infection) ([place])   . Hypertension   . Lower GI bleed 05/10/2012   pt denies  . Osteoporosis   . Ruptured lumbar disc   . Sciatica   . Substance abuse ([place])    past hsitory  clean more than 20 years   . TB (pulmonary tuberculosis) 1993    exposure, treated     Past Surgical History:  Procedure Laterality Date  . APPENDECTOMY    . BREAST EXCISIONAL BIOPSY Left 2001 per pt   cyst removed   . CHOLECYSTECTOMY    . ECTOPIC PREGNANCY SURGERY    . injections to lower back  09/2017  . SALIVARY GLAND SURGERY    . VULVECTOMY N/A 01/18/2018  Procedure: PARTIAL VULVECTOMY;  Surgeon: [name], MD;  Location: [facility];  Service: Gynecology;  Laterality: N/A;    Current Outpatient Medications  Medication Sig Dispense Refill  . albuterol (VENTOLIN HFA) 108 (90 Base) MCG/ACT inhaler Inhale 2 puffs into the lungs every 6 (six) hours as needed for wheezing or shortness of breath. 1 Inhaler 5  . Cyanocobalamin 1000 MCG SUBL Place 1,000 mcg under the tongue every evening.     . DESCOVY 200-25 MG tablet TAKE 1 TABLET BY MOUTH DAILY. 90 tablet 2  . gabapentin (NEURONTIN) 300 MG capsule Take by mouth one in AM and 2 at bedtime 270  capsule 1  . ipratropium (ATROVENT HFA) 17 MCG/ACT inhaler Inhale 2 puffs into the lungs every 6 (six) hours as needed (Coughing). 1 Inhaler 12  . losartan (COZAAR) 25 MG tablet Take 1 tablet (25 mg total) by mouth daily. (Patient taking differently: Take 25 mg by mouth every evening. ) 30 tablet 11  . Multiple Vitamins-Minerals (MULTIVITAMIN WITH MINERALS) tablet Take 1 tablet by mouth daily.    Marland Kitchen nystatin cream (MYCOSTATIN) Apply 1 application topically 2 (two) times daily. 30 g 1  . TIVICAY 50 MG tablet TAKE 1 TABLET (50 MG TOTAL) BY MOUTH DAILY. 90 tablet 2  . traMADol (ULTRAM) 50 MG tablet Take 2 tablets (100 mg total) by mouth 3 (three) times daily as needed for moderate pain. 180 tablet 5  . vitamin C (ASCORBIC ACID) 500 MG tablet Take 500 mg by mouth daily.     No current facility-administered medications for this visit.     Social History   Socioeconomic History  . Marital status: Married    Spouse name: Not on file  . Number of children: Not on file  . Years of education: Not on file  . Highest education level: Not on file  Occupational History  . Not on file  Social Needs  . Financial resource strain: Not on file  . Food insecurity:    Worry: Not on file    Inability: Not on file  . Transportation needs:    Medical: Not on file    Non-medical: Not on file  Tobacco Use  . Smoking status: Former Smoker    Packs/day: 0.50    Years: 30.00    Pack years: 15.00    Types: Cigarettes  . Smokeless tobacco: Never Used  . Tobacco comment: quit march 2018  Substance and Sexual Activity  . Alcohol use: No  . Drug use: No    Comment: past history of cocaine  and alcohol  none in 20 years  . Sexual activity: Yes    Partners: Male    Comment: CONDOMS GIVEN  Lifestyle  . Physical activity:    Days per week: Not on file    Minutes per session: Not on file  . Stress: Not on file  Relationships  . Social connections:    Talks on phone: Not on file    Gets together: Not on  file    Attends religious service: Not on file    Active member of club or organization: Not on file    Attends meetings of clubs or organizations: Not on file    Relationship status: Not on file  . Intimate partner violence:    Fear of current or ex partner: Not on file    Emotionally abused: Not on file    Physically abused: Not on file    Forced sexual activity: Not on  file  Other Topics Concern  . Not on file  Social History Narrative  . Not on file    Family History  Problem Relation Age of Onset  . Asthma Father   . COPD Father   . Cancer Mother   . Diabetes Brother   . Hypertension Brother   . Diabetes Brother   . Breast cancer Sister   . Colon cancer Maternal Uncle       [name], MD 02/15/2018, 11:24 AM

## 2018-02-16 ENCOUNTER — Ambulatory Visit: Payer: Medicare HMO | Admitting: Obstetrics

## 2018-02-17 NOTE — Telephone Encounter (Signed)
If patient's symptoms are worsening, she should be seen in our clinic.  I will not prescribe an antibiotic unless patient is evaluated here, since most of these infections are viral rather than bacterial.  Thanks.

## 2018-02-18 NOTE — Telephone Encounter (Signed)
LM for pt to call the office. If pt calls, please give her the info below and help her schedule an appt if needed. Ottis Stain, CMA

## 2018-02-21 ENCOUNTER — Ambulatory Visit (INDEPENDENT_AMBULATORY_CARE_PROVIDER_SITE_OTHER): Payer: Medicare HMO | Admitting: Family Medicine

## 2018-02-21 ENCOUNTER — Other Ambulatory Visit: Payer: Self-pay

## 2018-02-21 ENCOUNTER — Telehealth: Payer: Self-pay

## 2018-02-21 DIAGNOSIS — M545 Low back pain: Secondary | ICD-10-CM

## 2018-02-21 DIAGNOSIS — G8929 Other chronic pain: Secondary | ICD-10-CM | POA: Diagnosis not present

## 2018-02-21 MED ORDER — TRAMADOL HCL 50 MG PO TABS: 100.0000 mg | ORAL_TABLET | Freq: Three times a day (TID) | ORAL | 0 refills | Status: DC | PRN

## 2018-02-21 NOTE — Telephone Encounter (Signed)
Pt called stating she is extreme pain and knows that its time for an injection. She is requesting Tramadol to be called in for her then make appt to get an injection. Last appt cancelled. Dr. Letta Pate patient

## 2018-02-21 NOTE — Telephone Encounter (Signed)
Please schedule appointment.

## 2018-02-21 NOTE — Telephone Encounter (Signed)
Pt has appointment today in [facility]. Courtney Hamilton, April D, [place]

## 2018-02-21 NOTE — Progress Notes (Signed)
   Subjective:   Patient ID: Courtney Hamilton    DOB: December 31, 1960, 57 y.o. female   MRN: [id number]  CC: Back pain  HPI: Courtney Hamilton is a 56 y.o. female who presents to clinic today for the following issue.  Back pain Sees pain management but can't get anything from them until she pays them $50 for a missed appt fee.  She reports she had surgery last month for lobectomy of her vulva.  No new or worsening symptoms of her back.  She usually has nerve blocks done at [facility].  Has been trying to referral to different pain management clinic but has not heard back.  Usually takes tramadol and has run out of her medication, has been trying to get a refill since 12/10.  Has been trying Tylenol and Aspercreme patch however no significant relief.  ROS: No fever, chills, nausea, vomiting.  No numbness or tingling.  Social: Patient is a former smoker Medications reviewed. Objective:   BP 118/62   Pulse 68   Temp 97.6 F (36.4 C) (Oral)   Ht 5\' 6"  (1.676 m)   Wt 185 lb (83.9 kg)   SpO2 98%   BMI 29.86 kg/m  Vitals and nursing note reviewed.  General: 57 year old female, NAD Neck: supple CV: RRR no MRG  Lungs: CTAB, normal effort  Abdomen: soft, NTND, +bs  Skin: warm, dry, no rash Extremities: warm and well perfused Neuro: alert, oriented x3, no focal deficits   Assessment & Plan:   Chronic low back pain without sciatica No acute worsening symptoms.  No red flags on exam.  Prescribed tramadol 50 mg 3 times daily as needed.  Discussed with patient that we will only provide a short course until she can follow-up with pain management.  She expresses good understanding of this.  Meds ordered this encounter  Medications  . traMADol (ULTRAM) 50 MG tablet    Sig: Take 2 tablets (100 mg total) by mouth 3 (three) times daily as needed for moderate pain.    Dispense:  15 tablet    Refill:  0   Lovenia Kim, MD [place]

## 2018-02-21 NOTE — Patient Instructions (Signed)
It was nice meeting you today.  You were seen in clinic for follow-up of your chronic back pain.  I have provided you with a short course of tramadol to get you through this week.  You will need to follow-up with your regular pain management clinic for further medications.  Please call clinic if any questions.  Lovenia Kim MD

## 2018-02-21 NOTE — Telephone Encounter (Signed)
Dr. Reesa Chew prescribe Tramadol, Please schedule patient for Bilateral MBB with Dr. Letta Pate.

## 2018-02-22 NOTE — Telephone Encounter (Signed)
Called pt and she states she went to see her PCP and will schedule appt for injection.

## 2018-02-23 NOTE — Assessment & Plan Note (Signed)
No acute worsening symptoms.  No red flags on exam.  Prescribed tramadol 50 mg 3 times daily as needed.  Discussed with patient that we will only provide a short course until she can follow-up with pain management.  She expresses good understanding of this.

## 2018-02-24 ENCOUNTER — Ambulatory Visit: Payer: Medicare HMO | Admitting: Physical Medicine & Rehabilitation

## 2018-02-24 MED FILL — TIVICAY 50 MG TABLET: 50 | 30 days supply | Qty: 30 | Fill #5

## 2018-02-24 MED FILL — DESCOVY 200-25 MG TABS: 200-25 | 30 days supply | Qty: 30 | Fill #5

## 2018-02-25 ENCOUNTER — Encounter: Payer: Medicare HMO | Attending: Physical Medicine & Rehabilitation

## 2018-02-25 ENCOUNTER — Ambulatory Visit (HOSPITAL_BASED_OUTPATIENT_CLINIC_OR_DEPARTMENT_OTHER): Payer: Medicare HMO | Admitting: Physical Medicine & Rehabilitation

## 2018-02-25 VITALS — BP 113/75 | HR 65 | Ht 67.0 in | Wt 184.0 lb

## 2018-02-25 DIAGNOSIS — Z79899 Other long term (current) drug therapy: Secondary | ICD-10-CM

## 2018-02-25 DIAGNOSIS — M545 Low back pain: Secondary | ICD-10-CM | POA: Diagnosis present

## 2018-02-25 DIAGNOSIS — M62830 Muscle spasm of back: Secondary | ICD-10-CM | POA: Insufficient documentation

## 2018-02-25 DIAGNOSIS — G8929 Other chronic pain: Secondary | ICD-10-CM | POA: Insufficient documentation

## 2018-02-25 DIAGNOSIS — Z5181 Encounter for therapeutic drug level monitoring: Secondary | ICD-10-CM

## 2018-02-25 NOTE — Progress Notes (Signed)
PROCEDURE RECORD [facility]   Name: Courtney Hamilton DOB:21-May-1960 MRN: [id number]  Date:02/25/2018  Physician: Claudette Laws, MD    Nurse/CMA: Nedra Hai, CMA  Allergies:  Allergies  Allergen Reactions  . Baclofen Other (See Comments)    rash  . Bactrim [Sulfamethoxazole-Trimethoprim] Rash  . Doxycycline Rash    Consent Signed: Yes.    Is patient diabetic? No.  CBG today?   Pregnant: No. LMP: No LMP recorded. Patient is postmenopausal. (age 13-55)  Anticoagulants: no Anti-inflammatory: no Antibiotics: no  Procedure: Bilateral medial branch block Position: Prone Start Time: 11:43am End Time: 11:56am Fluoro Time: 54  RN/CMA Nedra Hai, CMA Ramez Arrona, CMA    Time 11:02am 11:58am    BP 113/75 150/87    Pulse 65 59    Respirations 16 16    O2 Sat 97 97    S/S 6 6    Pain Level 10/10 6/10     D/C home with Dad, patient A & O X 3, D/C instructions reviewed, and sits independently.

## 2018-02-25 NOTE — Progress Notes (Signed)
Bilateral Lumbar L3, L4  medial branch blocks and L 5 dorsal ramus injection under fluoroscopic guidance  Indication: Lumbar pain which is not relieved by medication management or other conservative care and interfering with self-care and mobility.  Informed consent was obtained after describing risks and benefits of the procedure with the patient, this includes bleeding, infection, paralysis and medication side effects.  The patient wishes to proceed and has given written consent.  The patient was placed in prone position.  The lumbar area was marked and prepped with Betadine.  One mL of 1% lidocaine was injected into each of 6 areas into the skin and subcutaneous tissue.  Then a 22-gauge 3.5in spinal needle was inserted targeting the junction of the left S1 superior articular process and sacral ala junction. Needle was advanced under fluoroscopic guidance.  Bone contact was made.  Isovue 200 was injected x 0.5 mL demonstrating no intravascular uptake.  Then a solution  of 2% MPF lidocaine was injected x 0.5 mL.  Then the left L5 superior articular process in transverse process junction was targeted.  Bone contact was made.  Isovue 200 was injected x 0.5 mL demonstrating no intravascular uptake. Then a solution containing  2% MPF lidocaine was injected x 0.5 mL.  Then the left L4 superior articular process in transverse process junction was targeted.  Bone contact was made.  Isovue 200 was injected x 0.5 mL demonstrating no intravascular uptake.  Then a solution containing2% MPF lidocaine was injected x 0.5 mL.  This same procedure was performed on the right side using the same needle, technique and injectate.  Patient tolerated procedure well.  Post procedure instructions were given.  

## 2018-02-28 ENCOUNTER — Telehealth: Payer: Self-pay | Admitting: *Deleted

## 2018-02-28 NOTE — Telephone Encounter (Signed)
Ms Kump called and said that Dr Letta Pate said he could refill her tramadol.  She is still in a lot of pain. Please advise.

## 2018-02-28 NOTE — Telephone Encounter (Signed)
UDS not back, but if results come back and consistent  this week and Dr Letta Pate out of office, per Dr Letta Pate, we may prescribe what he has prescribed in the past (Tramadol 50 mg 2 tablets q 8 hours prn  #180) I have let her know we are waiting on results.

## 2018-02-28 NOTE — Telephone Encounter (Signed)
Awaiting UDS

## 2018-03-04 LAB — TOXASSURE SELECT,+ANTIDEPR,UR

## 2018-03-04 NOTE — Telephone Encounter (Signed)
Lab result indicates carboxy-THC with a level of 26.  Please advise

## 2018-03-07 NOTE — Telephone Encounter (Signed)
Pt called for a refill on Tramadol. Lab results are in. In absence of Dr. Letta Pate can you look at results to see if medication can be sent in. Courtney Hamilton noted level of 26 for carboxy-THC.  [place]

## 2018-03-08 ENCOUNTER — Telehealth: Payer: Self-pay | Admitting: *Deleted

## 2018-03-08 NOTE — Telephone Encounter (Signed)
I called Courtney Hamilton and told her that Dr Letta Pate will not be back until Friday 1/3 and we can do nothing until he reviews because it is positive for THC.  She admits she did take a few puffs over Thanksgiving.

## 2018-03-08 NOTE — Telephone Encounter (Signed)
error 

## 2018-03-08 NOTE — Telephone Encounter (Addendum)
Patient is calling again for refill on Tramadol. I did tell her yesterday that Dr. Letta Pate had to look at her results first.

## 2018-03-08 NOTE — Telephone Encounter (Signed)
Urine drug screen is positive for low level THC. Otherwise it is consistent for prescribed medication Tramadol.

## 2018-03-10 ENCOUNTER — Other Ambulatory Visit: Payer: Self-pay | Admitting: *Deleted

## 2018-03-11 ENCOUNTER — Encounter: Payer: Self-pay | Admitting: *Deleted

## 2018-03-11 NOTE — Telephone Encounter (Signed)
Error

## 2018-03-11 NOTE — Telephone Encounter (Signed)
No tramadol or controlled

## 2018-03-14 NOTE — Telephone Encounter (Addendum)
Notified Ms Barga. CSA/Opiod TX cancelled due to non narcotic tx only.

## 2018-03-16 ENCOUNTER — Telehealth: Payer: Self-pay

## 2018-03-16 ENCOUNTER — Encounter: Payer: Self-pay | Admitting: Obstetrics and Gynecology

## 2018-03-16 ENCOUNTER — Ambulatory Visit (INDEPENDENT_AMBULATORY_CARE_PROVIDER_SITE_OTHER): Payer: Medicare HMO | Admitting: Obstetrics and Gynecology

## 2018-03-16 VITALS — BP 106/72 | HR 81 | Wt 188.2 lb

## 2018-03-16 DIAGNOSIS — D071 Carcinoma in situ of vulva: Secondary | ICD-10-CM | POA: Diagnosis not present

## 2018-03-16 DIAGNOSIS — B379 Candidiasis, unspecified: Secondary | ICD-10-CM

## 2018-03-16 DIAGNOSIS — B001 Herpesviral vesicular dermatitis: Secondary | ICD-10-CM | POA: Diagnosis not present

## 2018-03-16 LAB — POCT URINALYSIS DIP (DEVICE)
Bilirubin Urine: NEGATIVE
GLUCOSE, UA: NEGATIVE mg/dL
Ketones, ur: NEGATIVE mg/dL
Nitrite: NEGATIVE
PROTEIN: NEGATIVE mg/dL
UROBILINOGEN UA: 0.2 mg/dL (ref 0.0–1.0)
pH: 5.5 (ref 5.0–8.0)

## 2018-03-16 MED ORDER — ACYCLOVIR 5 % EX OINT: TOPICAL_OINTMENT | CUTANEOUS | Status: DC

## 2018-03-16 MED ORDER — TERCONAZOLE 0.8 % VA CREA: 1.0000 | TOPICAL_CREAM | Freq: Every day | VAGINAL | 0 refills | Status: DC

## 2018-03-16 NOTE — Telephone Encounter (Signed)
LVM Advising Pt that her RX was sent to her pharmacy on file.

## 2018-03-16 NOTE — Progress Notes (Signed)
GYNECOLOGY OFFICE FOLLOW UP NOTE  History:  58 y.o. 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  S/p vulvectomy on 01/18/18. She is doing well but has some pain with urination and with wiping. Also reports a sore spot on her left labia that she is concerned may be a herpes outbreak but also thinks it could be an ingrown hair from shaving.   Past Medical History:  Diagnosis Date  . Bronchitis winter 2018  . Herniated disc 10/07/2011   lower back  . HIV (human immunodeficiency virus infection) ([place])   . Hypertension   . Lower GI bleed 05/10/2012   pt denies  . Osteoporosis   . Ruptured lumbar disc   . Sciatica   . Substance abuse ([place])    past hsitory  clean more than 20 years   . TB (pulmonary tuberculosis) 1993    exposure, treated     Past Surgical History:  Procedure Laterality Date  . APPENDECTOMY    . BREAST EXCISIONAL BIOPSY Left 2001 per pt   cyst removed   . CHOLECYSTECTOMY    . ECTOPIC PREGNANCY SURGERY    . injections to lower back  09/2017  . SALIVARY GLAND SURGERY    . VULVECTOMY N/A 01/18/2018   Procedure: PARTIAL VULVECTOMY;  Surgeon: [name], MD;  Location: [facility];  Service: Gynecology;  Laterality: N/A;     Current Outpatient Medications:  .  Cyanocobalamin 1000 MCG SUBL, Place 1,000 mcg under the tongue every evening. , Disp: , Rfl:  .  DESCOVY 200-25 MG tablet, TAKE 1 TABLET BY MOUTH DAILY., Disp: 90 tablet, Rfl: 2 .  gabapentin (NEURONTIN) 300 MG capsule, Take by mouth one in AM and 2 at bedtime, Disp: 270 capsule, Rfl: 1 .  losartan (COZAAR) 25 MG tablet, Take 1 tablet (25 mg total) by mouth daily. (Patient taking differently: Take 25 mg by mouth every evening. ), Disp: 30 tablet, Rfl: 11 .  Multiple Vitamins-Minerals (MULTIVITAMIN WITH MINERALS) tablet, Take 1 tablet by mouth daily., Disp: , Rfl:  .  TIVICAY 50 MG tablet, TAKE 1 TABLET (50 MG TOTAL) BY MOUTH DAILY., Disp: 90 tablet, Rfl: 2 .  vitamin C  (ASCORBIC ACID) 500 MG tablet, Take 500 mg by mouth daily., Disp: , Rfl:  .  albuterol (VENTOLIN HFA) 108 (90 Base) MCG/ACT inhaler, Inhale 2 puffs into the lungs every 6 (six) hours as needed for wheezing or shortness of breath. (Patient not taking: Reported on 02/25/2018), Disp: 1 Inhaler, Rfl: 5 .  ipratropium (ATROVENT HFA) 17 MCG/ACT inhaler, Inhale 2 puffs into the lungs every 6 (six) hours as needed (Coughing). (Patient not taking: Reported on 03/16/2018), Disp: 1 Inhaler, Rfl: 12 .  nystatin cream (MYCOSTATIN), Apply 1 application topically 2 (two) times daily. (Patient not taking: Reported on 03/16/2018), Disp: 30 g, Rfl: 1 .  terconazole (TERAZOL 3) 0.8 % vaginal cream, Place 1 applicator vaginally at bedtime. Apply nightly for three nights., Disp: 20 g, Rfl: 0 .  traMADol (ULTRAM) 50 MG tablet, Take 2 tablets (100 mg total) by mouth 3 (three) times daily as needed for moderate pain. (Patient not taking: Reported on 03/16/2018), Disp: 15 tablet, Rfl: 0  Current Facility-Administered Medications:  .  acyclovir ointment (ZOVIRAX) 5 %, , Topical, Q3H, Sloan Leiter, MD  The following portions of the patient's history were reviewed and updated as appropriate: allergies, current medications, past family history, past medical history, past social history, past surgical history and  problem list.   Review of Systems:  Pertinent items noted in HPI and remainder of comprehensive ROS otherwise negative.   Objective:  Physical Exam BP 106/72   Pulse 81   Wt 188 lb 3.2 oz (85.4 kg)   BMI 29.48 kg/m  CONSTITUTIONAL: Well-developed, well-nourished female in no acute distress.  HENT:  Normocephalic, atraumatic. External right and left ear normal. Oropharynx is clear and moist EYES: Conjunctivae and EOM are normal. Pupils are equal, round, and reactive to light. No scleral icterus.  NECK: Normal range of motion, supple, no masses SKIN: Skin is warm and dry. No rash noted. Not diaphoretic. No erythema.  No pallor. NEUROLOGIC: Alert and oriented to person, place, and time. Normal reflexes, muscle tone coordination. No cranial nerve deficit noted. PSYCHIATRIC: Normal mood and affect. Normal behavior. Normal judgment and thought content. CARDIOVASCULAR: Normal heart rate noted RESPIRATORY: Effort normal, no problems with respiration noted ABDOMEN: Soft, no distention noted.   PELVIC: Normal appearing external genitalia with evidence of vulvectomy, incision intact and appears to be healing well, no pain with palpation to urethral meatus, pain with palpation around clitoris which has whiteish discharge and appearance of yeast around clitoris and under clitoral hood, small raised bump noted where patient also reporting pain, no appearance of infection, appears to be ingrown hair. MUSCULOSKELETAL: Normal range of motion. No edema noted.  Labs and Imaging No results found.  Assessment & Plan:   1. 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 Herpes labialis Bump with appearance of ingrown hair, however sent cream per patient preference for history of herpes - acyclovir ointment (ZOVIRAX) 5 %  3. Yeast infection reviewed with patient that clitoral hood appears to have yeast infection, reviewed hygiene, keeping area dry, she will try nystatin cream she has at home, and try terconazole cream if no improvement - terconazole (TERAZOL 3) 0.8 % vaginal cream; Place 1 applicator vaginally at bedtime. Apply nightly for three nights.  Dispense: 20 g; Refill: 0  Routine preventative health maintenance measures emphasized. Please refer to After Visit Summary for other counseling recommendations.   Return if symptoms worsen or fail to improve, for Followup.   Feliz Beam, M.D. Attending Center for [employer])

## 2018-03-24 MED FILL — DESCOVY 200-25 MG TABS: 200-25 | 30 days supply | Qty: 30 | Fill #6

## 2018-03-24 MED FILL — TIVICAY 50 MG TABLET: 50 | 30 days supply | Qty: 30 | Fill #6

## 2018-04-04 ENCOUNTER — Encounter: Payer: Self-pay | Admitting: Internal Medicine

## 2018-04-04 ENCOUNTER — Ambulatory Visit (INDEPENDENT_AMBULATORY_CARE_PROVIDER_SITE_OTHER): Payer: Medicare HMO | Admitting: Internal Medicine

## 2018-04-04 VITALS — BP 115/73 | HR 51 | Temp 98.8°F | Wt 193.0 lb

## 2018-04-04 DIAGNOSIS — R69 Illness, unspecified: Secondary | ICD-10-CM | POA: Diagnosis not present

## 2018-04-04 DIAGNOSIS — B20 Human immunodeficiency virus [HIV] disease: Secondary | ICD-10-CM

## 2018-04-04 DIAGNOSIS — Z79899 Other long term (current) drug therapy: Secondary | ICD-10-CM | POA: Diagnosis not present

## 2018-04-04 DIAGNOSIS — G8929 Other chronic pain: Secondary | ICD-10-CM

## 2018-04-04 DIAGNOSIS — Z23 Encounter for immunization: Secondary | ICD-10-CM

## 2018-04-04 MED ORDER — IBUPROFEN 800 MG PO TABS: 800.0000 mg | ORAL_TABLET | Freq: Three times a day (TID) | ORAL | 0 refills | Status: DC | PRN

## 2018-04-04 NOTE — Progress Notes (Signed)
lbs

## 2018-04-04 NOTE — Progress Notes (Signed)
RFV: follow up for hiv disease Patient ID: Courtney Hamilton, female   DOB: 04-10-1960, 58 y.o.   MRN: [id number]  HPI Courtney Hamilton is a 58yo F with hiv disease, well controlled, but also known to have chronic back pain with hx of taking tramadol and also has had steroid injection with limited relief. She is asking for referral to a different pain management clinic. She reports otherwise being in good health. Continues to have good adherence.  Outpatient Encounter Medications as of 04/04/2018  Medication Sig  . Cyanocobalamin 1000 MCG SUBL Place 1,000 mcg under the tongue every evening.   . DESCOVY 200-25 MG tablet TAKE 1 TABLET BY MOUTH DAILY.  Marland Kitchen gabapentin (NEURONTIN) 300 MG capsule Take by mouth one in AM and 2 at bedtime  . losartan (COZAAR) 25 MG tablet Take 1 tablet (25 mg total) by mouth daily. (Patient taking differently: Take 25 mg by mouth every evening. )  . Multiple Vitamins-Minerals (MULTIVITAMIN WITH MINERALS) tablet Take 1 tablet by mouth daily.  Marland Kitchen TIVICAY 50 MG tablet TAKE 1 TABLET (50 MG TOTAL) BY MOUTH DAILY.  . vitamin C (ASCORBIC ACID) 500 MG tablet Take 500 mg by mouth daily.  Marland Kitchen albuterol (VENTOLIN HFA) 108 (90 Base) MCG/ACT inhaler Inhale 2 puffs into the lungs every 6 (six) hours as needed for wheezing or shortness of breath. (Patient not taking: Reported on 04/04/2018)  . ipratropium (ATROVENT HFA) 17 MCG/ACT inhaler Inhale 2 puffs into the lungs every 6 (six) hours as needed (Coughing). (Patient not taking: Reported on 03/16/2018)  . nystatin cream (MYCOSTATIN) Apply 1 application topically 2 (two) times daily. (Patient not taking: Reported on 03/16/2018)  . terconazole (TERAZOL 3) 0.8 % vaginal cream Place 1 applicator vaginally at bedtime. Apply nightly for three nights. (Patient not taking: Reported on 04/04/2018)  . traMADol (ULTRAM) 50 MG tablet Take 2 tablets (100 mg total) by mouth 3 (three) times daily as needed for moderate pain. (Patient not taking: Reported on 03/16/2018)    Facility-Administered Encounter Medications as of 04/04/2018  Medication  . acyclovir ointment (ZOVIRAX) 5 %     Patient Active Problem List   Diagnosis Date Noted  . Vulvar intraepithelial neoplasia (VIN) grade 3   . Essential hypertension 08/05/2017  . Fungal infection of skin of abdomen 06/16/2017  . Flu-like symptoms 05/25/2016  . Head injury 11/05/2015  . Insomnia 11/05/2015  . Former cigarette smoker 11/05/2015  . Chronic low back pain without sciatica 02/18/2015  . Fall 01/14/2015  . Lower abdominal pain 10/08/2014  . Generalized anxiety disorder 06/04/2014  . Anxiety state 01/07/2014  . Panic attacks 12/22/2013  . Lumbosacral spondylosis without myelopathy 09/29/2013  . 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 Lumbar paraspinal muscle spasm 01/16/2013  . Reflux 12/13/2011  . Lower leg edema 12/10/2011  . Herpes simplex 10/07/2011  . Peripheral neuropathy 10/07/2011  . Hypercholesteremia 10/07/2011  . History of tuberculosis exposure 10/01/2011  . Recurrent UTI 08/06/2011  . HIV positive ([place]) 08/03/2011  . Sleep disorder 08/03/2011  . Back pain 07/21/2011  . Osteoporosis 03/09/2008     Health Maintenance Due  Topic Date Due  . TETANUS/TDAP  05/22/2017     Review of Systems Review of Systems  Constitutional: Negative for fever, chills, diaphoresis, activity change, appetite change, fatigue and unexpected weight change.  HENT: Negative for congestion, sore throat, rhinorrhea, sneezing, trouble swallowing and sinus pressure.  Eyes: Negative for photophobia and visual disturbance.  Respiratory: Negative for cough, chest tightness, shortness  of breath, wheezing and stridor.  Cardiovascular: Negative for chest pain, palpitations and leg swelling.  Gastrointestinal: Negative for nausea, vomiting, abdominal pain, diarrhea, constipation, blood in stool, abdominal distention and anal bleeding.  Genitourinary: Negative for dysuria, hematuria, flank pain and  difficulty urinating.  Musculoskeletal: +back pain  Skin: Negative for color change, pallor, rash and wound.  Neurological: Negative for dizziness, tremors, weakness and light-headedness.  Hematological: Negative for adenopathy. Does not bruise/bleed easily.  Psychiatric/Behavioral: Negative for behavioral problems, confusion, sleep disturbance, dysphoric mood, decreased concentration and agitation.    Physical Exam   BP 115/73   Pulse (!) 51   Temp 98.8 F (37.1 C)   Wt 193 lb (87.5 kg)   BMI 30.23 kg/m   Physical Exam  Constitutional:  oriented to person, place, and time. appears well-developed and well-nourished. No distress.  HENT: [place]/AT, PERRLA, no scleral icterus Mouth/Throat: Oropharynx is clear and moist. No oropharyngeal exudate.  Cardiovascular: Normal rate, regular rhythm and normal heart sounds. Exam reveals no gallop and no friction rub.  No murmur heard.  Pulmonary/Chest: Effort normal and breath sounds normal. No respiratory distress.  has no wheezes.  Neck = supple, no nuchal rigidity Abdominal: Soft. Bowel sounds are normal.  exhibits no distension. There is no tenderness.  Lymphadenopathy: no cervical adenopathy. No axillary adenopathy Neurological: alert and oriented to person, place, and time.  Skin: Skin is warm and dry. No rash noted. No erythema.  Psychiatric: a normal mood and affect.  behavior is normal.    Lab Results  Component Value Date   CD4TCELL 14 (L) 08/30/2017   Lab Results  Component Value Date   CD4TABS 340 (L) 08/30/2017   CD4TABS 480 11/23/2016   CD4TABS 530 06/01/2016   Lab Results  Component Value Date   HIV1RNAQUANT <20 NOT DETECTED 08/30/2017   Lab Results  Component Value Date   HEPBSAB REACTIVE (A) 05/18/2017   Lab Results  Component Value Date   LABRPR NON-REACTIVE 05/18/2017    CBC Lab Results  Component Value Date   WBC 9.1 11/23/2016   RBC 5.08 11/23/2016   HGB 14.3 01/18/2018   HCT 42.0 01/18/2018   PLT 293  11/23/2016   MCV 84.3 11/23/2016   MCH 27.6 11/23/2016   MCHC 32.7 11/23/2016   RDW 12.3 11/23/2016   LYMPHSABS 3,358 11/23/2016   MONOABS 1,304 (H) 06/01/2016   EOSABS 501 (H) 11/23/2016    BMET Lab Results  Component Value Date   NA 141 01/18/2018   K 3.9 01/18/2018   CL 103 05/18/2017   CO2 23 05/18/2017   GLUCOSE 80 01/18/2018   BUN 12 05/18/2017   CREATININE 0.97 05/18/2017   CALCIUM 9.5 05/18/2017   GFRNONAA 70 11/23/2016   GFRAA 81 11/23/2016      Assessment and Plan   -lumbar pain= hx of steroid injection, referral to [facility] - ibuprofen high dose prn - hiv disease = will plan to check labs today to see that she is still undetectable  - long term medication management = will check cr  - health maintenance= will give prevnar

## 2018-04-05 LAB — T-HELPER CELL (CD4) - (RCID CLINIC ONLY)
CD4 T CELL ABS: 370 /uL — AB (ref 400–2700)
CD4 T CELL HELPER: 13 % — AB (ref 33–55)

## 2018-04-06 LAB — CBC WITH DIFFERENTIAL/PLATELET
Absolute Monocytes: 627 cells/uL (ref 200–950)
BASOS PCT: 1.4 %
Basophils Absolute: 92 cells/uL (ref 0–200)
EOS ABS: 376 {cells}/uL (ref 15–500)
Eosinophils Relative: 5.7 %
HEMATOCRIT: 38.6 % (ref 35.0–45.0)
HEMOGLOBIN: 12.7 g/dL (ref 11.7–15.5)
LYMPHS ABS: 2680 {cells}/uL (ref 850–3900)
MCH: 27.9 pg (ref 27.0–33.0)
MCHC: 32.9 g/dL (ref 32.0–36.0)
MCV: 84.8 fL (ref 80.0–100.0)
MONOS PCT: 9.5 %
MPV: 10.1 fL (ref 7.5–12.5)
Neutro Abs: 2825 cells/uL (ref 1500–7800)
Neutrophils Relative %: 42.8 %
Platelets: 265 10*3/uL (ref 140–400)
RBC: 4.55 10*6/uL (ref 3.80–5.10)
RDW: 12.8 % (ref 11.0–15.0)
Total Lymphocyte: 40.6 %
WBC: 6.6 10*3/uL (ref 3.8–10.8)

## 2018-04-06 LAB — COMPLETE METABOLIC PANEL WITH GFR
AG RATIO: 1.5 (calc) (ref 1.0–2.5)
ALT: 10 U/L (ref 6–29)
AST: 14 U/L (ref 10–35)
Albumin: 4.5 g/dL (ref 3.6–5.1)
Alkaline phosphatase (APISO): 48 U/L (ref 33–130)
BILIRUBIN TOTAL: 0.4 mg/dL (ref 0.2–1.2)
BUN: 17 mg/dL (ref 7–25)
CHLORIDE: 105 mmol/L (ref 98–110)
CO2: 27 mmol/L (ref 20–32)
Calcium: 9.4 mg/dL (ref 8.6–10.4)
Creat: 1.05 mg/dL (ref 0.50–1.05)
GFR, Est African American: 67 mL/min/{1.73_m2} (ref >=60)
GFR, Est Non African American: 58 mL/min/{1.73_m2} — ABNORMAL LOW (ref >=60)
Globulin: 3.1 g/dL (calc) (ref 1.9–3.7)
Glucose, Bld: 89 mg/dL (ref 65–99)
POTASSIUM: 4.1 mmol/L (ref 3.5–5.3)
Sodium: 140 mmol/L (ref 135–146)
Total Protein: 7.6 g/dL (ref 6.1–8.1)

## 2018-04-06 LAB — HIV-1 RNA QUANT-NO REFLEX-BLD
HIV 1 RNA QUANT: NOT DETECTED {copies}/mL
HIV-1 RNA Quant, Log: <1.3 Log copies/mL

## 2018-04-06 LAB — RPR: RPR: NONREACTIVE

## 2018-04-08 DIAGNOSIS — Z79899 Other long term (current) drug therapy: Secondary | ICD-10-CM | POA: Diagnosis not present

## 2018-04-08 DIAGNOSIS — M129 Arthropathy, unspecified: Secondary | ICD-10-CM | POA: Diagnosis not present

## 2018-04-08 DIAGNOSIS — M25561 Pain in right knee: Secondary | ICD-10-CM | POA: Diagnosis not present

## 2018-04-08 DIAGNOSIS — M545 Low back pain: Secondary | ICD-10-CM | POA: Diagnosis not present

## 2018-04-11 ENCOUNTER — Telehealth: Payer: Self-pay | Admitting: Family Medicine

## 2018-04-11 NOTE — Telephone Encounter (Signed)
Pt called in about a prescription that Dr. Rosana Hoes sent in for her. She would like to speak to someone about it.

## 2018-04-12 ENCOUNTER — Other Ambulatory Visit: Payer: Self-pay

## 2018-04-12 MED ORDER — ACYCLOVIR 5 % EX OINT: 1.0000 "application " | TOPICAL_OINTMENT | CUTANEOUS | 0 refills | Status: DC

## 2018-04-12 NOTE — Telephone Encounter (Signed)
Patient left message stating Dr.Davis was supposed to call in zovirax ointment for her outback on  labia. It appears it was attempted to be e-prescribed but was sent to be giving in the office. Patient report being in pain and request medication be called in today.

## 2018-04-13 DIAGNOSIS — M545 Low back pain: Secondary | ICD-10-CM | POA: Diagnosis not present

## 2018-04-13 DIAGNOSIS — M25561 Pain in right knee: Secondary | ICD-10-CM | POA: Diagnosis not present

## 2018-04-15 DIAGNOSIS — M47817 Spondylosis without myelopathy or radiculopathy, lumbosacral region: Secondary | ICD-10-CM | POA: Diagnosis not present

## 2018-04-15 DIAGNOSIS — M25561 Pain in right knee: Secondary | ICD-10-CM | POA: Diagnosis not present

## 2018-04-15 DIAGNOSIS — M545 Low back pain: Secondary | ICD-10-CM | POA: Diagnosis not present

## 2018-04-21 ENCOUNTER — Other Ambulatory Visit: Payer: Self-pay | Admitting: Internal Medicine

## 2018-04-22 DIAGNOSIS — I10 Essential (primary) hypertension: Secondary | ICD-10-CM | POA: Diagnosis not present

## 2018-04-22 DIAGNOSIS — R0602 Shortness of breath: Secondary | ICD-10-CM | POA: Diagnosis not present

## 2018-04-22 DIAGNOSIS — R300 Dysuria: Secondary | ICD-10-CM | POA: Diagnosis not present

## 2018-04-22 DIAGNOSIS — E7800 Pure hypercholesterolemia, unspecified: Secondary | ICD-10-CM | POA: Diagnosis not present

## 2018-04-22 DIAGNOSIS — R5383 Other fatigue: Secondary | ICD-10-CM | POA: Diagnosis not present

## 2018-04-22 DIAGNOSIS — D539 Nutritional anemia, unspecified: Secondary | ICD-10-CM | POA: Diagnosis not present

## 2018-04-22 DIAGNOSIS — Z0000 Encounter for general adult medical examination without abnormal findings: Secondary | ICD-10-CM | POA: Diagnosis not present

## 2018-04-25 MED FILL — DESCOVY 200-25 MG TABS: 200-25 | 30 days supply | Qty: 30 | Fill #0

## 2018-04-25 MED FILL — TIVICAY 50 MG TABLET: 50 | 30 days supply | Qty: 30 | Fill #0

## 2018-04-27 DIAGNOSIS — R0602 Shortness of breath: Secondary | ICD-10-CM | POA: Diagnosis not present

## 2018-05-05 ENCOUNTER — Other Ambulatory Visit: Payer: Self-pay | Admitting: Family Medicine

## 2018-05-06 DIAGNOSIS — B372 Candidiasis of skin and nail: Secondary | ICD-10-CM | POA: Diagnosis not present

## 2018-05-06 DIAGNOSIS — M25571 Pain in right ankle and joints of right foot: Secondary | ICD-10-CM | POA: Diagnosis not present

## 2018-05-06 DIAGNOSIS — I10 Essential (primary) hypertension: Secondary | ICD-10-CM | POA: Diagnosis not present

## 2018-05-06 DIAGNOSIS — E7800 Pure hypercholesterolemia, unspecified: Secondary | ICD-10-CM | POA: Diagnosis not present

## 2018-05-17 DIAGNOSIS — M25571 Pain in right ankle and joints of right foot: Secondary | ICD-10-CM | POA: Diagnosis not present

## 2018-05-18 DIAGNOSIS — M545 Low back pain: Secondary | ICD-10-CM | POA: Diagnosis not present

## 2018-05-18 DIAGNOSIS — Z79899 Other long term (current) drug therapy: Secondary | ICD-10-CM | POA: Diagnosis not present

## 2018-05-18 DIAGNOSIS — G8929 Other chronic pain: Secondary | ICD-10-CM | POA: Diagnosis not present

## 2018-05-23 ENCOUNTER — Other Ambulatory Visit: Payer: Self-pay | Admitting: Internal Medicine

## 2018-05-26 MED FILL — DESCOVY 200-25 MG TABS: 200-25 | 30 days supply | Qty: 30 | Fill #1

## 2018-05-26 MED FILL — TIVICAY 50 MG TABLET: 50 | 30 days supply | Qty: 30 | Fill #1

## 2018-06-02 ENCOUNTER — Telehealth: Payer: Self-pay | Admitting: *Deleted

## 2018-06-02 NOTE — Telephone Encounter (Signed)
Called and spoke with the patient regarding the need to reschedule her appt on 4/14 due to the hospital policy for TDSKA-76. Patient has a routine appt and that appt was moved to 5/12. Explained that she may receive a call the day before to pre-screened and then again the day of the appt at the front desk. Also explained the no visitor policy

## 2018-06-06 ENCOUNTER — Ambulatory Visit: Payer: Medicare HMO | Admitting: Registered"

## 2018-06-16 DIAGNOSIS — E559 Vitamin D deficiency, unspecified: Secondary | ICD-10-CM | POA: Diagnosis not present

## 2018-06-16 DIAGNOSIS — I10 Essential (primary) hypertension: Secondary | ICD-10-CM | POA: Diagnosis not present

## 2018-06-16 DIAGNOSIS — G8929 Other chronic pain: Secondary | ICD-10-CM | POA: Diagnosis not present

## 2018-06-16 DIAGNOSIS — M545 Low back pain: Secondary | ICD-10-CM | POA: Diagnosis not present

## 2018-06-21 ENCOUNTER — Ambulatory Visit: Payer: Medicare HMO | Admitting: Gynecology

## 2018-06-24 MED FILL — DESCOVY 200-25 MG TABS: 200-25 | 30 days supply | Qty: 30 | Fill #2

## 2018-06-24 MED FILL — TIVICAY 50 MG TABLET: 50 | 30 days supply | Qty: 30 | Fill #2

## 2018-06-29 ENCOUNTER — Other Ambulatory Visit: Payer: Self-pay | Admitting: Physical Medicine & Rehabilitation

## 2018-07-08 ENCOUNTER — Telehealth: Payer: Self-pay | Admitting: *Deleted

## 2018-07-08 NOTE — Telephone Encounter (Signed)
Called and spoke with the patient regarding her appt for 5/12. Changed the appt to a phone visit

## 2018-07-16 DIAGNOSIS — M5440 Lumbago with sciatica, unspecified side: Secondary | ICD-10-CM | POA: Diagnosis not present

## 2018-07-16 DIAGNOSIS — J069 Acute upper respiratory infection, unspecified: Secondary | ICD-10-CM | POA: Diagnosis not present

## 2018-07-19 ENCOUNTER — Inpatient Hospital Stay: Payer: Medicare HMO | Attending: Gynecology | Admitting: Gynecology

## 2018-07-19 ENCOUNTER — Telehealth: Payer: Self-pay | Admitting: Gynecology

## 2018-07-19 ENCOUNTER — Encounter: Payer: Self-pay | Admitting: Gynecology

## 2018-07-19 DIAGNOSIS — D071 Carcinoma in situ of vulva: Secondary | ICD-10-CM

## 2018-07-19 NOTE — Patient Instructions (Signed)
Please follow up with Dr. Rosana Hoes in 6 months

## 2018-07-19 NOTE — Progress Notes (Signed)
Virtual Visit via Telephone Note  I connected with Courtney Hamilton on 07/19/18 at  1:15 PM EDT by telephone and verified that I am speaking with the correct person using two identifiers.  Location: Patient: HOME Provider: [facility]   I discussed the limitations, risks, security and privacy concerns of performing an evaluation and management service by telephone and the availability of in person appointments. I also discussed with the patient that there may be a patient responsible charge related to this service. The patient expressed understanding and agreed to proceed.   SEE ATTACHED NOTE    I discussed the assessment and treatment plan with the patient. The patient was provided an opportunity to ask questions and all were answered. The patient agreed with the plan and demonstrated an understanding of the instructions.   The patient was advised to call back or seek an in-person evaluation if the symptoms worsen or if the condition fails to improve as anticipated.  I provided 15 minutes of non-face-to-face time during this encounter.   [name], MD

## 2018-07-19 NOTE — Progress Notes (Signed)
Phone visit for 10 minutes/ 10 minutes preparation and completion of visit.

## 2018-07-19 NOTE — Telephone Encounter (Signed)
See phone visit note from same day

## 2018-07-19 NOTE — Progress Notes (Signed)
Consult Note: Gyn-Onc   Courtney Hamilton 58 y.o. female  Chief Complaint  Patient presents with  . vulvar dysplasia grade 3    Assessment and plan: VIN 3 status post wide local excision number 02/2018.  Today the patient reports no symptoms or signs of VIN. She will follow up with Dr. Rosana Hoes.  We would be happy to see the patient again should she develop any new problems.   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  Interval History:   Patient seen in a phone visit for followup of VIN 3.  She saw her primary gynecologist, Dr. Vivien Rota, in January.  According to Dr. Rosana Hoes' note she was doing well.  Today the patient denies any new vulvar lesions, discharge, bleeding or pruritis.  She feels fine and has no other GI, GU or pelvic symptoms.   She is staying at home due to the COVID virus.    HPI: 58 year old seen in consultation the request of Dr. Vivien Rota regarding management of newly diagnosed VIN 1, VIN 3, and lichen planus of the vulva.  The patient reports that she has had vulvar pruritus for many years.  She also has some burning in the left posterior vulva.  She denies any past gynecologic history except for a remote history 2 decades ago with cryotherapy for an abnormal Pap smear.  The patient is menopausal..  She had a recent Pap smear that was normal.  Dr. 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  Patient underwent wide local excision of 2 vulvar lesions on January 18, 2018.  These were closed primarily.  Final pathology showed VIN 3.  The posterior lesion had a positive lateral margin.  Review of Systems:10 point review of systems is negative except as noted in interval history.   Vitals: There were no vitals taken for this visit.  Physical Exam:   Not done-phone visit      Allergies  Allergen Reactions  . Baclofen Other (See Comments)    rash  . Bactrim  [Sulfamethoxazole-Trimethoprim] Rash  . Doxycycline Rash    Past Medical History:  Diagnosis Date  . Bronchitis winter 2018  . Herniated disc 10/07/2011   lower back  . HIV (human immunodeficiency virus infection) ([place])   . Hypertension   . Lower GI bleed 05/10/2012   pt denies  . Osteoporosis   . Ruptured lumbar disc   . Sciatica   . Substance abuse ([place])    past hsitory  clean more than 20 years   . TB (pulmonary tuberculosis) 1993    exposure, treated     Past Surgical History:  Procedure Laterality Date  . APPENDECTOMY    . BREAST EXCISIONAL BIOPSY Left 2001 per pt   cyst removed   . CHOLECYSTECTOMY    . ECTOPIC PREGNANCY SURGERY    . injections to lower back  09/2017  . SALIVARY GLAND SURGERY    . VULVECTOMY N/A 01/18/2018   Procedure: PARTIAL VULVECTOMY;  Surgeon: [name], MD;  Location: [facility];  Service: Gynecology;  Laterality: N/A;    Current Outpatient Medications  Medication Sig Dispense Refill  . acyclovir ointment (ZOVIRAX) 5 % Apply 1 application topically every 3 (three) hours. 30 g 0  . albuterol (VENTOLIN HFA) 108 (90 Base) MCG/ACT inhaler Inhale 2 puffs into the lungs every 6 (six) hours as needed for wheezing or shortness of breath. (Patient not taking:  Reported on 04/04/2018) 1 Inhaler 5  . Cyanocobalamin 1000 MCG SUBL Place 1,000 mcg under the tongue every evening.     . DESCOVY 200-25 MG tablet TAKE 1 TABLET BY MOUTH DAILY. 90 tablet 2  . gabapentin (NEURONTIN) 300 MG capsule TAKE 1 CAPSULE BY MOUTH THREE TIMES A DAY 270 capsule 1  . ibuprofen (ADVIL,MOTRIN) 800 MG tablet TAKE 1 TABLET (800 MG TOTAL) BY MOUTH EVERY 8 (EIGHT) HOURS AS NEEDED FOR MODERATE PAIN. 30 tablet 0  . ipratropium (ATROVENT HFA) 17 MCG/ACT inhaler Inhale 2 puffs into the lungs every 6 (six) hours as needed (Coughing). (Patient not taking: Reported on 03/16/2018) 1 Inhaler 12  . losartan (COZAAR) 25 MG tablet Take 1 tablet (25 mg total) by mouth  daily. (Patient taking differently: Take 25 mg by mouth every evening. ) 30 tablet 11  . Multiple Vitamins-Minerals (MULTIVITAMIN WITH MINERALS) tablet Take 1 tablet by mouth daily.    Marland Kitchen nystatin cream (MYCOSTATIN) Apply 1 application topically 2 (two) times daily. (Patient not taking: Reported on 03/16/2018) 30 g 1  . terconazole (TERAZOL 3) 0.8 % vaginal cream Place 1 applicator vaginally at bedtime. Apply nightly for three nights. (Patient not taking: Reported on 04/04/2018) 20 g 0  . TIVICAY 50 MG tablet TAKE 1 TABLET (50 MG TOTAL) BY MOUTH DAILY. 90 tablet 2  . traMADol (ULTRAM) 50 MG tablet Take 2 tablets (100 mg total) by mouth 3 (three) times daily as needed for moderate pain. (Patient not taking: Reported on 03/16/2018) 15 tablet 0  . vitamin C (ASCORBIC ACID) 500 MG tablet Take 500 mg by mouth daily.     Current Facility-Administered Medications  Medication Dose Route Frequency Provider Last Rate Last Dose  . acyclovir ointment (ZOVIRAX) 5 %   Topical Q3H Sloan Leiter, MD        Social History   Socioeconomic History  . Marital status: Married    Spouse name: Not on file  . Number of children: Not on file  . Years of education: Not on file  . Highest education level: Not on file  Occupational History  . Not on file  Social Needs  . Financial resource strain: Not on file  . Food insecurity:    Worry: Not on file    Inability: Not on file  . Transportation needs:    Medical: Not on file    Non-medical: Not on file  Tobacco Use  . Smoking status: Former Smoker    Packs/day: 0.50    Years: 30.00    Pack years: 15.00    Types: Cigarettes  . Smokeless tobacco: Never Used  . Tobacco comment: quit march 2018  Substance and Sexual Activity  . Alcohol use: No  . Drug use: No    Comment: past history of cocaine  and alcohol  none in 20 years  . Sexual activity: Yes    Partners: Male    Comment: CONDOMS GIVEN  Lifestyle  . Physical activity:    Days per week: Not on file     Minutes per session: Not on file  . Stress: Not on file  Relationships  . Social connections:    Talks on phone: Not on file    Gets together: Not on file    Attends religious service: Not on file    Active member of club or organization: Not on file    Attends meetings of clubs or organizations: Not on file    Relationship status: Not on file  .  Intimate partner violence:    Fear of current or ex partner: Not on file    Emotionally abused: Not on file    Physically abused: Not on file    Forced sexual activity: Not on file  Other Topics Concern  . Not on file  Social History Narrative  . Not on file    Family History  Problem Relation Age of Onset  . Asthma Father   . COPD Father   . Cancer Mother   . Diabetes Brother   . Hypertension Brother   . Diabetes Brother   . Breast cancer Sister   . Colon cancer Maternal Uncle       [name], MD 07/19/2018, 1:27 PM

## 2018-07-22 MED FILL — DESCOVY 200-25 MG TABS: 200-25 | 30 days supply | Qty: 30 | Fill #3

## 2018-07-22 MED FILL — TIVICAY 50 MG TABLET: 50 | 30 days supply | Qty: 30 | Fill #3

## 2018-08-15 ENCOUNTER — Other Ambulatory Visit: Payer: Self-pay | Admitting: Internal Medicine

## 2018-08-24 MED FILL — DESCOVY 200-25 MG TABS: 200-25 | 30 days supply | Qty: 30 | Fill #4

## 2018-08-24 MED FILL — TIVICAY 50 MG TABLET: 50 | 30 days supply | Qty: 30 | Fill #4

## 2018-09-13 DIAGNOSIS — Z79899 Other long term (current) drug therapy: Secondary | ICD-10-CM | POA: Diagnosis not present

## 2018-09-13 DIAGNOSIS — G8929 Other chronic pain: Secondary | ICD-10-CM | POA: Diagnosis not present

## 2018-09-13 DIAGNOSIS — Z1159 Encounter for screening for other viral diseases: Secondary | ICD-10-CM | POA: Diagnosis not present

## 2018-09-13 DIAGNOSIS — M545 Low back pain: Secondary | ICD-10-CM | POA: Diagnosis not present

## 2018-09-13 DIAGNOSIS — E7800 Pure hypercholesterolemia, unspecified: Secondary | ICD-10-CM | POA: Diagnosis not present

## 2018-09-22 MED FILL — TIVICAY 50 MG TABLET: 50 | 30 days supply | Qty: 30 | Fill #5

## 2018-09-22 MED FILL — DESCOVY 200-25 MG TABS: 200-25 | 30 days supply | Qty: 30 | Fill #5

## 2018-10-05 ENCOUNTER — Other Ambulatory Visit: Payer: Self-pay | Admitting: Family Medicine

## 2018-10-05 DIAGNOSIS — B369 Superficial mycosis, unspecified: Secondary | ICD-10-CM

## 2018-10-25 MED FILL — DESCOVY 200-25 MG TABS: 200-25 | 30 days supply | Qty: 30 | Fill #6

## 2018-10-25 MED FILL — TIVICAY 50 MG TABLET: 50 | 30 days supply | Qty: 30 | Fill #6

## 2018-11-17 ENCOUNTER — Other Ambulatory Visit: Payer: Self-pay | Admitting: Internal Medicine

## 2018-11-21 DIAGNOSIS — G8929 Other chronic pain: Secondary | ICD-10-CM | POA: Diagnosis not present

## 2018-11-21 DIAGNOSIS — R300 Dysuria: Secondary | ICD-10-CM | POA: Diagnosis not present

## 2018-11-21 DIAGNOSIS — R82998 Other abnormal findings in urine: Secondary | ICD-10-CM | POA: Diagnosis not present

## 2018-11-21 DIAGNOSIS — Z79899 Other long term (current) drug therapy: Secondary | ICD-10-CM | POA: Diagnosis not present

## 2018-11-21 DIAGNOSIS — M545 Low back pain: Secondary | ICD-10-CM | POA: Diagnosis not present

## 2018-11-24 MED FILL — DESCOVY 200-25 MG TABS: 200-25 | 30 days supply | Qty: 30 | Fill #7

## 2018-11-24 MED FILL — TIVICAY 50 MG TABLET: 50 | 30 days supply | Qty: 30 | Fill #7

## 2018-12-03 DIAGNOSIS — R69 Illness, unspecified: Secondary | ICD-10-CM | POA: Diagnosis not present

## 2018-12-14 DIAGNOSIS — Z86010 Personal history of colonic polyps: Secondary | ICD-10-CM | POA: Diagnosis not present

## 2018-12-14 DIAGNOSIS — R109 Unspecified abdominal pain: Secondary | ICD-10-CM | POA: Diagnosis not present

## 2018-12-18 ENCOUNTER — Other Ambulatory Visit: Payer: Self-pay | Admitting: Physical Medicine & Rehabilitation

## 2018-12-22 MED FILL — DESCOVY 200-25 MG TABS: 200-25 | 30 days supply | Qty: 30 | Fill #8

## 2018-12-22 MED FILL — TIVICAY 50 MG TABLET: 50 | 30 days supply | Qty: 30 | Fill #8

## 2019-01-19 ENCOUNTER — Other Ambulatory Visit: Payer: Self-pay | Admitting: Internal Medicine

## 2019-01-24 MED FILL — DESCOVY 200-25 MG TABS: 200-25 | 30 days supply | Qty: 30 | Fill #0

## 2019-01-24 MED FILL — TIVICAY 50 MG TABLET: 50 | 30 days supply | Qty: 30 | Fill #0

## 2019-02-10 DIAGNOSIS — K5790 Diverticulosis of intestine, part unspecified, without perforation or abscess without bleeding: Secondary | ICD-10-CM | POA: Diagnosis not present

## 2019-02-10 DIAGNOSIS — R1030 Lower abdominal pain, unspecified: Secondary | ICD-10-CM | POA: Diagnosis not present

## 2019-02-10 DIAGNOSIS — Z86010 Personal history of colonic polyps: Secondary | ICD-10-CM | POA: Diagnosis not present

## 2019-02-10 DIAGNOSIS — R1031 Right lower quadrant pain: Secondary | ICD-10-CM | POA: Diagnosis not present

## 2019-02-13 DIAGNOSIS — R1030 Lower abdominal pain, unspecified: Secondary | ICD-10-CM | POA: Diagnosis not present

## 2019-02-14 ENCOUNTER — Other Ambulatory Visit: Payer: Self-pay | Admitting: Physician Assistant

## 2019-02-14 ENCOUNTER — Other Ambulatory Visit: Payer: Self-pay | Admitting: Internal Medicine

## 2019-02-14 ENCOUNTER — Telehealth: Payer: Self-pay

## 2019-02-14 DIAGNOSIS — R1030 Lower abdominal pain, unspecified: Secondary | ICD-10-CM

## 2019-02-14 NOTE — Telephone Encounter (Signed)
Attempted to call patient after receiving refill request for Tivicay/ Descovy. Patient is overdue for an appointment for lab work/office visit. Unable to leave voicemail at this time. Will send mychart message requesting patient call office to avoid lapse in care. [place]

## 2019-02-20 DIAGNOSIS — Z79899 Other long term (current) drug therapy: Secondary | ICD-10-CM | POA: Diagnosis not present

## 2019-02-20 DIAGNOSIS — G8929 Other chronic pain: Secondary | ICD-10-CM | POA: Diagnosis not present

## 2019-02-20 DIAGNOSIS — M545 Low back pain: Secondary | ICD-10-CM | POA: Diagnosis not present

## 2019-02-20 DIAGNOSIS — Z1159 Encounter for screening for other viral diseases: Secondary | ICD-10-CM | POA: Diagnosis not present

## 2019-02-20 DIAGNOSIS — L84 Corns and callosities: Secondary | ICD-10-CM | POA: Diagnosis not present

## 2019-02-20 MED FILL — TIVICAY 50 MG TABLET: 50 | 30 days supply | Qty: 30 | Fill #0

## 2019-02-20 MED FILL — DESCOVY 200-25 MG TABS: 200-25 | 30 days supply | Qty: 30 | Fill #0

## 2019-02-22 ENCOUNTER — Other Ambulatory Visit: Payer: Medicare HMO

## 2019-02-22 ENCOUNTER — Other Ambulatory Visit: Payer: Self-pay | Admitting: *Deleted

## 2019-02-22 DIAGNOSIS — Z21 Asymptomatic human immunodeficiency virus [HIV] infection status: Secondary | ICD-10-CM

## 2019-02-22 DIAGNOSIS — Z79899 Other long term (current) drug therapy: Secondary | ICD-10-CM

## 2019-02-22 DIAGNOSIS — Z113 Encounter for screening for infections with a predominantly sexual mode of transmission: Secondary | ICD-10-CM

## 2019-03-01 ENCOUNTER — Other Ambulatory Visit: Payer: Medicare HMO

## 2019-03-14 ENCOUNTER — Other Ambulatory Visit: Payer: Self-pay | Admitting: Internal Medicine

## 2019-03-14 DIAGNOSIS — Z21 Asymptomatic human immunodeficiency virus [HIV] infection status: Secondary | ICD-10-CM

## 2019-03-15 ENCOUNTER — Ambulatory Visit (INDEPENDENT_AMBULATORY_CARE_PROVIDER_SITE_OTHER): Payer: Medicare HMO | Admitting: Internal Medicine

## 2019-03-15 ENCOUNTER — Ambulatory Visit
Admission: RE | Admit: 2019-03-15 | Discharge: 2019-03-15 | Disposition: A | Discharge status: Home / Self Care | Payer: Medicare HMO | Source: Ambulatory Visit | Attending: Physician Assistant | Admitting: Physician Assistant

## 2019-03-15 ENCOUNTER — Other Ambulatory Visit: Payer: Self-pay

## 2019-03-15 ENCOUNTER — Encounter: Payer: Self-pay | Admitting: Internal Medicine

## 2019-03-15 VITALS — BP 113/76 | HR 67 | Temp 98.0°F | Wt 179.0 lb

## 2019-03-15 DIAGNOSIS — R1030 Lower abdominal pain, unspecified: Secondary | ICD-10-CM

## 2019-03-15 DIAGNOSIS — F5101 Primary insomnia: Secondary | ICD-10-CM

## 2019-03-15 DIAGNOSIS — B20 Human immunodeficiency virus [HIV] disease: Secondary | ICD-10-CM

## 2019-03-15 DIAGNOSIS — Z79899 Other long term (current) drug therapy: Secondary | ICD-10-CM | POA: Diagnosis not present

## 2019-03-15 IMAGING — CT CT ABD-PELV W/ CM
1 of 3 series · 14 of 32 positions shown, 19 images · IV contrast (iopamidol)
Comparison: [DATE]

CLINICAL DATA: Lower abdominal pain for several months.

Creatinine was obtained on site at [HOSPITAL] at [HOSPITAL].
Results: Creatinine 0.8 mg/dL.
EXAM:
CT ABDOMEN AND PELVIS WITH CONTRAST
TECHNIQUE: Multidetector CT imaging of the abdomen and pelvis was performed
using the standard protocol following bolus administration of
intravenous contrast.
CONTRAST:  100mL [B5] IOPAMIDOL ([B5]) INJECTION 61%

[Series 2: abd/pelvis w/cm · axial · 0.75mm/px · z∈[-419,-59]mm · 14 of 82 slices shown, 19 images]
[im 5/82  soft-tissue]
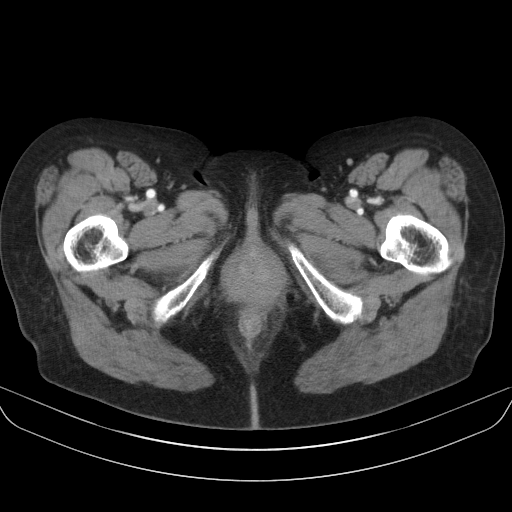
[im 5/82  bone]
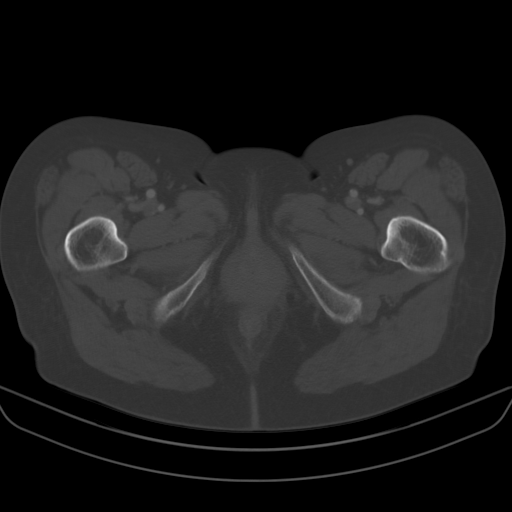
[im 10/82  soft-tissue]
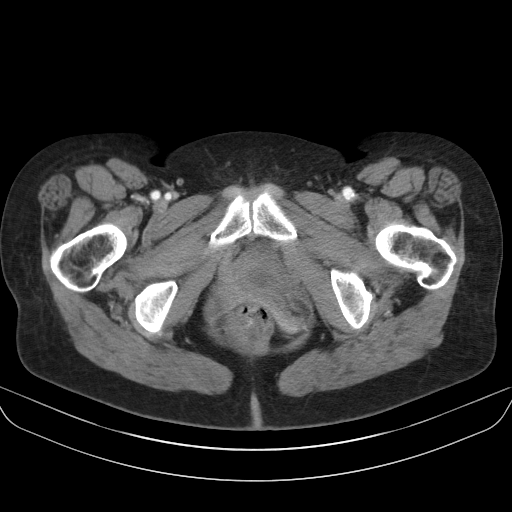
[im 19/82  soft-tissue]
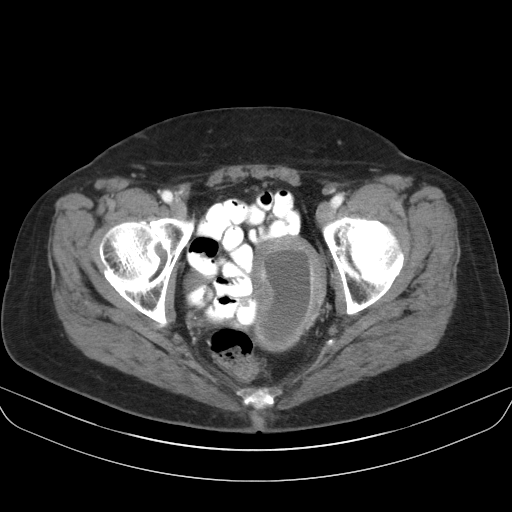
[im 23/82  soft-tissue]
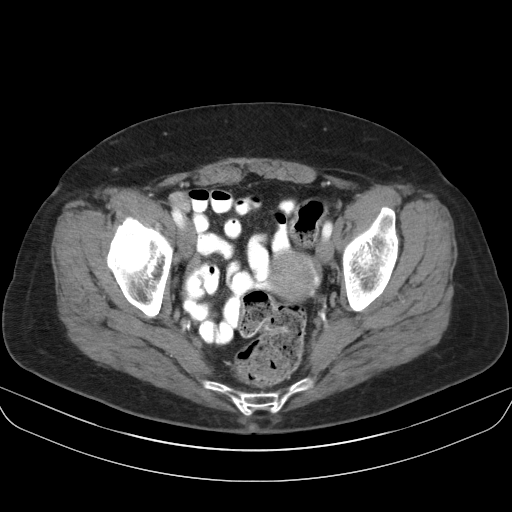
[im 28/82  soft-tissue]
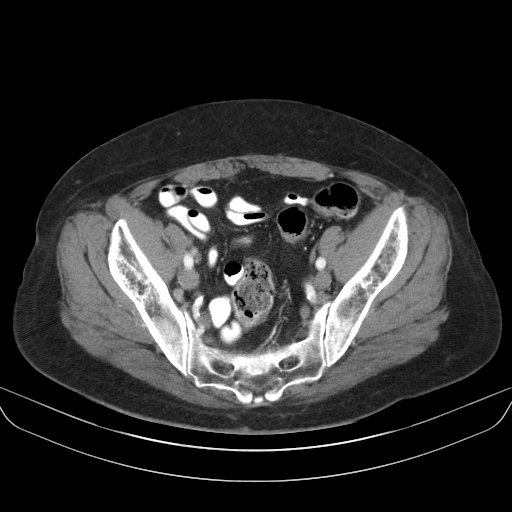
[im 37/82  soft-tissue]
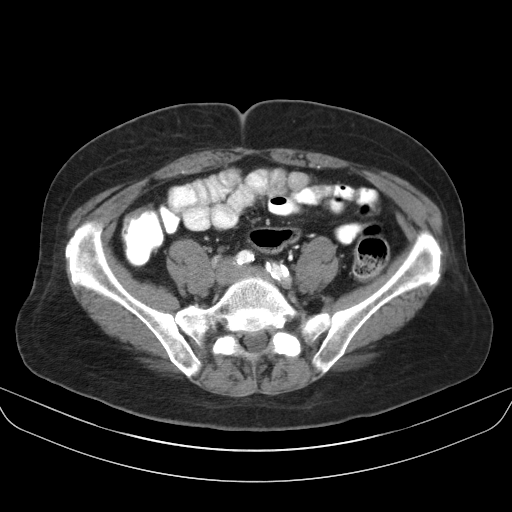
[im 41/82  soft-tissue]
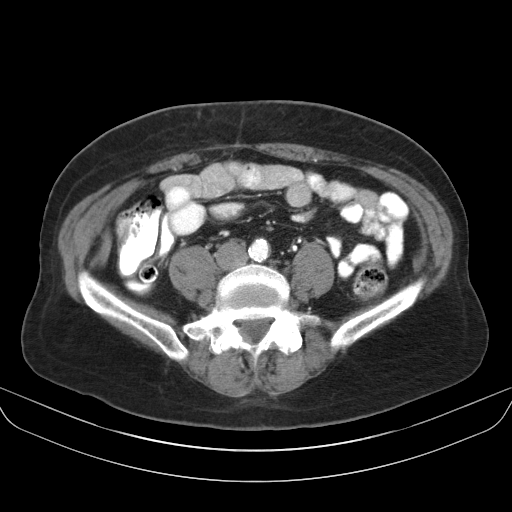
[im 46/82  soft-tissue]
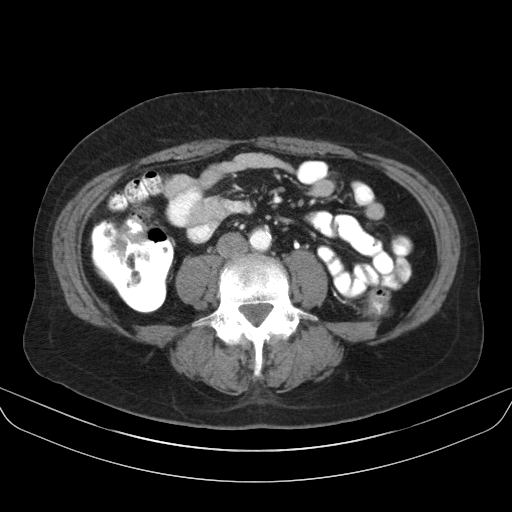
[im 55/82  soft-tissue]
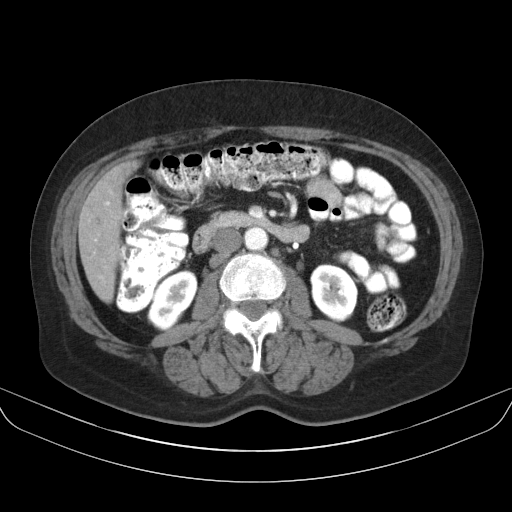
[im 55/82  bone]
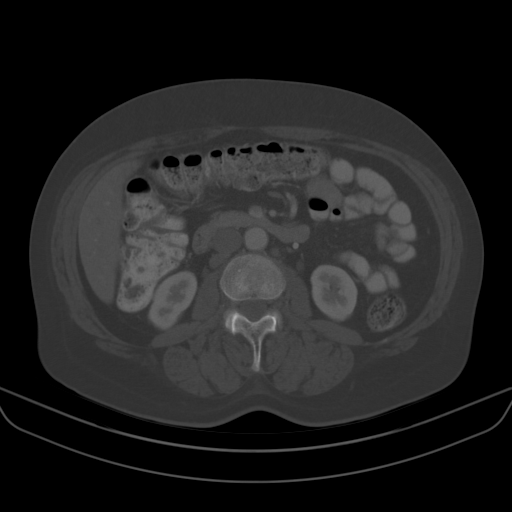
[im 59/82  soft-tissue]
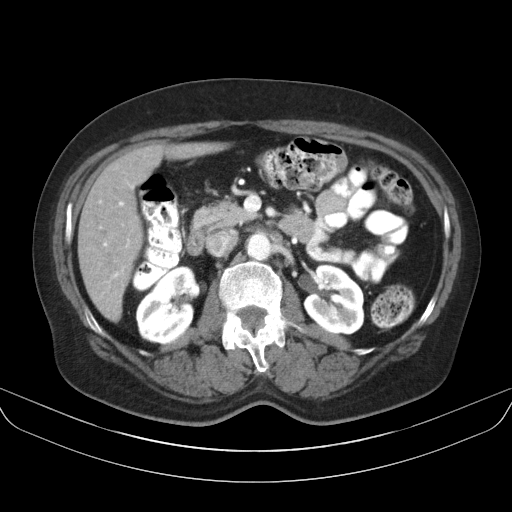
[im 64/82  soft-tissue]
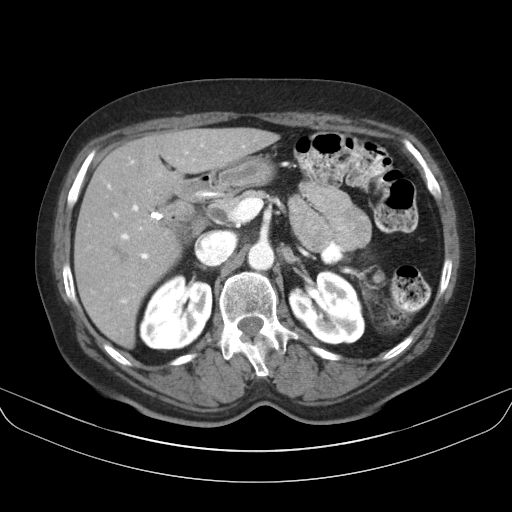
[im 64/82  lung]
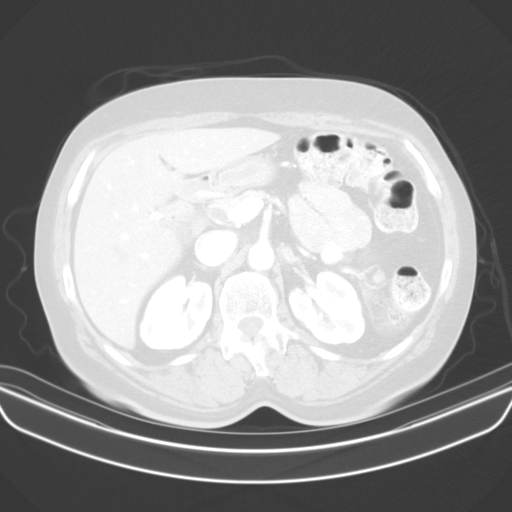
[im 68/82  lung]
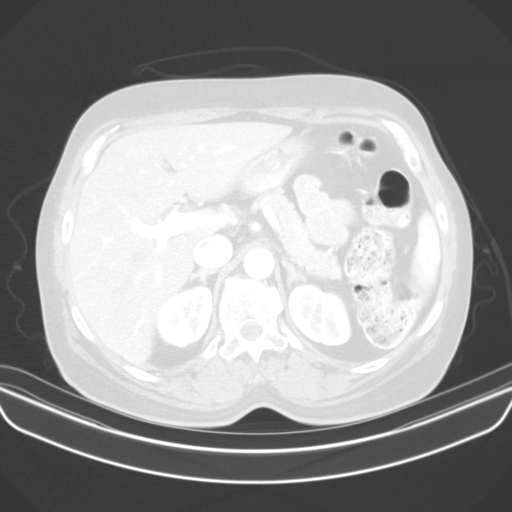
[im 73/82  soft-tissue]
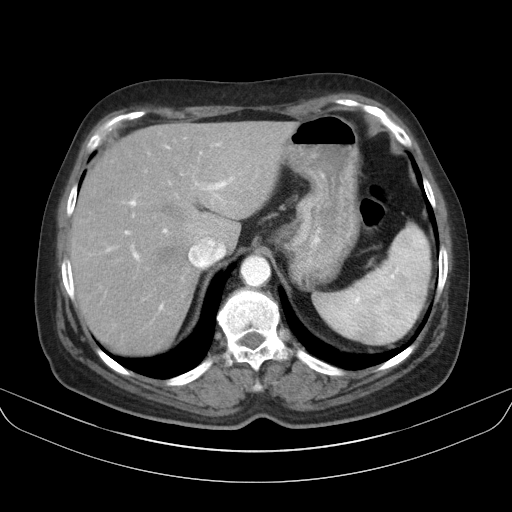
[im 73/82  lung]
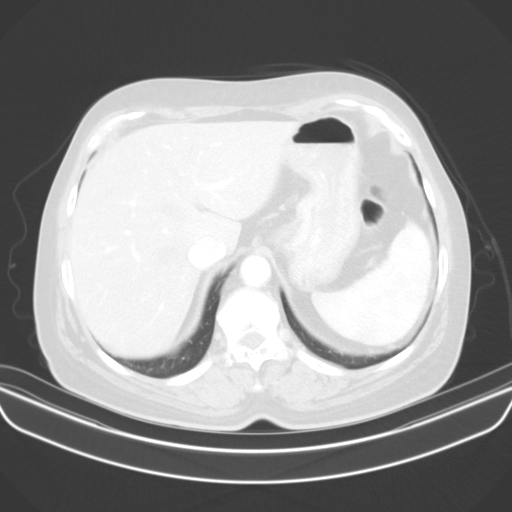
[im 77/82  soft-tissue]
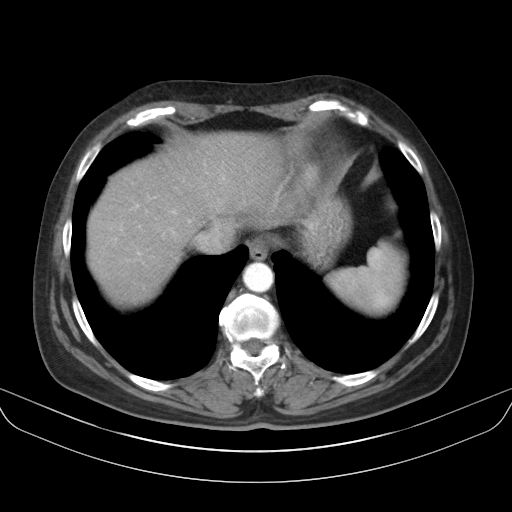
[im 77/82  lung]
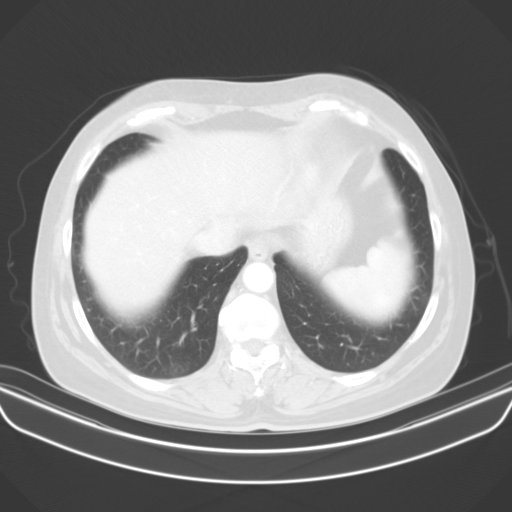

[14 of 32 positions shown; findings below may reference images not displayed]

FINDINGS: Lower Chest: No acute findings.

Hepatobiliary: No hepatic masses identified. Prior cholecystectomy.
No evidence of biliary obstruction.

Pancreas:  No mass or inflammatory changes.

Spleen: Within normal limits in size and appearance.

Adrenals/Urinary Tract: No masses identified. No evidence of
hydronephrosis.

Stomach/Bowel: No evidence of obstruction, inflammatory process or
abnormal fluid collections.

Vascular/Lymphatic: No pathologically enlarged lymph nodes. No
abdominal aortic aneurysm. Aortic atherosclerosis incidentally
noted.

Reproductive: The endometrial cavity is markedly distended by
low-attenuation substance measuring 29 mm in thickness. This is
highly suspicious for endometrial carcinoma in a postmenopausal
female. No cervical or vaginal mass identified. Adnexal regions are
unremarkable.

Other:  None.

Musculoskeletal:  No suspicious bone lesions identified.
IMPRESSION: 1. Marked diffuse endometrial thickening, highly suspicious for
endometrial carcinoma in a postmenopausal female. GYN consultation
and endometrial sampling is indicated.
2. No evidence of metastatic disease or other acute findings.

Aortic Atherosclerosis ([B5]-[B5]).

## 2019-03-15 MED ORDER — IOPAMIDOL (ISOVUE-300) INJECTION 61%
100.0000 mL | Freq: Once | INTRAVENOUS | Status: AC | PRN
  Administered 2019-03-15: 100 mL via INTRAVENOUS

## 2019-03-15 MED ORDER — ZOLPIDEM TARTRATE 10 MG PO TABS: 10.0000 mg | ORAL_TABLET | Freq: Every evening | ORAL | 0 refills | Status: DC | PRN

## 2019-03-15 NOTE — Progress Notes (Signed)
RV: follow up for hiv disease Patient ID: Courtney Hamilton, female   DOB: [birth year], 59 y.o.   MRN: [id number]  HPI Courtney Hamilton is a 59yo F with well controlled hivi dsease. Of late has been having abdominal discomfort for which she Had abdominal ct today- seen by dr outlaw. Has only had imaging and ruled out h.pylori.  Poor sleep for the last week. Has had many deaths in her family. Has had grandkids at home with her doing virtual learning. Tries also over the counter meds, sleep 3 but not useful.  Outpatient Encounter Medications as of 03/15/2019  Medication Sig  . acyclovir ointment (ZOVIRAX) 5 % Apply 1 application topically every 3 (three) hours.  Marland Kitchen albuterol (VENTOLIN HFA) 108 (90 Base) MCG/ACT inhaler Inhale 2 puffs into the lungs every 6 (six) hours as needed for wheezing or shortness of breath.  . Cyanocobalamin 1000 MCG SUBL Place 1,000 mcg under the tongue every evening.   . DESCOVY 200-25 MG tablet TAKE 1 TABLET BY MOUTH DAILY.  Marland Kitchen gabapentin (NEURONTIN) 300 MG capsule TAKE 1 CAPSULE BY MOUTH THREE TIMES A DAY  . ibuprofen (ADVIL) 800 MG tablet TAKE 1 TABLET (800 MG TOTAL) BY MOUTH EVERY 8 (EIGHT) HOURS AS NEEDED FOR MODERATE PAIN.  Marland Kitchen ipratropium (ATROVENT HFA) 17 MCG/ACT inhaler Inhale 2 puffs into the lungs every 6 (six) hours as needed (Coughing).  Marland Kitchen losartan (COZAAR) 25 MG tablet Take 1 tablet (25 mg total) by mouth daily. (Patient taking differently: Take 25 mg by mouth every evening. )  . Multiple Vitamins-Minerals (MULTIVITAMIN WITH MINERALS) tablet Take 1 tablet by mouth daily.  Marland Kitchen nystatin cream (MYCOSTATIN) APPLY TO AFFECTED AREA TWICE A DAY  . terconazole (TERAZOL 3) 0.8 % vaginal cream Place 1 applicator vaginally at bedtime. Apply nightly for three nights.  Marland Kitchen TIVICAY 50 MG tablet TAKE 1 TABLET (50 MG TOTAL) BY MOUTH DAILY.  . traMADol (ULTRAM) 50 MG tablet Take 2 tablets (100 mg total) by mouth 3 (three) times daily as needed for moderate pain.  . vitamin C (ASCORBIC ACID)  500 MG tablet Take 500 mg by mouth daily.   Facility-Administered Encounter Medications as of 03/15/2019  Medication  . acyclovir ointment (ZOVIRAX) 5 %     Patient Active Problem List   Diagnosis Date Noted  . Vulvar intraepithelial neoplasia (VIN) grade 3   . Essential hypertension 08/05/2017  . Fungal infection of skin of abdomen 06/16/2017  . Flu-like symptoms 05/25/2016  . Head injury 11/05/2015  . Insomnia 11/05/2015  . Former cigarette smoker 11/05/2015  . Chronic low back pain without sciatica 02/18/2015  . Fall 01/14/2015  . Lower abdominal pain 10/08/2014  . Generalized anxiety disorder 06/04/2014  . Anxiety state 01/07/2014  . Panic attacks 12/22/2013  . Lumbosacral spondylosis without myelopathy 09/29/2013  . 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 Lumbar paraspinal muscle spasm 01/16/2013  . Reflux 12/13/2011  . Lower leg edema 12/10/2011  . Herpes simplex 10/07/2011  . Peripheral neuropathy 10/07/2011  . Hypercholesteremia 10/07/2011  . History of tuberculosis exposure 10/01/2011  . Recurrent UTI 08/06/2011  . HIV positive ([place]) 08/03/2011  . Sleep disorder 08/03/2011  . Back pain 07/21/2011  . Osteoporosis 03/09/2008     Health Maintenance Due  Topic Date Due  . TETANUS/TDAP  05/22/2017     Review of Systems Review of Systems  Constitutional: Negative for fever, chills, diaphoresis, activity change, appetite change, fatigue and unexpected weight change.  HENT: Negative for congestion, sore throat,  rhinorrhea, sneezing, trouble swallowing and sinus pressure.  Eyes: Negative for photophobia and visual disturbance.  Respiratory: Negative for cough, chest tightness, shortness of breath, wheezing and stridor.  Cardiovascular: Negative for chest pain, palpitations and leg swelling.  Gastrointestinal: + for nausea, vomiting, abdominal pain, diarrhea, constipation, blood in stool, abdominal distention and anal bleeding.  Genitourinary: Negative for  dysuria, hematuria, flank pain and difficulty urinating.  Musculoskeletal: Negative for myalgias, back pain, joint swelling, arthralgias and gait problem.  Skin: Negative for color change, pallor, rash and wound.  Neurological: Negative for dizziness, tremors, weakness and light-headedness.  Hematological: Negative for adenopathy. Does not bruise/bleed easily.  Psychiatric/Behavioral: Negative for behavioral problems, confusion, sleep disturbance, dysphoric mood, decreased concentration and agitation.    Physical Exam   BP 113/76   Pulse 67   Temp 98 F (36.7 C)   Wt 179 lb (81.2 kg)   BMI 28.04 kg/m   Physical Exam  Constitutional:  oriented to person, place, and time. appears well-developed and well-nourished. No distress.  HENT: Courtney Hamilton, PERRLA, no scleral icterus Mouth/Throat: Oropharynx is clear and moist. No oropharyngeal exudate.  Cardiovascular: Normal rate, regular rhythm and normal heart sounds. Exam reveals no gallop and no friction rub.  No murmur heard.  Pulmonary/Chest: Effort normal and breath sounds normal. No respiratory distress.  has no wheezes.  Neck = supple, no nuchal rigidity Abdominal: Soft. Bowel sounds are normal.  exhibits no distension. There is no tenderness.  Lymphadenopathy: no cervical adenopathy. No axillary adenopathy Neurological: alert and oriented to person, place, and time.  Skin: Skin is warm and dry. No rash noted. No erythema.  Psychiatric: a normal mood and affect.  behavior is normal.   Lab Results  Component Value Date   CD4TCELL 13 (L) 04/04/2018   Lab Results  Component Value Date   CD4TABS 370 (L) 04/04/2018   CD4TABS 340 (L) 08/30/2017   CD4TABS 480 11/23/2016   Lab Results  Component Value Date   HIV1RNAQUANT <20 NOT DETECTED 04/04/2018   Lab Results  Component Value Date   HEPBSAB REACTIVE (A) 05/18/2017   Lab Results  Component Value Date   LABRPR NON-REACTIVE 04/04/2018    CBC Lab Results  Component Value Date     WBC 6.6 04/04/2018   RBC 4.55 04/04/2018   HGB 12.7 04/04/2018   HCT 38.6 04/04/2018   PLT 265 04/04/2018   MCV 84.8 04/04/2018   MCH 27.9 04/04/2018   MCHC 32.9 04/04/2018   RDW 12.8 04/04/2018   LYMPHSABS 2,680 04/04/2018   MONOABS 1,304 (H) 06/01/2016   EOSABS 376 04/04/2018    BMET Lab Results  Component Value Date   NA 140 04/04/2018   K 4.1 04/04/2018   CL 105 04/04/2018   CO2 27 04/04/2018   GLUCOSE 89 04/04/2018   BUN 17 04/04/2018   CREATININE 1.05 04/04/2018   CALCIUM 9.4 04/04/2018   GFRNONAA 58 (L) 04/04/2018   GFRAA 67 04/04/2018      Assessment and Plan  Insomnia = will do short course of ambien for now due situational insomnia  hiv disease = plan on annual labs today, but plant to continue on taking tivicay, descovy  Long term medication management = cr stable

## 2019-03-16 LAB — T-HELPER CELL (CD4) - (RCID CLINIC ONLY)
CD4 % Helper T Cell: 19 % — ABNORMAL LOW (ref 33–65)
CD4 T Cell Abs: 494 /uL (ref 400–1790)

## 2019-03-22 ENCOUNTER — Other Ambulatory Visit: Payer: Self-pay | Admitting: *Deleted

## 2019-03-22 DIAGNOSIS — G479 Sleep disorder, unspecified: Secondary | ICD-10-CM

## 2019-03-22 LAB — CBC WITH DIFFERENTIAL/PLATELET
Absolute Monocytes: 660 cells/uL (ref 200–950)
Basophils Absolute: 61 cells/uL (ref 0–200)
Basophils Relative: 0.9 %
Eosinophils Absolute: 326 cells/uL (ref 15–500)
Eosinophils Relative: 4.8 %
HCT: 39.5 % (ref 35.0–45.0)
Hemoglobin: 13 g/dL (ref 11.7–15.5)
Lymphs Abs: 2645 cells/uL (ref 850–3900)
MCH: 28.1 pg (ref 27.0–33.0)
MCHC: 32.9 g/dL (ref 32.0–36.0)
MCV: 85.5 fL (ref 80.0–100.0)
MPV: 10.1 fL (ref 7.5–12.5)
Monocytes Relative: 9.7 %
Neutro Abs: 3108 cells/uL (ref 1500–7800)
Neutrophils Relative %: 45.7 %
Platelets: 270 10*3/uL (ref 140–400)
RBC: 4.62 10*6/uL (ref 3.80–5.10)
RDW: 12.5 % (ref 11.0–15.0)
Total Lymphocyte: 38.9 %
WBC: 6.8 10*3/uL (ref 3.8–10.8)

## 2019-03-22 LAB — COMPLETE METABOLIC PANEL WITH GFR
AG Ratio: 1.5 (calc) (ref 1.0–2.5)
ALT: 9 U/L (ref 6–29)
AST: 15 U/L (ref 10–35)
Albumin: 4.3 g/dL (ref 3.6–5.1)
Alkaline phosphatase (APISO): 52 U/L (ref 37–153)
BUN: 10 mg/dL (ref 7–25)
CO2: 27 mmol/L (ref 20–32)
Calcium: 9.8 mg/dL (ref 8.6–10.4)
Chloride: 103 mmol/L (ref 98–110)
Creat: 0.91 mg/dL (ref 0.50–0.99)
GFR, Est African American: 79 mL/min/{1.73_m2} (ref >=60)
GFR, Est Non African American: 69 mL/min/{1.73_m2} (ref >=60)
Globulin: 2.9 g/dL (calc) (ref 1.9–3.7)
Glucose, Bld: 77 mg/dL (ref 65–99)
Potassium: 4.1 mmol/L (ref 3.5–5.3)
Sodium: 138 mmol/L (ref 135–146)
Total Bilirubin: 0.6 mg/dL (ref 0.2–1.2)
Total Protein: 7.2 g/dL (ref 6.1–8.1)

## 2019-03-22 LAB — HIV-1 RNA QUANT-NO REFLEX-BLD
HIV 1 RNA Quant: <20 copies/mL
HIV-1 RNA Quant, Log: <1.3 Log copies/mL

## 2019-03-22 MED ORDER — ZOLPIDEM TARTRATE 10 MG PO TABS: 10.0000 mg | ORAL_TABLET | Freq: Every evening | ORAL | 0 refills | Status: DC | PRN

## 2019-03-22 NOTE — Progress Notes (Signed)
Patient called to see if this could be called in to her pharmacy [facility] [place]. Landis Gandy, RN

## 2019-03-23 ENCOUNTER — Telehealth: Payer: Self-pay

## 2019-03-23 MED FILL — DESCOVY 200-25 MG TABS: 200-25 | 30 days supply | Qty: 30 | Fill #0

## 2019-03-23 MED FILL — TIVICAY 50 MG TABLET: 50 | 30 days supply | Qty: 30 | Fill #0

## 2019-03-23 NOTE — Telephone Encounter (Signed)
Pt called and reports that she talked to someone this morning regarding abdominal pain she has been having, pt reports it was before we opened and whoever she spoke to advised she go to the ER. Pt reports she did not go because she does not feel "comfortable going to the hospital right now". Pt reports she took Tramadol and the pain is better now, but she would like to know if there are any earlier appts she can come in for, she is currently scheduled for a virtual visit with Dr. Rosana Hoes next Wednesday. I advised pt that I will have schedulers give her a call. I also advised pt that if the pain gets worse she should go to the ER, pt voices understanding.

## 2019-03-29 ENCOUNTER — Telehealth (INDEPENDENT_AMBULATORY_CARE_PROVIDER_SITE_OTHER): Payer: Medicare HMO | Admitting: Obstetrics and Gynecology

## 2019-03-29 ENCOUNTER — Encounter: Payer: Self-pay | Admitting: Obstetrics and Gynecology

## 2019-03-29 DIAGNOSIS — Z780 Asymptomatic menopausal state: Secondary | ICD-10-CM | POA: Diagnosis not present

## 2019-03-29 DIAGNOSIS — N898 Other specified noninflammatory disorders of vagina: Secondary | ICD-10-CM

## 2019-03-29 DIAGNOSIS — R9389 Abnormal findings on diagnostic imaging of other specified body structures: Secondary | ICD-10-CM

## 2019-03-29 DIAGNOSIS — R102 Pelvic and perineal pain: Secondary | ICD-10-CM

## 2019-03-29 MED ORDER — IBUPROFEN 800 MG PO TABS: 800.0000 mg | ORAL_TABLET | Freq: Three times a day (TID) | ORAL | 0 refills | Status: DC | PRN

## 2019-03-29 NOTE — Progress Notes (Signed)
TELEHEALTH GYNECOLOGY VIRTUAL VIDEO VISIT ENCOUNTER NOTE  Provider location: [facility] at [place]   I connected with Courtney Hamilton on 03/29/19 at 10:30 AM EST by MyChart Video Encounter at home and verified that I am speaking with the correct person using two identifiers.   I discussed the limitations, risks, security and privacy concerns of performing an evaluation and management service virtually and the availability of in person appointments. I also discussed with the patient that there may be a patient responsible charge related to this service. The patient expressed understanding and agreed to proceed.   History:  Courtney Hamilton is a 59 y.o. [phone] female being evaluated today for follow up for thickened endometrium on CT. CT ordered by PCP for abdominal pain. Was feeling pain like cramping/contractions. States she feels like she has had issues with digestion and was changing her diet, pain has improved with change in diet. Also reports she had vaginal discharge as well. Has had a "gush" of "sandy-colored" discharge several times. LMP was 15 years ago, has not had any bleeding since. Has also started having hot flushes again.  She denies any abnormal bleeding, or other concerns.       Past Medical History:  Diagnosis Date  . Bronchitis winter 2018  . Herniated disc 10/07/2011   lower back  . HIV (human immunodeficiency virus infection) ([place])   . Hypertension   . Lower GI bleed 05/10/2012   pt denies  . Osteoporosis   . Ruptured lumbar disc   . Sciatica   . 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 TB (pulmonary tuberculosis) 1993    exposure, treated    Past Surgical History:  Procedure Laterality Date  . APPENDECTOMY    . BREAST EXCISIONAL BIOPSY Left 2001 per pt   cyst removed   . CHOLECYSTECTOMY    . ECTOPIC PREGNANCY SURGERY    . injections to lower back  09/2017  . SALIVARY GLAND SURGERY    . VULVECTOMY N/A  01/18/2018   Procedure: PARTIAL VULVECTOMY;  Surgeon: [name], MD;  Location: [facility];  Service: Gynecology;  Laterality: N/A;   The following portions of the patient's history were reviewed and updated as appropriate: allergies, current medications, past family history, past medical history, past social history, past surgical history and problem list.    Review of Systems:  Pertinent items noted in HPI and remainder of comprehensive ROS otherwise negative.  Physical Exam:   General:  Alert, oriented and cooperative. Patient appears to be in no acute distress.  Mental Status: Normal mood and affect. Normal behavior. Normal judgment and thought content.   Respiratory: Normal respiratory effort, no problems with respiration noted  Rest of physical exam deferred due to type of encounter  Labs and Imaging Results for orders placed or performed in visit on 03/15/19 (from the past 336 hour(s))  T-helper cell (CD4)- ([facility] clinic only)   Collection Time: 03/15/19  4:05 PM  Result Value Ref Range   CD4 T Cell Abs 494 400 - 1,790 /uL   CD4 % Helper T Cell 19 (L) 33 - 65 %  CBC with Differential - hiv   Collection Time: 03/15/19  4:13 PM  Result Value Ref Range   WBC 6.8 3.8 - 10.8 Thousand/uL   RBC 4.62 3.80 - 5.10 Million/uL   Hemoglobin 13.0 11.7 - 15.5 g/dL   HCT 39.5 35.0 - 45.0 %  MCV 85.5 80.0 - 100.0 fL   MCH 28.1 27.0 - 33.0 pg   MCHC 32.9 32.0 - 36.0 g/dL   RDW 12.5 11.0 - 15.0 %   Platelets 270 140 - 400 Thousand/uL   MPV 10.1 7.5 - 12.5 fL   Neutro Abs 3,108 1,500 - 7,800 cells/uL   Lymphs Abs 2,645 850 - 3,900 cells/uL   Absolute Monocytes 660 200 - 950 cells/uL   Eosinophils Absolute 326 15 - 500 cells/uL   Basophils Absolute 61 0 - 200 cells/uL   Neutrophils Relative % 45.7 %   Total Lymphocyte 38.9 %   Monocytes Relative 9.7 %   Eosinophils Relative 4.8 %   Basophils Relative 0.9 %  cmp - hiv   Collection Time: 03/15/19  4:13 PM    Result Value Ref Range   Glucose, Bld 77 65 - 99 mg/dL   BUN 10 7 - 25 mg/dL   Creat 0.91 0.50 - 0.99 mg/dL   GFR, Est Non African American 69 > OR = 60 mL/min/1.31m2   GFR, Est African American 79 > OR = 60 mL/min/1.51m2   BUN/Creatinine Ratio NOT APPLICABLE 6 - 22 (calc)   Sodium 138 135 - 146 mmol/L   Potassium 4.1 3.5 - 5.3 mmol/L   Chloride 103 98 - 110 mmol/L   CO2 27 20 - 32 mmol/L   Calcium 9.8 8.6 - 10.4 mg/dL   Total Protein 7.2 6.1 - 8.1 g/dL   Albumin 4.3 3.6 - 5.1 g/dL   Globulin 2.9 1.9 - 3.7 g/dL (calc)   AG Ratio 1.5 1.0 - 2.5 (calc)   Total Bilirubin 0.6 0.2 - 1.2 mg/dL   Alkaline phosphatase (APISO) 52 37 - 153 U/L   AST 15 10 - 35 U/L   ALT 9 6 - 29 U/L  HIV 1 RNA quant-no reflex-bld   Collection Time: 03/15/19  4:13 PM  Result Value Ref Range   HIV 1 RNA Quant <20 NOT DETECTED NOT DETECT copies/mL   HIV-1 RNA Quant, Log <1.30 NOT DETECTED NOT DETECT Log copies/mL   CT ABDOMEN PELVIS W CONTRAST  Result Date: 03/15/2019 CLINICAL DATA:  Lower abdominal pain for several months. Creatinine was obtained on site at [place] at [address] Results: Creatinine 0.8 mg/dL. EXAM: CT ABDOMEN AND PELVIS WITH CONTRAST TECHNIQUE: Multidetector CT imaging of the abdomen and pelvis was performed using the standard protocol following bolus administration of intravenous contrast. CONTRAST:  121mL ISOVUE-300 IOPAMIDOL (ISOVUE-300) INJECTION 61% COMPARISON:  05/25/2012 FINDINGS: Lower Chest: No acute findings. Hepatobiliary: No hepatic masses identified. Prior cholecystectomy. No evidence of biliary obstruction. Pancreas:  No mass or inflammatory changes. Spleen: Within normal limits in size and appearance. Adrenals/Urinary Tract: No masses identified. No evidence of hydronephrosis. Stomach/Bowel: No evidence of obstruction, inflammatory process or abnormal fluid collections. Vascular/Lymphatic: No pathologically enlarged lymph nodes. No abdominal aortic aneurysm. Aortic  atherosclerosis incidentally noted. Reproductive: The endometrial cavity is markedly distended by low-attenuation substance measuring 29 mm in thickness. This is highly suspicious for endometrial carcinoma in a postmenopausal female. No cervical or vaginal mass identified. Adnexal regions are unremarkable. Other:  None. Musculoskeletal:  No suspicious bone lesions identified. IMPRESSION: 1. Marked diffuse endometrial thickening, highly suspicious for endometrial carcinoma in a postmenopausal female. GYN consultation and endometrial sampling is indicated. 2. No evidence of metastatic disease or other acute findings. Aortic Atherosclerosis (ICD10-I70.0). Electronically Signed   By: Marlaine Hind M.D.   On: 03/15/2019 16:03  Assessment and Plan:   1. Pelvic pain Improved with diet change  2. Thickened endometrium - Reviewed possibility of malignancy, need for sampling, she is agreeable - return to office for EMB  3. Post-menopausal Hot flushes have returned Not taking meds at this time  4. Vaginal discharge Will check for infection at visit    I discussed the assessment and treatment plan with the patient. The patient was provided an opportunity to ask questions and all were answered. The patient agreed with the plan and demonstrated an understanding of the instructions.   The patient was advised to call back or seek an in-person evaluation/go to the ED if the symptoms worsen or if the condition fails to improve as anticipated.  I provided 20 minutes of face-to-face time during this encounter.   Sloan Leiter, MD [facility], [place]

## 2019-03-29 NOTE — Progress Notes (Signed)
Virtual Visit to discuss results of CT on 03/15/19.

## 2019-04-24 MED FILL — DESCOVY 200-25 MG TABS: 200-25 | 30 days supply | Qty: 30 | Fill #1

## 2019-04-24 MED FILL — TIVICAY 50 MG TABLET: 50 | 30 days supply | Qty: 30 | Fill #1

## 2019-04-25 ENCOUNTER — Other Ambulatory Visit: Payer: Self-pay

## 2019-04-25 ENCOUNTER — Other Ambulatory Visit (HOSPITAL_COMMUNITY)
Admission: RE | Admit: 2019-04-25 | Discharge: 2019-04-25 | Disposition: A | Discharge status: Home / Self Care | Payer: Medicare HMO | Source: Ambulatory Visit | Attending: Obstetrics and Gynecology | Admitting: Obstetrics and Gynecology

## 2019-04-25 ENCOUNTER — Encounter: Payer: Self-pay | Admitting: Obstetrics and Gynecology

## 2019-04-25 ENCOUNTER — Ambulatory Visit (INDEPENDENT_AMBULATORY_CARE_PROVIDER_SITE_OTHER): Payer: Medicare HMO | Admitting: Obstetrics and Gynecology

## 2019-04-25 VITALS — BP 122/81 | HR 85 | Ht 66.0 in | Wt 179.0 lb

## 2019-04-25 DIAGNOSIS — B9689 Other specified bacterial agents as the cause of diseases classified elsewhere: Secondary | ICD-10-CM | POA: Diagnosis not present

## 2019-04-25 DIAGNOSIS — R9389 Abnormal findings on diagnostic imaging of other specified body structures: Secondary | ICD-10-CM

## 2019-04-25 DIAGNOSIS — N760 Acute vaginitis: Secondary | ICD-10-CM | POA: Insufficient documentation

## 2019-04-25 DIAGNOSIS — Z1231 Encounter for screening mammogram for malignant neoplasm of breast: Secondary | ICD-10-CM | POA: Diagnosis not present

## 2019-04-25 DIAGNOSIS — N898 Other specified noninflammatory disorders of vagina: Secondary | ICD-10-CM | POA: Diagnosis present

## 2019-04-25 MED ORDER — MISOPROSTOL 200 MCG PO TABS: ORAL_TABLET | ORAL | 1 refills | Status: DC

## 2019-04-25 NOTE — Progress Notes (Signed)
GYN presents for Biopsy.  C/o itching x 1 week. Denies discharge.

## 2019-04-25 NOTE — Addendum Note (Signed)
Addended by: Tamela Oddi on: 04/25/2019 02:46 PM   Modules accepted: Orders

## 2019-04-25 NOTE — Progress Notes (Signed)
59 yo postmenopausal presenting today for endometrial biopsy. Patient was noted to have a thickened endometrium on CT during a work-up for abdominal pain.  Past Medical History:  Diagnosis Date  . Bronchitis winter 2018  . Herniated disc 10/07/2011   lower back  . HIV (human immunodeficiency virus infection) ([place])   . Hypertension   . Lower GI bleed 05/10/2012   pt denies  . Osteoporosis   . Ruptured lumbar disc   . Sciatica   . 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 TB (pulmonary tuberculosis) 1993    exposure, treated    Past Surgical History:  Procedure Laterality Date  . APPENDECTOMY    . BREAST EXCISIONAL BIOPSY Left 2001 per pt   cyst removed   . CHOLECYSTECTOMY    . ECTOPIC PREGNANCY SURGERY    . injections to lower back  09/2017  . SALIVARY GLAND SURGERY    . VULVECTOMY N/A 01/18/2018   Procedure: PARTIAL VULVECTOMY;  Surgeon: [name], MD;  Location: [facility];  Service: Gynecology;  Laterality: N/A;   Family History  Problem Relation Age of Onset  . Asthma Father   . COPD Father   . Cancer Mother   . Diabetes Brother   . Hypertension Brother   . Diabetes Brother   . Breast cancer Sister   . Colon cancer Maternal Uncle    Social History   Tobacco Use  . Smoking status: Former Smoker    Packs/day: 0.50    Years: 30.00    Pack years: 15.00    Types: Cigarettes  . Smokeless tobacco: Never Used  . Tobacco comment: quit march 2018  Substance Use Topics  . Alcohol use: No  . Drug use: No    Comment: past history of cocaine  and alcohol  none in 20 years   ROS See pertinent in HPI. All other systems reviewed and negative  Blood pressure 122/81, pulse 85, height 5\' 6"  (1.676 m), weight 179 lb (81.2 kg). GENERAL: Well-developed, well-nourished female in no acute distress.  ABDOMEN: Soft, nontender, nondistended. No organomegaly. PELVIC: Normal external female genitalia. Vagina is pale and  atrophic.  Normal discharge. Uterus is normal in size. No adnexal mass or tenderness. Difficult to visualize cervix without causing patient discomfort. Cervix is very posterior EXTREMITIES: No cyanosis, clubbing, or edema, 2+ distal pulses.  CT ABDOMEN PELVIS W CONTRAST  Result Date: 03/15/2019 CLINICAL DATA:  Lower abdominal pain for several months. Creatinine was obtained on site at [place] at [address] Results: Creatinine 0.8 mg/dL. EXAM: CT ABDOMEN AND PELVIS WITH CONTRAST TECHNIQUE: Multidetector CT imaging of the abdomen and pelvis was performed using the standard protocol following bolus administration of intravenous contrast. CONTRAST:  18mL ISOVUE-300 IOPAMIDOL (ISOVUE-300) INJECTION 61% COMPARISON:  05/25/2012 FINDINGS: Lower Chest: No acute findings. Hepatobiliary: No hepatic masses identified. Prior cholecystectomy. No evidence of biliary obstruction. Pancreas:  No mass or inflammatory changes. Spleen: Within normal limits in size and appearance. Adrenals/Urinary Tract: No masses identified. No evidence of hydronephrosis. Stomach/Bowel: No evidence of obstruction, inflammatory process or abnormal fluid collections. Vascular/Lymphatic: No pathologically enlarged lymph nodes. No abdominal aortic aneurysm. Aortic atherosclerosis incidentally noted. Reproductive: The endometrial cavity is markedly distended by low-attenuation substance measuring 29 mm in thickness. This is highly suspicious for endometrial carcinoma in a postmenopausal female. No cervical or vaginal mass identified. Adnexal regions are unremarkable. Other:  None. Musculoskeletal:  No suspicious bone lesions  identified. IMPRESSION: 1. Marked diffuse endometrial thickening, highly suspicious for endometrial carcinoma in a postmenopausal female. GYN consultation and endometrial sampling is indicated. 2. No evidence of metastatic disease or other acute findings. Aortic Atherosclerosis (ICD10-I70.0). Electronically  Signed   By: Marlaine Hind M.D.   On: 03/15/2019 16:03     A/P 59 yo postmenopausal with thickened endometrium - Discussed benefits of endometrial biopsy - Due to inability to visualize cervix. Discussed performing a D&C with hysteroscopy under anesthesia. Risks, benefits and alternatives were explained including but not limited to risks of bleeding, infection, uterine perforation and damage to adjacent organs. Patient verbalized understanding and all questions were answered Rx cytotec was provided - Patient with normal pap smear in 2019 - screening mammogram ordered

## 2019-04-26 LAB — CERVICOVAGINAL ANCILLARY ONLY
Bacterial Vaginitis (gardnerella): POSITIVE — AB
Candida Glabrata: NEGATIVE
Candida Vaginitis: NEGATIVE
Chlamydia: NEGATIVE
Comment: NEGATIVE
Comment: NEGATIVE
Comment: NEGATIVE
Comment: NEGATIVE
Comment: NEGATIVE
Comment: NORMAL
Neisseria Gonorrhea: NEGATIVE
Trichomonas: NEGATIVE

## 2019-04-26 MED ORDER — METRONIDAZOLE 500 MG PO TABS: 500.0000 mg | ORAL_TABLET | Freq: Two times a day (BID) | ORAL | 0 refills | Status: DC

## 2019-04-26 NOTE — Addendum Note (Signed)
Addended by: Mora Bellman on: 04/26/2019 02:43 PM   Modules accepted: Orders

## 2019-05-04 ENCOUNTER — Other Ambulatory Visit: Payer: Self-pay | Admitting: Obstetrics and Gynecology

## 2019-05-18 ENCOUNTER — Telehealth: Payer: Self-pay

## 2019-05-18 NOTE — Telephone Encounter (Signed)
Returned call and advised that it is ok to get covid vaccine before procedure.

## 2019-05-19 ENCOUNTER — Other Ambulatory Visit: Payer: Self-pay | Admitting: Obstetrics and Gynecology

## 2019-05-19 ENCOUNTER — Telehealth: Payer: Self-pay

## 2019-05-19 MED ORDER — IBUPROFEN 600 MG PO TABS: 600.0000 mg | ORAL_TABLET | Freq: Four times a day (QID) | ORAL | 1 refills | Status: DC | PRN

## 2019-05-19 NOTE — Telephone Encounter (Signed)
error 

## 2019-05-19 NOTE — Telephone Encounter (Signed)
Returned call, answered questions about upcoming procedure, pt requesting refill on ibuprofen 800mg  to manage pain, routed to provider.

## 2019-05-22 ENCOUNTER — Other Ambulatory Visit: Payer: Self-pay | Admitting: Physical Medicine & Rehabilitation

## 2019-05-24 MED FILL — DESCOVY 200-25 MG TABS: 200-25 | 30 days supply | Qty: 30 | Fill #2

## 2019-05-24 MED FILL — TIVICAY 50 MG TABLET: 50 | 30 days supply | Qty: 30 | Fill #2

## 2019-06-02 ENCOUNTER — Other Ambulatory Visit: Payer: Self-pay | Admitting: Obstetrics and Gynecology

## 2019-06-05 ENCOUNTER — Other Ambulatory Visit: Payer: Self-pay

## 2019-06-05 ENCOUNTER — Encounter (HOSPITAL_BASED_OUTPATIENT_CLINIC_OR_DEPARTMENT_OTHER): Payer: Self-pay | Admitting: Obstetrics and Gynecology

## 2019-06-06 ENCOUNTER — Telehealth: Payer: Self-pay | Admitting: *Deleted

## 2019-06-06 ENCOUNTER — Telehealth: Payer: Self-pay

## 2019-06-06 NOTE — Telephone Encounter (Signed)
Pt left message on nurse VM @ [facility]. She stated that she is having a procedure with Dr. Elly Modena next week and thinks she may be having a bad reaction to a medication. Please call back.

## 2019-06-06 NOTE — Telephone Encounter (Signed)
Returned patient phone call and pt reports that she is having abdominal cramping and headaches. Pt reports normal BP today. Pt reports she thinks it may be the Estradiol medication she started taking last Thursday. I advised pt to stop the medication and to go to the hospital for the pain. Pt voices understanding.

## 2019-06-07 ENCOUNTER — Other Ambulatory Visit: Payer: Self-pay

## 2019-06-07 ENCOUNTER — Ambulatory Visit
Admission: RE | Admit: 2019-06-07 | Discharge: 2019-06-07 | Disposition: A | Discharge status: Home / Self Care | Payer: Medicare HMO | Source: Ambulatory Visit | Attending: Obstetrics and Gynecology | Admitting: Obstetrics and Gynecology

## 2019-06-07 ENCOUNTER — Other Ambulatory Visit: Payer: Self-pay | Admitting: Obstetrics and Gynecology

## 2019-06-07 ENCOUNTER — Encounter (HOSPITAL_BASED_OUTPATIENT_CLINIC_OR_DEPARTMENT_OTHER)
Admission: RE | Admit: 2019-06-07 | Discharge: 2019-06-07 | Disposition: A | Discharge status: Home / Self Care | Payer: Medicare HMO | Source: Ambulatory Visit | Attending: Obstetrics and Gynecology | Admitting: Obstetrics and Gynecology

## 2019-06-07 DIAGNOSIS — Z1231 Encounter for screening mammogram for malignant neoplasm of breast: Secondary | ICD-10-CM

## 2019-06-07 DIAGNOSIS — Z01812 Encounter for preprocedural laboratory examination: Secondary | ICD-10-CM | POA: Insufficient documentation

## 2019-06-07 NOTE — Progress Notes (Signed)

## 2019-06-10 ENCOUNTER — Other Ambulatory Visit (HOSPITAL_COMMUNITY)
Admission: RE | Admit: 2019-06-10 | Discharge: 2019-06-10 | Disposition: A | Discharge status: Home / Self Care | Payer: Medicare HMO | Source: Ambulatory Visit | Attending: Obstetrics and Gynecology | Admitting: Obstetrics and Gynecology

## 2019-06-10 DIAGNOSIS — Z01812 Encounter for preprocedural laboratory examination: Secondary | ICD-10-CM | POA: Insufficient documentation

## 2019-06-10 DIAGNOSIS — Z20822 Contact with and (suspected) exposure to covid-19: Secondary | ICD-10-CM | POA: Diagnosis not present

## 2019-06-10 LAB — SARS CORONAVIRUS 2 (TAT 6-24 HRS): SARS Coronavirus 2: NEGATIVE

## 2019-06-13 NOTE — Anesthesia Preprocedure Evaluation (Addendum)
Anesthesia Evaluation  Patient identified by MRN, date of birth, ID band Patient awake    Reviewed: Allergy & Precautions, NPO status , Patient's Chart, lab work & pertinent test results  History of Anesthesia Complications Negative for: history of anesthetic complications  Airway Mallampati: II  TM Distance: >3 FB Neck ROM: Full    Dental  (+) Dental Advisory Given, Upper Dentures   Pulmonary former smoker,    Pulmonary exam normal breath sounds clear to auscultation       Cardiovascular hypertension, Pt. on medications Normal cardiovascular exam Rhythm:Regular Rate:Normal     Neuro/Psych PSYCHIATRIC DISORDERS Anxiety  Neuromuscular disease    GI/Hepatic negative GI ROS, (+)     substance abuse (past history)  ,   Endo/Other  negative endocrine ROS  Renal/GU negative Renal ROS     Musculoskeletal negative musculoskeletal ROS (+) Arthritis ,   Abdominal   Peds  Hematology  (+) HIV,   Anesthesia Other Findings Day of surgery medications reviewed with the patient.  Reproductive/Obstetrics                            Anesthesia Physical Anesthesia Plan  ASA: III  Anesthesia Plan: General   Post-op Pain Management:    Induction: Intravenous  PONV Risk Score and Plan: 4 or greater and Scopolamine patch - Pre-op, Midazolam, Dexamethasone and Ondansetron  Airway Management Planned: LMA  Additional Equipment:   Intra-op Plan:   Post-operative Plan: Extubation in OR  Informed Consent: I have reviewed the patients History and Physical, chart, labs and discussed the procedure including the risks, benefits and alternatives for the proposed anesthesia with the patient or authorized representative who has indicated his/her understanding and acceptance.     Dental advisory given  Plan Discussed with: CRNA  Anesthesia Plan Comments:        Anesthesia Quick Evaluation

## 2019-06-13 NOTE — H&P (Signed)
Courtney Hamilton is an 59 y.o. female here for scheduled dilatation, currettage with hysteroscopy. Patient noted to have a thickened endometrium on CT scan during a work up for abdominal pain. Patient was noted to have an atrophic cervix during attempted endometrial biopsy in the office. Patient scheduled for exam under anesthesia and endometrial sampling. Patient is without any complaints and denies any complaints of postmenopausal vaginal bleeding.   Menstrual History: No LMP recorded. Patient is postmenopausal.    Past Medical History:  Diagnosis Date  . Bronchitis winter 2018  . Herniated disc 10/07/2011   lower back  . HIV (human immunodeficiency virus infection) ([place])   . Hypertension   . Lower GI bleed 05/10/2012   pt denies  . Osteoporosis   . Ruptured lumbar disc   . Sciatica   . 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 TB (pulmonary tuberculosis) 1993    exposure, treated     Past Surgical History:  Procedure Laterality Date  . APPENDECTOMY    . BREAST EXCISIONAL BIOPSY Left 2001 per pt   cyst removed   . CHOLECYSTECTOMY    . ECTOPIC PREGNANCY SURGERY    . injections to lower back  09/2017  . SALIVARY GLAND SURGERY    . VULVECTOMY N/A 01/18/2018   Procedure: PARTIAL VULVECTOMY;  Surgeon: [name], MD;  Location: [facility];  Service: Gynecology;  Laterality: N/A;    Family History  Problem Relation Age of Onset  . Asthma Father   . COPD Father   . Cancer Mother   . Diabetes Brother   . Hypertension Brother   . Diabetes Brother   . Breast cancer Sister   . Colon cancer Maternal Uncle     Social History:  reports that she has quit smoking. Her smoking use included cigarettes. She has a 15.00 pack-year smoking history. She has never used smokeless tobacco. She reports that she does not drink alcohol or use drugs.  Allergies:  Allergies  Allergen Reactions  . Baclofen Other (See Comments)    rash  .  Bactrim [Sulfamethoxazole-Trimethoprim] Rash  . Doxycycline Rash    Facility-Administered Medications Prior to Admission  Medication Dose Route Frequency Provider Last Rate Last Admin  . acyclovir ointment (ZOVIRAX) 5 %   Topical Q3H Sloan Leiter, MD       Medications Prior to Admission  Medication Sig Dispense Refill Last Dose  . acyclovir ointment (ZOVIRAX) 5 % Apply 1 application topically every 3 (three) hours. 30 g 0 Past Week at Unknown time  . Cyanocobalamin 1000 MCG SUBL Place 1,000 mcg under the tongue every evening.    Past Week at Unknown time  . DESCOVY 200-25 MG tablet TAKE 1 TABLET BY MOUTH DAILY. 30 tablet 5 06/13/2019 at Unknown time  . gabapentin (NEURONTIN) 300 MG capsule TAKE 1 CAPSULE BY MOUTH THREE TIMES A DAY 270 capsule 1 Past Week at Unknown time  . ibuprofen (ADVIL) 600 MG tablet Take 1 tablet (600 mg total) by mouth every 6 (six) hours as needed. 30 tablet 1 Past Week at Unknown time  . ibuprofen (ADVIL) 800 MG tablet TAKE 1 TABLET (800 MG TOTAL) BY MOUTH EVERY 8 (EIGHT) HOURS AS NEEDED FOR MODERATE PAIN. 30 tablet 0 Past Week at Unknown time  . ibuprofen (ADVIL) 800 MG tablet Take 1 tablet (800 mg total) by mouth every 8 (eight) hours as needed. 30 tablet 0 Past Week at Unknown time  .  losartan (COZAAR) 25 MG tablet Take 1 tablet (25 mg total) by mouth daily. (Patient taking differently: Take 25 mg by mouth every evening. ) 30 tablet 11 06/13/2019 at Unknown time  . misoprostol (CYTOTEC) 200 MCG tablet Iinsert four tablets vaginally the night prior to your appointment 4 tablet 1 06/13/2019 at Unknown time  . Multiple Vitamins-Minerals (MULTIVITAMIN WITH MINERALS) tablet Take 1 tablet by mouth daily.   Past Week at Unknown time  . TIVICAY 50 MG tablet TAKE 1 TABLET (50 MG TOTAL) BY MOUTH DAILY. 30 tablet 5 06/13/2019 at Unknown time  . traMADol (ULTRAM) 50 MG tablet Take 2 tablets (100 mg total) by mouth 3 (three) times daily as needed for moderate pain. 15 tablet 0 06/13/2019  at Unknown time  . vitamin C (ASCORBIC ACID) 500 MG tablet Take 500 mg by mouth daily.   06/05/2019 at Unknown time  . terconazole (TERAZOL 3) 0.8 % vaginal cream Place 1 applicator vaginally at bedtime. Apply nightly for three nights. 20 g 0   . zolpidem (AMBIEN) 10 MG tablet Take 1 tablet (10 mg total) by mouth at bedtime as needed for up to 20 days for sleep. 30 tablet 0     Review of Systems See pertinent in HPI. All other systems reviewed and negative Blood pressure 134/84, pulse 68, temperature (!) 97.3 F (36.3 C), temperature source Tympanic, resp. rate 16, height 5\' 6"  (1.676 m), weight 83.5 kg, SpO2 100 %. Physical Exam GENERAL: Well-developed, well-nourished female in no acute distress.  HEENT: Normocephalic, atraumatic. Sclerae anicteric.  NECK: Supple. Normal thyroid.  LUNGS: Clear to auscultation bilaterally.  HEART: Regular rate and rhythm. ABDOMEN: Soft, nontender, nondistended. No organomegaly. PELVIC: Deferred to OR EXTREMITIES: No cyanosis, clubbing, or edema, 2+ distal pulses.  No results found for this or any previous visit (from the past 24 hour(s)).  No results found.  Assessment/Plan: 59 yo postmenopausal with thickened endometrium here for D&C - Risks, benefits and alternatives were explained including but not limited to risks of bleeding, infection, uterine perforation and damage to adjacent organs - Patient verbalized understanding and all questions were answered Courtney Hamilton 06/14/2019, 8:13 AM

## 2019-06-14 ENCOUNTER — Ambulatory Visit (HOSPITAL_BASED_OUTPATIENT_CLINIC_OR_DEPARTMENT_OTHER): Payer: Medicare HMO | Admitting: Anesthesiology

## 2019-06-14 ENCOUNTER — Encounter (HOSPITAL_BASED_OUTPATIENT_CLINIC_OR_DEPARTMENT_OTHER)
Admission: RE | Disposition: A | Discharge status: Home / Self Care | Payer: Self-pay | Source: Home / Self Care | Attending: Obstetrics and Gynecology

## 2019-06-14 ENCOUNTER — Ambulatory Visit (HOSPITAL_BASED_OUTPATIENT_CLINIC_OR_DEPARTMENT_OTHER)
Admission: RE | Admit: 2019-06-14 | Discharge: 2019-06-14 | Disposition: A | Discharge status: Home / Self Care | Payer: Medicare HMO | Attending: Obstetrics and Gynecology | Admitting: Obstetrics and Gynecology

## 2019-06-14 ENCOUNTER — Telehealth: Payer: Self-pay

## 2019-06-14 ENCOUNTER — Other Ambulatory Visit: Payer: Self-pay

## 2019-06-14 ENCOUNTER — Encounter (HOSPITAL_BASED_OUTPATIENT_CLINIC_OR_DEPARTMENT_OTHER): Payer: Self-pay | Admitting: Obstetrics and Gynecology

## 2019-06-14 DIAGNOSIS — R9389 Abnormal findings on diagnostic imaging of other specified body structures: Secondary | ICD-10-CM | POA: Insufficient documentation

## 2019-06-14 DIAGNOSIS — Z79899 Other long term (current) drug therapy: Secondary | ICD-10-CM | POA: Insufficient documentation

## 2019-06-14 DIAGNOSIS — N882 Stricture and stenosis of cervix uteri: Secondary | ICD-10-CM | POA: Insufficient documentation

## 2019-06-14 DIAGNOSIS — I10 Essential (primary) hypertension: Secondary | ICD-10-CM | POA: Diagnosis not present

## 2019-06-14 DIAGNOSIS — Z87891 Personal history of nicotine dependence: Secondary | ICD-10-CM | POA: Diagnosis not present

## 2019-06-14 DIAGNOSIS — Z21 Asymptomatic human immunodeficiency virus [HIV] infection status: Secondary | ICD-10-CM | POA: Diagnosis not present

## 2019-06-14 DIAGNOSIS — Z538 Procedure and treatment not carried out for other reasons: Secondary | ICD-10-CM | POA: Diagnosis not present

## 2019-06-14 SURGERY — EXAM UNDER ANESTHESIA
Anesthesia: General | Site: Vagina

## 2019-06-14 MED ORDER — DEXAMETHASONE SODIUM PHOSPHATE 4 MG/ML IJ SOLN
INTRAMUSCULAR | Status: DC | PRN
  Administered 2019-06-14: 10 mg via INTRAVENOUS

## 2019-06-14 MED ORDER — LIDOCAINE 2% (20 MG/ML) 5 ML SYRINGE
INTRAMUSCULAR | Status: DC | PRN
  Administered 2019-06-14: 100 mg via INTRAVENOUS

## 2019-06-14 MED ORDER — SUCCINYLCHOLINE CHLORIDE 200 MG/10ML IV SOSY
PREFILLED_SYRINGE | INTRAVENOUS | Status: AC
  Filled 2019-06-14: qty 10

## 2019-06-14 MED ORDER — LIDOCAINE 2% (20 MG/ML) 5 ML SYRINGE
INTRAMUSCULAR | Status: AC
  Filled 2019-06-14: qty 5

## 2019-06-14 MED ORDER — MIDAZOLAM HCL 2 MG/2ML IJ SOLN
1.0000 mg | INTRAMUSCULAR | Status: DC | PRN
  Administered 2019-06-14: 2 mg via INTRAVENOUS

## 2019-06-14 MED ORDER — OXYCODONE HCL 5 MG PO TABS
5.0000 mg | ORAL_TABLET | Freq: Once | ORAL | Status: AC
  Administered 2019-06-14: 10:00:00 5 mg via ORAL

## 2019-06-14 MED ORDER — FENTANYL CITRATE (PF) 100 MCG/2ML IJ SOLN
INTRAMUSCULAR | Status: AC
  Filled 2019-06-14: qty 2

## 2019-06-14 MED ORDER — PHENYLEPHRINE 40 MCG/ML (10ML) SYRINGE FOR IV PUSH (FOR BLOOD PRESSURE SUPPORT)
PREFILLED_SYRINGE | INTRAVENOUS | Status: AC
  Filled 2019-06-14: qty 10

## 2019-06-14 MED ORDER — SCOPOLAMINE 1 MG/3DAYS TD PT72
1.0000 | MEDICATED_PATCH | Freq: Once | TRANSDERMAL | Status: DC
  Administered 2019-06-14: 1.5 mg via TRANSDERMAL

## 2019-06-14 MED ORDER — PHENYLEPHRINE HCL (PRESSORS) 10 MG/ML IV SOLN
INTRAVENOUS | Status: DC | PRN
  Administered 2019-06-14: 40 ug via INTRAVENOUS

## 2019-06-14 MED ORDER — OXYCODONE-ACETAMINOPHEN 5-325 MG PO TABS: 1.0000 | ORAL_TABLET | Freq: Four times a day (QID) | ORAL | 0 refills | Status: DC | PRN

## 2019-06-14 MED ORDER — CHLOROPROCAINE HCL 1 % IJ SOLN
INTRAMUSCULAR | Status: AC
  Filled 2019-06-14: qty 30

## 2019-06-14 MED ORDER — ONDANSETRON HCL 4 MG/2ML IJ SOLN
INTRAMUSCULAR | Status: DC | PRN
  Administered 2019-06-14: 4 mg via INTRAVENOUS

## 2019-06-14 MED ORDER — SILVER NITRATE-POT NITRATE 75-25 % EX MISC
CUTANEOUS | Status: AC
  Filled 2019-06-14: qty 10

## 2019-06-14 MED ORDER — DEXAMETHASONE SODIUM PHOSPHATE 10 MG/ML IJ SOLN
INTRAMUSCULAR | Status: AC
  Filled 2019-06-14: qty 1

## 2019-06-14 MED ORDER — ACETAMINOPHEN 500 MG PO TABS
1000.0000 mg | ORAL_TABLET | Freq: Once | ORAL | Status: AC
  Administered 2019-06-14: 07:00:00 1000 mg via ORAL

## 2019-06-14 MED ORDER — EPHEDRINE SULFATE 50 MG/ML IJ SOLN
INTRAMUSCULAR | Status: DC | PRN
  Administered 2019-06-14 (×2): 10 mg via INTRAVENOUS

## 2019-06-14 MED ORDER — FENTANYL CITRATE (PF) 100 MCG/2ML IJ SOLN: 25.0000 ug | INTRAMUSCULAR | Status: DC | PRN

## 2019-06-14 MED ORDER — LACTATED RINGERS IV SOLN: INTRAVENOUS | Status: DC

## 2019-06-14 MED ORDER — FENTANYL CITRATE (PF) 100 MCG/2ML IJ SOLN
50.0000 ug | INTRAMUSCULAR | Status: DC | PRN
  Administered 2019-06-14 (×2): 50 ug via INTRAVENOUS

## 2019-06-14 MED ORDER — EPHEDRINE 5 MG/ML INJ
INTRAVENOUS | Status: AC
  Filled 2019-06-14: qty 10

## 2019-06-14 MED ORDER — ONDANSETRON HCL 4 MG/2ML IJ SOLN
INTRAMUSCULAR | Status: AC
  Filled 2019-06-14: qty 2

## 2019-06-14 MED ORDER — MISOPROSTOL 200 MCG PO TABS
ORAL_TABLET | ORAL | Status: AC
  Filled 2019-06-14: qty 5

## 2019-06-14 MED ORDER — SCOPOLAMINE 1 MG/3DAYS TD PT72
MEDICATED_PATCH | TRANSDERMAL | Status: AC
  Filled 2019-06-14: qty 1

## 2019-06-14 MED ORDER — OXYCODONE HCL 5 MG PO TABS
ORAL_TABLET | ORAL | Status: AC
  Filled 2019-06-14: qty 1

## 2019-06-14 MED ORDER — ACETAMINOPHEN 500 MG PO TABS
ORAL_TABLET | ORAL | Status: AC
  Filled 2019-06-14: qty 2

## 2019-06-14 MED ORDER — DIPHENHYDRAMINE HCL 50 MG/ML IJ SOLN
INTRAMUSCULAR | Status: AC
  Filled 2019-06-14: qty 1

## 2019-06-14 MED ORDER — MIDAZOLAM HCL 2 MG/2ML IJ SOLN
INTRAMUSCULAR | Status: AC
  Filled 2019-06-14: qty 2

## 2019-06-14 MED ORDER — PROMETHAZINE HCL 25 MG/ML IJ SOLN: 6.2500 mg | INTRAMUSCULAR | Status: DC | PRN

## 2019-06-14 MED ORDER — PROPOFOL 10 MG/ML IV BOLUS
INTRAVENOUS | Status: DC | PRN
  Administered 2019-06-14: 170 mg via INTRAVENOUS

## 2019-06-14 SURGICAL SUPPLY — 13 items
CATH ROBINSON RED A/P 16FR (CATHETERS) ×2 IMPLANT
GLOVE BIOGEL PI IND STRL 6.5 (GLOVE) ×2 IMPLANT
GLOVE BIOGEL PI IND STRL 7.0 (GLOVE) ×2 IMPLANT
GLOVE BIOGEL PI INDICATOR 6.5 (GLOVE) ×2
GLOVE BIOGEL PI INDICATOR 7.0 (GLOVE) ×2
GLOVE SURG SS PI 6.0 STRL IVOR (GLOVE) ×4 IMPLANT
GOWN STRL REUS W/TWL LRG LVL3 (GOWN DISPOSABLE) ×10 IMPLANT
KIT PROCEDURE FLUENT (KITS) ×4 IMPLANT
PACK VAGINAL MINOR WOMEN LF (CUSTOM PROCEDURE TRAY) ×4 IMPLANT
PAD OB MATERNITY 4.3X12.25 (PERSONAL CARE ITEMS) ×4 IMPLANT
PAD PREP 24X48 CUFFED NSTRL (MISCELLANEOUS) ×4 IMPLANT
SLEEVE SCD COMPRESS KNEE MED (MISCELLANEOUS) ×6 IMPLANT
TOWEL GREEN STERILE FF (TOWEL DISPOSABLE) ×8 IMPLANT

## 2019-06-14 NOTE — Op Note (Signed)
PROCEDURE DATE: 06/14/2019  PREOPERATIVE DIAGNOSIS: Thickened endometrium in postmenopausal state POSTOPERATIVE DIAGNOSIS: The same. PROCEDURE:     Attempted Dilation and curretage. SURGEON:  Dr. Elly Modena  INDICATIONS: 58 y.o. yo [phone] with a thickened endometrium in a postmenopausal state, needing endometrial sampling.  Patient had a failed attempt in the office due to cervical stenosis. She was prescribed Cytotec prior to this procedure which she took 12 hours ago. Risks of surgery were discussed with the patient including but not limited to: bleeding which may require transfusion; infection which may require antibiotics; injury to uterus or surrounding organs;need for additional procedures including laparotomy or laparoscopy; and other postoperative/anesthesia complications. Written informed consent was obtained.    FINDINGS:  An 8-week size anteverted uterus, atrophic and stenotic os ANESTHESIA:    General anesthesia. INTRAVENOUS FLUIDS:  500 ml of LR ESTIMATED BLOOD LOSS:  Less than 10 ml. SPECIMENS:  None COMPLICATIONS:  None immediate.  PROCEDURE DETAILS:  The patient received intravenous antibiotics while in the preoperative area.  She was then taken to the operating room where general anesthesia was administered and was found to be adequate.  After an adequate timeout was performed, she was placed in the dorsal lithotomy position and examined; then prepped and draped in the sterile manner.   A vaginal speculum was then placed in the patient's vagina and a single tooth tenaculum was applied to the anterior lip of the cervix. Several attempts were taken to dilate the flush cervix starting with lacrimal duct dilators, without any success. The procedure was terminated. There was minimal bleeding noted and the tenaculum removed with good hemostasis noted.  The patient tolerated the procedure well.  The patient was taken to the recovery area in stable condition.

## 2019-06-14 NOTE — Telephone Encounter (Signed)
Courtney Hamilton the  appointment with Dr. Jeral Pinch for 06-22-19 at 1100 for follow up of thickened endometrium as Dr. Fermin Schwab has retired. Charlena Cross will inform Ms Hiraoka the appointment date and time.

## 2019-06-14 NOTE — Telephone Encounter (Addendum)
Referral to gyn/onc scheduled for April 15th @ 1030 per request of Dr. Elly Modena.  Pt notified of appt and that is located at the [facility].  I informed pt that she can bring one visitor to the appt and that it will be with Dr. Berline Lopes.  I advised pt that she can get appt information if she needs it via MyChart.  Pt verbalized understanding.  Mel Almond, RN

## 2019-06-14 NOTE — Anesthesia Procedure Notes (Signed)
Procedure Name: LMA Insertion Date/Time: 06/14/2019 8:38 AM Performed by: Willa Frater, CRNA Pre-anesthesia Checklist: Patient identified, Emergency Drugs available, Suction available and Patient being monitored Patient Re-evaluated:Patient Re-evaluated prior to induction Oxygen Delivery Method: Circle system utilized Preoxygenation: Pre-oxygenation with 100% oxygen Induction Type: IV induction Ventilation: Mask ventilation without difficulty LMA: LMA inserted LMA Size: 4.0 Number of attempts: 1 Airway Equipment and Method: Bite block Placement Confirmation: positive ETCO2 Tube secured with: Tape Dental Injury: Teeth and Oropharynx as per pre-operative assessment

## 2019-06-14 NOTE — Transfer of Care (Signed)
Immediate Anesthesia Transfer of Care Note  Patient: Courtney Hamilton  Procedure(s) Performed: EXAM UNDER ANESTHESIA (N/A Vagina )  Patient Location: PACU  Anesthesia Type:General  Level of Consciousness: awake, alert , oriented and drowsy  Airway & Oxygen Therapy: Patient Spontanous Breathing and Patient connected to face mask oxygen  Post-op Assessment: Report given to RN and Post -op Vital signs reviewed and stable  Post vital signs: Reviewed and stable  Last Vitals:  Vitals Value Taken Time  BP    Temp    Pulse 63 06/14/19 0907  Resp 14 06/14/19 0907  SpO2 100 % 06/14/19 0907  Vitals shown include unvalidated device data.  Last Pain:  Vitals:   06/14/19 0718  TempSrc: Tympanic  PainSc: 0-No pain         Complications: No apparent anesthesia complications

## 2019-06-14 NOTE — Discharge Instructions (Signed)
No Percocet or Roxicet until 4:30pm if needed. No tylenol until 1:30pm if needed.   Post Anesthesia Home Care Instructions  Activity: Get plenty of rest for the remainder of the day. A responsible individual must stay with you for 24 hours following the procedure.  For the next 24 hours, DO NOT: -Drive a car -Paediatric nurse -Drink alcoholic beverages -Take any medication unless instructed by your physician -Make any legal decisions or sign important papers.  Meals: Start with liquid foods such as gelatin or soup. Progress to regular foods as tolerated. Avoid greasy, spicy, heavy foods. If nausea and/or vomiting occur, drink only clear liquids until the nausea and/or vomiting subsides. Call your physician if vomiting continues.  Special Instructions/Symptoms: Your throat may feel dry or sore from the anesthesia or the breathing tube placed in your throat during surgery. If this causes discomfort, gargle with warm salt water. The discomfort should disappear within 24 hours.  If you had a scopolamine patch placed behind your ear for the management of post- operative nausea and/or vomiting:  1. The medication in the patch is effective for 72 hours, after which it should be removed.  Wrap patch in a tissue and discard in the trash. Wash hands thoroughly with soap and water. 2. You may remove the patch earlier than 72 hours if you experience unpleasant side effects which may include dry mouth, dizziness or visual disturbances. 3. Avoid touching the patch. Wash your hands with soap and water after contact with the patch.    No Tylenol unit 1:30pm if needed

## 2019-06-14 NOTE — Anesthesia Postprocedure Evaluation (Signed)
Anesthesia Post Note  Patient: Courtney Hamilton  Procedure(s) Performed: EXAM UNDER ANESTHESIA (N/A Vagina )     Patient location during evaluation: PACU Anesthesia Type: General Level of consciousness: awake and alert Pain management: pain level controlled Vital Signs Assessment: post-procedure vital signs reviewed and stable Respiratory status: spontaneous breathing, respiratory function stable and nonlabored ventilation Cardiovascular status: blood pressure returned to baseline and stable Postop Assessment: no apparent nausea or vomiting Anesthetic complications: no    Last Vitals:  Vitals:   06/14/19 0945 06/14/19 1018  BP: 123/82 140/88  Pulse: 62 64  Resp: (!) 9 12  Temp:  36.6 C  SpO2: 100% 100%    Last Pain:  Vitals:   06/14/19 1018  TempSrc:   PainSc: [place]

## 2019-06-22 ENCOUNTER — Other Ambulatory Visit: Payer: Self-pay

## 2019-06-22 ENCOUNTER — Encounter: Payer: Self-pay | Admitting: Gynecologic Oncology

## 2019-06-22 ENCOUNTER — Inpatient Hospital Stay: Payer: Medicare HMO | Attending: Gynecologic Oncology | Admitting: Gynecologic Oncology

## 2019-06-22 VITALS — BP 119/84 | HR 63 | Temp 98.0°F | Resp 16 | Ht 66.0 in | Wt 179.4 lb

## 2019-06-22 DIAGNOSIS — Z87891 Personal history of nicotine dependence: Secondary | ICD-10-CM | POA: Insufficient documentation

## 2019-06-22 DIAGNOSIS — F1911 Other psychoactive substance abuse, in remission: Secondary | ICD-10-CM | POA: Diagnosis not present

## 2019-06-22 DIAGNOSIS — B20 Human immunodeficiency virus [HIV] disease: Secondary | ICD-10-CM | POA: Diagnosis not present

## 2019-06-22 DIAGNOSIS — M5430 Sciatica, unspecified side: Secondary | ICD-10-CM | POA: Diagnosis not present

## 2019-06-22 DIAGNOSIS — M810 Age-related osteoporosis without current pathological fracture: Secondary | ICD-10-CM | POA: Insufficient documentation

## 2019-06-22 DIAGNOSIS — Z79899 Other long term (current) drug therapy: Secondary | ICD-10-CM | POA: Insufficient documentation

## 2019-06-22 DIAGNOSIS — R9389 Abnormal findings on diagnostic imaging of other specified body structures: Secondary | ICD-10-CM | POA: Diagnosis not present

## 2019-06-22 DIAGNOSIS — I10 Essential (primary) hypertension: Secondary | ICD-10-CM | POA: Diagnosis not present

## 2019-06-22 DIAGNOSIS — Z780 Asymptomatic menopausal state: Secondary | ICD-10-CM | POA: Diagnosis not present

## 2019-06-22 DIAGNOSIS — D071 Carcinoma in situ of vulva: Secondary | ICD-10-CM

## 2019-06-22 DIAGNOSIS — Z9079 Acquired absence of other genital organ(s): Secondary | ICD-10-CM | POA: Diagnosis not present

## 2019-06-22 NOTE — Progress Notes (Signed)
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 On 01/18/18, she underwent WLE of two right vulvar lesions; final pathology revealed VIN3. Posterior lesion had a positive lateral margin.   Presented in 04/2019 for  Work-up of thickened endometrium on CT found during a work-up for abdominal pain. Endorses some sandy colored discharge several times. Menopause was 15 years ago, denies any PMB. Given difficulty visualizing cervix in clinic, patient was scheduled for EUA and endometrial sampling int he OR. She was taken for the procedure on 4/7 with findings of an 8cm anteverted uterus, atrophy, and stenotic cervical os. Cervix unable to be dilated.   Interval History: Patient presents today to evaluate thickened endometrial lining which was found on CT of her abdomen and pelvis ordered during work-up of abdominal pain and cramping thought to be possibly GI in origin. Patient reports about a years worth of intermittent cramping. She denies any vaginal bleeding since menopause approximately 15 years ago. She has had several episodes of feeling like she was going to have discharge but has not had to wear a panty liner and has not noticed much when she wipes. She endorses normal bowel function. Her cramping seems to have improved some since she has been paying attention to her diet. She has less symptoms if she refrains from eating bread. She endorses having a good appetite with nausea first thing in the morning otherwise denies any nausea or emesis. Her hot flashes have worsened again in the past several months.  Last pap: 2019, normal  Patient denies any change in her past medical or surgical history since seeing [place]. She was treated for a vaginal bacterial infection with improved  symptoms. She denies any changes to her family history.  Past Medical/Surgical History: Past Medical History:  Diagnosis Date  . Bronchitis winter 2018  . Herniated disc 10/07/2011   lower back  . HIV (human immunodeficiency virus infection) ([place])   . Hypertension   . Lower GI bleed 05/10/2012   pt denies  . Osteoporosis   . Ruptured lumbar disc   . Sciatica   . Substance abuse ([place])    past hsitory  clean more than 20 years   . TB (pulmonary tuberculosis) 1993    exposure, treated     Past Surgical History:  Procedure Laterality Date  . APPENDECTOMY    . BREAST EXCISIONAL BIOPSY Left 2001 per pt   cyst removed   . CHOLECYSTECTOMY    . ECTOPIC PREGNANCY SURGERY    . injections to lower back  09/2017  . SALIVARY GLAND SURGERY    . VULVECTOMY N/A 01/18/2018   Procedure: PARTIAL VULVECTOMY;  Surgeon: [name], MD;  Location: [facility];  Service: Gynecology;  Laterality: N/A;    Family History  Problem Relation Age of Onset  . Asthma Father   . COPD Father   . Cancer Mother   . Diabetes Brother   . Hypertension Brother   . Diabetes Brother   . Breast cancer Sister   . Colon cancer Maternal Uncle     Social History   Socioeconomic History  . Marital status: Married    Spouse name: Not on file  . Number of children: Not on file  . Years of education: Not on file  . Highest  education level: Not on file  Occupational History  . Not on file  Tobacco Use  . Smoking status: Former Smoker    Packs/day: 0.50    Years: 30.00    Pack years: 15.00    Types: Cigarettes  . Smokeless tobacco: Never Used  . Tobacco comment: quit march 2018  Substance and Sexual Activity  . Alcohol use: No  . Drug use: No    Comment: past history of cocaine  and alcohol  none in 20 years  . Sexual activity: Yes    Partners: Male    Comment: CONDOMS GIVEN  Other Topics Concern  . Not on file  Social History Narrative  . Not on file   Social  Determinants of Health   Financial Resource Strain:   . Difficulty of Paying Living Expenses:   Food Insecurity:   . Worried About Charity fundraiser in the Last Year:   . Arboriculturist in the Last Year:   Transportation Needs:   . Film/video editor (Medical):   Marland Kitchen Lack of Transportation (Non-Medical):   Physical Activity:   . Days of Exercise per Week:   . Minutes of Exercise per Session:   Stress:   . Feeling of Stress :   Social Connections:   . Frequency of Communication with Friends and Family:   . Frequency of Social Gatherings with Friends and Family:   . Attends Religious Services:   . Active Member of Clubs or Organizations:   . Attends Archivist Meetings:   Marland Kitchen Marital Status:     Current Medications:  Current Outpatient Medications:  .  acyclovir ointment (ZOVIRAX) 5 %, Apply 1 application topically every 3 (three) hours., Disp: 30 g, Rfl: 0 .  Cyanocobalamin 1000 MCG SUBL, Place 1,000 mcg under the tongue every evening. , Disp: , Rfl:  .  DESCOVY 200-25 MG tablet, TAKE 1 TABLET BY MOUTH DAILY., Disp: 30 tablet, Rfl: 5 .  gabapentin (NEURONTIN) 300 MG capsule, TAKE 1 CAPSULE BY MOUTH THREE TIMES A DAY, Disp: 270 capsule, Rfl: 1 .  ibuprofen (ADVIL) 600 MG tablet, Take 1 tablet (600 mg total) by mouth every 6 (six) hours as needed., Disp: 30 tablet, Rfl: 1 .  ibuprofen (ADVIL) 800 MG tablet, TAKE 1 TABLET (800 MG TOTAL) BY MOUTH EVERY 8 (EIGHT) HOURS AS NEEDED FOR MODERATE PAIN., Disp: 30 tablet, Rfl: 0 .  ibuprofen (ADVIL) 800 MG tablet, Take 1 tablet (800 mg total) by mouth every 8 (eight) hours as needed., Disp: 30 tablet, Rfl: 0 .  losartan (COZAAR) 25 MG tablet, Take 1 tablet (25 mg total) by mouth daily. (Patient taking differently: Take 25 mg by mouth every evening. ), Disp: 30 tablet, Rfl: 11 .  Multiple Vitamins-Minerals (MULTIVITAMIN WITH MINERALS) tablet, Take 1 tablet by mouth daily., Disp: , Rfl:  .  oxyCODONE-acetaminophen (PERCOCET/ROXICET)  5-325 MG tablet, Take 1 tablet by mouth every 6 (six) hours as needed., Disp: 10 tablet, Rfl: 0 .  terconazole (TERAZOL 3) 0.8 % vaginal cream, Place 1 applicator vaginally at bedtime. Apply nightly for three nights., Disp: 20 g, Rfl: 0 .  TIVICAY 50 MG tablet, TAKE 1 TABLET (50 MG TOTAL) BY MOUTH DAILY., Disp: 30 tablet, Rfl: 5 .  traMADol (ULTRAM) 50 MG tablet, Take 2 tablets (100 mg total) by mouth 3 (three) times daily as needed for moderate pain., Disp: 15 tablet, Rfl: 0 .  vitamin C (ASCORBIC ACID) 500 MG tablet, Take 500 mg by  mouth daily., Disp: , Rfl:  .  zolpidem (AMBIEN) 10 MG tablet, Take 1 tablet (10 mg total) by mouth at bedtime as needed for up to 20 days for sleep., Disp: 30 tablet, Rfl: 0  Current Facility-Administered Medications:  .  acyclovir ointment (ZOVIRAX) 5 %, , Topical, Q3H, Sloan Leiter, MD  Review of Systems: Denies appetite changes, fevers, chills, fatigue, unexplained weight changes. Denies hearing loss, neck lumps or masses, mouth sores, ringing in ears or voice changes. Denies cough or wheezing.  Denies shortness of breath. Denies chest pain or palpitations. Denies leg swelling. Denies abdominal distention, pain, blood in stools, constipation, diarrhea, nausea, vomiting, or early satiety. Denies pain with intercourse, dysuria, frequency, hematuria or incontinence. Denies hot flashes, pelvic pain, vaginal bleeding or vaginal discharge.   Denies joint pain, back pain or muscle pain/cramps. Denies itching, rash, or wounds. Denies dizziness, headaches, numbness or seizures. Denies swollen lymph nodes or glands, denies easy bruising or bleeding. Denies anxiety, depression, confusion, or decreased concentration.  Physical Exam: BP 119/84 (BP Location: Left Arm, Patient Position: Sitting)   Pulse 63   Temp 98 F (36.7 C) (Temporal)   Resp 16   Ht 5\' 6"  (1.676 m)   Wt 179 lb 6 oz (81.4 kg)   SpO2 100%   BMI 28.95 kg/m  General: Alert, oriented, no acute  distress. HEENT: Normocephalic, atraumatic, sclera anicteric. Chest: Clear to auscultation bilaterally. No wheezes or rhonchi. Cardiovascular: Regular rate and rhythm, no murmurs. Abdomen: soft, nontender.  Normoactive bowel sounds.  No masses or hepatosplenomegaly appreciated.  Well-healed scar paramedian incision. Extremities: Grossly normal range of motion.  Warm, well perfused.  No edema bilaterally. Skin: No rashes or lesions noted. Lymphatics: No inguinal adenopathy. GU: Normal appearing external genitalia without erythema, excoriation, or lesions.  Speculum exam reveals mildly atrophic vaginal mucosa. Cervix is flush with the vaginal apex some mildly deviated to the left. Cervical os is difficult to appreciate and closed visually. With the patient's verbal permission, endometrial biopsy attempted. The cervix was cleansed with Betadine x3 and a single-tooth tenaculum was placed on the anterior lip of the cervix. An endometrial Pipelle was attempted to be passed and given patient's discomfort as well as clearly stenotic os, procedure was aborted. All instruments were removed from the vagina.  Laboratory & Radiologic Studies: CT A/P on 03/25/19: IMPRESSION: 1. Marked diffuse endometrial thickening, highly suspicious for endometrial carcinoma in a postmenopausal female. GYN consultation and endometrial sampling is indicated. 2. No evidence of metastatic disease or other acute findings.  Assessment & Plan: Courtney Hamilton is a 59 y.o. woman with prior history of high-grade vulvar dysplasia treated in 2019 with approximately 1 year of abdominal symptoms and CT scan several months ago showing thickened endometrial lining with unsuccessful attempts at endometrial sampling in the office as well as the OR.  Reviewed abnormal CT findings with the patient in the setting of menopause. Discussed that her cramping is very possibly due to the distention of her uterine cavity without a path to expel the  blood and/or tissue that is within the endometrium. Endometrial biopsy not feasible in clinic today. I have recommended to the patient that we proceed to the operating room for a possibly staged procedure. My plan would be to start with attempt at cervical dilation and hysteroscopy with dilation and curettage. If this is not feasible, then I would plan on diagnostic laparoscopy to perform D&C under direct visualization. If I am able to sample the endometrium then I would  plan to send the tissue for frozen pathology. If no hyperplasia or cancer found, then I would complete the procedure. If hyperplasia or malignancy encountered, then I would plan to perform robotic staging to include injection of ICG for sentinel lymph node evaluation with total laparoscopic hysterectomy with robotic assistance, bilateral salpingo-oophorectomy. If I am unable to sample the contents of the uterus, then I would proceed with robotic assisted total laparoscopic hysterectomy and BSO with plan to send uterus for intraoperative frozen section. If cancer identified and the patient has risk factors for nodal involvement, then she would necessitate a bilateral pelvic lymph node dissection. Patient was understanding of possible scenarios and wished to proceed. We have tentatively scheduled her for surgery on April 26.  The patient is a suitable candidate for staging via a minimally invasive approach to surgery.  We reviewed that robotic assistance would be used to complete the surgery.   We reviewed the sentinel lymph node technique. Risks and benefits of sentinel lymph node biopsy was reviewed. We reviewed the technique and ICG dye. The patient DOES NOT have an iodine allergy or known liver dysfunction. We reviewed the false negative rate (0.4%), and that 3% of patients with metastatic disease will not have it detected by SLN biopsy in endometrial cancer. A low risk of allergic reaction to the dye, <0.2% for ICG, has been reported. We also  discussed that in the case of failed mapping, which occurs 40% of the time, a bilateral or unilateral lymphadenectomy will be performed at the surgeon's discretion.   Potential benefits of sentinel nodes including a higher detection rate for metastasis due to ultrastaging and potential reduction in operative morbidity. However, there remains uncertainty as to the role for treatment of micrometastatic disease. Further, the benefit of operative morbidity associated with the SLN technique in endometrial cancer is not yet completely known. In other patient populations (e.g. the cervical cancer population) there has been observed reductions in morbidity with SLN biopsy compared to pelvic lymphadenectomy. Lymphedema, nerve dysfunction and lymphocysts are all potential risks with the SLN technique as with complete lymphadenectomy. Additional risks to the patient include the risk of damage to an internal organ while operating in an altered view (e.g. the black and white image of the robotic fluorescence imaging mode).   The plan will be for an EUA, possible hysteroscopy, D&C, possible diagnostic laparoscopy, possible robotic assisted hysterectomy with bilateral salpingo-oophorectomy, possible sentinel lymph node evaluation, possible lymph node dissection, possible laparotomy. The risks of surgery were discussed in detail and she understands these to include infection; wound separation; hernia; vaginal cuff separation, injury to adjacent organs such as bowel, bladder, blood vessels, ureters and nerves; bleeding which may require blood transfusion; anesthesia risk; thromboembolic events; possible death; unforeseen complications; possible need for re-exploration; medical complications such as heart attack, stroke, pleural effusion and pneumonia; and, if full lymphadenectomy is performed the risk of lymphedema and lymphocyst. The patient will receive DVT and antibiotic prophylaxis as indicated. She voiced a clear  understanding. She had the opportunity to ask questions. Perioperative instructions were reviewed with her. Prescriptions for post-op medications will be sent to her pharmacy on the day of surgery.  45 minutes of total time was spent for this patient encounter, including preparation, face-to-face counseling with the patient and coordination of care, and documentation of the encounter.  Jeral Pinch, MD  Division of Gynecologic Oncology  Department of Obstetrics and Gynecology  [facility]

## 2019-06-22 NOTE — Patient Instructions (Signed)
Preparing for your Surgery  Plan for surgery on July 03, 2019 with Dr. Jeral Pinch at [facility] will be scheduled for a dilation and curettage of the uterus with hysteroscopy with diagnostic laparoscopy, possible robotic assisted total laparoscopic hysterectomy, bilateral salpingo-oophorectomy (she would remove anything left on the other side that you had previously removed), sentinel lymph node biopsy, possible lymph node dissection, possible laparotomy.   Pre-operative Testing -You will receive a phone call from presurgical testing at [facility] to arrange for a pre-operative appointment over the phone, a lab appointment, and a COVID test.  The COVID test normally occurs three days before your procedure and they ask that your self quarantine yourself up until surgery.  -Bring your insurance card, copy of an advanced directive if applicable, medication list  -At that visit, you will be asked to sign a consent for a possible blood transfusion in case a transfusion becomes necessary during surgery.  The need for a blood transfusion is rare but having consent is a necessary part of your care.    -You should not be taking blood thinners or aspirin at least ten days prior to surgery unless instructed by your surgeon.  -Do not take supplements such as fish oil (omega 3), red yeast rice, tumeric before your surgery.   Day Before Surgery at [place] will be asked to take in a light diet the day before surgery.  Avoid carbonated beverages.  You can have clear liquids up until 3 hours before your surgery time.    Eat a light diet the day before surgery.  Examples including soups, broths, toast, yogurt, mashed potatoes.  Things to avoid include carbonated beverages (fizzy beverages), raw fruits and raw vegetables, or beans.   If your bowels are filled with gas, your surgeon will have difficulty visualizing your pelvic organs which increases your surgical risks.  Your role  in recovery Your role is to become active as soon as directed by your doctor, while still giving yourself time to heal.  Rest when you feel tired. You will be asked to do the following in order to speed your recovery:  - Cough and breathe deeply. This helps to clear and expand your lungs and can prevent pneumonia after surgery.  - [place]. Do mild physical activity. Walking or moving your legs help your circulation and body functions return to normal. Do not try to get up or walk alone the first time after surgery.   -If you develop swelling on one leg or the other, pain in the back of your leg, redness/warmth in one of your legs, please call the office or go to the Emergency Room to have a doppler to rule out a blood clot. For shortness of breath, chest pain-seek care in the Emergency Room as soon as possible. - Actively manage your pain. Managing your pain lets you move in comfort. We will ask you to rate your pain on a scale of zero to 10. It is your responsibility to tell your doctor or nurse where and how much you hurt so your pain can be treated.  Special Considerations -If you are diabetic, you may be placed on insulin after surgery to have closer control over your blood sugars to promote healing and recovery.  This does not mean that you will be discharged on insulin.  If applicable, your oral antidiabetics will be resumed when you are tolerating a solid diet.  -Your final pathology results from  surgery should be available around one week after surgery and the results will be relayed to you when available.  -FMLA forms can be faxed to [phone] and please allow 5-7 business days for completion.  -Make sure that you have Tylenol and Ibuprofen at home to use on a regular basis after surgery for pain control. We recommend alternating the medications every hour to six hours since they work differently and are processed in the body differently for pain relief.  -Review  the attached handout on narcotic use and their risks and side effects.   Bowel Regimen -You will be prescribed Sennakot-S to take nightly to prevent constipation especially if you are taking the narcotic pain medication intermittently.  It is important to prevent constipation and drink adequate amounts of liquids. You can stop taking this medication when you are not taking pain medication and you are back on your normal bowel routine.   Blood Transfusion Information (For the consent to be signed before surgery)  We will be checking your blood type before surgery so in case of emergencies, we will know what type of blood you would need.                                            WHAT IS A BLOOD TRANSFUSION?  A transfusion is the replacement of blood or some of its parts. Blood is made up of multiple cells which provide different functions.  Red blood cells carry oxygen and are used for blood loss replacement.  White blood cells fight against infection.  Platelets control bleeding.  Plasma helps clot blood.  Other blood products are available for specialized needs, such as hemophilia or other clotting disorders. BEFORE THE TRANSFUSION  Who gives blood for transfusions?   You may be able to donate blood to be used at a later date on yourself (autologous donation).  Relatives can be asked to donate blood. This is generally not any safer than if you have received blood from a stranger. The same precautions are taken to ensure safety when a relative's blood is donated.  Healthy volunteers who are fully evaluated to make sure their blood is safe. This is blood bank blood. Transfusion therapy is the safest it has ever been in the practice of medicine. Before blood is taken from a donor, a complete history is taken to make sure that person has no history of diseases nor engages in risky social behavior (examples are intravenous drug use or sexual activity with multiple partners). The donor's  travel history is screened to minimize risk of transmitting infections, such as malaria. The donated blood is tested for signs of infectious diseases, such as HIV and hepatitis. The blood is then tested to be sure it is compatible with you in order to minimize the chance of a transfusion reaction. If you or a relative donates blood, this is often done in anticipation of surgery and is not appropriate for emergency situations. It takes many days to process the donated blood. RISKS AND COMPLICATIONS Although transfusion therapy is very safe and saves many lives, the main dangers of transfusion include:   Getting an infectious disease.  Developing a transfusion reaction. This is an allergic reaction to something in the blood you were given. Every precaution is taken to prevent this. The decision to have a blood transfusion has been considered carefully by your caregiver before blood  is given. Blood is not given unless the benefits outweigh the risks.  AFTER SURGERY INSTRUCTIONS  Return to work: 4-6 weeks if applicable  Activity: 1. Be up and out of the bed during the day.  Take a nap if needed.  You may walk up steps but be careful and use the hand rail.  Stair climbing will tire you more than you think, you may need to stop part way and rest.   2. No lifting or straining for 6 weeks over 10 pounds. No pushing, pulling, straining for 6 weeks.  3. No driving for around 1 week(s) or sooner if you are not taking pain meds and feel your reaction time is back to normal.  Do not drive if you are taking narcotic pain medicine.   4. You can shower as soon as the next day after surgery. Shower daily.  Use soap and water on your incision and pat dry; don't rub.  No tub baths or submerging your body in water until cleared by your surgeon. If you have the soap that was given to you by pre-surgical testing that was used before surgery, you do not need to use it afterwards because this can irritate your  incisions.   5. No sexual activity and nothing in the vagina for 8 weeks (if you have a hysterectomy).  6. You may experience a small amount of clear drainage from your incisions, which is normal.  If the drainage persists, increases, or changes color please call the office.  7. Do not use creams, lotions, or ointments such as neosporin on your incisions after surgery until advised by your surgeon because they can cause removal of the dermabond glue on your incisions.    8. You may experience vaginal spotting after surgery or around the 6-8 week mark from surgery when the stitches at the top of the vagina begin to dissolve (if you have a hysterectomy).  The spotting is normal but if you experience heavy bleeding, call our office.  9. Take Tylenol or ibuprofen first for pain and only use narcotic pain medication for severe pain not relieved by the Tylenol or Ibuprofen.  Monitor your Tylenol intake to a max of 4,000 mg.  Diet: 1. Low sodium Heart Healthy Diet is recommended.  2. It is safe to use a laxative, such as Miralax or Colace, if you have difficulty moving your bowels. You can take Sennakot at bedtime every evening to keep bowel movements regular and to prevent constipation.    Wound Care: 1. Keep clean and dry.  Shower daily.  Reasons to call the Doctor:  Fever - Oral temperature greater than 100.4 degrees Fahrenheit  Foul-smelling vaginal discharge  Difficulty urinating  Nausea and vomiting  Increased pain at the site of the incision that is unrelieved with pain medicine.  Difficulty breathing with or without chest pain  New calf pain especially if only on one side  Sudden, continuing increased vaginal bleeding with or without clots.   Contacts: For questions or concerns you should contact:  Dr. Jeral Pinch at [phone]  Joylene John, NP at [phone]  After Hours: call [phone] and have the GYN Oncologist paged/contacted

## 2019-06-22 NOTE — H&P (View-Only) (Signed)
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 On 01/18/18, she underwent WLE of two right vulvar lesions; final pathology revealed VIN3. Posterior lesion had a positive lateral margin.   Presented in 04/2019 for  Work-up of thickened endometrium on CT found during a work-up for abdominal pain. Endorses some sandy colored discharge several times. Menopause was 15 years ago, denies any PMB. Given difficulty visualizing cervix in clinic, patient was scheduled for EUA and endometrial sampling int he OR. She was taken for the procedure on 4/7 with findings of an 8cm anteverted uterus, atrophy, and stenotic cervical os. Cervix unable to be dilated.   Interval History: Patient presents today to evaluate thickened endometrial lining which was found on CT of her abdomen and pelvis ordered during work-up of abdominal pain and cramping thought to be possibly GI in origin. Patient reports about a years worth of intermittent cramping. She denies any vaginal bleeding since menopause approximately 15 years ago. She has had several episodes of feeling like she was going to have discharge but has not had to wear a panty liner and has not noticed much when she wipes. She endorses normal bowel function. Her cramping seems to have improved some since she has been paying attention to her diet. She has less symptoms if she refrains from eating bread. She endorses having a good appetite with nausea first thing in the morning otherwise denies any nausea or emesis. Her hot flashes have worsened again in the past several months.  Last pap: 2019, normal  Patient denies any change in her past medical or surgical history since seeing [place]. She was treated for a vaginal bacterial infection with improved  symptoms. She denies any changes to her family history.  Past Medical/Surgical History: Past Medical History:  Diagnosis Date  . Bronchitis winter 2018  . Herniated disc 10/07/2011   lower back  . HIV (human immunodeficiency virus infection) ([place])   . Hypertension   . Lower GI bleed 05/10/2012   pt denies  . Osteoporosis   . Ruptured lumbar disc   . Sciatica   . 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 TB (pulmonary tuberculosis) 1993    exposure, treated     Past Surgical History:  Procedure Laterality Date  . APPENDECTOMY    . BREAST EXCISIONAL BIOPSY Left 2001 per pt   cyst removed   . CHOLECYSTECTOMY    . ECTOPIC PREGNANCY SURGERY    . injections to lower back  09/2017  . SALIVARY GLAND SURGERY    . VULVECTOMY N/A 01/18/2018   Procedure: PARTIAL VULVECTOMY;  Surgeon: [name], MD;  Location: [facility];  Service: Gynecology;  Laterality: N/A;    Family History  Problem Relation Age of Onset  . Asthma Father   . COPD Father   . Cancer Mother   . Diabetes Brother   . Hypertension Brother   . Diabetes Brother   . Breast cancer Sister   . Colon cancer Maternal Uncle     Social History   Socioeconomic History  . Marital status: Married    Spouse name: Not on file  . Number of children: Not on file  . Years of education: Not on file  . Highest  education level: Not on file  Occupational History  . Not on file  Tobacco Use  . Smoking status: Former Smoker    Packs/day: 0.50    Years: 30.00    Pack years: 15.00    Types: Cigarettes  . Smokeless tobacco: Never Used  . Tobacco comment: quit march 2018  Substance and Sexual Activity  . Alcohol use: No  . Drug use: No    Comment: past history of cocaine  and alcohol  none in 20 years  . Sexual activity: Yes    Partners: Male    Comment: CONDOMS GIVEN  Other Topics Concern  . Not on file  Social History Narrative  . Not on file   Social  Determinants of Health   Financial Resource Strain:   . Difficulty of Paying Living Expenses:   Food Insecurity:   . Worried About Charity fundraiser in the Last Year:   . Arboriculturist in the Last Year:   Transportation Needs:   . Film/video editor (Medical):   Marland Kitchen Lack of Transportation (Non-Medical):   Physical Activity:   . Days of Exercise per Week:   . Minutes of Exercise per Session:   Stress:   . Feeling of Stress :   Social Connections:   . Frequency of Communication with Friends and Family:   . Frequency of Social Gatherings with Friends and Family:   . Attends Religious Services:   . Active Member of Clubs or Organizations:   . Attends Archivist Meetings:   Marland Kitchen Marital Status:     Current Medications:  Current Outpatient Medications:  .  acyclovir ointment (ZOVIRAX) 5 %, Apply 1 application topically every 3 (three) hours., Disp: 30 g, Rfl: 0 .  Cyanocobalamin 1000 MCG SUBL, Place 1,000 mcg under the tongue every evening. , Disp: , Rfl:  .  DESCOVY 200-25 MG tablet, TAKE 1 TABLET BY MOUTH DAILY., Disp: 30 tablet, Rfl: 5 .  gabapentin (NEURONTIN) 300 MG capsule, TAKE 1 CAPSULE BY MOUTH THREE TIMES A DAY, Disp: 270 capsule, Rfl: 1 .  ibuprofen (ADVIL) 600 MG tablet, Take 1 tablet (600 mg total) by mouth every 6 (six) hours as needed., Disp: 30 tablet, Rfl: 1 .  ibuprofen (ADVIL) 800 MG tablet, TAKE 1 TABLET (800 MG TOTAL) BY MOUTH EVERY 8 (EIGHT) HOURS AS NEEDED FOR MODERATE PAIN., Disp: 30 tablet, Rfl: 0 .  ibuprofen (ADVIL) 800 MG tablet, Take 1 tablet (800 mg total) by mouth every 8 (eight) hours as needed., Disp: 30 tablet, Rfl: 0 .  losartan (COZAAR) 25 MG tablet, Take 1 tablet (25 mg total) by mouth daily. (Patient taking differently: Take 25 mg by mouth every evening. ), Disp: 30 tablet, Rfl: 11 .  Multiple Vitamins-Minerals (MULTIVITAMIN WITH MINERALS) tablet, Take 1 tablet by mouth daily., Disp: , Rfl:  .  oxyCODONE-acetaminophen (PERCOCET/ROXICET)  5-325 MG tablet, Take 1 tablet by mouth every 6 (six) hours as needed., Disp: 10 tablet, Rfl: 0 .  terconazole (TERAZOL 3) 0.8 % vaginal cream, Place 1 applicator vaginally at bedtime. Apply nightly for three nights., Disp: 20 g, Rfl: 0 .  TIVICAY 50 MG tablet, TAKE 1 TABLET (50 MG TOTAL) BY MOUTH DAILY., Disp: 30 tablet, Rfl: 5 .  traMADol (ULTRAM) 50 MG tablet, Take 2 tablets (100 mg total) by mouth 3 (three) times daily as needed for moderate pain., Disp: 15 tablet, Rfl: 0 .  vitamin C (ASCORBIC ACID) 500 MG tablet, Take 500 mg by  mouth daily., Disp: , Rfl:  .  zolpidem (AMBIEN) 10 MG tablet, Take 1 tablet (10 mg total) by mouth at bedtime as needed for up to 20 days for sleep., Disp: 30 tablet, Rfl: 0  Current Facility-Administered Medications:  .  acyclovir ointment (ZOVIRAX) 5 %, , Topical, Q3H, Sloan Leiter, MD  Review of Systems: Denies appetite changes, fevers, chills, fatigue, unexplained weight changes. Denies hearing loss, neck lumps or masses, mouth sores, ringing in ears or voice changes. Denies cough or wheezing.  Denies shortness of breath. Denies chest pain or palpitations. Denies leg swelling. Denies abdominal distention, pain, blood in stools, constipation, diarrhea, nausea, vomiting, or early satiety. Denies pain with intercourse, dysuria, frequency, hematuria or incontinence. Denies hot flashes, pelvic pain, vaginal bleeding or vaginal discharge.   Denies joint pain, back pain or muscle pain/cramps. Denies itching, rash, or wounds. Denies dizziness, headaches, numbness or seizures. Denies swollen lymph nodes or glands, denies easy bruising or bleeding. Denies anxiety, depression, confusion, or decreased concentration.  Physical Exam: BP 119/84 (BP Location: Left Arm, Patient Position: Sitting)   Pulse 63   Temp 98 F (36.7 C) (Temporal)   Resp 16   Ht 5\' 6"  (1.676 m)   Wt 179 lb 6 oz (81.4 kg)   SpO2 100%   BMI 28.95 kg/m  General: Alert, oriented, no acute  distress. HEENT: Normocephalic, atraumatic, sclera anicteric. Chest: Clear to auscultation bilaterally. No wheezes or rhonchi. Cardiovascular: Regular rate and rhythm, no murmurs. Abdomen: soft, nontender.  Normoactive bowel sounds.  No masses or hepatosplenomegaly appreciated.  Well-healed scar paramedian incision. Extremities: Grossly normal range of motion.  Warm, well perfused.  No edema bilaterally. Skin: No rashes or lesions noted. Lymphatics: No inguinal adenopathy. GU: Normal appearing external genitalia without erythema, excoriation, or lesions.  Speculum exam reveals mildly atrophic vaginal mucosa. Cervix is flush with the vaginal apex some mildly deviated to the left. Cervical os is difficult to appreciate and closed visually. With the patient's verbal permission, endometrial biopsy attempted. The cervix was cleansed with Betadine x3 and a single-tooth tenaculum was placed on the anterior lip of the cervix. An endometrial Pipelle was attempted to be passed and given patient's discomfort as well as clearly stenotic os, procedure was aborted. All instruments were removed from the vagina.  Laboratory & Radiologic Studies: CT A/P on 03/25/19: IMPRESSION: 1. Marked diffuse endometrial thickening, highly suspicious for endometrial carcinoma in a postmenopausal female. GYN consultation and endometrial sampling is indicated. 2. No evidence of metastatic disease or other acute findings.  Assessment & Plan: Courtney Hamilton is a 59 y.o. woman with prior history of high-grade vulvar dysplasia treated in 2019 with approximately 1 year of abdominal symptoms and CT scan several months ago showing thickened endometrial lining with unsuccessful attempts at endometrial sampling in the office as well as the OR.  Reviewed abnormal CT findings with the patient in the setting of menopause. Discussed that her cramping is very possibly due to the distention of her uterine cavity without a path to expel the  blood and/or tissue that is within the endometrium. Endometrial biopsy not feasible in clinic today. I have recommended to the patient that we proceed to the operating room for a possibly staged procedure. My plan would be to start with attempt at cervical dilation and hysteroscopy with dilation and curettage. If this is not feasible, then I would plan on diagnostic laparoscopy to perform D&C under direct visualization. If I am able to sample the endometrium then I would  plan to send the tissue for frozen pathology. If no hyperplasia or cancer found, then I would complete the procedure. If hyperplasia or malignancy encountered, then I would plan to perform robotic staging to include injection of ICG for sentinel lymph node evaluation with total laparoscopic hysterectomy with robotic assistance, bilateral salpingo-oophorectomy. If I am unable to sample the contents of the uterus, then I would proceed with robotic assisted total laparoscopic hysterectomy and BSO with plan to send uterus for intraoperative frozen section. If cancer identified and the patient has risk factors for nodal involvement, then she would necessitate a bilateral pelvic lymph node dissection. Patient was understanding of possible scenarios and wished to proceed. We have tentatively scheduled her for surgery on April 26.  The patient is a suitable candidate for staging via a minimally invasive approach to surgery.  We reviewed that robotic assistance would be used to complete the surgery.   We reviewed the sentinel lymph node technique. Risks and benefits of sentinel lymph node biopsy was reviewed. We reviewed the technique and ICG dye. The patient DOES NOT have an iodine allergy or known liver dysfunction. We reviewed the false negative rate (0.4%), and that 3% of patients with metastatic disease will not have it detected by SLN biopsy in endometrial cancer. A low risk of allergic reaction to the dye, <0.2% for ICG, has been reported. We also  discussed that in the case of failed mapping, which occurs 40% of the time, a bilateral or unilateral lymphadenectomy will be performed at the surgeon's discretion.   Potential benefits of sentinel nodes including a higher detection rate for metastasis due to ultrastaging and potential reduction in operative morbidity. However, there remains uncertainty as to the role for treatment of micrometastatic disease. Further, the benefit of operative morbidity associated with the SLN technique in endometrial cancer is not yet completely known. In other patient populations (e.g. the cervical cancer population) there has been observed reductions in morbidity with SLN biopsy compared to pelvic lymphadenectomy. Lymphedema, nerve dysfunction and lymphocysts are all potential risks with the SLN technique as with complete lymphadenectomy. Additional risks to the patient include the risk of damage to an internal organ while operating in an altered view (e.g. the black and white image of the robotic fluorescence imaging mode).   The plan will be for an EUA, possible hysteroscopy, D&C, possible diagnostic laparoscopy, possible robotic assisted hysterectomy with bilateral salpingo-oophorectomy, possible sentinel lymph node evaluation, possible lymph node dissection, possible laparotomy. The risks of surgery were discussed in detail and she understands these to include infection; wound separation; hernia; vaginal cuff separation, injury to adjacent organs such as bowel, bladder, blood vessels, ureters and nerves; bleeding which may require blood transfusion; anesthesia risk; thromboembolic events; possible death; unforeseen complications; possible need for re-exploration; medical complications such as heart attack, stroke, pleural effusion and pneumonia; and, if full lymphadenectomy is performed the risk of lymphedema and lymphocyst. The patient will receive DVT and antibiotic prophylaxis as indicated. She voiced a clear  understanding. She had the opportunity to ask questions. Perioperative instructions were reviewed with her. Prescriptions for post-op medications will be sent to her pharmacy on the day of surgery.  45 minutes of total time was spent for this patient encounter, including preparation, face-to-face counseling with the patient and coordination of care, and documentation of the encounter.  Jeral Pinch, MD  Division of Gynecologic Oncology  Department of Obstetrics and Gynecology  [facility]

## 2019-06-23 ENCOUNTER — Other Ambulatory Visit: Payer: Self-pay | Admitting: Gynecologic Oncology

## 2019-06-23 DIAGNOSIS — R9389 Abnormal findings on diagnostic imaging of other specified body structures: Secondary | ICD-10-CM

## 2019-06-26 ENCOUNTER — Encounter (HOSPITAL_COMMUNITY): Payer: Self-pay

## 2019-06-26 NOTE — Patient Instructions (Addendum)
DUE TO COVID-19 ONLY TWO VISITORS ARE ALLOWED TO COME WITH YOU AND STAY IN THE WAITING ROOM ONLY DURING PRE OP AND PROCEDURE. THE TWO VISITORS MAY VISIT WITH YOU IN YOUR PRIVATE ROOM DURING VISITING HOURS ONLY!!   COVID SWAB TESTING MUST BE COMPLETED ON:  Thursday, June 29, 2019 at 10:55 AM      [address], [place] -[facility] enter pre surgical testing line (Must self quarantine after testing. Follow instructions on handout.)             Your procedure is scheduled on: Monday, July 03, 2019   Report to [facility] Main  Entrance    Report to admitting at 10:45 AM   Call this number if you have problems the morning of surgery [phone]   Do not eat food :After Midnight.   May have liquids until 9:45 AM day of surgery   CLEAR LIQUID DIET  Foods Allowed                                                                     Foods Excluded  Water, Black Coffee and tea, regular and decaf                             liquids that you cannot  Plain Jell-O in any flavor  (No red)                                           see through such as: Fruit ices (not with fruit pulp)                                     milk, soups, orange juice  Iced Popsicles (No red)                                    All solid food Carbonated beverages, regular and diet                                    Apple juices Sports drinks like Gatorade (No red) Lightly seasoned clear broth or consume(fat free) Sugar, honey syrup  Sample Menu Breakfast                                Lunch                                     Supper Cranberry juice                    Beef broth  Chicken broth Jell-O                                     Grape juice                           Apple juice Coffee or tea                        Jell-O                                      Popsicle                                                Coffee or tea                         Coffee or tea   Oral Hygiene is also important to reduce your risk of infection.                                    Remember - BRUSH YOUR TEETH THE MORNING OF SURGERY WITH YOUR REGULAR TOOTHPASTE   Do NOT smoke after Midnight   Take these medicines the morning of surgery with A SIP OF WATER: Descovy, Tivicay              You may not have any metal on your body including hair pins, jewelry, and body piercings             Do not wear make-up, lotions, powders, perfumes/cologne, or deodorant             Do not wear nail polish.  Do not shave  48 hours prior to surgery.              Do not bring valuables to the hospital. [place].   Contacts, dentures or bridgework may not be worn into surgery.   Bring small overnight bag day of surgery.    Patients discharged the day of surgery will not be allowed to drive home.   Special Instructions: Bring a copy of your healthcare power of attorney and living will documents         the day of surgery if you haven't scanned them in before.              Please read over the following fact sheets you were given: IF YOU HAVE QUESTIONS ABOUT YOUR PRE OP INSTRUCTIONS PLEASE CALL [phone]   [place] - Preparing for Surgery Before surgery, you can play an important role.  Because skin is not sterile, your skin needs to be as free of germs as possible.  You can reduce the number of germs on your skin by washing with CHG (chlorahexidine gluconate) soap before surgery.  CHG is an antiseptic cleaner which kills germs and bonds with the skin to continue killing germs even after washing. Please DO NOT use if  you have an allergy to CHG or antibacterial soaps.  If your skin becomes reddened/irritated stop using the CHG and inform your nurse when you arrive at Short Stay. Do not shave (including legs and underarms) for at least 48 hours prior to the first CHG shower.  You may shave your face/neck.  Please  follow these instructions carefully:  1.  Shower with CHG Soap the night before surgery and the  morning of surgery.  2.  If you choose to wash your hair, wash your hair first as usual with your normal  shampoo.  3.  After you shampoo, rinse your hair and body thoroughly to remove the shampoo.                             4.  Use CHG as you would any other liquid soap.  You can apply chg directly to the skin and wash.  Gently with a scrungie or clean washcloth.  5.  Apply the CHG Soap to your body ONLY FROM THE NECK DOWN.   Do   not use on face/ open                           Wound or open sores. Avoid contact with eyes, ears mouth and   genitals (private parts).                       Wash face,  Genitals (private parts) with your normal soap.             6.  Wash thoroughly, paying special attention to the area where your    surgery  will be performed.  7.  Thoroughly rinse your body with warm water from the neck down.  8.  DO NOT shower/wash with your normal soap after using and rinsing off the CHG Soap.                9.  Pat yourself dry with a clean towel.            10.  Wear clean pajamas.            11.  Place clean sheets on your bed the night of your first shower and do not  sleep with pets. Day of Surgery : Do not apply any lotions/deodorants the morning of surgery.  Please wear clean clothes to the hospital/surgery center.  FAILURE TO FOLLOW THESE INSTRUCTIONS MAY RESULT IN THE CANCELLATION OF YOUR SURGERY  PATIENT SIGNATURE_________________________________  NURSE SIGNATURE__________________________________  ________________________________________________________________________   Courtney Hamilton  An incentive spirometer is a tool that can help keep your lungs clear and active. This tool measures how well you are filling your lungs with each breath. Taking long deep breaths may help reverse or decrease the chance of developing breathing (pulmonary) problems (especially  infection) following:  A long period of time when you are unable to move or be active. BEFORE THE PROCEDURE   If the spirometer includes an indicator to show your best effort, your nurse or respiratory therapist will set it to a desired goal.  If possible, sit up straight or lean slightly forward. Try not to slouch.  Hold the incentive spirometer in an upright position. INSTRUCTIONS FOR USE  1. Sit on the edge of your bed if possible, or sit up as far as you can in bed or on a chair. 2. Hold  the incentive spirometer in an upright position. 3. Breathe out normally. 4. Place the mouthpiece in your mouth and seal your lips tightly around it. 5. Breathe in slowly and as deeply as possible, raising the piston or the ball toward the top of the column. 6. Hold your breath for 3-5 seconds or for as long as possible. Allow the piston or ball to fall to the bottom of the column. 7. Remove the mouthpiece from your mouth and breathe out normally. 8. Rest for a few seconds and repeat Steps 1 through 7 at least 10 times every 1-2 hours when you are awake. Take your time and take a few normal breaths between deep breaths. 9. The spirometer may include an indicator to show your best effort. Use the indicator as a goal to work toward during each repetition. 10. After each set of 10 deep breaths, practice coughing to be sure your lungs are clear. If you have an incision (the cut made at the time of surgery), support your incision when coughing by placing a pillow or rolled up towels firmly against it. Once you are able to get out of bed, walk around indoors and cough well. You may stop using the incentive spirometer when instructed by your caregiver.  RISKS AND COMPLICATIONS  Take your time so you do not get dizzy or light-headed.  If you are in pain, you may need to take or ask for pain medication before doing incentive spirometry. It is harder to take a deep breath if you are having pain. AFTER  USE  Rest and breathe slowly and easily.  It can be helpful to keep track of a log of your progress. Your caregiver can provide you with a simple table to help with this. If you are using the spirometer at home, follow these instructions: [place] IF:   You are having difficultly using the spirometer.  You have trouble using the spirometer as often as instructed.  Your pain medication is not giving enough relief while using the spirometer.  You develop fever of 100.5 F (38.1 C) or higher. SEEK IMMEDIATE MEDICAL CARE IF:   You cough up bloody sputum that had not been present before.  You develop fever of 102 F (38.9 C) or greater.  You develop worsening pain at or near the incision site. MAKE SURE YOU:   Understand these instructions.  Will watch your condition.  Will get help right away if you are not doing well or get worse. Document Released: 07/06/2006 Document Revised: 05/18/2011 Document Reviewed: 09/06/2006 ExitCare Patient Information 2014 Exit[employer], [place].   ________________________________________________________________________  WHAT IS A BLOOD TRANSFUSION? Blood Transfusion Information  A transfusion is the replacement of blood or some of its parts. Blood is made up of multiple cells which provide different functions.  Red blood cells carry oxygen and are used for blood loss replacement.  White blood cells fight against infection.  Platelets control bleeding.  Plasma helps clot blood.  Other blood products are available for specialized needs, such as hemophilia or other clotting disorders. BEFORE THE TRANSFUSION  Who gives blood for transfusions?   Healthy volunteers who are fully evaluated to make sure their blood is safe. This is blood bank blood. Transfusion therapy is the safest it has ever been in the practice of medicine. Before blood is taken from a donor, a complete history is taken to make sure that person has no history of diseases  nor engages in risky social behavior (examples are intravenous drug  use or sexual activity with multiple partners). The donor's travel history is screened to minimize risk of transmitting infections, such as malaria. The donated blood is tested for signs of infectious diseases, such as HIV and hepatitis. The blood is then tested to be sure it is compatible with you in order to minimize the chance of a transfusion reaction. If you or a relative donates blood, this is often done in anticipation of surgery and is not appropriate for emergency situations. It takes many days to process the donated blood. RISKS AND COMPLICATIONS Although transfusion therapy is very safe and saves many lives, the main dangers of transfusion include:   Getting an infectious disease.  Developing a transfusion reaction. This is an allergic reaction to something in the blood you were given. Every precaution is taken to prevent this. The decision to have a blood transfusion has been considered carefully by your caregiver before blood is given. Blood is not given unless the benefits outweigh the risks. AFTER THE TRANSFUSION  Right after receiving a blood transfusion, you will usually feel much better and more energetic. This is especially true if your red blood cells have gotten low (anemic). The transfusion raises the level of the red blood cells which carry oxygen, and this usually causes an energy increase.  The nurse administering the transfusion will monitor you carefully for complications. HOME CARE INSTRUCTIONS  No special instructions are needed after a transfusion. You may find your energy is better. Speak with your caregiver about any limitations on activity for underlying diseases you may have. SEEK MEDICAL CARE IF:   Your condition is not improving after your transfusion.  You develop redness or irritation at the intravenous (IV) site. SEEK IMMEDIATE MEDICAL CARE IF:  Any of the following symptoms occur over the  next 12 hours:  Shaking chills.  You have a temperature by mouth above 102 F (38.9 C), not controlled by medicine.  Chest, back, or muscle pain.  People around you feel you are not acting correctly or are confused.  Shortness of breath or difficulty breathing.  Dizziness and fainting.  You get a rash or develop hives.  You have a decrease in urine output.  Your urine turns a dark color or changes to pink, red, or brown. Any of the following symptoms occur over the next 10 days:  You have a temperature by mouth above 102 F (38.9 C), not controlled by medicine.  Shortness of breath.  Weakness after normal activity.  The white part of the eye turns yellow (jaundice).  You have a decrease in the amount of urine or are urinating less often.  Your urine turns a dark color or changes to pink, red, or brown. Document Released: 02/21/2000 Document Revised: 05/18/2011 Document Reviewed: 10/10/2007 [facility] Patient Information 2014 [place], [place].  _______________________________________________________________________

## 2019-06-27 MED FILL — DESCOVY 200-25 MG TABS: 200-25 | 30 days supply | Qty: 30 | Fill #3

## 2019-06-27 MED FILL — TIVICAY 50 MG TABLET: 50 | 30 days supply | Qty: 30 | Fill #3

## 2019-06-28 ENCOUNTER — Encounter (HOSPITAL_COMMUNITY): Payer: Self-pay

## 2019-06-28 ENCOUNTER — Encounter (HOSPITAL_COMMUNITY)
Admission: RE | Admit: 2019-06-28 | Discharge: 2019-06-28 | Disposition: A | Discharge status: Home / Self Care | Payer: Medicare HMO | Source: Ambulatory Visit | Attending: Gynecologic Oncology | Admitting: Gynecologic Oncology

## 2019-06-28 ENCOUNTER — Other Ambulatory Visit: Payer: Self-pay

## 2019-06-28 DIAGNOSIS — Z01812 Encounter for preprocedural laboratory examination: Secondary | ICD-10-CM | POA: Insufficient documentation

## 2019-06-28 HISTORY — DX: Carcinoma in situ of vulva: D07.1

## 2019-06-28 HISTORY — DX: Inflammatory liver disease, unspecified: K75.9

## 2019-06-28 HISTORY — DX: Polyneuropathy, unspecified: G62.9

## 2019-06-28 HISTORY — DX: Unspecified osteoarthritis, unspecified site: M19.90

## 2019-06-28 NOTE — Progress Notes (Signed)
COVID 19 vaccine series is completed  PCP - M. Shelda Altes. Cardiologist - N/A  Chest x-ray - N/A EKG - 06/07/19 in epic Stress Test - N/A ECHO - N/A Cardiac Cath - N/A  Sleep Study - N/A CPAP - N/A  Fasting Blood Sugar - N/A Checks Blood Sugar __N/A___ times a day  Blood Thinner Instructions: N/A Aspirin Instructions: N/A Last Dose: N/A  Anesthesia review:  N/A  Patient denies shortness of breath, fever, cough and chest pain at PAT appointment   Patient verbalized understanding of instructions that were given to them at the PAT appointment. Patient was also instructed that they will need to review over the PAT instructions again at home before surgery.

## 2019-06-29 ENCOUNTER — Telehealth: Payer: Medicare HMO | Admitting: Obstetrics and Gynecology

## 2019-06-29 ENCOUNTER — Encounter (HOSPITAL_COMMUNITY)
Admission: RE | Admit: 2019-06-29 | Discharge: 2019-06-29 | Disposition: A | Discharge status: Home / Self Care | Payer: Medicare HMO | Source: Ambulatory Visit | Attending: Gynecologic Oncology | Admitting: Gynecologic Oncology

## 2019-06-29 ENCOUNTER — Other Ambulatory Visit (HOSPITAL_COMMUNITY)
Admission: RE | Admit: 2019-06-29 | Discharge: 2019-06-29 | Disposition: A | Discharge status: Home / Self Care | Payer: Medicare HMO | Source: Ambulatory Visit | Attending: Gynecologic Oncology | Admitting: Gynecologic Oncology

## 2019-06-29 DIAGNOSIS — Z21 Asymptomatic human immunodeficiency virus [HIV] infection status: Secondary | ICD-10-CM | POA: Insufficient documentation

## 2019-06-29 DIAGNOSIS — Z01812 Encounter for preprocedural laboratory examination: Secondary | ICD-10-CM | POA: Diagnosis present

## 2019-06-29 DIAGNOSIS — Z87891 Personal history of nicotine dependence: Secondary | ICD-10-CM | POA: Insufficient documentation

## 2019-06-29 DIAGNOSIS — Z20822 Contact with and (suspected) exposure to covid-19: Secondary | ICD-10-CM | POA: Diagnosis not present

## 2019-06-29 DIAGNOSIS — Z79899 Other long term (current) drug therapy: Secondary | ICD-10-CM | POA: Insufficient documentation

## 2019-06-29 DIAGNOSIS — I10 Essential (primary) hypertension: Secondary | ICD-10-CM | POA: Diagnosis not present

## 2019-06-29 LAB — COMPREHENSIVE METABOLIC PANEL
ALT: 12 U/L (ref 0–44)
AST: 19 U/L (ref 15–41)
Albumin: 4 g/dL (ref 3.5–5.0)
Alkaline Phosphatase: 58 U/L (ref 38–126)
Anion gap: 8 (ref 5–15)
BUN: 14 mg/dL (ref 6–20)
CO2: 25 mmol/L (ref 22–32)
Calcium: 9.2 mg/dL (ref 8.9–10.3)
Chloride: 107 mmol/L (ref 98–111)
Creatinine, Ser: 1.04 mg/dL — ABNORMAL HIGH (ref 0.44–1.00)
GFR calc Af Amer: >60 mL/min (ref >60)
GFR calc non Af Amer: 58 mL/min — ABNORMAL LOW (ref >60)
Glucose, Bld: 95 mg/dL (ref 70–99)
Potassium: 3.9 mmol/L (ref 3.5–5.1)
Sodium: 140 mmol/L (ref 135–145)
Total Bilirubin: 0.5 mg/dL (ref 0.3–1.2)
Total Protein: 7.7 g/dL (ref 6.5–8.1)

## 2019-06-29 LAB — CBC
HCT: 44.3 % (ref 36.0–46.0)
Hemoglobin: 13.9 g/dL (ref 12.0–15.0)
MCH: 28.6 pg (ref 26.0–34.0)
MCHC: 31.4 g/dL (ref 30.0–36.0)
MCV: 91.2 fL (ref 80.0–100.0)
Platelets: 246 10*3/uL (ref 150–400)
RBC: 4.86 MIL/uL (ref 3.87–5.11)
RDW: 13.2 % (ref 11.5–15.5)
WBC: 7.1 10*3/uL (ref 4.0–10.5)
nRBC: 0 % (ref 0.0–0.2)

## 2019-06-29 LAB — URINALYSIS, ROUTINE W REFLEX MICROSCOPIC
Bilirubin Urine: NEGATIVE
Glucose, UA: NEGATIVE mg/dL
Ketones, ur: NEGATIVE mg/dL
Nitrite: NEGATIVE
Protein, ur: NEGATIVE mg/dL
Specific Gravity, Urine: 1.014 (ref 1.005–1.030)
pH: 6 (ref 5.0–8.0)

## 2019-06-29 LAB — SARS CORONAVIRUS 2 (TAT 6-24 HRS): SARS Coronavirus 2: NEGATIVE

## 2019-06-29 LAB — ABO/RH: ABO/RH(D): B POS

## 2019-06-30 ENCOUNTER — Telehealth: Payer: Self-pay

## 2019-06-30 NOTE — Telephone Encounter (Signed)
Courtney Hamilton states that she understands the written instructions given to her to follow for pre op. She does not have any questions at this time.

## 2019-07-01 LAB — URINE CULTURE

## 2019-07-03 ENCOUNTER — Ambulatory Visit (HOSPITAL_COMMUNITY): Payer: Medicare HMO | Admitting: Anesthesiology

## 2019-07-03 ENCOUNTER — Ambulatory Visit (HOSPITAL_COMMUNITY)
Admission: RE | Admit: 2019-07-03 | Discharge: 2019-07-03 | Disposition: A | Discharge status: Home / Self Care | Payer: Medicare HMO | Attending: Gynecologic Oncology | Admitting: Gynecologic Oncology

## 2019-07-03 ENCOUNTER — Other Ambulatory Visit: Payer: Self-pay

## 2019-07-03 ENCOUNTER — Encounter (HOSPITAL_COMMUNITY): Payer: Self-pay | Admitting: Gynecologic Oncology

## 2019-07-03 ENCOUNTER — Encounter (HOSPITAL_COMMUNITY)
Admission: RE | Disposition: A | Discharge status: Home / Self Care | Payer: Self-pay | Source: Home / Self Care | Attending: Gynecologic Oncology

## 2019-07-03 DIAGNOSIS — D060 Carcinoma in situ of endocervix: Secondary | ICD-10-CM | POA: Diagnosis not present

## 2019-07-03 DIAGNOSIS — Z780 Asymptomatic menopausal state: Secondary | ICD-10-CM | POA: Insufficient documentation

## 2019-07-03 DIAGNOSIS — M810 Age-related osteoporosis without current pathological fracture: Secondary | ICD-10-CM | POA: Insufficient documentation

## 2019-07-03 DIAGNOSIS — I10 Essential (primary) hypertension: Secondary | ICD-10-CM | POA: Insufficient documentation

## 2019-07-03 DIAGNOSIS — Z87891 Personal history of nicotine dependence: Secondary | ICD-10-CM | POA: Insufficient documentation

## 2019-07-03 DIAGNOSIS — R9389 Abnormal findings on diagnostic imaging of other specified body structures: Secondary | ICD-10-CM | POA: Diagnosis present

## 2019-07-03 DIAGNOSIS — D069 Carcinoma in situ of cervix, unspecified: Secondary | ICD-10-CM | POA: Diagnosis not present

## 2019-07-03 DIAGNOSIS — Z79899 Other long term (current) drug therapy: Secondary | ICD-10-CM | POA: Insufficient documentation

## 2019-07-03 DIAGNOSIS — N8500 Endometrial hyperplasia, unspecified: Secondary | ICD-10-CM | POA: Insufficient documentation

## 2019-07-03 DIAGNOSIS — N882 Stricture and stenosis of cervix uteri: Secondary | ICD-10-CM | POA: Diagnosis not present

## 2019-07-03 DIAGNOSIS — B20 Human immunodeficiency virus [HIV] disease: Secondary | ICD-10-CM | POA: Insufficient documentation

## 2019-07-03 HISTORY — PX: ROBOTIC ASSISTED TOTAL HYSTERECTOMY WITH BILATERAL SALPINGO OOPHERECTOMY: SHX6086

## 2019-07-03 HISTORY — PX: LAPAROSCOPY: SHX197

## 2019-07-03 LAB — TYPE AND SCREEN
ABO/RH(D): B POS
Antibody Screen: NEGATIVE

## 2019-07-03 SURGERY — LAPAROSCOPY, DIAGNOSTIC
Anesthesia: General

## 2019-07-03 MED ORDER — KETAMINE HCL 10 MG/ML IJ SOLN
INTRAMUSCULAR | Status: DC | PRN
  Administered 2019-07-03: 30 mg via INTRAVENOUS

## 2019-07-03 MED ORDER — HYDROMORPHONE HCL 1 MG/ML IJ SOLN
INTRAMUSCULAR | Status: AC
  Filled 2019-07-03: qty 1

## 2019-07-03 MED ORDER — FENTANYL CITRATE (PF) 250 MCG/5ML IJ SOLN
INTRAMUSCULAR | Status: AC
  Filled 2019-07-03: qty 5

## 2019-07-03 MED ORDER — MIDAZOLAM HCL 2 MG/2ML IJ SOLN
INTRAMUSCULAR | Status: DC | PRN
  Administered 2019-07-03: 2 mg via INTRAVENOUS

## 2019-07-03 MED ORDER — KETAMINE HCL 10 MG/ML IJ SOLN
INTRAMUSCULAR | Status: AC
  Filled 2019-07-03: qty 1

## 2019-07-03 MED ORDER — ONDANSETRON HCL 4 MG/2ML IJ SOLN
INTRAMUSCULAR | Status: DC | PRN
  Administered 2019-07-03: 4 mg via INTRAVENOUS

## 2019-07-03 MED ORDER — OXYCODONE HCL 5 MG PO TABS
ORAL_TABLET | ORAL | Status: AC
  Filled 2019-07-03: qty 1

## 2019-07-03 MED ORDER — HYDRALAZINE HCL 20 MG/ML IJ SOLN
INTRAMUSCULAR | Status: AC
  Filled 2019-07-03: qty 1

## 2019-07-03 MED ORDER — PROMETHAZINE HCL 25 MG/ML IJ SOLN: 6.2500 mg | INTRAMUSCULAR | Status: DC | PRN

## 2019-07-03 MED ORDER — LIDOCAINE HCL 2 % IJ SOLN
INTRAMUSCULAR | Status: AC
  Filled 2019-07-03: qty 40

## 2019-07-03 MED ORDER — SUGAMMADEX SODIUM 200 MG/2ML IV SOLN
INTRAVENOUS | Status: DC | PRN
  Administered 2019-07-03: 200 mg via INTRAVENOUS

## 2019-07-03 MED ORDER — LIDOCAINE 2% (20 MG/ML) 5 ML SYRINGE
INTRAMUSCULAR | Status: DC | PRN
  Administered 2019-07-03: 75 mg via INTRAVENOUS

## 2019-07-03 MED ORDER — LACTATED RINGERS IV SOLN: INTRAVENOUS | Status: DC | PRN

## 2019-07-03 MED ORDER — LIDOCAINE 20MG/ML (2%) 15 ML SYRINGE OPTIME
INTRAMUSCULAR | Status: DC | PRN
  Administered 2019-07-03: 1.5 mg/kg/h via INTRAVENOUS

## 2019-07-03 MED ORDER — ROCURONIUM BROMIDE 10 MG/ML (PF) SYRINGE
PREFILLED_SYRINGE | INTRAVENOUS | Status: DC | PRN
  Administered 2019-07-03: 20 mg via INTRAVENOUS
  Administered 2019-07-03: 30 mg via INTRAVENOUS
  Administered 2019-07-03: 50 mg via INTRAVENOUS

## 2019-07-03 MED ORDER — ACETAMINOPHEN 500 MG PO TABS
1000.0000 mg | ORAL_TABLET | ORAL | Status: AC
  Administered 2019-07-03: 11:00:00 1000 mg via ORAL
  Filled 2019-07-03: qty 2

## 2019-07-03 MED ORDER — SCOPOLAMINE 1 MG/3DAYS TD PT72
1.0000 | MEDICATED_PATCH | TRANSDERMAL | Status: DC
  Administered 2019-07-03: 1.5 mg via TRANSDERMAL
  Filled 2019-07-03: qty 1

## 2019-07-03 MED ORDER — KETOROLAC TROMETHAMINE 30 MG/ML IJ SOLN
INTRAMUSCULAR | Status: DC | PRN
Start: 2019-07-03 — End: 2019-07-03
  Administered 2019-07-03: 30 mg via INTRAVENOUS

## 2019-07-03 MED ORDER — OXYCODONE HCL 5 MG PO TABS
5.0000 mg | ORAL_TABLET | Freq: Once | ORAL | Status: AC | PRN
  Administered 2019-07-03: 5 mg via ORAL

## 2019-07-03 MED ORDER — DEXAMETHASONE SODIUM PHOSPHATE 4 MG/ML IJ SOLN: 4.0000 mg | INTRAMUSCULAR | Status: DC

## 2019-07-03 MED ORDER — OXYCODONE HCL 5 MG/5ML PO SOLN: 5.0000 mg | Freq: Once | ORAL | Status: AC | PRN

## 2019-07-03 MED ORDER — FENTANYL CITRATE (PF) 250 MCG/5ML IJ SOLN
INTRAMUSCULAR | Status: DC | PRN
  Administered 2019-07-03 (×3): 100 ug via INTRAVENOUS
  Administered 2019-07-03 (×2): 50 ug via INTRAVENOUS
  Administered 2019-07-03: 100 ug via INTRAVENOUS

## 2019-07-03 MED ORDER — MEPERIDINE HCL 50 MG/ML IJ SOLN: 6.2500 mg | INTRAMUSCULAR | Status: DC | PRN

## 2019-07-03 MED ORDER — LACTATED RINGERS IR SOLN
Status: DC | PRN
  Administered 2019-07-03: 1

## 2019-07-03 MED ORDER — LIDOCAINE HCL 2 % IJ SOLN
INTRAMUSCULAR | Status: AC
  Filled 2019-07-03: qty 20

## 2019-07-03 MED ORDER — CEFAZOLIN SODIUM-DEXTROSE 2-4 GM/100ML-% IV SOLN
INTRAVENOUS | Status: AC
  Filled 2019-07-03: qty 100

## 2019-07-03 MED ORDER — LACTATED RINGERS IV SOLN: INTRAVENOUS | Status: DC

## 2019-07-03 MED ORDER — KETOROLAC TROMETHAMINE 30 MG/ML IJ SOLN
INTRAMUSCULAR | Status: AC
  Filled 2019-07-03: qty 1

## 2019-07-03 MED ORDER — DEXAMETHASONE SODIUM PHOSPHATE 10 MG/ML IJ SOLN
INTRAMUSCULAR | Status: DC | PRN
  Administered 2019-07-03: 5 mg via INTRAVENOUS

## 2019-07-03 MED ORDER — MIDAZOLAM HCL 2 MG/2ML IJ SOLN
INTRAMUSCULAR | Status: AC
  Filled 2019-07-03: qty 2

## 2019-07-03 MED ORDER — ONDANSETRON HCL 4 MG/2ML IJ SOLN
INTRAMUSCULAR | Status: AC
  Filled 2019-07-03: qty 2

## 2019-07-03 MED ORDER — CEFAZOLIN SODIUM-DEXTROSE 2-4 GM/100ML-% IV SOLN
2.0000 g | Freq: Once | INTRAVENOUS | Status: AC
  Administered 2019-07-03: 2 g via INTRAVENOUS

## 2019-07-03 MED ORDER — GABAPENTIN 300 MG PO CAPS
300.0000 mg | ORAL_CAPSULE | ORAL | Status: AC
  Administered 2019-07-03: 11:00:00 300 mg via ORAL
  Filled 2019-07-03: qty 1

## 2019-07-03 MED ORDER — HEPARIN SODIUM (PORCINE) 5000 UNIT/ML IJ SOLN
5000.0000 [IU] | INTRAMUSCULAR | Status: AC
  Administered 2019-07-03: 11:00:00 5000 [IU] via SUBCUTANEOUS
  Filled 2019-07-03: qty 1

## 2019-07-03 MED ORDER — DEXAMETHASONE SODIUM PHOSPHATE 10 MG/ML IJ SOLN
INTRAMUSCULAR | Status: AC
  Filled 2019-07-03: qty 1

## 2019-07-03 MED ORDER — HYDRALAZINE HCL 20 MG/ML IJ SOLN
10.0000 mg | Freq: Once | INTRAMUSCULAR | Status: AC
  Administered 2019-07-03: 10 mg via INTRAVENOUS

## 2019-07-03 MED ORDER — PROPOFOL 10 MG/ML IV BOLUS
INTRAVENOUS | Status: DC | PRN
  Administered 2019-07-03: 150 mg via INTRAVENOUS
  Administered 2019-07-03: 50 mg via INTRAVENOUS

## 2019-07-03 MED ORDER — BUPIVACAINE HCL 0.25 % IJ SOLN
INTRAMUSCULAR | Status: AC
  Filled 2019-07-03: qty 1

## 2019-07-03 MED ORDER — HYDROMORPHONE HCL 1 MG/ML IJ SOLN
0.2500 mg | INTRAMUSCULAR | Status: DC | PRN
  Administered 2019-07-03: 0.5 mg via INTRAVENOUS
  Administered 2019-07-03: 0.25 mg via INTRAVENOUS
  Administered 2019-07-03: 0.5 mg via INTRAVENOUS

## 2019-07-03 MED ORDER — HYDROMORPHONE HCL 1 MG/ML IJ SOLN
INTRAMUSCULAR | Status: AC
  Administered 2019-07-03: 17:00:00 0.25 mg via INTRAVENOUS
  Filled 2019-07-03: qty 1

## 2019-07-03 MED ORDER — STERILE WATER FOR IRRIGATION IR SOLN
Status: DC | PRN
  Administered 2019-07-03: 1000 mL

## 2019-07-03 MED ORDER — ROCURONIUM BROMIDE 10 MG/ML (PF) SYRINGE
PREFILLED_SYRINGE | INTRAVENOUS | Status: AC
  Filled 2019-07-03: qty 10

## 2019-07-03 MED ORDER — BUPIVACAINE HCL 0.25 % IJ SOLN
INTRAMUSCULAR | Status: DC | PRN
  Administered 2019-07-03: 50 mL

## 2019-07-03 MED ORDER — LIDOCAINE 2% (20 MG/ML) 5 ML SYRINGE
INTRAMUSCULAR | Status: AC
  Filled 2019-07-03: qty 5

## 2019-07-03 SURGICAL SUPPLY — 76 items
APPLICATOR SURGIFLO ENDO (HEMOSTASIS) IMPLANT
BAG LAPAROSCOPIC 12 15 PORT 16 (BASKET) IMPLANT
BAG RETRIEVAL 12/15 (BASKET)
BLADE SURG SZ10 CARB STEEL (BLADE) IMPLANT
CATH ROBINSON RED A/P 16FR (CATHETERS) ×2 IMPLANT
COVER BACK TABLE 60X90IN (DRAPES) ×2 IMPLANT
COVER TIP SHEARS 8 DVNC (MISCELLANEOUS) ×1 IMPLANT
COVER TIP SHEARS 8MM DA VINCI (MISCELLANEOUS) ×1
COVER WAND RF STERILE (DRAPES) IMPLANT
DECANTER SPIKE VIAL GLASS SM (MISCELLANEOUS) IMPLANT
DERMABOND ADVANCED (GAUZE/BANDAGES/DRESSINGS) ×1
DERMABOND ADVANCED .7 DNX12 (GAUZE/BANDAGES/DRESSINGS) ×1 IMPLANT
DRAPE ARM DVNC X/XI (DISPOSABLE) ×4 IMPLANT
DRAPE COLUMN DVNC XI (DISPOSABLE) ×1 IMPLANT
DRAPE DA VINCI XI ARM (DISPOSABLE) ×4
DRAPE DA VINCI XI COLUMN (DISPOSABLE) ×1
DRAPE SHEET LG 3/4 BI-LAMINATE (DRAPES) ×2 IMPLANT
DRAPE SURG IRRIG POUCH 19X23 (DRAPES) ×2 IMPLANT
DRAPE UNDERBUTTOCKS STRL (DISPOSABLE) ×2 IMPLANT
DRSG OPSITE POSTOP 4X6 (GAUZE/BANDAGES/DRESSINGS) IMPLANT
DRSG OPSITE POSTOP 4X8 (GAUZE/BANDAGES/DRESSINGS) IMPLANT
DRSG TELFA 3X8 NADH (GAUZE/BANDAGES/DRESSINGS) ×2 IMPLANT
ELECT REM PT RETURN 15FT ADLT (MISCELLANEOUS) ×2 IMPLANT
GAUZE 4X4 16PLY RFD (DISPOSABLE) ×2 IMPLANT
GLOVE BIO SURGEON STRL SZ 6 (GLOVE) ×8 IMPLANT
GLOVE BIO SURGEON STRL SZ 6.5 (GLOVE) IMPLANT
GOWN STRL REUS W/ TWL LRG LVL3 (GOWN DISPOSABLE) ×4 IMPLANT
GOWN STRL REUS W/TWL LRG LVL3 (GOWN DISPOSABLE) ×4
HOLDER FOLEY CATH W/STRAP (MISCELLANEOUS) ×2 IMPLANT
IRRIG SUCT STRYKERFLOW 2 WTIP (MISCELLANEOUS) ×2
IRRIGATION SUCT STRKRFLW 2 WTP (MISCELLANEOUS) ×1 IMPLANT
KIT BASIN (CUSTOM PROCEDURE TRAY) ×2 IMPLANT
KIT PROCEDURE DA VINCI SI (MISCELLANEOUS)
KIT PROCEDURE DVNC SI (MISCELLANEOUS) IMPLANT
KIT TURNOVER KIT A (KITS) IMPLANT
MANIPULATOR UTERINE 4.5 ZUMI (MISCELLANEOUS) ×2 IMPLANT
NEEDLE HYPO 21X1.5 SAFETY (NEEDLE) ×2 IMPLANT
NEEDLE SPNL 18GX3.5 QUINCKE PK (NEEDLE) IMPLANT
NEEDLE SPNL 22GX3.5 QUINCKE BK (NEEDLE) ×2 IMPLANT
NS IRRIG 1000ML POUR BTL (IV SOLUTION) ×2 IMPLANT
OBTURATOR OPTICAL STANDARD 8MM (TROCAR) ×1
OBTURATOR OPTICAL STND 8 DVNC (TROCAR) ×1
OBTURATOR OPTICALSTD 8 DVNC (TROCAR) ×1 IMPLANT
PACK LITHOTOMY IV (CUSTOM PROCEDURE TRAY) ×2 IMPLANT
PACK ROBOT GYN WLCUSTOM (TRAY / TRAY PROCEDURE) ×2 IMPLANT
PAD OB MATERNITY 4.3X12.25 (PERSONAL CARE ITEMS) ×2 IMPLANT
PAD POSITIONING PINK XL (MISCELLANEOUS) ×2 IMPLANT
PENCIL SMOKE EVACUATOR (MISCELLANEOUS) IMPLANT
PORT ACCESS TROCAR AIRSEAL 12 (TROCAR) ×1 IMPLANT
PORT ACCESS TROCAR AIRSEAL 5M (TROCAR) ×1
POUCH SPECIMEN RETRIEVAL 10MM (ENDOMECHANICALS) IMPLANT
SCRUB CHG 4% DYNA-HEX 4OZ (MISCELLANEOUS) ×2 IMPLANT
SEAL CANN UNIV 5-8 DVNC XI (MISCELLANEOUS) ×3 IMPLANT
SEAL XI 5MM-8MM UNIVERSAL (MISCELLANEOUS) ×3
SET TRI-LUMEN FLTR TB AIRSEAL (TUBING) ×2 IMPLANT
SLEEVE XCEL OPT CAN 5 100 (ENDOMECHANICALS) ×2 IMPLANT
SPONGE LAP 18X18 RF (DISPOSABLE) IMPLANT
SURGIFLO W/THROMBIN 8M KIT (HEMOSTASIS) IMPLANT
SURGILUBE 2OZ TUBE FLIPTOP (MISCELLANEOUS) ×2 IMPLANT
SUT MNCRL AB 4-0 PS2 18 (SUTURE) IMPLANT
SUT PDS AB 1 TP1 96 (SUTURE) IMPLANT
SUT VIC AB 0 CT1 27 (SUTURE)
SUT VIC AB 0 CT1 27XBRD ANTBC (SUTURE) IMPLANT
SUT VIC AB 2-0 CT1 27 (SUTURE)
SUT VIC AB 2-0 CT1 TAPERPNT 27 (SUTURE) IMPLANT
SUT VICRYL 4-0 PS2 18IN ABS (SUTURE) ×4 IMPLANT
SYR 10ML LL (SYRINGE) IMPLANT
SYR BULB IRRIGATION 50ML (SYRINGE) ×2 IMPLANT
TOWEL OR 17X26 10 PK STRL BLUE (TOWEL DISPOSABLE) ×2 IMPLANT
TOWEL OR NON WOVEN STRL DISP B (DISPOSABLE) ×2 IMPLANT
TRAP SPECIMEN MUCUS 40CC (MISCELLANEOUS) IMPLANT
TRAY FOLEY MTR SLVR 16FR STAT (SET/KITS/TRAYS/PACK) ×2 IMPLANT
TROCAR XCEL NON-BLD 5MMX100MML (ENDOMECHANICALS) IMPLANT
UNDERPAD 30X36 HEAVY ABSORB (UNDERPADS AND DIAPERS) ×4 IMPLANT
WATER STERILE IRR 1000ML POUR (IV SOLUTION) ×2 IMPLANT
YANKAUER SUCT BULB TIP 10FT TU (MISCELLANEOUS) ×2 IMPLANT

## 2019-07-03 NOTE — Op Note (Signed)
OPERATIVE NOTE  Pre-operative Diagnosis: Thickened endometrial lining, cervical stenosis  Post-operative Diagnosis: same, pyometra, no hyperplasia or malignancy on frozen section  Operation: Robotic-assisted laparoscopic total hysterectomy with right salpingoophorectomy  Surgeon: Jeral Pinch MD  Assistant Surgeon: Joylene John, NP  Anesthesia: GET  Urine Output: 150 cc  Operative Findings:  On EUA, cervix flush with the vaginal apex, no appreciable os. Uterus small and mobile. On intra-abdominal entry, normal left liver edge, diaphragm, stomach. Omentum adherent to the anterior abdominal wall from the pubic symphysis to the lower edge of the right liver along the midline and to the right. Some filmy adhesions of the omentum to the left sidewall and pelvic sidewall and over the previously resected left adnexa. Sigmoid colon also adherent to the left sidewall and pelvic sidewall. Left adnexa surgically absent, right normal in appearance. Uterus 6-8cm and normal in size, purulent material drained from the uterus upon manipulation to remove it after colpotomy. No intra-abdominal or pelvic evidence of disease.  Estimated Blood Loss:  less than 100 mL      Total IV Fluids: 1200 ml         Specimens: uterus, cervix, right tube and ovary         Complications:  None apparent; patient tolerated the procedure well.         Disposition: PACU - hemodynamically stable.  Procedure Details  The patient was seen in the Holding Room. The risks, benefits, complications, treatment options, and expected outcomes were discussed with the patient.  The patient concurred with the proposed plan, giving informed consent.  The site of surgery properly noted/marked. The patient was identified as Courtney Hamilton and the procedure verified as a dilation and curettage, possible hysteroscopic and or laparoscopic assistance, possible robotic-assisted hysterectomy with bilateral salpingo oophorectomy, possible  sentinel lymph node biopsy, possible lymph node dissection, and possible laparotomy.   After induction of anesthesia, the patient was draped and prepped in the usual sterile manner. Patient was placed in supine position after anesthesia and draped and prepped in the usual sterile manner as follows: Her arms were tucked to her side with all appropriate precautions.  The shoulders were stabilized with padded shoulder blocks applied to the acromium processes.  The patient was placed in the semi-lithotomy position in [place].  The perineum and vagina were prepped with Betadine. The patient was draped after the CholoraPrep had been allowed to dry for 3 minutes.  A Time Out was held and the above information confirmed.  The urethra was prepped with Betadine. Foley catheter was placed.  A sterile speculum was placed in the vagina.  No cervical os was clearly identified.  Next, a 10 mm skin incision was made 1 cm below the subcostal margin in the midclavicular line.  The 5 mm Optiview port and scope was used for direct entry.  Opening pressure was under 10 mm CO2.  The abdomen was insufflated and the findings were noted as above.   At this point and all points during the procedure, the patient's intra-abdominal pressure did not exceed 15 mmHg.  Given significant abdominal adhesions, decision made to place 2 additional robotic 8 mm ports in the left abdomen under direct visualization.  A combination of sharp dissection and electrocautery was then used to lyse adhesions of the omentum to the anterior abdominal wall for approximately 45 minutes, until the omentum was freed from just superior to the level of the ports.  At this time a supraumbilical and right lateral 8 mm port  were also placed under direct visualization.  0.25% Marcaine was injected prior to each incision and trocar placement for local anesthesia. The patient was placed in steep Trendelenburg.  The robot was docked in the normal  manner.  Attention was then turned to lysis of adhesions of the omentum and sigmoid colon mesentery to the left sidewall and pelvic sidewall as well as overlying the surgically excised left adnexa.  The right and left peritoneum were opened parallel to the IP ligament to open the retroperitoneal spaces bilaterally. The round ligaments were transected. The ureter was noted to be on the medial leaf of the broad ligament.  The peritoneum above the ureter was incised and stretched and the infundibulopelvic ligament was skeletonized, cauterized and cut.    The posterior peritoneum was taken down to the level of the EEA sizer, which was placed int he vagina to delineate the cervicovaginal junction. The anterior peritoneum was also taken down.  The bladder flap was created to the level of the upper vagina, again using the EEA sizer as a guide.  The uterine artery on the right side was skeletonized, cauterized and cut in the normal manner.  A similar procedure was performed on the left.  The colpotomy was made and the uterus, cervix, bilateral ovaries and tubes were amputated and delivered through the vagina.  Pedicles were inspected and excellent hemostasis was achieved.  Purulent material was noted from the uterus during manipulation of the uterus to remove it vaginally.  Copious irrigation was performed.  The colpotomy at the vaginal cuff was closed with Vicryl on a CT1 needle in a running manner.  Irrigation was used and excellent hemostasis was achieved.  The omentum and upper abdomen were inspected with no obvious ongoing bleeding.  At this point in the procedure was completed.  Robotic instruments were removed under direct visulaization.  The robot was undocked. The fascia at the 10-12 mm port was closed with 0 Vicryl on a UR-5 needle.  The subcuticular tissue was closed with 4-0 Vicryl and the skin was closed with 4-0 Monocryl in a subcuticular manner.  Dermabond was applied.    The vagina was swabbed with   minimal bleeding noted.   All sponge, lap and needle counts were correct x  3.  Foley catheter was removed.  The patient was transferred to the recovery room in stable condition.  Jeral Pinch, MD

## 2019-07-03 NOTE — Discharge Instructions (Addendum)
07/03/2019  Return to work: 4-6 weeks if applicable  You can use tylenol and ibuprofen alternating (take one or the other every 6 hours). Continue with your regimen with the tramadol. We will contact you tomorrow and see how you are doing but if you are having any issues including uncontrolled pain with the above regimen and tramadol use, please call the office at [phone] or the after hours number at [phone] and let the after hours service know that you just had surgery and they will get in touch with Dr. Berline Lopes.  Activity: 1. Be up and out of the bed during the day.  Take a nap if needed.  You may walk up steps but be careful and use the hand rail.  Stair climbing will tire you more than you think, you may need to stop part way and rest. You need to be out of bed moving around throughout the day to prevent blood clots.   2. No lifting or straining over 10 pounds for 6 weeks.  3. No driving for around 1 week(s).  Do not drive if you are taking narcotic pain medicine. You want to make sure your reaction time is back to normal so you can break safely.  4. Shower daily.  Use soap and water on your incision and pat dry; don't rub.  No tub baths until cleared by your surgeon.   5. No sexual activity and nothing in the vagina for 8 weeks.  6. You may experience a small amount of clear drainage from your incisions, which is normal.  If the drainage persists or increases, please call the office.  7. You may experience vaginal spotting after surgery or around the 6-8 week mark from surgery when the stitches at the top of the vagina begin to dissolve.  The spotting is normal but if you experience heavy bleeding, call our office.  8. Take Tylenol or ibuprofen first for pain and only use narcotic pain medication for severe pain not relieved by the Tylenol or Ibuprofen.  Monitor your Tylenol intake to a max of 4,000 mg.  Diet: 1. Low sodium Heart Healthy Diet is recommended.  2. It is safe to  use a laxative, such as Miralax or Colace, if you have difficulty moving your bowels. You can take Sennakot at bedtime every evening to keep bowel movements regular and to prevent constipation.    Wound Care: 1. Keep clean and dry.  Shower daily.  Reasons to call the Doctor:  Fever - Oral temperature greater than 100.4 degrees Fahrenheit  Foul-smelling vaginal discharge  Difficulty urinating  Nausea and vomiting  Increased pain at the site of the incision that is unrelieved with pain medicine.  Difficulty breathing with or without chest pain  New calf pain especially if only on one side  Sudden, continuing increased vaginal bleeding with or without clots.   Contacts: For questions or concerns you should contact:  Dr. Jeral Pinch at [phone]  Courtney John, NP at [phone]  After Hours: call [phone] and have the GYN Oncologist paged/contacted

## 2019-07-03 NOTE — Anesthesia Postprocedure Evaluation (Signed)
Anesthesia Post Note  Patient: Courtney Hamilton  Procedure(s) Performed: LAPAROSCOPY DIAGNOSTIC; LYSIS OF ADHESIONS (N/A ) XI ROBOTIC ASSISTED TOTAL HYSTERECTOMY WITH UNILATERAL SALPINGO OOPHORECTOMY (N/A )     Patient location during evaluation: PACU Anesthesia Type: General Level of consciousness: awake and alert Pain management: pain level controlled Vital Signs Assessment: post-procedure vital signs reviewed and stable Respiratory status: spontaneous breathing, nonlabored ventilation and respiratory function stable Cardiovascular status: blood pressure returned to baseline and stable Postop Assessment: no apparent nausea or vomiting Anesthetic complications: no    Last Vitals:  Vitals:   07/03/19 1551 07/03/19 1600  BP: (!) 182/111 (!) 184/110  Pulse: 96 86  Resp: 15 19  Temp: 36.7 C   SpO2: 100% 100%    Last Pain:  Vitals:   07/03/19 1551  TempSrc:   PainSc: 10-Worst pain ever                 Lynda Rainwater

## 2019-07-03 NOTE — Anesthesia Procedure Notes (Signed)
Procedure Name: Intubation Date/Time: 07/03/2019 12:47 PM Performed by: Lollie Sails, CRNA Pre-anesthesia Checklist: Patient identified, Emergency Drugs available, Suction available, Patient being monitored and Timeout performed Patient Re-evaluated:Patient Re-evaluated prior to induction Oxygen Delivery Method: Circle system utilized Preoxygenation: Pre-oxygenation with 100% oxygen Induction Type: IV induction Ventilation: Mask ventilation without difficulty Laryngoscope Size: Miller and 3 Grade View: Grade I Tube type: Oral Tube size: 7.0 mm Number of attempts: 1 Airway Equipment and Method: Stylet Placement Confirmation: ETT inserted through vocal cords under direct vision,  positive ETCO2 and breath sounds checked- equal and bilateral Secured at: 22 cm Tube secured with: Tape Dental Injury: Teeth and Oropharynx as per pre-operative assessment

## 2019-07-03 NOTE — Interval H&P Note (Signed)
History and Physical Interval Note:  07/03/2019 10:41 AM  Courtney Hamilton  has presented today for surgery, with the diagnosis of THICKENED ENDOMETRIUM AND CERVICAL STENOSIS.  The various methods of treatment have been discussed with the patient and family. After consideration of risks, benefits and other options for treatment, the patient has consented to  Procedure(s): DILATATION AND CURETTAGE POSSIBLE LAPAROTOMY (N/A) HYSTEROSCOPY (N/A) LAPAROSCOPY DIAGNOSTIC (N/A) POSSIBLE  XI ROBOTIC ASSISTED TOTAL HYSTERECTOMY WITH BILATERAL SALPINGO OOPHORECTOMY (N/A) POSSIBLE SENTINEL NODE BIOPSY (N/A) POSSIBLE  LYMPH NODE DISSECTION (N/A) as a surgical intervention.  The patient's history has been reviewed, patient examined, no change in status, stable for surgery.  I have reviewed the patient's chart and labs.  Questions were answered to the patient's satisfaction.     Lafonda Mosses

## 2019-07-03 NOTE — Anesthesia Preprocedure Evaluation (Signed)
Anesthesia Evaluation  Patient identified by MRN, date of birth, ID band Patient awake    Reviewed: Allergy & Precautions, NPO status , Patient's Chart, lab work & pertinent test results  History of Anesthesia Complications Negative for: history of anesthetic complications  Airway Mallampati: II  TM Distance: >3 FB Neck ROM: Full    Dental  (+) Dental Advisory Given, Upper Dentures   Pulmonary former smoker,    Pulmonary exam normal breath sounds clear to auscultation       Cardiovascular hypertension, Pt. on medications Normal cardiovascular exam Rhythm:Regular Rate:Normal     Neuro/Psych PSYCHIATRIC DISORDERS Anxiety  Neuromuscular disease    GI/Hepatic negative GI ROS, (+)     substance abuse (past history)  ,   Endo/Other  negative endocrine ROS  Renal/GU negative Renal ROS     Musculoskeletal negative musculoskeletal ROS (+) Arthritis ,   Abdominal   Peds  Hematology  (+) HIV,   Anesthesia Other Findings Day of surgery medications reviewed with the patient.  Reproductive/Obstetrics                             Anesthesia Physical  Anesthesia Plan  ASA: III  Anesthesia Plan: General   Post-op Pain Management:    Induction: Intravenous  PONV Risk Score and Plan: 3 and Midazolam, Dexamethasone, Ondansetron and Treatment may vary due to age or medical condition  Airway Management Planned: Oral ETT  Additional Equipment:   Intra-op Plan:   Post-operative Plan: Extubation in OR  Informed Consent: I have reviewed the patients History and Physical, chart, labs and discussed the procedure including the risks, benefits and alternatives for the proposed anesthesia with the patient or authorized representative who has indicated his/her understanding and acceptance.     Dental advisory given  Plan Discussed with: CRNA  Anesthesia Plan Comments:         Anesthesia  Quick Evaluation

## 2019-07-03 NOTE — Transfer of Care (Signed)
Immediate Anesthesia Transfer of Care Note  Patient: Courtney Hamilton  Procedure(s) Performed: LAPAROSCOPY DIAGNOSTIC; LYSIS OF ADHESIONS (N/A ) XI ROBOTIC ASSISTED TOTAL HYSTERECTOMY WITH UNILATERAL SALPINGO OOPHORECTOMY (N/A )  Patient Location: PACU  Anesthesia Type:General  Level of Consciousness: awake, alert  and patient cooperative  Airway & Oxygen Therapy: Patient Spontanous Breathing and Patient connected to face mask oxygen  Post-op Assessment: Report given to RN and Post -op Vital signs reviewed and stable  Post vital signs: Reviewed and stable  Last Vitals:  Vitals Value Taken Time  BP 166/128 07/03/19 1551  Temp    Pulse 70 07/03/19 1555  Resp 8 07/03/19 1555  SpO2 100 % 07/03/19 1555  Vitals shown include unvalidated device data.  Last Pain:  Vitals:   07/03/19 1030  TempSrc: Oral         Complications: No apparent anesthesia complications

## 2019-07-03 NOTE — Brief Op Note (Signed)
07/03/2019  3:46 PM  PATIENT:  Courtney Hamilton  59 y.o. female  PRE-OPERATIVE DIAGNOSIS:  THICKENED ENDOMETRIUM AND CERVICAL STENOSIS  POST-OPERATIVE DIAGNOSIS:  THICKENED ENDOMETRIUM AND CERVICAL STENOSIS, purulent material inside the uterus, no evidence of cancer on frozen section  PROCEDURE:  Procedure(s): LAPAROSCOPY DIAGNOSTIC; LYSIS OF ADHESIONS (N/A) XI ROBOTIC ASSISTED TOTAL HYSTERECTOMY WITH UNILATERAL SALPINGO OOPHORECTOMY (N/A)  SURGEON:  Surgeon(s) and Role:    Lafonda Mosses, MD - Primary  ASSISTANTS: Joylene John NP  ANESTHESIA:   local and general  EBL:  75 mL   BLOOD ADMINISTERED:none  DRAINS: none   LOCAL MEDICATIONS USED:  MARCAINE     SPECIMEN:  uterus, cervix, right adnexa  DISPOSITION OF SPECIMEN:  PATHOLOGY  COUNTS:  YES  TOURNIQUET:  * No tourniquets in log *  DICTATION: .Note written in EPIC  PLAN OF CARE: Discharge to home after PACU  PATIENT DISPOSITION:  PACU - hemodynamically stable.   Delay start of Pharmacological VTE agent (>24hrs) due to surgical blood loss or risk of bleeding: not applicable

## 2019-07-04 ENCOUNTER — Other Ambulatory Visit: Payer: Self-pay | Admitting: Gynecologic Oncology

## 2019-07-04 ENCOUNTER — Telehealth: Payer: Self-pay | Admitting: *Deleted

## 2019-07-04 DIAGNOSIS — G8918 Other acute postprocedural pain: Secondary | ICD-10-CM

## 2019-07-04 MED ORDER — OXYCODONE HCL 5 MG PO TABS: 5.0000 mg | ORAL_TABLET | ORAL | 0 refills | Status: DC | PRN

## 2019-07-04 NOTE — Telephone Encounter (Signed)
Courtney Hamilton called requesting medication for break though pain. She states that her pain is 10/10 with taking Ibuprofen and tramadol. She had some soup last night, but nothing to eat this morning. She is sipping on coffee and water. She denies nausea or vomiting. Pt is passing gas, but has not had a BM yet. She reports that she forgot to take her Senokot, but will take it today. Pt also reports having some mild burning with urination. Pt denies fever or chills. Pt  Reports incision to be c/d/i. Pt encourage to increase her water intake to 64 oz per day.   Spoke with patient and let her know that a prescription for pain meds has been sent. Pt states that she is  Up and around and had a BM.

## 2019-07-04 NOTE — Progress Notes (Signed)
See RN note.  Patient calling reporting severe pain with no relief with chronic pain med, tramadol.  Plan to send in oxycodone to be taken for severe pain and to not be taken with the tramadol.

## 2019-07-06 LAB — SURGICAL PATHOLOGY

## 2019-07-07 ENCOUNTER — Inpatient Hospital Stay (HOSPITAL_BASED_OUTPATIENT_CLINIC_OR_DEPARTMENT_OTHER): Payer: Medicare HMO | Admitting: Gynecologic Oncology

## 2019-07-07 ENCOUNTER — Encounter: Payer: Self-pay | Admitting: Gynecologic Oncology

## 2019-07-07 DIAGNOSIS — D071 Carcinoma in situ of vulva: Secondary | ICD-10-CM

## 2019-07-07 NOTE — Progress Notes (Signed)
Gynecologic Oncology Telehealth Consult Note: Gyn-Onc  I connected with Courtney Hamilton on 07/07/19 at 11:00 AM EDT by telephone and verified that I am speaking with the correct person using two identifiers.  I discussed the limitations, risks, security and privacy concerns of performing an evaluation and management service by telemedicine and the availability of in-person appointments. I also discussed with the patient that there may be a patient responsible charge related to this service. The patient expressed understanding and agreed to proceed.  Other persons participating in the visit and their role in the encounter: none.  Patient's location: home Provider's location: [facility]  Reason for Visit: post-op follow-up   Interval History: Status post robotic assisted total hysterectomy and right salpingo-oophorectomy for thickened endometrial lining with no way of sampling the endometrium due to severe cervical stenosis in the setting of cervical dysplasia with remote history of cryotherapy.  Patient is overall healing well from surgery.  She denies any fevers or chills.  She reports moderate appetite that is improving with no nausea or emesis.  She reports return of bowel function and denies any urinary symptoms.  She denies vaginal discharge, has had some small amount of vaginal spotting.  Past Medical/Surgical History: Past Medical History:  Diagnosis Date  . Arthritis    right hip  . Bronchitis winter 2018  . Chronic kidney disease    History of   . Hepatitis    NON A NON B  . Herniated disc 10/07/2011   lower back  . HIV (human immunodeficiency virus infection) ([place])   . Hypertension   . Lower GI bleed 05/10/2012   pt denies  . Neuropathy   . Osteoporosis   . Ruptured lumbar disc   . Sciatica   . Substance abuse ([place])    past hsitory  clean more than 20 years   . TB (pulmonary tuberculosis) 1993    exposure, treated   . VIN III (vulvar intraepithelial neoplasia III)     Past  Surgical History:  Procedure Laterality Date  . APPENDECTOMY    . BREAST EXCISIONAL BIOPSY Left 2001 per pt   cyst removed   . CHOLECYSTECTOMY    . ECTOPIC PREGNANCY SURGERY    . injections to lower back  09/2017  . LAPAROSCOPY N/A 07/03/2019   Procedure: LAPAROSCOPY DIAGNOSTIC; LYSIS OF ADHESIONS;  Surgeon: Lafonda Mosses, MD;  Location: [facility];  Service: Gynecology;  Laterality: N/A;  . ROBOTIC ASSISTED TOTAL HYSTERECTOMY WITH BILATERAL SALPINGO OOPHERECTOMY N/A 07/03/2019   Procedure: XI ROBOTIC ASSISTED TOTAL HYSTERECTOMY WITH UNILATERAL SALPINGO OOPHORECTOMY;  Surgeon: Lafonda Mosses, MD;  Location: [facility];  Service: Gynecology;  Laterality: N/A;  . SALIVARY GLAND SURGERY    . VULVECTOMY N/A 01/18/2018   Procedure: PARTIAL VULVECTOMY;  Surgeon: [name], MD;  Location[facility];  Service: Gynecology;  Laterality: N/A;    Family History  Problem Relation Age of Onset  . Asthma Father   . COPD Father   . Cancer Mother   . Diabetes Brother   . Hypertension Brother   . Diabetes Brother   . Breast cancer Sister   . Colon cancer Maternal Uncle     Social History   Socioeconomic History  . Marital status: Married    Spouse name: Not on file  . Number of children: Not on file  . Years of education: Not on file  . Highest education level: Not on file  Occupational History  . Not on file  Tobacco Use  .  Smoking status: Former Smoker    Packs/day: 0.50    Years: 30.00    Pack years: 15.00    Types: Cigarettes  . Smokeless tobacco: Never Used  . Tobacco comment: quit march 2018  Substance and Sexual Activity  . Alcohol use: No  . Drug use: Yes    Types: Marijuana    Comment: past history of cocaine  and alcohol  none in 20 years  . Sexual activity: Yes    Partners: Male    Comment: CONDOMS GIVEN  Other Topics Concern  . Not on file  Social History Narrative  . Not on file   Social Determinants of Health   Financial  Resource Strain:   . Difficulty of Paying Living Expenses:   Food Insecurity:   . Worried About Charity fundraiser in the Last Year:   . Arboriculturist in the Last Year:   Transportation Needs:   . Film/video editor (Medical):   Marland Kitchen Lack of Transportation (Non-Medical):   Physical Activity:   . Days of Exercise per Week:   . Minutes of Exercise per Session:   Stress:   . Feeling of Stress :   Social Connections:   . Frequency of Communication with Friends and Family:   . Frequency of Social Gatherings with Friends and Family:   . Attends Religious Services:   . Active Member of Clubs or Organizations:   . Attends Archivist Meetings:   Marland Kitchen Marital Status:     Current Medications:  Current Outpatient Medications:  .  acyclovir ointment (ZOVIRAX) 5 %, Apply 1 application topically every 3 (three) hours. (Patient taking differently: Apply 1 application topically every 3 (three) hours as needed (irritation.). ), Disp: 30 g, Rfl: 0 .  Ascorbic Acid (VITAMIN C) 1000 MG tablet, Take 1,000 mg by mouth at bedtime., Disp: , Rfl:  .  Biotin 1000 MCG tablet, Take 2,000 mcg by mouth daily in the afternoon., Disp: , Rfl:  .  DESCOVY 200-25 MG tablet, TAKE 1 TABLET BY MOUTH DAILY., Disp: 30 tablet, Rfl: 5 .  gabapentin (NEURONTIN) 300 MG capsule, TAKE 1 CAPSULE BY MOUTH THREE TIMES A DAY (Patient taking differently: Take 600 mg by mouth at bedtime. ), Disp: 270 capsule, Rfl: 1 .  ibuprofen (ADVIL) 600 MG tablet, Take 1 tablet (600 mg total) by mouth every 6 (six) hours as needed. (Patient taking differently: Take 600 mg by mouth every 6 (six) hours as needed (cramps). ), Disp: 30 tablet, Rfl: 1 .  losartan (COZAAR) 25 MG tablet, Take 1 tablet (25 mg total) by mouth daily. (Patient taking differently: Take 25 mg by mouth daily in the afternoon. ), Disp: 30 tablet, Rfl: 11 .  Multiple Minerals-Vitamins (CAL-MAG ZINC II PO), Take 1 tablet by mouth daily in the afternoon., Disp: , Rfl:  .   Omega-3 Fatty Acids (FISH OIL PO), Take 2,000 mg by mouth daily as needed (hot flashes.)., Disp: , Rfl:  .  oxyCODONE (OXY IR/ROXICODONE) 5 MG immediate release tablet, Take 1 tablet (5 mg total) by mouth every 4 (four) hours as needed for severe pain. For SEVERE pain, do not take with tramadol, do not take and drive, Disp: 10 tablet, Rfl: 0 .  oxyCODONE-acetaminophen (PERCOCET/ROXICET) 5-325 MG tablet, Take 1 tablet by mouth every 6 (six) hours as needed. (Patient not taking: Reported on 06/22/2019), Disp: 10 tablet, Rfl: 0 .  TIVICAY 50 MG tablet, TAKE 1 TABLET (50 MG TOTAL) BY MOUTH DAILY.,  Disp: 30 tablet, Rfl: 5 .  traMADol (ULTRAM) 50 MG tablet, Take 2 tablets (100 mg total) by mouth 3 (three) times daily as needed for moderate pain., Disp: 15 tablet, Rfl: 0 .  zolpidem (AMBIEN) 10 MG tablet, Take 1 tablet (10 mg total) by mouth at bedtime as needed for up to 20 days for sleep. (Patient not taking: Reported on 06/22/2019), Disp: 30 tablet, Rfl: 0  Current Facility-Administered Medications:  .  acyclovir ointment (ZOVIRAX) 5 %, , Topical, Q3H, Sloan Leiter, MD  Review of Symptoms: + vaginal bleeding, otherwise ROS negative.  Physical Exam: There were no vitals taken for this visit. Deferred given limitations of phone visit.  Laboratory & Radiologic Studies: A. UTERUS, CERVIX, RIGHT FALLOPIAN TUBE AND OVARY, HYSTERECTOMY WITH  SALPINGO OOPHORECTOMY:  - Uterus:    Endometrium: Inactive endometrium. No hyperplasia or malignancy.    Myometrium: Unremarkable. No malignancy.    Serosa: Unremarkable. No malignancy.  - Cervix: Extensive high grade squamous intraepithelial lesion, CIN-III  (severe dysplasia/CIS).  - Right ovary: Unremarkable. No malignancy.  - Right fallopian tubes: Paratubal cysts. No malignancy.   The epithelium does not extend to the cauterized margin in most sections  and if epithelium is present there is no high grade squamous  intraepithelial lesion present  at the margin.  Assessment & Plan: Courtney Hamilton is a 59 y.o. woman with high-grade cervical dysplasia incidentally found on final pathology after hysterectomy for thickened endometrial lining with inability to sample in the postmenopausal setting.  Patient is overall doing well after surgery.  We discussed her normal postoperative course.  I also reviewed pathology findings.  She was happy with the news that no uterine pathology was noted on final review.  We discussed incidental finding of high-grade cervical dysplasia.  She has a history of an abnormal Pap smear several decades ago that required treatment with cryotherapy.  Given her HIV status, we discussed that she is at higher risk of recurrence with vaginal dysplasia.  She will need close follow-up with her gynecologist.  I will see the patient in several weeks for postoperative follow-up.  I discussed the assessment and treatment plan with the patient. The patient was provided with an opportunity to ask questions and all were answered. The patient agreed with the plan and demonstrated an understanding of the instructions.   The patient was advised to call back or see an in-person evaluation if the symptoms worsen or if the condition fails to improve as anticipated.   15 minutes of total time was spent for this patient encounter, including preparation, face-to-face counseling with the patient and coordination of care, and documentation of the encounter.   Jeral Pinch, MD  Division of Gynecologic Oncology  Department of Obstetrics and Gynecology  [facility]

## 2019-07-10 ENCOUNTER — Telehealth: Payer: Self-pay

## 2019-07-10 ENCOUNTER — Inpatient Hospital Stay: Payer: Medicare HMO | Admitting: Gynecologic Oncology

## 2019-07-10 NOTE — Telephone Encounter (Signed)
Courtney Hamilton called stating that she just noticed some brownish malodorous drainage from her naval incision. Incision not burning. Temp temporally 92.8. No other symptoms. Reviewed with Dr. Berline Lopes. Told Courtney Hamilton that Dr. Berline Lopes feels that it is old blood draining from the wound.Dr. Berline Lopes offered to see her tomorrow at 1245. Pt declined at this time.  She will continue to monitor the drainage and her temp. She will call the office tomorrow ~ 1000 with an update and decide if she wants or needs to be seen. Dr. Berline Lopes stated that she could send a picture of the incision and drainage via my chart tomrrow.

## 2019-07-10 NOTE — Telephone Encounter (Signed)
Courtney Hamilton states that her naval incision is burning and tingling. It is not draining any purulent drainage. Afebrile.Told her that this can occur as the incision heals. Suggested that she wear some loose clothing. She states that tylenol is effective for discomfort. Pt just wanted to be sure that she was healing appropriately. Pt to call if she runs a temp and or pussy drainage comes out of any incision. Pt verbalized understanding.

## 2019-07-11 ENCOUNTER — Other Ambulatory Visit: Payer: Self-pay

## 2019-07-11 ENCOUNTER — Inpatient Hospital Stay: Payer: Medicare HMO | Attending: Gynecologic Oncology | Admitting: Gynecologic Oncology

## 2019-07-11 ENCOUNTER — Encounter: Payer: Self-pay | Admitting: Gynecologic Oncology

## 2019-07-11 VITALS — BP 104/67 | HR 54 | Temp 98.2°F | Resp 18 | Ht 66.0 in | Wt 180.4 lb

## 2019-07-11 DIAGNOSIS — Z90710 Acquired absence of both cervix and uterus: Secondary | ICD-10-CM | POA: Diagnosis not present

## 2019-07-11 DIAGNOSIS — Z90722 Acquired absence of ovaries, bilateral: Secondary | ICD-10-CM | POA: Diagnosis not present

## 2019-07-11 DIAGNOSIS — D069 Carcinoma in situ of cervix, unspecified: Secondary | ICD-10-CM | POA: Insufficient documentation

## 2019-07-11 DIAGNOSIS — D071 Carcinoma in situ of vulva: Secondary | ICD-10-CM | POA: Insufficient documentation

## 2019-07-11 NOTE — Progress Notes (Signed)
Gynecologic Oncology Return Clinic Visit  07/11/19  Reason for Visit: Incision check  Interval History: Patient presents after her robotic hysterectomy for thickened endometrial lining with final pathology revealing high-grade cervical dysplasia on 4/26 secondary to concerns about one of her incisions.  She called yesterday reporting some brown-tinged discharge and odor from her supraumbilical incision.  She returned her call this morning and stated that this persisted.  She denies any fevers or chills.  She denies any vaginal bleeding, spotting or discharge.  She has some lower abdominal and pelvic pain that has improved each day since surgery.  She notes some pressure when she urinates or has a bowel movement and has to keep her leg straight in front of her instead of on the floor when she is sitting to use the restroom.  She reports regular bowel function.  She denies any fevers or chills.  Past Medical/Surgical History: Past Medical History:  Diagnosis Date  . Arthritis    right hip  . Bronchitis winter 2018  . Chronic kidney disease    History of   . Hepatitis    NON A NON B  . Herniated disc 10/07/2011   lower back  . HIV (human immunodeficiency virus infection) ([place])   . Hypertension   . Lower GI bleed 05/10/2012   pt denies  . Neuropathy   . Osteoporosis   . Ruptured lumbar disc   . Sciatica   . Substance abuse ([place])    past hsitory  clean more than 20 years   . TB (pulmonary tuberculosis) 1993    exposure, treated   . VIN III (vulvar intraepithelial neoplasia III)     Past Surgical History:  Procedure Laterality Date  . APPENDECTOMY    . BREAST EXCISIONAL BIOPSY Left 2001 per pt   cyst removed   . CHOLECYSTECTOMY    . ECTOPIC PREGNANCY SURGERY    . injections to lower back  09/2017  . LAPAROSCOPY N/A 07/03/2019   Procedure: LAPAROSCOPY DIAGNOSTIC; LYSIS OF ADHESIONS;  Surgeon: Lafonda Mosses, MD;  Location: [facility];  Service: Gynecology;  Laterality: N/A;  .  ROBOTIC ASSISTED TOTAL HYSTERECTOMY WITH BILATERAL SALPINGO OOPHERECTOMY N/A 07/03/2019   Procedure: XI ROBOTIC ASSISTED TOTAL HYSTERECTOMY WITH UNILATERAL SALPINGO OOPHORECTOMY;  Surgeon: Lafonda Mosses, MD;  Location: [facility];  Service: Gynecology;  Laterality: N/A;  . SALIVARY GLAND SURGERY    . VULVECTOMY N/A 01/18/2018   Procedure: PARTIAL VULVECTOMY;  Surgeon: [name], MD;  Location: [facility];  Service: Gynecology;  Laterality: N/A;    Family History  Problem Relation Age of Onset  . Asthma Father   . COPD Father   . Cancer Mother   . Diabetes Brother   . Hypertension Brother   . Diabetes Brother   . Breast cancer Sister   . Colon cancer Maternal Uncle     Social History   Socioeconomic History  . Marital status: Married    Spouse name: Not on file  . Number of children: Not on file  . Years of education: Not on file  . Highest education level: Not on file  Occupational History  . Not on file  Tobacco Use  . Smoking status: Former Smoker    Packs/day: 0.50    Years: 30.00    Pack years: 15.00    Types: Cigarettes  . Smokeless tobacco: Never Used  . Tobacco comment: quit march 2018  Substance and Sexual Activity  . Alcohol use: No  . Drug  use: Yes    Types: Marijuana    Comment: past history of cocaine  and alcohol  none in 20 years  . Sexual activity: Yes    Partners: Male    Comment: CONDOMS GIVEN  Other Topics Concern  . Not on file  Social History Narrative  . Not on file   Social Determinants of Health   Financial Resource Strain:   . Difficulty of Paying Living Expenses:   Food Insecurity:   . Worried About Charity fundraiser in the Last Year:   . Arboriculturist in the Last Year:   Transportation Needs:   . Film/video editor (Medical):   Marland Kitchen Lack of Transportation (Non-Medical):   Physical Activity:   . Days of Exercise per Week:   . Minutes of Exercise per Session:   Stress:   . Feeling of Stress :    Social Connections:   . Frequency of Communication with Friends and Family:   . Frequency of Social Gatherings with Friends and Family:   . Attends Religious Services:   . Active Member of Clubs or Organizations:   . Attends Archivist Meetings:   Marland Kitchen Marital Status:     Current Medications:  Current Outpatient Medications:  .  acyclovir ointment (ZOVIRAX) 5 %, Apply 1 application topically every 3 (three) hours. (Patient taking differently: Apply 1 application topically every 3 (three) hours as needed (irritation.). ), Disp: 30 g, Rfl: 0 .  Ascorbic Acid (VITAMIN C) 1000 MG tablet, Take 1,000 mg by mouth at bedtime., Disp: , Rfl:  .  Biotin 1000 MCG tablet, Take 2,000 mcg by mouth daily in the afternoon., Disp: , Rfl:  .  DESCOVY 200-25 MG tablet, TAKE 1 TABLET BY MOUTH DAILY., Disp: 30 tablet, Rfl: 5 .  gabapentin (NEURONTIN) 300 MG capsule, TAKE 1 CAPSULE BY MOUTH THREE TIMES A DAY (Patient taking differently: Take 600 mg by mouth at bedtime. ), Disp: 270 capsule, Rfl: 1 .  ibuprofen (ADVIL) 600 MG tablet, Take 1 tablet (600 mg total) by mouth every 6 (six) hours as needed. (Patient taking differently: Take 600 mg by mouth every 6 (six) hours as needed (cramps). ), Disp: 30 tablet, Rfl: 1 .  losartan (COZAAR) 25 MG tablet, Take 1 tablet (25 mg total) by mouth daily. (Patient taking differently: Take 25 mg by mouth daily in the afternoon. ), Disp: 30 tablet, Rfl: 11 .  Multiple Minerals-Vitamins (CAL-MAG ZINC II PO), Take 1 tablet by mouth daily in the afternoon., Disp: , Rfl:  .  Omega-3 Fatty Acids (FISH OIL PO), Take 2,000 mg by mouth daily as needed (hot flashes.)., Disp: , Rfl:  .  oxyCODONE (OXY IR/ROXICODONE) 5 MG immediate release tablet, Take 1 tablet (5 mg total) by mouth every 4 (four) hours as needed for severe pain. For SEVERE pain, do not take with tramadol, do not take and drive, Disp: 10 tablet, Rfl: 0 .  oxyCODONE-acetaminophen (PERCOCET/ROXICET) 5-325 MG tablet,  Take 1 tablet by mouth every 6 (six) hours as needed., Disp: 10 tablet, Rfl: 0 .  TIVICAY 50 MG tablet, TAKE 1 TABLET (50 MG TOTAL) BY MOUTH DAILY., Disp: 30 tablet, Rfl: 5 .  traMADol (ULTRAM) 50 MG tablet, Take 2 tablets (100 mg total) by mouth 3 (three) times daily as needed for moderate pain., Disp: 15 tablet, Rfl: 0 .  zolpidem (AMBIEN) 10 MG tablet, Take 1 tablet (10 mg total) by mouth at bedtime as needed for up to 20  days for sleep. (Patient not taking: Reported on 06/22/2019), Disp: 30 tablet, Rfl: 0  Current Facility-Administered Medications:  .  acyclovir ointment (ZOVIRAX) 5 %, , Topical, Q3H, Sloan Leiter, MD  Review of Systems: Denies appetite changes, fevers, chills, fatigue, unexplained weight changes. Denies hearing loss, neck lumps or masses, mouth sores, ringing in ears or voice changes. Denies cough or wheezing.  Denies shortness of breath. Denies chest pain or palpitations. Denies leg swelling. Denies abdominal distention, blood in stools, constipation, diarrhea, nausea, vomiting, or early satiety. Denies pain with intercourse, dysuria, frequency, hematuria or incontinence. Denies hot flashes, vaginal bleeding or vaginal discharge.   Denies joint pain, back pain or muscle pain/cramps. Denies itching, rash, or wounds. Denies dizziness, headaches, numbness or seizures. Denies swollen lymph nodes or glands, denies easy bruising or bleeding. Denies anxiety, depression, confusion, or decreased concentration.  Physical Exam: BP 104/67 (BP Location: Left Arm, Patient Position: Sitting)   Pulse (!) 54   Temp 98.2 F (36.8 C) (Temporal)   Resp 18   Ht 5\' 6"  (1.676 m)   Wt 180 lb 6 oz (81.8 kg)   SpO2 100%   BMI 29.11 kg/m  General: Alert, oriented, no acute distress. HEENT: Normocephalic, atraumatic, sclera anicteric. Chest: Labored breathing on room air. Abdomen: soft, mildly tender to palpation around incisions.  Normoactive bowel sounds.  No masses or  hepatosplenomegaly appreciated.  Incisions are healing well, covered with Dermabond.  There is some brown-tinged discharge underneath the Dermabond that is peeling off of the supraumbilical incision.  All the Dermabond removed and incisions clean.  No evidence of erythema, induration, exudate, or discharge at any of the incisions.  No fluctuance noted around the supraumbilical incision and note discharge able to be expelled. Extremities: Grossly normal range of motion.  Warm, well perfused.  No edema bilaterally. Skin: No rashes or lesions noted.  Laboratory & Radiologic Studies: None new  Assessment & Plan: Courtney Hamilton is a 59 y.o. woman with high-grade cervical dysplasia status post definitive surgery with negative margins who presents for an incision check.  Patient is overall meeting postoperative milestones.  Incisions clean today and Dermabond removed.  No evidence of infection.  All incisions healing well.  Discussed with the patient signs and symptoms that would be concerning for an infection then discussed call and return precautions.  I will see her in a couple of weeks for postoperative visit and vaginal cuff check.  20 minutes of total time was spent for this patient encounter, including preparation, face-to-face counseling with the patient and coordination of care, and documentation of the encounter.  Jeral Pinch, MD  Division of Gynecologic Oncology  Department of Obstetrics and Gynecology  [facility]

## 2019-07-11 NOTE — Patient Instructions (Signed)
Overall, your incisions look great.  I do not see any obvious infection.  If any of your incisions start looking red, or draining pus, become very tender or there is an odor again, please call the clinic and we will plan to send in an antibiotic.

## 2019-07-17 ENCOUNTER — Other Ambulatory Visit: Payer: Self-pay | Admitting: Obstetrics and Gynecology

## 2019-07-21 NOTE — Progress Notes (Signed)
Gynecologic Oncology Return Clinic Visit  5/18  Reason for Visit: Postoperative follow-up  Treatment History: 4/26: Robotic assisted total laparoscopic hysterectomy with right salpingo-oophorectomy for thickened endometrial lining and cervical stenosis with findings of pyometria at the time of surgery.  Final pathology revealed extensive high-grade squamous intraepithelial lesion, CIN-3, of the cervix, otherwise no hyperplasia or malignancy.  Margins negative.  Interval History: Patient reports overall doing well since surgery.  In general her appetite has been good although decreased at times.  She denies any nausea or emesis.  She denies any fevers or chills.  She reports regular bowel movements although still has some pelvic pressure with bowel movements.  She had similar pressure with urination which has completely resolved.  She denies any vaginal bleeding or discharge.  She endorses occasional burning at her incisions with bending.  Past Medical/Surgical History: Past Medical History:  Diagnosis Date  . Arthritis    right hip  . Bronchitis winter 2018  . Chronic kidney disease    History of   . Hepatitis    NON A NON B  . Herniated disc 10/07/2011   lower back  . HIV (human immunodeficiency virus infection) ([place])   . Hypertension   . Lower GI bleed 05/10/2012   pt denies  . Neuropathy   . Osteoporosis   . Ruptured lumbar disc   . Sciatica   . Substance abuse ([place])    past hsitory  clean more than 20 years   . TB (pulmonary tuberculosis) 1993    exposure, treated   . VIN III (vulvar intraepithelial neoplasia III)     Past Surgical History:  Procedure Laterality Date  . APPENDECTOMY    . BREAST EXCISIONAL BIOPSY Left 2001 per pt   cyst removed   . CHOLECYSTECTOMY    . ECTOPIC PREGNANCY SURGERY    . injections to lower back  09/2017  . LAPAROSCOPY N/A 07/03/2019   Procedure: LAPAROSCOPY DIAGNOSTIC; LYSIS OF ADHESIONS;  Surgeon: Lafonda Mosses, MD;  [facility];  Service: Gynecology;  Laterality: N/A;  . ROBOTIC ASSISTED TOTAL HYSTERECTOMY WITH BILATERAL SALPINGO OOPHERECTOMY N/A 07/03/2019   Procedure: XI ROBOTIC ASSISTED TOTAL HYSTERECTOMY WITH UNILATERAL SALPINGO OOPHORECTOMY;  Surgeon: Lafonda Mosses, MD;  [facility];  Service: Gynecology;  Laterality: N/A;  . SALIVARY GLAND SURGERY    . VULVECTOMY N/A 01/18/2018   Procedure: PARTIAL VULVECTOMY;  Surgeon: [name], MD;  Location: [facility];  Service: Gynecology;  Laterality: N/A;    Family History  Problem Relation Age of Onset  . Asthma Father   . COPD Father   . Cancer Mother   . Diabetes Brother   . Hypertension Brother   . Diabetes Brother   . Breast cancer Sister   . Colon cancer Maternal Uncle     Social History   Socioeconomic History  . Marital status: Married    Spouse name: Not on file  . Number of children: Not on file  . Years of education: Not on file  . Highest education level: Not on file  Occupational History  . Not on file  Tobacco Use  . Smoking status: Former Smoker    Packs/day: 0.50    Years: 30.00    Pack years: 15.00    Types: Cigarettes  . Smokeless tobacco: Never Used  . Tobacco comment: quit march 2018  Substance and Sexual Activity  . Alcohol use: No  . Drug use: Yes    Types: Marijuana  Comment: past history of cocaine  and alcohol  none in 20 years  . Sexual activity: Yes    Partners: Male    Comment: CONDOMS GIVEN  Other Topics Concern  . Not on file  Social History Narrative  . Not on file   Social Determinants of Health   Financial Resource Strain:   . Difficulty of Paying Living Expenses:   Food Insecurity:   . Worried About Charity fundraiser in the Last Year:   . Arboriculturist in the Last Year:   Transportation Needs:   . Film/video editor (Medical):   Marland Kitchen Lack of Transportation (Non-Medical):   Physical Activity:   . Days of Exercise per Week:   . Minutes of Exercise  per Session:   Stress:   . Feeling of Stress :   Social Connections:   . Frequency of Communication with Friends and Family:   . Frequency of Social Gatherings with Friends and Family:   . Attends Religious Services:   . Active Member of Clubs or Organizations:   . Attends Archivist Meetings:   Marland Kitchen Marital Status:     Current Medications:  Current Outpatient Medications:  .  acyclovir ointment (ZOVIRAX) 5 %, Apply 1 application topically every 3 (three) hours. (Patient taking differently: Apply 1 application topically every 3 (three) hours as needed (irritation.). ), Disp: 30 g, Rfl: 0 .  Ascorbic Acid (VITAMIN C) 1000 MG tablet, Take 1,000 mg by mouth at bedtime., Disp: , Rfl:  .  Biotin 1000 MCG tablet, Take 2,000 mcg by mouth daily in the afternoon., Disp: , Rfl:  .  DESCOVY 200-25 MG tablet, TAKE 1 TABLET BY MOUTH DAILY., Disp: 30 tablet, Rfl: 5 .  gabapentin (NEURONTIN) 300 MG capsule, TAKE 1 CAPSULE BY MOUTH THREE TIMES A DAY (Patient taking differently: Take 600 mg by mouth at bedtime. ), Disp: 270 capsule, Rfl: 1 .  ibuprofen (ADVIL) 600 MG tablet, Take 1 tablet (600 mg total) by mouth every 6 (six) hours as needed. (Patient taking differently: Take 600 mg by mouth every 6 (six) hours as needed (cramps). ), Disp: 30 tablet, Rfl: 1 .  losartan (COZAAR) 25 MG tablet, Take 1 tablet (25 mg total) by mouth daily. (Patient taking differently: Take 25 mg by mouth daily in the afternoon. ), Disp: 30 tablet, Rfl: 11 .  Multiple Minerals-Vitamins (CAL-MAG ZINC II PO), Take 1 tablet by mouth daily in the afternoon., Disp: , Rfl:  .  Omega-3 Fatty Acids (FISH OIL PO), Take 2,000 mg by mouth daily as needed (hot flashes.)., Disp: , Rfl:  .  oxyCODONE (OXY IR/ROXICODONE) 5 MG immediate release tablet, Take 1 tablet (5 mg total) by mouth every 4 (four) hours as needed for severe pain. For SEVERE pain, do not take with tramadol, do not take and drive, Disp: 10 tablet, Rfl: 0 .   oxyCODONE-acetaminophen (PERCOCET/ROXICET) 5-325 MG tablet, Take 1 tablet by mouth every 6 (six) hours as needed., Disp: 10 tablet, Rfl: 0 .  TIVICAY 50 MG tablet, TAKE 1 TABLET (50 MG TOTAL) BY MOUTH DAILY., Disp: 30 tablet, Rfl: 5 .  traMADol (ULTRAM) 50 MG tablet, Take 2 tablets (100 mg total) by mouth 3 (three) times daily as needed for moderate pain., Disp: 15 tablet, Rfl: 0 .  zolpidem (AMBIEN) 10 MG tablet, Take 1 tablet (10 mg total) by mouth at bedtime as needed for up to 20 days for sleep. (Patient not taking: Reported on 06/22/2019), Disp:  30 tablet, Rfl: 0  Current Facility-Administered Medications:  .  acyclovir ointment (ZOVIRAX) 5 %, , Topical, Q3H, Sloan Leiter, MD  Review of Systems: + pelvic pain Denies appetite changes, fevers, chills, fatigue, unexplained weight changes. Denies hearing loss, neck lumps or masses, mouth sores, ringing in ears or voice changes. Denies cough or wheezing.  Denies shortness of breath. Denies chest pain or palpitations. Denies leg swelling. Denies abdominal distention, pain, blood in stools, constipation, diarrhea, nausea, vomiting, or early satiety. Denies pain with intercourse, dysuria, frequency, hematuria or incontinence. Denies hot flashes, vaginal bleeding or vaginal discharge.   Denies joint pain, back pain or muscle pain/cramps. Denies itching, rash, or wounds. Denies dizziness, headaches, numbness or seizures. Denies swollen lymph nodes or glands, denies easy bruising or bleeding. Denies anxiety, depression, confusion, or decreased concentration.  Physical Exam: BP 128/78 (BP Location: Right Arm, Patient Position: Sitting)   Pulse (!) 58   Temp 98.6 F (37 C) (Oral)   Resp 16   Ht 5\' 6"  (1.676 m)   Wt 176 lb (79.8 kg)   SpO2 100%   BMI 28.41 kg/m  General: Alert, oriented, no acute distress. HEENT: Normocephalic, atraumatic, sclera anicteric. Chest: Unlabored breathing on room air. Abdomen: soft, nontender.  Normoactive  bowel sounds.  No masses or hepatosplenomegaly appreciated.  Well-healed laparoscopic incision. Extremities: Grossly normal range of motion.  Warm, well perfused.  No edema bilaterally. GU: Normal appearing external genitalia without erythema, excoriation, or lesions.  Speculum exam reveals cuff intact, some suture still visible. No bleeding or discharge.  Bimanual exam reveals cuff intact, no fluctuance.  Laboratory & Radiologic Studies: None new  Assessment & Plan: Courtney Hamilton is a 59 y.o. woman approximately 3 weeks status post robotic hysterectomy and right salpingo-oophorectomy for thickened endometrial lining and inability to sample with incidental finding of high-grade cervical dysplasia.  Patient is overall healing well from surgery.  We discussed continued activity restrictions until she is 6-8 weeks out from surgery.  Reviewed again final pathology and need given her high-grade dysplasia as well as her HIV positive status for continued close surveillance given risk of recurrence of HPV related dysplasia at the vaginal cuff.  Per ASCCP guidelines, the patient should continue to have lifetime surveillance in the setting of her HIV positivity and high-grade dysplasia.  I would recommend either cytology or cotesting in 6 months and again in 1 year.  If both negative, then can continue with either cytology or cotesting yearly.  20 minutes of total time was spent for this patient encounter, including preparation, face-to-face counseling with the patient and coordination of care, and documentation of the encounter.  Jeral Pinch, MD  Division of Gynecologic Oncology  Department of Obstetrics and Gynecology  [facility]

## 2019-07-25 ENCOUNTER — Inpatient Hospital Stay (HOSPITAL_BASED_OUTPATIENT_CLINIC_OR_DEPARTMENT_OTHER): Payer: Medicare HMO | Admitting: Gynecologic Oncology

## 2019-07-25 ENCOUNTER — Other Ambulatory Visit: Payer: Self-pay

## 2019-07-25 ENCOUNTER — Encounter: Payer: Self-pay | Admitting: Gynecologic Oncology

## 2019-07-25 VITALS — BP 128/78 | HR 58 | Temp 98.6°F | Resp 16 | Ht 66.0 in | Wt 176.0 lb

## 2019-07-25 DIAGNOSIS — D069 Carcinoma in situ of cervix, unspecified: Secondary | ICD-10-CM

## 2019-07-25 DIAGNOSIS — Z90710 Acquired absence of both cervix and uterus: Secondary | ICD-10-CM

## 2019-07-25 DIAGNOSIS — D071 Carcinoma in situ of vulva: Secondary | ICD-10-CM

## 2019-07-25 DIAGNOSIS — R9389 Abnormal findings on diagnostic imaging of other specified body structures: Secondary | ICD-10-CM

## 2019-07-25 DIAGNOSIS — Z90722 Acquired absence of ovaries, bilateral: Secondary | ICD-10-CM

## 2019-07-25 NOTE — Patient Instructions (Signed)
You are healing well from surgery!  Remember no heavy lifting or pushing for another 3-4 weeks and nothing in the vagina until you are 8 weeks out from surgery.  If you have any issues as you continue in your recovery, please do not hesitate to call at [phone].  I will let Dr. Rosana Hoes know that you are healing well from surgery.  I am recommending that you have repeat Pap testing in 6 months and 12 months.  If both these tests are normal, then I think you can move to yearly testing.

## 2019-07-26 MED FILL — DESCOVY 200-25 MG TABS: 200-25 | 30 days supply | Qty: 30 | Fill #4

## 2019-07-26 MED FILL — TIVICAY 50 MG TABLET: 50 | 30 days supply | Qty: 30 | Fill #4

## 2019-07-27 ENCOUNTER — Telehealth: Payer: Self-pay

## 2019-07-27 DIAGNOSIS — G8918 Other acute postprocedural pain: Secondary | ICD-10-CM

## 2019-07-27 MED ORDER — IBUPROFEN 600 MG PO TABS: 600.0000 mg | ORAL_TABLET | Freq: Four times a day (QID) | ORAL | 0 refills | Status: DC | PRN

## 2019-07-27 NOTE — Telephone Encounter (Signed)
Courtney Hamilton states that the pain in her lower abdomen and vagina has increased since her visit and internal exam with Dr. Berline Lopes on 07-25-19. It hurts when is bears down to have a BM and with her first void in the morning. The pain is a constant cramping in her lower abdomen and in vagina. The pain is a 10/10. There is no vaginal bleeding or discharge. She has taken a tramadol and 2 aleve today with no relief. She would like a refill on her Ibuprofen 600 mg q 6 hr prn. Reviewed with Dr. Berline Lopes. Told Courtney Hamilton that Dr. Berline Lopes is sending in a refill of her ibuprofen. This office will follow up with her mid morning tomorrow and she how she is doing.  If the pain has not improved, Dr. Berline Lopes will need to order a scan to evaluate the pain. Pt verbalized understanding.

## 2019-07-28 NOTE — Telephone Encounter (Signed)
Ms Besel states that the Ibuprofen 600 mg has helped the cramping,  Her pain was a 6/10 a couple of hours ago and took more Ibuprofen and currently pain level 1/10. Recommended that she alternate the ibuprofen with Tylenol 500 mg 1-2 tabs q 6 hrs. Dr. Berline Lopes is pleased that her pain is better with the ibuprofen and will see how she is on Monday 07-31-18. She will not order a Scan at this time with pain improving. Pt to call if pain increases over the weekend or go to the ED if pain not tolerable to be evaluated. Pt verbalized understanding.

## 2019-07-31 NOTE — Telephone Encounter (Signed)
Courtney Hamilton states that she is feeling better then last week.  Her abdominal pain is a 0-03/10/08. She will take the Ibuprofen if her pain becomes a 4/10. Courtney Frohn feels  That she over did activities last week. Reviewed with Dr. Berline Lopes. Pt to call back if pain increases.

## 2019-08-11 ENCOUNTER — Telehealth: Payer: Self-pay

## 2019-08-11 NOTE — Telephone Encounter (Signed)
Courtney Hamilton states that she began experiencing increased lower abdominal pressure yesterday.   She has not moved her bowels for 2 days and moved them 2 days ago  She denies  straining.  The stool was hard and she began with the lower abdominal pressure again.  She used ibuprofen yesterday with good effect. Currently her pressure is a 7/10. She did move her bowels yesterday and they were softer. Has not taken any ibuprofen today. Afebrile. No vaginal or rectal bleeding noted.  Reviewed with Dr. Berline Lopes. Keep using Ibuprofen since it helped decrease the pressure. Add Miralax 1/2-1 capful bid to keep BM daily and soft. She is to call next week if the if the pressure does not improve and Dr. Berline Lopes will order a scan to evaluate pressure. Pt verbalized understanding.

## 2019-08-13 ENCOUNTER — Other Ambulatory Visit: Payer: Self-pay | Admitting: Gynecologic Oncology

## 2019-08-13 DIAGNOSIS — G8918 Other acute postprocedural pain: Secondary | ICD-10-CM

## 2019-08-15 ENCOUNTER — Other Ambulatory Visit: Payer: Self-pay | Admitting: Gynecologic Oncology

## 2019-08-15 ENCOUNTER — Telehealth: Payer: Self-pay

## 2019-08-15 DIAGNOSIS — R1031 Right lower quadrant pain: Secondary | ICD-10-CM

## 2019-08-15 DIAGNOSIS — R1030 Lower abdominal pain, unspecified: Secondary | ICD-10-CM

## 2019-08-15 NOTE — Telephone Encounter (Signed)
Ms Courtney Hamilton states that She continues with lower abdominal pain.  The pain has been more on the right side in the last 2 days. The ibuprofen is helping.  The pain can be a 7/10 prior to taking ibuprofen. Currently the pain is a 3/10. She would like to proceed with a CT scan to evaluate the pain as Dr. Berline Lopes stated would be the next step if pain continued.

## 2019-08-15 NOTE — Telephone Encounter (Signed)
[name] notified that the CT scan of A/P is scheduled for 08-22-19 at 0800 with arrival at [facility] radiology at [place]. Reviewed contrast instructions. She will have labs on Thursday 08-17-19 and P/U oral contrast from the Lab. Contrast to be brought to the lab Thursday am by [facility]. Pt verbalixed understanding.

## 2019-08-16 ENCOUNTER — Telehealth: Payer: Self-pay | Admitting: Gynecologic Oncology

## 2019-08-16 NOTE — Telephone Encounter (Signed)
Release K.O:[id number] Faxed medical records to [facility]

## 2019-08-17 ENCOUNTER — Inpatient Hospital Stay: Payer: Medicare HMO | Attending: Gynecologic Oncology

## 2019-08-17 ENCOUNTER — Other Ambulatory Visit: Payer: Self-pay

## 2019-08-17 DIAGNOSIS — R1030 Lower abdominal pain, unspecified: Secondary | ICD-10-CM | POA: Diagnosis present

## 2019-08-17 LAB — BASIC METABOLIC PANEL
Anion gap: 6 (ref 5–15)
BUN: 12 mg/dL (ref 8–23)
CO2: 27 mmol/L (ref 22–32)
Calcium: 9.9 mg/dL (ref 8.9–10.3)
Chloride: 106 mmol/L (ref 98–111)
Creatinine, Ser: 1.09 mg/dL — ABNORMAL HIGH (ref 0.44–1.00)
GFR calc Af Amer: >60 mL/min (ref >60)
GFR calc non Af Amer: 55 mL/min — ABNORMAL LOW (ref >60)
Glucose, Bld: 86 mg/dL (ref 70–99)
Potassium: 3.9 mmol/L (ref 3.5–5.1)
Sodium: 139 mmol/L (ref 135–145)

## 2019-08-21 ENCOUNTER — Telehealth: Payer: Self-pay

## 2019-08-21 NOTE — Telephone Encounter (Signed)
Patient called with c/o all over abdominal pain.  She thinks it is an infection.  Patient has not taken any ibuprofen since Friday.  Instead she started taking an antibiotic (cefalexin) that she's had since 2017 (ok to take per sister who is a Marine scientist).  Patient says once she took the ABT her pain subsided. Patient wants MD to send in a antibiotic for her. She reports pain level at a 6 today.  Noted in chart that patient is scheduled to have a CT tomorrow. Sending info to NP for advice.

## 2019-08-22 ENCOUNTER — Other Ambulatory Visit: Payer: Self-pay

## 2019-08-22 ENCOUNTER — Telehealth: Payer: Self-pay

## 2019-08-22 ENCOUNTER — Ambulatory Visit (HOSPITAL_COMMUNITY)
Admission: RE | Admit: 2019-08-22 | Discharge: 2019-08-22 | Disposition: A | Discharge status: Home / Self Care | Payer: Medicare HMO | Source: Ambulatory Visit | Attending: Gynecologic Oncology | Admitting: Gynecologic Oncology

## 2019-08-22 DIAGNOSIS — R1030 Lower abdominal pain, unspecified: Secondary | ICD-10-CM | POA: Insufficient documentation

## 2019-08-22 MED ORDER — SODIUM CHLORIDE (PF) 0.9 % IJ SOLN
INTRAMUSCULAR | Status: AC
  Filled 2019-08-22: qty 50

## 2019-08-22 MED ORDER — IOHEXOL 300 MG/ML  SOLN
100.0000 mL | Freq: Once | INTRAMUSCULAR | Status: AC | PRN
  Administered 2019-08-22: 100 mL via INTRAVENOUS

## 2019-08-22 NOTE — Telephone Encounter (Signed)
Please let the patient know that the CT is normal - no fluid collection, abscess or issue with her bowels. please let the PCP know this too - again unclear what is being treated and also that she needs to stop taking the abxs Per Dr. Berline Lopes.

## 2019-08-22 NOTE — Telephone Encounter (Signed)
Attempted to reach Ms Pritts to discuss the result and recommendations as noted below by Dr. Berline Lopes. No answer and her voicemail is full.

## 2019-08-23 NOTE — Telephone Encounter (Signed)
Received office note from [facility] today from visit on 08-21-19. Pt being treated for possible UTI with Keflex 500 mg tid x 10 days. Pt to follow up in 2 weeks with Docia Barrier PA-C

## 2019-08-24 ENCOUNTER — Other Ambulatory Visit: Payer: Self-pay

## 2019-08-28 MED FILL — TIVICAY 50 MG TABLET: 50 | 30 days supply | Qty: 30 | Fill #5

## 2019-08-28 MED FILL — DESCOVY 200-25 MG TABS: 200-25 | 30 days supply | Qty: 30 | Fill #5

## 2019-09-07 ENCOUNTER — Other Ambulatory Visit: Payer: Self-pay

## 2019-09-07 DIAGNOSIS — Z21 Asymptomatic human immunodeficiency virus [HIV] infection status: Secondary | ICD-10-CM

## 2019-09-07 DIAGNOSIS — Z79899 Other long term (current) drug therapy: Secondary | ICD-10-CM

## 2019-09-13 ENCOUNTER — Other Ambulatory Visit: Payer: Medicare HMO

## 2019-09-20 ENCOUNTER — Other Ambulatory Visit: Payer: Self-pay | Admitting: Internal Medicine

## 2019-09-20 DIAGNOSIS — Z21 Asymptomatic human immunodeficiency virus [HIV] infection status: Secondary | ICD-10-CM

## 2019-09-27 MED FILL — TIVICAY 50 MG TABLET: 50 | 30 days supply | Qty: 30 | Fill #0

## 2019-09-27 MED FILL — DESCOVY 200-25 MG TABS: 200-25 | 30 days supply | Qty: 30 | Fill #0

## 2019-10-04 ENCOUNTER — Encounter: Payer: Medicare HMO | Admitting: Internal Medicine

## 2019-10-25 MED FILL — TIVICAY 50 MG TABLET: 50 | 30 days supply | Qty: 30 | Fill #1

## 2019-10-25 MED FILL — DESCOVY 200-25 MG TABS: 200-25 | 30 days supply | Qty: 30 | Fill #1

## 2019-11-20 ENCOUNTER — Other Ambulatory Visit: Payer: Medicare HMO

## 2019-11-20 ENCOUNTER — Other Ambulatory Visit: Payer: Self-pay

## 2019-11-20 DIAGNOSIS — Z21 Asymptomatic human immunodeficiency virus [HIV] infection status: Secondary | ICD-10-CM

## 2019-11-20 DIAGNOSIS — Z79899 Other long term (current) drug therapy: Secondary | ICD-10-CM

## 2019-11-21 LAB — T-HELPER CELL (CD4) - (RCID CLINIC ONLY)
CD4 % Helper T Cell: 16 % — ABNORMAL LOW (ref 33–65)
CD4 T Cell Abs: 369 /uL — ABNORMAL LOW (ref 400–1790)

## 2019-11-22 LAB — COMPLETE METABOLIC PANEL WITH GFR
AG Ratio: 1.2 (calc) (ref 1.0–2.5)
ALT: 10 U/L (ref 6–29)
AST: 18 U/L (ref 10–35)
Albumin: 4.2 g/dL (ref 3.6–5.1)
Alkaline phosphatase (APISO): 68 U/L (ref 37–153)
BUN: 11 mg/dL (ref 7–25)
CO2: 29 mmol/L (ref 20–32)
Calcium: 9.4 mg/dL (ref 8.6–10.4)
Chloride: 102 mmol/L (ref 98–110)
Creat: 0.94 mg/dL (ref 0.50–0.99)
GFR, Est African American: 76 mL/min/{1.73_m2} (ref >=60)
GFR, Est Non African American: 65 mL/min/{1.73_m2} (ref >=60)
Globulin: 3.4 g/dL (calc) (ref 1.9–3.7)
Glucose, Bld: 137 mg/dL — ABNORMAL HIGH (ref 65–99)
Potassium: 4.2 mmol/L (ref 3.5–5.3)
Sodium: 138 mmol/L (ref 135–146)
Total Bilirubin: 0.5 mg/dL (ref 0.2–1.2)
Total Protein: 7.6 g/dL (ref 6.1–8.1)

## 2019-11-22 LAB — CBC WITH DIFFERENTIAL/PLATELET
Absolute Monocytes: 672 cells/uL (ref 200–950)
Basophils Absolute: 64 cells/uL (ref 0–200)
Basophils Relative: 0.8 %
Eosinophils Absolute: 576 cells/uL — ABNORMAL HIGH (ref 15–500)
Eosinophils Relative: 7.2 %
HCT: 42.9 % (ref 35.0–45.0)
Hemoglobin: 13.8 g/dL (ref 11.7–15.5)
Lymphs Abs: 2576 cells/uL (ref 850–3900)
MCH: 28.1 pg (ref 27.0–33.0)
MCHC: 32.2 g/dL (ref 32.0–36.0)
MCV: 87.4 fL (ref 80.0–100.0)
MPV: 9.9 fL (ref 7.5–12.5)
Monocytes Relative: 8.4 %
Neutro Abs: 4112 cells/uL (ref 1500–7800)
Neutrophils Relative %: 51.4 %
Platelets: 319 10*3/uL (ref 140–400)
RBC: 4.91 10*6/uL (ref 3.80–5.10)
RDW: 12.3 % (ref 11.0–15.0)
Total Lymphocyte: 32.2 %
WBC: 8 10*3/uL (ref 3.8–10.8)

## 2019-11-22 LAB — HIV-1 RNA QUANT-NO REFLEX-BLD
HIV 1 RNA Quant: <20 Copies/mL
HIV-1 RNA Quant, Log: <1.3 Log cps/mL

## 2019-11-22 LAB — LIPID PANEL
Cholesterol: 233 mg/dL — ABNORMAL HIGH (ref <200)
HDL: 59 mg/dL (ref >=50)
LDL Cholesterol (Calc): 147 mg/dL (calc) — ABNORMAL HIGH
Non-HDL Cholesterol (Calc): 174 mg/dL (calc) — ABNORMAL HIGH (ref <130)
Total CHOL/HDL Ratio: 3.9 (calc) (ref <5.0)
Triglycerides: 147 mg/dL (ref <150)

## 2019-11-29 MED FILL — DESCOVY 200-25 MG TABS: 200-25 | 30 days supply | Qty: 30 | Fill #2

## 2019-11-29 MED FILL — TIVICAY 50 MG TABLET: 50 | 30 days supply | Qty: 30 | Fill #2

## 2019-12-04 ENCOUNTER — Encounter: Payer: Medicare HMO | Admitting: Internal Medicine

## 2019-12-26 ENCOUNTER — Ambulatory Visit (INDEPENDENT_AMBULATORY_CARE_PROVIDER_SITE_OTHER): Payer: Medicare HMO | Admitting: Internal Medicine

## 2019-12-26 ENCOUNTER — Other Ambulatory Visit: Payer: Self-pay

## 2019-12-26 ENCOUNTER — Encounter: Payer: Self-pay | Admitting: Internal Medicine

## 2019-12-26 VITALS — BP 152/89 | HR 52 | Temp 98.0°F | Wt 179.0 lb

## 2019-12-26 DIAGNOSIS — Z23 Encounter for immunization: Secondary | ICD-10-CM | POA: Diagnosis not present

## 2019-12-26 DIAGNOSIS — Z79899 Other long term (current) drug therapy: Secondary | ICD-10-CM | POA: Diagnosis not present

## 2019-12-26 DIAGNOSIS — B20 Human immunodeficiency virus [HIV] disease: Secondary | ICD-10-CM | POA: Diagnosis not present

## 2019-12-26 DIAGNOSIS — Z780 Asymptomatic menopausal state: Secondary | ICD-10-CM

## 2019-12-26 DIAGNOSIS — Z21 Asymptomatic human immunodeficiency virus [HIV] infection status: Secondary | ICD-10-CM

## 2019-12-26 NOTE — Progress Notes (Signed)
RFV: follow up for hiv disease  Patient ID: Courtney Hamilton, female   DOB: 1960-05-27, 59 y.o.   MRN: [id number]  HPI  58yo F with hiv disease, on tivicay/descovy, CD 4 count of 369/VL<20 (sept 2021). Reports symptoms of hot flashes  [place] chamomille/dong quai- tea that she takes 3 times per day. , also fish oil helps with hot flashes.   Balancing out to be the person to be. 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 Her father is going to move in with her.  Sochx: no smoking and drinking. Faithful.  Had bronchitis roughly 3 wks ago, treated and tested negative for covid  Had pfizer vaccine through = pfizer dose ER [id number]   05/20/19 , [id number]    06/20/19  Outpatient Encounter Medications as of 12/26/2019  Medication Sig   acyclovir ointment (ZOVIRAX) 5 % Apply 1 application topically every 3 (three) hours. (Patient taking differently: Apply 1 application topically every 3 (three) hours as needed (irritation.). )   Ascorbic Acid (VITAMIN C) 1000 MG tablet Take 1,000 mg by mouth at bedtime.   Biotin 1000 MCG tablet Take 2,000 mcg by mouth daily in the afternoon.   DESCOVY 200-25 MG tablet TAKE 1 TABLET BY MOUTH DAILY.   gabapentin (NEURONTIN) 300 MG capsule TAKE 1 CAPSULE BY MOUTH THREE TIMES A DAY (Patient taking differently: Take 600 mg by mouth at bedtime. )   ibuprofen (ADVIL) 600 MG tablet Take 1 tablet (600 mg total) by mouth every 6 (six) hours as needed. (Patient taking differently: Take 600 mg by mouth every 6 (six) hours as needed (cramps). )   ibuprofen (ADVIL) 600 MG tablet TAKE 1 TABLET BY MOUTH EVERY 6 HOURS AS NEEDED   losartan (COZAAR) 25 MG tablet Take 1 tablet (25 mg total) by mouth daily. (Patient taking differently: Take 25 mg by mouth daily in the afternoon. )   Multiple Minerals-Vitamins (CAL-MAG ZINC II PO) Take 1 tablet by mouth daily in the afternoon.   Omega-3 Fatty Acids (FISH OIL PO)  Take 2,000 mg by mouth daily as needed (hot flashes.).   TIVICAY 50 MG tablet TAKE 1 TABLET (50 MG TOTAL) BY MOUTH DAILY.   traMADol (ULTRAM) 50 MG tablet Take 2 tablets (100 mg total) by mouth 3 (three) times daily as needed for moderate pain.   cephALEXin (KEFLEX) 500 MG capsule Take 500 mg by mouth 3 (three) times daily. (Patient not taking: Reported on 12/26/2019)   oxyCODONE (OXY IR/ROXICODONE) 5 MG immediate release tablet Take 1 tablet (5 mg total) by mouth every 4 (four) hours as needed for severe pain. For SEVERE pain, do not take with tramadol, do not take and drive (Patient not taking: Reported on 12/26/2019)   oxyCODONE-acetaminophen (PERCOCET/ROXICET) 5-325 MG tablet Take 1 tablet by mouth every 6 (six) hours as needed. (Patient not taking: Reported on 12/26/2019)   zolpidem (AMBIEN) 10 MG tablet Take 1 tablet (10 mg total) by mouth at bedtime as needed for up to 20 days for sleep. (Patient not taking: Reported on 06/22/2019)   Facility-Administered Encounter Medications as of 12/26/2019  Medication   acyclovir ointment (ZOVIRAX) 5 %     Patient Active Problem List   Diagnosis Date Noted   Carcinoma in situ of cervix 07/11/2019   Thickened endometrium    Cervical stenosis (uterine cervix)    Vulvar intraepithelial neoplasia (VIN) grade 3    Essential hypertension 08/05/2017  Fungal infection of skin of abdomen 06/16/2017   Flu-like symptoms 05/25/2016   Head injury 11/05/2015   Insomnia 11/05/2015   Former cigarette smoker 11/05/2015   Chronic low back pain without sciatica 02/18/2015   Fall 01/14/2015   Lower abdominal pain 10/08/2014   Generalized anxiety disorder 06/04/2014   Anxiety state 01/07/2014   Panic attacks 12/22/2013   Lumbosacral spondylosis without myelopathy 09/29/2013   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   Lumbar paraspinal muscle spasm 01/16/2013   Reflux 12/13/2011   Lower leg edema 12/10/2011   Herpes  simplex 10/07/2011   Peripheral neuropathy 10/07/2011   Hypercholesteremia 10/07/2011   History of tuberculosis exposure 10/01/2011   Recurrent UTI 08/06/2011   HIV positive ([place]) 08/03/2011   Sleep disorder 08/03/2011   Back pain 07/21/2011   Osteoporosis 03/09/2008     Health Maintenance Due  Topic Date Due   COVID-19 Vaccine (1) Never done   TETANUS/TDAP  05/22/2017   INFLUENZA VACCINE  10/08/2019     Review of Systems Hot flashes, feels alittle better, less menopausal symptoms Physical Exam   Wt 179 lb (81.2 kg)    BMI 28.89 kg/m   Physical Exam  Constitutional:  oriented to person, place, and time. appears well-developed and well-nourished. No distress.  HENT: [place]/AT, PERRLA, no scleral icterus Mouth/Throat: Oropharynx is clear and moist. No oropharyngeal exudate.  Cardiovascular: Normal rate, regular rhythm and normal heart sounds. Exam reveals no gallop and no friction rub.  No murmur heard.  Pulmonary/Chest: Effort normal and breath sounds normal. No respiratory distress.  has no wheezes.  Neck = supple, no nuchal rigidity Lymphadenopathy: no cervical adenopathy. No axillary adenopathy Neurological: alert and oriented to person, place, and time.  Skin: Skin is warm and dry. No rash noted. No erythema.  Psychiatric: a normal mood and affect.  behavior is normal.   Lab Results  Component Value Date   CD4TCELL 16 (L) 11/20/2019   Lab Results  Component Value Date   CD4TABS 369 (L) 11/20/2019   CD4TABS 494 03/15/2019   CD4TABS 370 (L) 04/04/2018   Lab Results  Component Value Date   HIV1RNAQUANT <20 11/20/2019   Lab Results  Component Value Date   HEPBSAB REACTIVE (A) 05/18/2017   Lab Results  Component Value Date   LABRPR NON-REACTIVE 04/04/2018    CBC Lab Results  Component Value Date   WBC 8.0 11/20/2019   RBC 4.91 11/20/2019   HGB 13.8 11/20/2019   HCT 42.9 11/20/2019   PLT 319 11/20/2019   MCV 87.4 11/20/2019   MCH 28.1  11/20/2019   MCHC 32.2 11/20/2019   RDW 12.3 11/20/2019   LYMPHSABS 2,576 11/20/2019   MONOABS 1,304 (H) 06/01/2016   EOSABS 576 (H) 11/20/2019    BMET Lab Results  Component Value Date   NA 138 11/20/2019   K 4.2 11/20/2019   CL 102 11/20/2019   CO2 29 11/20/2019   GLUCOSE 137 (H) 11/20/2019   BUN 11 11/20/2019   CREATININE 0.94 11/20/2019   CALCIUM 9.4 11/20/2019   GFRNONAA 65 11/20/2019   GFRAA 76 11/20/2019    Assessment and Plan  hiv disease = well controlled, continue with tivicay/descovy  Menopause = will need to review her OTC medications to ensure it does not interact with medications  Long term medication management = cr stable  Health miantenance = will give flu shot; set up pfizer booster shot here; mammo is done  Needs colonoscopy due in 2019.Marland Kitchen  Long term medication  management= cr stable

## 2020-01-01 MED FILL — TIVICAY 50 MG TABLET: 50 | 30 days supply | Qty: 30 | Fill #3

## 2020-01-01 MED FILL — DESCOVY 200-25 MG TABS: 200-25 | 30 days supply | Qty: 30 | Fill #3

## 2020-01-05 ENCOUNTER — Ambulatory Visit: Payer: Medicare HMO

## 2020-01-17 ENCOUNTER — Other Ambulatory Visit: Payer: Self-pay

## 2020-01-17 ENCOUNTER — Encounter: Payer: Self-pay | Admitting: Obstetrics and Gynecology

## 2020-01-17 ENCOUNTER — Ambulatory Visit (INDEPENDENT_AMBULATORY_CARE_PROVIDER_SITE_OTHER): Payer: Medicare HMO | Admitting: Obstetrics and Gynecology

## 2020-01-17 VITALS — BP 144/88 | HR 58 | Wt 182.0 lb

## 2020-01-17 DIAGNOSIS — N951 Menopausal and female climacteric states: Secondary | ICD-10-CM | POA: Diagnosis not present

## 2020-01-17 DIAGNOSIS — L292 Pruritus vulvae: Secondary | ICD-10-CM | POA: Diagnosis not present

## 2020-01-17 DIAGNOSIS — R300 Dysuria: Secondary | ICD-10-CM

## 2020-01-17 LAB — POCT URINALYSIS DIPSTICK
Appearance: NEGATIVE
Bilirubin, UA: NEGATIVE
Blood, UA: NEGATIVE
Glucose, UA: NEGATIVE
Ketones, UA: NEGATIVE
Leukocytes, UA: NEGATIVE
Nitrite, UA: NEGATIVE
Protein, UA: NEGATIVE
Spec Grav, UA: 1.015 (ref 1.010–1.025)
Urobilinogen, UA: 0.2 E.U./dL
pH, UA: 7 (ref 5.0–8.0)

## 2020-01-17 NOTE — Patient Instructions (Signed)
Look for Remifemin at your local pharmacy. This is a non-prescription herbal remedy called black cohosh that may improve your menopausal symptoms. Please read the inserts as black cohosh can interfere with other medications and medical conditions. Please call if you have any questions before taking. This may take several weeks/months to show an improvement. If this herbal remedy does not improve your symptoms, please let me know, and we will discuss other options.

## 2020-01-17 NOTE — Progress Notes (Signed)
Pt c/o vulva itching x 1 week Denies abnormal vagina discharge  Pt also reports mood swings, sweating, hot flashes, nausea, pelvic pain, and dysuria while voiding every since total hyst 06/2019 Normal pap 09/24/2017  CT abdomen pelvis 08/22/19 Mammogram 06/07/19

## 2020-01-17 NOTE — Progress Notes (Signed)
GYNECOLOGY OFFICE VISIT NOTE  History:  59 y.o. [id number] here today for pain in clitoral and rectal areas. Worsening over the last few weeks. Has tried vaseline with no improvement. Tried clotrimazole with no improvement. Also with significant hot flushes since hysterectomy. States mood swings, hot flushes, that are affecting her life.    Past Medical History:  Diagnosis Date  . Arthritis    right hip  . Bronchitis winter 2018  . Chronic kidney disease    History of   . Hepatitis    NON A NON B  . Herniated disc 10/07/2011   lower back  . HIV (human immunodeficiency virus infection) ([place])   . Hypertension   . Lower GI bleed 05/10/2012   pt denies  . Neuropathy   . Osteoporosis   . Ruptured lumbar disc   . Sciatica   . Substance abuse ([place])    past hsitory  clean more than 20 years   . TB (pulmonary tuberculosis) 1993    exposure, treated   . VIN III (vulvar intraepithelial neoplasia III)     Past Surgical History:  Procedure Laterality Date  . APPENDECTOMY    . BREAST EXCISIONAL BIOPSY Left 2001 per pt   cyst removed   . CHOLECYSTECTOMY    . ECTOPIC PREGNANCY SURGERY    . injections to lower back  09/2017  . LAPAROSCOPY N/A 07/03/2019   Procedure: LAPAROSCOPY DIAGNOSTIC; LYSIS OF ADHESIONS;  Surgeon: Lafonda Mosses, MD;  Location: [facility];  Service: Gynecology;  Laterality: N/A;  . ROBOTIC ASSISTED TOTAL HYSTERECTOMY WITH BILATERAL SALPINGO OOPHERECTOMY N/A 07/03/2019   Procedure: XI ROBOTIC ASSISTED TOTAL HYSTERECTOMY WITH UNILATERAL SALPINGO OOPHORECTOMY;  Surgeon: Lafonda Mosses, MD;  Location: [facility];  Service: Gynecology;  Laterality: N/A;  . SALIVARY GLAND SURGERY    . VULVECTOMY N/A 01/18/2018   Procedure: PARTIAL VULVECTOMY;  Surgeon: [name], MD;  Location: [facility];  Service: Gynecology;  Laterality: N/A;     Current Outpatient Medications:  .  acyclovir ointment (ZOVIRAX) 5 %, Apply 1 application topically every  3 (three) hours. (Patient taking differently: Apply 1 application topically every 3 (three) hours as needed (irritation.). ), Disp: 30 g, Rfl: 0 .  Ascorbic Acid (VITAMIN C) 1000 MG tablet, Take 1,000 mg by mouth at bedtime., Disp: , Rfl:  .  Biotin 1000 MCG tablet, Take 2,000 mcg by mouth daily in the afternoon., Disp: , Rfl:  .  DESCOVY 200-25 MG tablet, TAKE 1 TABLET BY MOUTH DAILY., Disp: 30 tablet, Rfl: 5 .  gabapentin (NEURONTIN) 300 MG capsule, TAKE 1 CAPSULE BY MOUTH THREE TIMES A DAY (Patient taking differently: Take 600 mg by mouth at bedtime. ), Disp: 270 capsule, Rfl: 1 .  losartan (COZAAR) 25 MG tablet, Take 1 tablet (25 mg total) by mouth daily., Disp: 30 tablet, Rfl: 11 .  Multiple Minerals-Vitamins (CAL-MAG ZINC II PO), Take 1 tablet by mouth daily in the afternoon., Disp: , Rfl:  .  Omega-3 Fatty Acids (FISH OIL PO), Take 2,000 mg by mouth daily as needed (hot flashes.)., Disp: , Rfl:  .  TIVICAY 50 MG tablet, TAKE 1 TABLET (50 MG TOTAL) BY MOUTH DAILY., Disp: 30 tablet, Rfl: 5 .  traMADol (ULTRAM) 50 MG tablet, Take 2 tablets (100 mg total) by mouth 3 (three) times daily as needed for moderate pain., Disp: 15 tablet, Rfl: 0 .  zolpidem (AMBIEN) 10 MG tablet, Take 1 tablet (10 mg total) by mouth at bedtime as  needed for up to 20 days for sleep. (Patient not taking: Reported on 06/22/2019), Disp: 30 tablet, Rfl: 0  Current Facility-Administered Medications:  .  acyclovir ointment (ZOVIRAX) 5 %, , Topical, Q3H, Sloan Leiter, MD  The following portions of the patient's history were reviewed and updated as appropriate: allergies, current medications, past family history, past medical history, past social history, past surgical history and problem list.   Review of Systems:  Pertinent items noted in HPI and remainder of comprehensive ROS otherwise negative.   Objective:  Physical Exam BP (!) 144/88   Pulse (!) 58   Wt 182 lb (82.6 kg)   BMI 29.38 kg/m  CONSTITUTIONAL:  Well-developed, well-nourished female in no acute distress.  HENT:  Normocephalic, atraumatic. External right and left ear normal. Oropharynx is clear and moist EYES: Conjunctivae and EOM are normal. Pupils are equal, round, and reactive to light. No scleral icterus.  NECK: Normal range of motion, supple, no masses SKIN: Skin is warm and dry. No rash noted. Not diaphoretic. No erythema. No pallor. NEUROLOGIC: Alert and oriented to person, place, and time. Normal reflexes, muscle tone coordination. No cranial nerve deficit noted. PSYCHIATRIC: Normal mood and affect. Normal behavior. Normal judgment and thought content. CARDIOVASCULAR: Normal heart rate noted RESPIRATORY: Effort  normal, no problems with respiration noted ABDOMEN: Soft, no distention noted.   PELVIC: Normal appearing external genitalia hypopigmented areas. Notable for mildly ulcerated 2 mm areas on clitoris and clitoral hood; rectal area with shallow ulceration 360 degrees around anus. MUSCULOSKELETAL: Normal range of motion. No edema noted.  Exam done with chaperone present.  Labs and Imaging No results found.  Assessment & Plan:  1. Dysuria Urine culture today - POCT Urinalysis Dipstick  2. Vulvar itching - Patient with history of VIN3 s/o vulvectomy with new ulcerations and raw skin at clitoris and rectum - needs biopsy, clitoral area very difficult to biopsy in office effectively due to proximity to clitoris and pain, offered to biopsy peri-rectal but also recommend that she will likely need to see Gyn Onc, she declines biopsy in office today - will call to see Gyn Onc for same  3. Menopausal symptoms Reviewed options for management including expectant management, layering, OTC black cohosh (reviewed interactions with current meds, none noted), paroxiteine and HRT. Reviewed risks/benefits of both, she would liek to start with remifemin  4. Hot flushes, perimenopausal    Routine preventative health maintenance  measures emphasized. Please refer to After Visit Summary for other counseling recommendations.   Return if symptoms worsen or fail to improve.   Total face-to-face time with patient: 32 minutes. Over 50% of encounter was spent on counseling and coordination of care.   Feliz Beam, M.D. Attending Center for [employer] [facility])

## 2020-01-18 ENCOUNTER — Telehealth: Payer: Self-pay | Admitting: *Deleted

## 2020-01-18 NOTE — Telephone Encounter (Signed)
Patient called and stated "I saw Dr Rosana Hoes yesterday; she saw new places that need to be biopsied. She said to call Dr Berline Lopes for this."  Patient scheduled for an appt on 11/16 at 10:30am

## 2020-01-20 LAB — URINE CULTURE

## 2020-01-22 ENCOUNTER — Encounter: Payer: Self-pay | Admitting: Gynecologic Oncology

## 2020-01-22 NOTE — Progress Notes (Signed)
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 On 01/18/18, she underwent WLE of two right vulvar lesions; final pathology revealed VIN3. Posterior lesion had a positive lateral margin.   4/26: Robotic assisted total laparoscopic hysterectomy with right salpingo-oophorectomy for thickened endometrial lining and cervical stenosis with findings of pyometria at the time of surgery.  Final pathology revealed extensive high-grade squamous intraepithelial lesion, CIN-3, of the cervix, otherwise no hyperplasia or malignancy.  Margins negative.  Interval History: Seen for follow-up in her gynecologist office for periclitoral and anal pruritus and pain that had worsened over the last several weeks.  Also was having significant hot flashes and mood swings since surgery.  Given proximity of ulcerations to the clitoris, biopsy was deferred until she was seen in our office.    Reports 2 weeks of pruritus near her clitoris.  Also endorses burning near her anus.  She used hemorrhoid cream thinking this may be related to hemorrhoid, with no help.  She notes some small amount of spotting from the perianal area.  She notes that since surgery she has had intermittent nausea that seems unrelated to what she eats or when she has last eaten.  This happens about twice a day and lasts for 5 minutes.  She had an episode of emesis this weekend but generally denies emesis.  She denies any associated right upper quadrant pain.  She also has felt some pain when she empties her bladder and describes this as "deep" and as if "something is dropping".  She denies having to push or strain to empty her bladder.  She has been drinking more water and thus urinating more frequently than she had been.  Her [place]  They  are planning to move there and the patient's father is going to move in with them.  Past Medical/Surgical History: Past Medical History:  Diagnosis Date  . Arthritis    right hip  . Bronchitis winter 2018  . Chronic kidney disease    History of   . Hepatitis    NON A NON B  . Herniated disc 10/07/2011   lower back  . HIV (human immunodeficiency virus infection) ([place])   . Hypertension   . Lower GI bleed 05/10/2012   pt denies  . Neuropathy   . Osteoporosis   . Ruptured lumbar disc   . Sciatica   . Substance abuse ([place])    past hsitory  clean more than 20 years   . TB (pulmonary tuberculosis) 1993    exposure, treated   . VIN III (vulvar intraepithelial neoplasia III)     Past Surgical History:  Procedure Laterality Date  . APPENDECTOMY    . BREAST EXCISIONAL BIOPSY Left 2001 per pt   cyst removed   . CHOLECYSTECTOMY    . ECTOPIC PREGNANCY SURGERY    . injections to lower back  09/2017  . LAPAROSCOPY N/A 07/03/2019   Procedure: LAPAROSCOPY DIAGNOSTIC; LYSIS OF ADHESIONS;  Surgeon: Lafonda Mosses, MD;  Location: [facility];  Service: Gynecology;  Laterality: N/A;  . ROBOTIC ASSISTED TOTAL HYSTERECTOMY WITH BILATERAL SALPINGO OOPHERECTOMY N/A 07/03/2019   Procedure: XI ROBOTIC ASSISTED TOTAL HYSTERECTOMY WITH UNILATERAL SALPINGO OOPHORECTOMY;  Surgeon: Lafonda Mosses, MD;  Location: [facility];  Service: Gynecology;  Laterality: N/A;  . SALIVARY GLAND SURGERY    .  VULVECTOMY N/A 01/18/2018   Procedure: PARTIAL VULVECTOMY;  Surgeon: [name], MD;  Location: [facility];  Service: Gynecology;  Laterality: N/A;    Family History  Problem Relation Age of Onset  . Asthma Father   . COPD Father   . Cancer Mother   . Diabetes Brother   . Hypertension Brother   . Diabetes Brother   . Breast cancer Sister   . Colon cancer Maternal Uncle     Social History   Socioeconomic History  . Marital status: Married    Spouse name: Not on file  .  Number of children: Not on file  . Years of education: Not on file  . Highest education level: Not on file  Occupational History  . Not on file  Tobacco Use  . Smoking status: Former Smoker    Packs/day: 0.50    Years: 30.00    Pack years: 15.00    Types: Cigarettes  . Smokeless tobacco: Never Used  . Tobacco comment: quit march 2018  Vaping Use  . Vaping Use: Never used  Substance and Sexual Activity  . Alcohol use: No  . Drug use: Yes    Types: Marijuana    Comment: past history of cocaine  and alcohol  none in 20 years  . Sexual activity: Yes    Partners: Male    Comment: given condoms  Other Topics Concern  . Not on file  Social History Narrative  . Not on file   Social Determinants of Health   Financial Resource Strain:   . Difficulty of Paying Living Expenses: Not on file  Food Insecurity:   . Worried About Charity fundraiser in the Last Year: Not on file  . Ran Out of Food in the Last Year: Not on file  Transportation Needs:   . Lack of Transportation (Medical): Not on file  . Lack of Transportation (Non-Medical): Not on file  Physical Activity:   . Days of Exercise per Week: Not on file  . Minutes of Exercise per Session: Not on file  Stress:   . Feeling of Stress : Not on file  Social Connections:   . Frequency of Communication with Friends and Family: Not on file  . Frequency of Social Gatherings with Friends and Family: Not on file  . Attends Religious Services: Not on file  . Active Member of Clubs or Organizations: Not on file  . Attends Archivist Meetings: Not on file  . Marital Status: Not on file    Current Medications:  Current Outpatient Medications:  .  acyclovir ointment (ZOVIRAX) 5 %, Apply 1 application topically every 3 (three) hours. (Patient taking differently: Apply 1 application topically every 3 (three) hours as needed (irritation.). ), Disp: 30 g, Rfl: 0 .  Ascorbic Acid (VITAMIN C) 1000 MG tablet, Take 1,000 mg by mouth  at bedtime., Disp: , Rfl:  .  Biotin 1000 MCG tablet, Take 2,000 mcg by mouth daily in the afternoon., Disp: , Rfl:  .  DESCOVY 200-25 MG tablet, TAKE 1 TABLET BY MOUTH DAILY., Disp: 30 tablet, Rfl: 5 .  gabapentin (NEURONTIN) 300 MG capsule, TAKE 1 CAPSULE BY MOUTH THREE TIMES A DAY (Patient taking differently: Take 600 mg by mouth at bedtime. ), Disp: 270 capsule, Rfl: 1 .  losartan (COZAAR) 25 MG tablet, Take 1 tablet (25 mg total) by mouth daily., Disp: 30 tablet, Rfl: 11 .  losartan (COZAAR) 50 MG tablet, Take 50 mg by mouth  daily., Disp: , Rfl:  .  Multiple Minerals-Vitamins (CAL-MAG ZINC II PO), Take 1 tablet by mouth daily in the afternoon., Disp: , Rfl:  .  Omega-3 Fatty Acids (FISH OIL PO), Take 2,000 mg by mouth daily as needed (hot flashes.)., Disp: , Rfl:  .  TIVICAY 50 MG tablet, TAKE 1 TABLET (50 MG TOTAL) BY MOUTH DAILY., Disp: 30 tablet, Rfl: 5 .  traMADol (ULTRAM) 50 MG tablet, Take 2 tablets (100 mg total) by mouth 3 (three) times daily as needed for moderate pain., Disp: 15 tablet, Rfl: 0 .  zolpidem (AMBIEN) 10 MG tablet, Take 1 tablet (10 mg total) by mouth at bedtime as needed for up to 20 days for sleep., Disp: 30 tablet, Rfl: 0  Current Facility-Administered Medications:  .  acyclovir ointment (ZOVIRAX) 5 %, , Topical, Q3H, Sloan Leiter, MD  Review of Systems: + Appetite changes, fatigue, nausea, emesis, dysuria, hot flashes, pelvic pain, back pain, pruritus. Denies fevers, chills, unexplained weight changes. Denies hearing loss, neck lumps or masses, mouth sores, ringing in ears or voice changes. Denies cough or wheezing.  Denies shortness of breath. Denies chest pain or palpitations. Denies leg swelling. Denies abdominal distention, pain, blood in stools, constipation, diarrhea, or early satiety. Denies pain with intercourse, frequency, hematuria or incontinence. Denies pelvic pain, vaginal bleeding or vaginal discharge.   Denies joint pain or muscle  pain/cramps. Denies rash, or wounds. Denies dizziness, headaches, numbness or seizures. Denies swollen lymph nodes or glands, denies easy bruising or bleeding. Denies anxiety, depression, confusion, or decreased concentration.  Physical Exam: BP (!) 155/82 (BP Location: Left Arm, Patient Position: Sitting)   Pulse (!) 57   Temp (!) 97.5 F (36.4 C) (Tympanic)   Resp 20   Wt 180 lb 9.6 oz (81.9 kg)   SpO2 100%   BMI 29.15 kg/m  General: Alert, oriented, no acute distress. HEENT: Normocephalic, atraumatic, sclera anicteric. Chest: Unlabored breathing on room air. Abdomen: soft, mild tenderness with deep palpation in the left lower quadrant.  Normoactive bowel sounds.  No masses or hepatosplenomegaly appreciated.  Well-healed incisions. Extremities: Grossly normal range of motion.  Warm, well perfused.  No edema bilaterally. Lymphatics: No inguinal adenopathy. GU: External genitalia notable for several hypopigmented spots on the patient's vulva.  There is a small, several millimeter, ulcerated appearing lesion on the clitoris itself and 2 similar sized lesions on the clitoral hood.  The clitoral hood looks and feels slightly inflamed.  The patient is tender with palpation over this area.  Perirectal, especially along the posterior aspect, there is some leukoplasia, and an additional ulcerated area measuring approximately 5 mm that is concerning for dysplasia.  Speculum exam reveals mildly atrophic vaginal mucosa, no lesions or masses.  Pap test and HPV collected.  Some anterior prolapse noted.  Laboratory & Radiologic Studies: None new  Assessment & Plan: Courtney Hamilton is a 59 y.o. woman with history of both high-grade vulvar and cervical dysplasia, most recently who underwent hysterectomy for inability to sample the uterine lining with final pathology revealing CIN-3, who presents with new clitoral and perianal lesions.  The patient has had significant symptoms related to these lesions  and is very nervous about biopsy today.  Additionally, she is picking up her grandson children after she leaves, and prefers to avoid a biopsy and worsening of her pain if possible.  We discussed that the small ulcerated lesions on her clitoris and the hood of her clitoris may be a dysplasia but also could  be herpes vesicles that have been unroofed.  The patient has a history of herpes and normally gets lesions on the right labia and vulva.  Her symptoms are somewhat different than symptoms she has had in the past.  I offered that we could swab the lesions today given that they are open and testing for herpes.  In terms of the perianal lesions, these look consistent with dysplastic changes.  Patient similarly wanted to avoid biopsy today.  I offered that we could plan for an outpatient procedure under some light anesthesia to perform biopsies.  As long as no concern for invasive cancer, dysplasia could be treated with laser therapy at this time.  Will plan for EUA, possible vulvar and peri-anal biopsies, possible laser ablation, and any other indicated procedures. Peri-operative instructions were reviewed. Risks were reviewed including but not limited to bleeding, damage to surrounding structures, DVT/PE, medical complications (stroke, pneumonia, cardiac arrest).  In the setting of her recent history of high-grade cervical dysplasia status post hysterectomy, cotesting performed today.  I will contact her with these results.  The patient is overdue for colonoscopy.  Especially in the setting of her GI symptoms, I encouraged her to call [facility] to get this scheduled.  35 minutes of total time was spent for this patient encounter, including preparation, face-to-face counseling with the patient and coordination of care, and documentation of the encounter.  Jeral Pinch, MD  Division of Gynecologic Oncology  Department of Obstetrics and Gynecology  [facility]

## 2020-01-22 NOTE — H&P (View-Only) (Signed)
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 On 01/18/18, she underwent WLE of two right vulvar lesions; final pathology revealed VIN3. Posterior lesion had a positive lateral margin.   4/26: Robotic assisted total laparoscopic hysterectomy with right salpingo-oophorectomy for thickened endometrial lining and cervical stenosis with findings of pyometria at the time of surgery.  Final pathology revealed extensive high-grade squamous intraepithelial lesion, CIN-3, of the cervix, otherwise no hyperplasia or malignancy.  Margins negative.  Interval History: Seen for follow-up in her gynecologist office for periclitoral and anal pruritus and pain that had worsened over the last several weeks.  Also was having significant hot flashes and mood swings since surgery.  Given proximity of ulcerations to the clitoris, biopsy was deferred until she was seen in our office.    Reports 2 weeks of pruritus near her clitoris.  Also endorses burning near her anus.  She used hemorrhoid cream thinking this may be related to hemorrhoid, with no help.  She notes some small amount of spotting from the perianal area.  She notes that since surgery she has had intermittent nausea that seems unrelated to what she eats or when she has last eaten.  This happens about twice a day and lasts for 5 minutes.  She had an episode of emesis this weekend but generally denies emesis.  She denies any associated right upper quadrant pain.  She also has felt some pain when she empties her bladder and describes this as "deep" and as if "something is dropping".  She denies having to push or strain to empty her bladder.  She has been drinking more water and thus urinating more frequently than she had been.  Her [place] recently [place] country[place]  They  are planning to move there and the patient's father is going to move in with them.  Past Medical/Surgical History: Past Medical History:  Diagnosis Date  . Arthritis    right hip  . Bronchitis winter 2018  . Chronic kidney disease    History of   . Hepatitis    NON A NON B  . Herniated disc 10/07/2011   lower back  . HIV (human immunodeficiency virus infection) ([place])   . Hypertension   . Lower GI bleed 05/10/2012   pt denies  . Neuropathy   . Osteoporosis   . Ruptured lumbar disc   . Sciatica   . Substance abuse ([place])    past hsitory  clean more than 20 years   . TB (pulmonary tuberculosis) 1993    exposure, treated   . VIN III (vulvar intraepithelial neoplasia III)     Past Surgical History:  Procedure Laterality Date  . APPENDECTOMY    . BREAST EXCISIONAL BIOPSY Left 2001 per pt   cyst removed   . CHOLECYSTECTOMY    . ECTOPIC PREGNANCY SURGERY    . injections to lower back  09/2017  . LAPAROSCOPY N/A 07/03/2019   Procedure: LAPAROSCOPY DIAGNOSTIC; LYSIS OF ADHESIONS;  Surgeon: Lafonda Mosses, MD;  Location: [facility];  Service: Gynecology;  Laterality: N/A;  . ROBOTIC ASSISTED TOTAL HYSTERECTOMY WITH BILATERAL SALPINGO OOPHERECTOMY N/A 07/03/2019   Procedure: XI ROBOTIC ASSISTED TOTAL HYSTERECTOMY WITH UNILATERAL SALPINGO OOPHORECTOMY;  Surgeon: Lafonda Mosses, MD;  Location: [facility];  Service: Gynecology;  Laterality: N/A;  . SALIVARY GLAND SURGERY    .  VULVECTOMY N/A 01/18/2018   Procedure: PARTIAL VULVECTOMY;  Surgeon: [name], MD;  Location: [facility];  Service: Gynecology;  Laterality: N/A;    Family History  Problem Relation Age of Onset  . Asthma Father   . COPD Father   . Cancer Mother   . Diabetes Brother   . Hypertension Brother   . Diabetes Brother   . Breast cancer Sister   . Colon cancer Maternal Uncle     Social History   Socioeconomic History  . Marital status: Married    Spouse name: Not on file  .  Number of children: Not on file  . Years of education: Not on file  . Highest education level: Not on file  Occupational History  . Not on file  Tobacco Use  . Smoking status: Former Smoker    Packs/day: 0.50    Years: 30.00    Pack years: 15.00    Types: Cigarettes  . Smokeless tobacco: Never Used  . Tobacco comment: quit march 2018  Vaping Use  . Vaping Use: Never used  Substance and Sexual Activity  . Alcohol use: No  . Drug use: Yes    Types: Marijuana    Comment: past history of cocaine  and alcohol  none in 20 years  . Sexual activity: Yes    Partners: Male    Comment: given condoms  Other Topics Concern  . Not on file  Social History Narrative  . Not on file   Social Determinants of Health   Financial Resource Strain:   . Difficulty of Paying Living Expenses: Not on file  Food Insecurity:   . Worried About Charity fundraiser in the Last Year: Not on file  . Ran Out of Food in the Last Year: Not on file  Transportation Needs:   . Lack of Transportation (Medical): Not on file  . Lack of Transportation (Non-Medical): Not on file  Physical Activity:   . Days of Exercise per Week: Not on file  . Minutes of Exercise per Session: Not on file  Stress:   . Feeling of Stress : Not on file  Social Connections:   . Frequency of Communication with Friends and Family: Not on file  . Frequency of Social Gatherings with Friends and Family: Not on file  . Attends Religious Services: Not on file  . Active Member of Clubs or Organizations: Not on file  . Attends Archivist Meetings: Not on file  . Marital Status: Not on file    Current Medications:  Current Outpatient Medications:  .  acyclovir ointment (ZOVIRAX) 5 %, Apply 1 application topically every 3 (three) hours. (Patient taking differently: Apply 1 application topically every 3 (three) hours as needed (irritation.). ), Disp: 30 g, Rfl: 0 .  Ascorbic Acid (VITAMIN C) 1000 MG tablet, Take 1,000 mg by mouth  at bedtime., Disp: , Rfl:  .  Biotin 1000 MCG tablet, Take 2,000 mcg by mouth daily in the afternoon., Disp: , Rfl:  .  DESCOVY 200-25 MG tablet, TAKE 1 TABLET BY MOUTH DAILY., Disp: 30 tablet, Rfl: 5 .  gabapentin (NEURONTIN) 300 MG capsule, TAKE 1 CAPSULE BY MOUTH THREE TIMES A DAY (Patient taking differently: Take 600 mg by mouth at bedtime. ), Disp: 270 capsule, Rfl: 1 .  losartan (COZAAR) 25 MG tablet, Take 1 tablet (25 mg total) by mouth daily., Disp: 30 tablet, Rfl: 11 .  losartan (COZAAR) 50 MG tablet, Take 50 mg by mouth  daily., Disp: , Rfl:  .  Multiple Minerals-Vitamins (CAL-MAG ZINC II PO), Take 1 tablet by mouth daily in the afternoon., Disp: , Rfl:  .  Omega-3 Fatty Acids (FISH OIL PO), Take 2,000 mg by mouth daily as needed (hot flashes.)., Disp: , Rfl:  .  TIVICAY 50 MG tablet, TAKE 1 TABLET (50 MG TOTAL) BY MOUTH DAILY., Disp: 30 tablet, Rfl: 5 .  traMADol (ULTRAM) 50 MG tablet, Take 2 tablets (100 mg total) by mouth 3 (three) times daily as needed for moderate pain., Disp: 15 tablet, Rfl: 0 .  zolpidem (AMBIEN) 10 MG tablet, Take 1 tablet (10 mg total) by mouth at bedtime as needed for up to 20 days for sleep., Disp: 30 tablet, Rfl: 0  Current Facility-Administered Medications:  .  acyclovir ointment (ZOVIRAX) 5 %, , Topical, Q3H, Sloan Leiter, MD  Review of Systems: + Appetite changes, fatigue, nausea, emesis, dysuria, hot flashes, pelvic pain, back pain, pruritus. Denies fevers, chills, unexplained weight changes. Denies hearing loss, neck lumps or masses, mouth sores, ringing in ears or voice changes. Denies cough or wheezing.  Denies shortness of breath. Denies chest pain or palpitations. Denies leg swelling. Denies abdominal distention, pain, blood in stools, constipation, diarrhea, or early satiety. Denies pain with intercourse, frequency, hematuria or incontinence. Denies pelvic pain, vaginal bleeding or vaginal discharge.   Denies joint pain or muscle  pain/cramps. Denies rash, or wounds. Denies dizziness, headaches, numbness or seizures. Denies swollen lymph nodes or glands, denies easy bruising or bleeding. Denies anxiety, depression, confusion, or decreased concentration.  Physical Exam: BP (!) 155/82 (BP Location: Left Arm, Patient Position: Sitting)   Pulse (!) 57   Temp (!) 97.5 F (36.4 C) (Tympanic)   Resp 20   Wt 180 lb 9.6 oz (81.9 kg)   SpO2 100%   BMI 29.15 kg/m  General: Alert, oriented, no acute distress. HEENT: Normocephalic, atraumatic, sclera anicteric. Chest: Unlabored breathing on room air. Abdomen: soft, mild tenderness with deep palpation in the left lower quadrant.  Normoactive bowel sounds.  No masses or hepatosplenomegaly appreciated.  Well-healed incisions. Extremities: Grossly normal range of motion.  Warm, well perfused.  No edema bilaterally. Lymphatics: No inguinal adenopathy. GU: External genitalia notable for several hypopigmented spots on the patient's vulva.  There is a small, several millimeter, ulcerated appearing lesion on the clitoris itself and 2 similar sized lesions on the clitoral hood.  The clitoral hood looks and feels slightly inflamed.  The patient is tender with palpation over this area.  Perirectal, especially along the posterior aspect, there is some leukoplasia, and an additional ulcerated area measuring approximately 5 mm that is concerning for dysplasia.  Speculum exam reveals mildly atrophic vaginal mucosa, no lesions or masses.  Pap test and HPV collected.  Some anterior prolapse noted.  Laboratory & Radiologic Studies: None new  Assessment & Plan: Courtney Hamilton is a 59 y.o. woman with history of both high-grade vulvar and cervical dysplasia, most recently who underwent hysterectomy for inability to sample the uterine lining with final pathology revealing CIN-3, who presents with new clitoral and perianal lesions.  The patient has had significant symptoms related to these lesions  and is very nervous about biopsy today.  Additionally, she is picking up her grandson children after she leaves, and prefers to avoid a biopsy and worsening of her pain if possible.  We discussed that the small ulcerated lesions on her clitoris and the hood of her clitoris may be a dysplasia but also could  be herpes vesicles that have been unroofed.  The patient has a history of herpes and normally gets lesions on the right labia and vulva.  Her symptoms are somewhat different than symptoms she has had in the past.  I offered that we could swab the lesions today given that they are open and testing for herpes.  In terms of the perianal lesions, these look consistent with dysplastic changes.  Patient similarly wanted to avoid biopsy today.  I offered that we could plan for an outpatient procedure under some light anesthesia to perform biopsies.  As long as no concern for invasive cancer, dysplasia could be treated with laser therapy at this time.  Will plan for EUA, possible vulvar and peri-anal biopsies, possible laser ablation, and any other indicated procedures. Peri-operative instructions were reviewed. Risks were reviewed including but not limited to bleeding, damage to surrounding structures, DVT/PE, medical complications (stroke, pneumonia, cardiac arrest).  In the setting of her recent history of high-grade cervical dysplasia status post hysterectomy, cotesting performed today.  I will contact her with these results.  The patient is overdue for colonoscopy.  Especially in the setting of her GI symptoms, I encouraged her to call [facility] to get this scheduled.  35 minutes of total time was spent for this patient encounter, including preparation, face-to-face counseling with the patient and coordination of care, and documentation of the encounter.  Jeral Pinch, MD  Division of Gynecologic Oncology  Department of Obstetrics and Gynecology  [facility]

## 2020-01-23 ENCOUNTER — Other Ambulatory Visit: Payer: Self-pay

## 2020-01-23 ENCOUNTER — Other Ambulatory Visit: Payer: Self-pay | Admitting: Gynecologic Oncology

## 2020-01-23 ENCOUNTER — Inpatient Hospital Stay: Payer: Medicare HMO | Attending: Gynecologic Oncology | Admitting: Gynecologic Oncology

## 2020-01-23 ENCOUNTER — Encounter: Payer: Self-pay | Admitting: Gynecologic Oncology

## 2020-01-23 ENCOUNTER — Other Ambulatory Visit (HOSPITAL_COMMUNITY)
Admission: RE | Admit: 2020-01-23 | Discharge: 2020-01-23 | Disposition: A | Discharge status: Home / Self Care | Payer: Medicare HMO | Source: Ambulatory Visit | Attending: Gynecologic Oncology | Admitting: Gynecologic Oncology

## 2020-01-23 VITALS — BP 155/82 | HR 57 | Temp 97.5°F | Resp 20 | Wt 180.6 lb

## 2020-01-23 DIAGNOSIS — D069 Carcinoma in situ of cervix, unspecified: Secondary | ICD-10-CM | POA: Diagnosis present

## 2020-01-23 DIAGNOSIS — Z79899 Other long term (current) drug therapy: Secondary | ICD-10-CM | POA: Insufficient documentation

## 2020-01-23 DIAGNOSIS — N189 Chronic kidney disease, unspecified: Secondary | ICD-10-CM | POA: Diagnosis not present

## 2020-01-23 DIAGNOSIS — B009 Herpesviral infection, unspecified: Secondary | ICD-10-CM | POA: Diagnosis not present

## 2020-01-23 DIAGNOSIS — K759 Inflammatory liver disease, unspecified: Secondary | ICD-10-CM | POA: Diagnosis not present

## 2020-01-23 DIAGNOSIS — Z87891 Personal history of nicotine dependence: Secondary | ICD-10-CM | POA: Diagnosis not present

## 2020-01-23 DIAGNOSIS — Z90721 Acquired absence of ovaries, unilateral: Secondary | ICD-10-CM | POA: Diagnosis not present

## 2020-01-23 DIAGNOSIS — M810 Age-related osteoporosis without current pathological fracture: Secondary | ICD-10-CM | POA: Diagnosis not present

## 2020-01-23 DIAGNOSIS — D071 Carcinoma in situ of vulva: Secondary | ICD-10-CM | POA: Insufficient documentation

## 2020-01-23 DIAGNOSIS — B20 Human immunodeficiency virus [HIV] disease: Secondary | ICD-10-CM | POA: Insufficient documentation

## 2020-01-23 DIAGNOSIS — Z124 Encounter for screening for malignant neoplasm of cervix: Secondary | ICD-10-CM | POA: Insufficient documentation

## 2020-01-23 DIAGNOSIS — Z90710 Acquired absence of both cervix and uterus: Secondary | ICD-10-CM | POA: Insufficient documentation

## 2020-01-23 DIAGNOSIS — I129 Hypertensive chronic kidney disease with stage 1 through stage 4 chronic kidney disease, or unspecified chronic kidney disease: Secondary | ICD-10-CM | POA: Diagnosis not present

## 2020-01-23 MED ORDER — IBUPROFEN 800 MG PO TABS: 800.0000 mg | ORAL_TABLET | Freq: Three times a day (TID) | ORAL | 0 refills | Status: DC | PRN

## 2020-01-23 MED ORDER — OXYCODONE HCL 5 MG PO TABS: 5.0000 mg | ORAL_TABLET | ORAL | 0 refills | Status: DC | PRN

## 2020-01-23 MED ORDER — SENNOSIDES-DOCUSATE SODIUM 8.6-50 MG PO TABS: 2.0000 | ORAL_TABLET | Freq: Every day | ORAL | 0 refills | Status: DC

## 2020-01-23 NOTE — Patient Instructions (Signed)
Preparing for your Surgery  Plan for surgery on February 05, 2020 with Dr. Jeral Pinch at [facility]. You will be scheduled for a examination under anesthesia, vulvar and perianal biopsies, vulvar laser, possible vaginal laser.   We recommend purchasing several bags of frozen green peas and dividing them into ziploc bags. You will want to keep these in the freezer and have them ready to use as ice packs to the vulvar incision. Once the ice pack is no longer cold, you can get another from the freezer. The frozen peas mold to your body better than a regular ice pack.  Pre-operative Testing -You will receive a phone call from presurgical testing at [facility] to arrange for a pre-operative appointment over the phone, labs if needed, and COVID test. The COVID test normally happens 3 days prior to the surgery and they ask that you self quarantine after the test up until surgery to decrease chance of exposure.  -Bring your insurance card, copy of an advanced directive if applicable, medication list     -You should not be taking blood thinners or aspirin at least ten days prior to surgery unless instructed by your surgeon.  -Do not take supplements such as fish oil (omega 3), red yeast rice, turmeric before your surgery.   Day Before Surgery at [facility] will be advised you can have clear liquids up until 3 hours before your surgery.    Your role in recovery Your role is to become active as soon as directed by your doctor, while still giving yourself time to heal.  Rest when you feel tired. You will be asked to do the following in order to speed your recovery:  - Cough and breathe deeply. This helps to clear and expand your lungs and can prevent pneumonia after surgery.  - [place]. Do mild physical activity. Walking or moving your legs help your circulation and body functions return to normal. Do not try to get up or walk alone the  first time after surgery.   -If you develop swelling on one leg or the other, pain in the back of your leg, redness/warmth in one of your legs, please call the office or go to the Emergency Room to have a doppler to rule out a blood clot. For shortness of breath, chest pain-seek care in the Emergency Room as soon as possible. - Actively manage your pain. Managing your pain lets you move in comfort. We will ask you to rate your pain on a scale of zero to 10. It is your responsibility to tell your doctor or nurse where and how much you hurt so your pain can be treated.  Pain Management After Surgery -You have been prescribed your pain medication and bowel regimen medications before surgery so that you can have these available when you are discharged from the hospital. The pain medication is for use ONLY AFTER surgery and a new prescription will not be given.   -Make sure that you have Tylenol and Ibuprofen at home to use on a regular basis after surgery for pain control. We recommend alternating the medications every hour to six hours since they work differently and are processed in the body differently for pain relief.  -Review the attached handout on narcotic use and their risks and side effects.   Bowel Regimen -You have been prescribed Sennakot-S to take nightly to prevent constipation especially if you are taking the narcotic pain medication  intermittently.  It is important to prevent constipation and drink adequate amounts of liquids. You can stop taking this medication when you are not taking pain medication and you are back on your normal bowel routine.  Risks of Surgery Risks of surgery are low but include bleeding, infection, damage to surrounding structures, re-operation, blood clots, and very rarely death.  AFTER SURGERY INSTRUCTIONS  Return to work: 2-4 weeks if applicable  Activity: 1. Be up and out of the bed during the day.  Take a nap if needed.  You may walk up steps but be  careful and use the hand rail.  Stair climbing will tire you more than you think, you may need to stop part way and rest.   2. No lifting or straining for 6 weeks over 10 pounds. No pushing, pulling, straining for 6 weeks.  3. No driving for at least 48 hours after surgery.  Do not drive if you are taking narcotic pain medicine and make sure that your reaction time has returned.   4. You can shower as soon as the next day after surgery. Shower daily.  Use your regular soap and water (not directly on the incision) and pat your incision(s) dry afterwards; don't rub.  No tub baths or submerging your body in water until cleared by your surgeon. If you have the soap that was given to you by pre-surgical testing that was used before surgery, you do not need to use it afterwards because this can irritate your incisions.   5. No sexual activity and nothing in the vagina for 4 weeks.  6. You may experience a small amount of clear drainage from your vulva, which is normal.  If the drainage persists, increases, or changes color please call the office.  7. Take Tylenol or ibuprofen first for pain and only use narcotic pain medication for severe pain not relieved by the Tylenol or Ibuprofen.  Monitor your Tylenol intake to a max of 4,000 mg in a 24 hour period. You can alternate these medications after surgery.  Diet: 1. Low sodium Heart Healthy Diet is recommended but you are cleared to resume your normal (before surgery) diet after your procedure.  2. It is safe to use a laxative, such as Miralax or Colace, if you have difficulty moving your bowels. You have been prescribed Sennakot at bedtime every evening to keep bowel movements regular and to prevent constipation.    Wound Care: 1. Keep clean and dry.  Shower daily.  Reasons to call the Doctor:  Fever - Oral temperature greater than 100.4 degrees Fahrenheit  Foul-smelling vaginal discharge  Difficulty urinating  Nausea and vomiting  Increased  pain at the site of the incision that is unrelieved with pain medicine.  Difficulty breathing with or without chest pain  New calf pain especially if only on one side  Sudden, continuing increased vaginal bleeding with or without clots.   Contacts: For questions or concerns you should contact:  Dr. Jeral Pinch at [phone]  Joylene John, NP at [phone]  After Hours: call [phone] and have the GYN Oncologist paged/contacted (after 5 pm or on the weekends)

## 2020-01-24 ENCOUNTER — Other Ambulatory Visit: Payer: Self-pay

## 2020-01-24 ENCOUNTER — Encounter (HOSPITAL_BASED_OUTPATIENT_CLINIC_OR_DEPARTMENT_OTHER): Payer: Self-pay | Admitting: Gynecologic Oncology

## 2020-01-24 NOTE — Progress Notes (Addendum)
Spoke w/ via phone for pre-op interview---pt Lab needs dos---- none has lab appt 11-30-2019 at 1000 am for cbc, cmet              Lab results------ekg 06-07-2019 COVID test ------02-02-2020 at 1200 pm Arrive at -------1200 pm 02-05-2020 NPO after MN NO Solid Food.  Clear liquids from MN until---1100 am then npo Medications to take morning of surgery -----descovy, tivicay Diabetic medication -----n/a Patient Special Instructions -----none Pre-Op special Istructions -----none Patient verbalized understanding of instructions that were given at this phone interview. Patient denies shortness of breath, chest pain, fever, cough at this phone interview.  lov dr Caren Griffins [facility] infectious diseases 12-26-2019 epic

## 2020-01-27 ENCOUNTER — Other Ambulatory Visit (HOSPITAL_COMMUNITY): Payer: Medicare HMO

## 2020-01-29 LAB — CYTOLOGY - PAP: Diagnosis: NEGATIVE

## 2020-01-30 ENCOUNTER — Other Ambulatory Visit: Payer: Self-pay

## 2020-01-30 ENCOUNTER — Encounter (HOSPITAL_COMMUNITY)
Admission: RE | Admit: 2020-01-30 | Discharge: 2020-01-30 | Disposition: A | Discharge status: Home / Self Care | Payer: Medicare HMO | Source: Ambulatory Visit | Attending: Gynecologic Oncology | Admitting: Gynecologic Oncology

## 2020-01-30 DIAGNOSIS — Z01812 Encounter for preprocedural laboratory examination: Secondary | ICD-10-CM | POA: Diagnosis not present

## 2020-01-30 LAB — CBC
HCT: 42.7 % (ref 36.0–46.0)
Hemoglobin: 13.3 g/dL (ref 12.0–15.0)
MCH: 28.1 pg (ref 26.0–34.0)
MCHC: 31.1 g/dL (ref 30.0–36.0)
MCV: 90.1 fL (ref 80.0–100.0)
Platelets: 287 10*3/uL (ref 150–400)
RBC: 4.74 MIL/uL (ref 3.87–5.11)
RDW: 13.3 % (ref 11.5–15.5)
WBC: 6.7 10*3/uL (ref 4.0–10.5)
nRBC: 0 % (ref 0.0–0.2)

## 2020-01-30 LAB — COMPREHENSIVE METABOLIC PANEL
ALT: 12 U/L (ref 0–44)
AST: 19 U/L (ref 15–41)
Albumin: 4.4 g/dL (ref 3.5–5.0)
Alkaline Phosphatase: 53 U/L (ref 38–126)
Anion gap: 7 (ref 5–15)
BUN: 16 mg/dL (ref 8–23)
CO2: 26 mmol/L (ref 22–32)
Calcium: 9.9 mg/dL (ref 8.9–10.3)
Chloride: 109 mmol/L (ref 98–111)
Creatinine, Ser: 0.83 mg/dL (ref 0.44–1.00)
GFR, Estimated: >60 mL/min (ref >60)
Glucose, Bld: 99 mg/dL (ref 70–99)
Potassium: 3.8 mmol/L (ref 3.5–5.1)
Sodium: 142 mmol/L (ref 135–145)
Total Bilirubin: 0.7 mg/dL (ref 0.3–1.2)
Total Protein: 7.9 g/dL (ref 6.5–8.1)

## 2020-01-31 ENCOUNTER — Telehealth: Payer: Self-pay

## 2020-01-31 LAB — VIRUS CULTURE

## 2020-01-31 NOTE — Telephone Encounter (Signed)
Told Courtney Hamilton that the viral culture done in the office on 01-23-20 showed no viral infection per Joylene John, NP. Pt verbalized understanding.

## 2020-02-02 ENCOUNTER — Telehealth: Payer: Self-pay

## 2020-02-02 ENCOUNTER — Other Ambulatory Visit (HOSPITAL_COMMUNITY): Payer: Medicare HMO

## 2020-02-02 NOTE — Telephone Encounter (Signed)
Presurgical check-in with patient.  Patient verbalized understanding of all pre admission instructions. Patient is aware of her dietary restrictions and knows her arrival time is 12noon.  Patient instructed to bring loose underwear/or loose fitting pants for after surgery.  Covid 19 test rescheduled to tomorrow at 1150am per patient request.  Patient will call with any questions or concerns.

## 2020-02-03 ENCOUNTER — Other Ambulatory Visit (HOSPITAL_COMMUNITY)
Admission: RE | Admit: 2020-02-03 | Discharge: 2020-02-03 | Disposition: A | Discharge status: Home / Self Care | Payer: Medicare HMO | Source: Ambulatory Visit | Attending: Gynecologic Oncology | Admitting: Gynecologic Oncology

## 2020-02-03 DIAGNOSIS — Z01812 Encounter for preprocedural laboratory examination: Secondary | ICD-10-CM | POA: Diagnosis present

## 2020-02-03 DIAGNOSIS — Z20822 Contact with and (suspected) exposure to covid-19: Secondary | ICD-10-CM | POA: Diagnosis not present

## 2020-02-03 MED FILL — DESCOVY 200-25 MG TABS: 200-25 | 30 days supply | Qty: 30 | Fill #4

## 2020-02-03 MED FILL — TIVICAY 50 MG TABLET: 50 | 30 days supply | Qty: 30 | Fill #4

## 2020-02-04 LAB — SARS CORONAVIRUS 2 (TAT 6-24 HRS): SARS Coronavirus 2: NEGATIVE

## 2020-02-05 ENCOUNTER — Ambulatory Visit (HOSPITAL_BASED_OUTPATIENT_CLINIC_OR_DEPARTMENT_OTHER): Payer: Medicare HMO | Admitting: Anesthesiology

## 2020-02-05 ENCOUNTER — Ambulatory Visit (HOSPITAL_BASED_OUTPATIENT_CLINIC_OR_DEPARTMENT_OTHER)
Admission: RE | Admit: 2020-02-05 | Discharge: 2020-02-05 | Disposition: A | Discharge status: Home / Self Care | Payer: Medicare HMO | Attending: Gynecologic Oncology | Admitting: Gynecologic Oncology

## 2020-02-05 ENCOUNTER — Other Ambulatory Visit: Payer: Self-pay | Admitting: Gynecologic Oncology

## 2020-02-05 ENCOUNTER — Encounter (HOSPITAL_BASED_OUTPATIENT_CLINIC_OR_DEPARTMENT_OTHER): Payer: Self-pay | Admitting: Gynecologic Oncology

## 2020-02-05 ENCOUNTER — Encounter (HOSPITAL_BASED_OUTPATIENT_CLINIC_OR_DEPARTMENT_OTHER)
Admission: RE | Disposition: A | Discharge status: Home / Self Care | Payer: Self-pay | Source: Home / Self Care | Attending: Gynecologic Oncology

## 2020-02-05 ENCOUNTER — Other Ambulatory Visit: Payer: Self-pay

## 2020-02-05 DIAGNOSIS — Z79899 Other long term (current) drug therapy: Secondary | ICD-10-CM | POA: Diagnosis not present

## 2020-02-05 DIAGNOSIS — L290 Pruritus ani: Secondary | ICD-10-CM | POA: Insufficient documentation

## 2020-02-05 DIAGNOSIS — Z8759 Personal history of other complications of pregnancy, childbirth and the puerperium: Secondary | ICD-10-CM | POA: Insufficient documentation

## 2020-02-05 DIAGNOSIS — D071 Carcinoma in situ of vulva: Secondary | ICD-10-CM | POA: Diagnosis not present

## 2020-02-05 DIAGNOSIS — K6289 Other specified diseases of anus and rectum: Secondary | ICD-10-CM | POA: Insufficient documentation

## 2020-02-05 DIAGNOSIS — Z21 Asymptomatic human immunodeficiency virus [HIV] infection status: Secondary | ICD-10-CM | POA: Diagnosis not present

## 2020-02-05 DIAGNOSIS — Z9049 Acquired absence of other specified parts of digestive tract: Secondary | ICD-10-CM | POA: Diagnosis not present

## 2020-02-05 DIAGNOSIS — R1030 Lower abdominal pain, unspecified: Secondary | ICD-10-CM

## 2020-02-05 DIAGNOSIS — Z87891 Personal history of nicotine dependence: Secondary | ICD-10-CM | POA: Diagnosis not present

## 2020-02-05 DIAGNOSIS — L859 Epidermal thickening, unspecified: Secondary | ICD-10-CM | POA: Insufficient documentation

## 2020-02-05 DIAGNOSIS — B369 Superficial mycosis, unspecified: Secondary | ICD-10-CM

## 2020-02-05 DIAGNOSIS — Z90722 Acquired absence of ovaries, bilateral: Secondary | ICD-10-CM | POA: Insufficient documentation

## 2020-02-05 DIAGNOSIS — Z809 Family history of malignant neoplasm, unspecified: Secondary | ICD-10-CM | POA: Diagnosis not present

## 2020-02-05 DIAGNOSIS — Z86001 Personal history of in-situ neoplasm of cervix uteri: Secondary | ICD-10-CM | POA: Diagnosis not present

## 2020-02-05 DIAGNOSIS — R3989 Other symptoms and signs involving the genitourinary system: Secondary | ICD-10-CM

## 2020-02-05 HISTORY — PX: CO2 LASER APPLICATION: SHX5778

## 2020-02-05 HISTORY — DX: Personal history of other diseases of urinary system: Z87.448

## 2020-02-05 SURGERY — EXAM UNDER ANESTHESIA
Anesthesia: General | Site: Vagina

## 2020-02-05 MED ORDER — SCOPOLAMINE 1 MG/3DAYS TD PT72: 1.0000 | MEDICATED_PATCH | TRANSDERMAL | Status: DC

## 2020-02-05 MED ORDER — LIDOCAINE 2% (20 MG/ML) 5 ML SYRINGE
INTRAMUSCULAR | Status: DC | PRN
  Administered 2020-02-05: 80 mg via INTRAVENOUS

## 2020-02-05 MED ORDER — SILVER SULFADIAZINE 1 % EX CREA
TOPICAL_CREAM | Freq: Two times a day (BID) | CUTANEOUS | Status: AC
  Administered 2020-02-05: 1 via TOPICAL

## 2020-02-05 MED ORDER — PROPOFOL 10 MG/ML IV BOLUS
INTRAVENOUS | Status: DC | PRN
  Administered 2020-02-05: 170 mg via INTRAVENOUS
  Administered 2020-02-05: 30 mg via INTRAVENOUS

## 2020-02-05 MED ORDER — FENTANYL CITRATE (PF) 100 MCG/2ML IJ SOLN
INTRAMUSCULAR | Status: DC | PRN
  Administered 2020-02-05 (×2): 50 ug via INTRAVENOUS

## 2020-02-05 MED ORDER — SILVER SULFADIAZINE 1 % EX CREA
TOPICAL_CREAM | CUTANEOUS | Status: DC | PRN
  Administered 2020-02-05: 1 via TOPICAL

## 2020-02-05 MED ORDER — MIDAZOLAM HCL 2 MG/2ML IJ SOLN
INTRAMUSCULAR | Status: AC
  Filled 2020-02-05: qty 2

## 2020-02-05 MED ORDER — ONDANSETRON HCL 4 MG/2ML IJ SOLN
INTRAMUSCULAR | Status: AC
  Filled 2020-02-05: qty 2

## 2020-02-05 MED ORDER — KETOROLAC TROMETHAMINE 30 MG/ML IJ SOLN
INTRAMUSCULAR | Status: DC | PRN
  Administered 2020-02-05: 30 mg via INTRAVENOUS

## 2020-02-05 MED ORDER — DEXAMETHASONE SODIUM PHOSPHATE 10 MG/ML IJ SOLN: 4.0000 mg | INTRAMUSCULAR | Status: DC

## 2020-02-05 MED ORDER — FENTANYL CITRATE (PF) 100 MCG/2ML IJ SOLN
INTRAMUSCULAR | Status: AC
  Filled 2020-02-05: qty 2

## 2020-02-05 MED ORDER — SCOPOLAMINE 1 MG/3DAYS TD PT72
1.0000 | MEDICATED_PATCH | TRANSDERMAL | Status: DC
  Administered 2020-02-05: 1.5 mg via TRANSDERMAL

## 2020-02-05 MED ORDER — SCOPOLAMINE 1 MG/3DAYS TD PT72
MEDICATED_PATCH | TRANSDERMAL | Status: AC
  Filled 2020-02-05: qty 1

## 2020-02-05 MED ORDER — EPHEDRINE 5 MG/ML INJ
INTRAVENOUS | Status: AC
  Filled 2020-02-05: qty 10

## 2020-02-05 MED ORDER — LACTATED RINGERS IV SOLN: INTRAVENOUS | Status: DC

## 2020-02-05 MED ORDER — GABAPENTIN 300 MG PO CAPS
300.0000 mg | ORAL_CAPSULE | ORAL | Status: AC
  Administered 2020-02-05: 300 mg via ORAL

## 2020-02-05 MED ORDER — MIDAZOLAM HCL 5 MG/5ML IJ SOLN
INTRAMUSCULAR | Status: DC | PRN
  Administered 2020-02-05: 2 mg via INTRAVENOUS

## 2020-02-05 MED ORDER — AMISULPRIDE (ANTIEMETIC) 5 MG/2ML IV SOLN: 10.0000 mg | Freq: Once | INTRAVENOUS | Status: DC | PRN

## 2020-02-05 MED ORDER — LIDOCAINE HCL 1 % IJ SOLN
INTRAMUSCULAR | Status: DC | PRN
  Administered 2020-02-05: 20 mL

## 2020-02-05 MED ORDER — KETOROLAC TROMETHAMINE 30 MG/ML IJ SOLN
INTRAMUSCULAR | Status: AC
  Filled 2020-02-05: qty 1

## 2020-02-05 MED ORDER — DEXAMETHASONE SODIUM PHOSPHATE 10 MG/ML IJ SOLN
INTRAMUSCULAR | Status: AC
  Filled 2020-02-05: qty 1

## 2020-02-05 MED ORDER — OXYCODONE HCL 5 MG PO TABS: 5.0000 mg | ORAL_TABLET | Freq: Once | ORAL | Status: DC | PRN

## 2020-02-05 MED ORDER — OXYCODONE HCL 5 MG/5ML PO SOLN: 5.0000 mg | Freq: Once | ORAL | Status: DC | PRN

## 2020-02-05 MED ORDER — ONDANSETRON HCL 4 MG/2ML IJ SOLN
INTRAMUSCULAR | Status: DC | PRN
  Administered 2020-02-05: 4 mg via INTRAVENOUS

## 2020-02-05 MED ORDER — GABAPENTIN 300 MG PO CAPS: 300.0000 mg | ORAL_CAPSULE | ORAL | Status: DC

## 2020-02-05 MED ORDER — DEXAMETHASONE SODIUM PHOSPHATE 10 MG/ML IJ SOLN
INTRAMUSCULAR | Status: DC | PRN
  Administered 2020-02-05: 10 mg via INTRAVENOUS

## 2020-02-05 MED ORDER — PROPOFOL 10 MG/ML IV BOLUS
INTRAVENOUS | Status: AC
  Filled 2020-02-05: qty 20

## 2020-02-05 MED ORDER — ACETAMINOPHEN 500 MG PO TABS
1000.0000 mg | ORAL_TABLET | ORAL | Status: AC
  Administered 2020-02-05: 1000 mg via ORAL

## 2020-02-05 MED ORDER — EPHEDRINE SULFATE-NACL 50-0.9 MG/10ML-% IV SOSY
PREFILLED_SYRINGE | INTRAVENOUS | Status: DC | PRN
  Administered 2020-02-05 (×3): 10 mg via INTRAVENOUS

## 2020-02-05 MED ORDER — ACETIC ACID 5 % SOLN
Status: DC | PRN
  Administered 2020-02-05: 1 via TOPICAL

## 2020-02-05 MED ORDER — GABAPENTIN 300 MG PO CAPS
ORAL_CAPSULE | ORAL | Status: AC
  Filled 2020-02-05: qty 1

## 2020-02-05 MED ORDER — ACETAMINOPHEN 500 MG PO TABS: 1000.0000 mg | ORAL_TABLET | ORAL | Status: DC

## 2020-02-05 MED ORDER — FENTANYL CITRATE (PF) 100 MCG/2ML IJ SOLN
25.0000 ug | INTRAMUSCULAR | Status: DC | PRN
  Administered 2020-02-05 (×3): 50 ug via INTRAVENOUS

## 2020-02-05 MED ORDER — ACETAMINOPHEN 500 MG PO TABS
ORAL_TABLET | ORAL | Status: AC
  Filled 2020-02-05: qty 2

## 2020-02-05 MED ORDER — PROMETHAZINE HCL 25 MG/ML IJ SOLN: 6.2500 mg | INTRAMUSCULAR | Status: DC | PRN

## 2020-02-05 SURGICAL SUPPLY — 42 items
BLADE SURG 11 STRL SS (BLADE) IMPLANT
BLADE SURG 15 STRL LF DISP TIS (BLADE) IMPLANT
BLADE SURG 15 STRL SS (BLADE)
CANISTER SUCT 1200ML W/VALVE (MISCELLANEOUS) IMPLANT
CANISTER SUCT 3000ML PPV (MISCELLANEOUS) IMPLANT
CATH ROBINSON RED A/P 14FR (CATHETERS) ×2 IMPLANT
CATH ROBINSON RED A/P 16FR (CATHETERS) IMPLANT
COVER WAND RF STERILE (DRAPES) ×2 IMPLANT
DEPRESSOR TONGUE BLADE STERILE (MISCELLANEOUS) ×4 IMPLANT
DRSG TELFA 3X8 NADH (GAUZE/BANDAGES/DRESSINGS) IMPLANT
ELECT BALL LEEP 3MM BLK (ELECTRODE) IMPLANT
GLOVE BIO SURGEON STRL SZ 6 (GLOVE) ×4 IMPLANT
GLOVE BIO SURGEON STRL SZ7 (GLOVE) ×2 IMPLANT
GLOVE BIOGEL PI IND STRL 7.5 (GLOVE) ×2 IMPLANT
GLOVE BIOGEL PI INDICATOR 7.5 (GLOVE) ×2
GLOVE SURG SS PI 7.0 STRL IVOR (GLOVE) ×2 IMPLANT
GOWN STRL REUS W/TWL LRG LVL3 (GOWN DISPOSABLE) ×4 IMPLANT
KIT TURNOVER CYSTO (KITS) ×2 IMPLANT
NEEDLE HYPO 25X1 1.5 SAFETY (NEEDLE) ×2 IMPLANT
NS IRRIG 500ML POUR BTL (IV SOLUTION) IMPLANT
PACK PERINEAL COLD (PAD) ×2 IMPLANT
PACK VAGINAL WOMENS (CUSTOM PROCEDURE TRAY) ×2 IMPLANT
PAD PREP 24X48 CUFFED NSTRL (MISCELLANEOUS) IMPLANT
PUNCH BIOPSY DERMAL 3 (INSTRUMENTS) IMPLANT
PUNCH BIOPSY DERMAL 3MM (INSTRUMENTS)
PUNCH BIOPSY DERMAL 4MM (INSTRUMENTS) ×2 IMPLANT
SUT VIC AB 0 CT1 36 (SUTURE) IMPLANT
SUT VIC AB 2-0 SH 27 (SUTURE)
SUT VIC AB 2-0 SH 27XBRD (SUTURE) IMPLANT
SUT VIC AB 3-0 PS2 18 (SUTURE)
SUT VIC AB 3-0 PS2 18XBRD (SUTURE) IMPLANT
SUT VIC AB 3-0 SH 27 (SUTURE)
SUT VIC AB 3-0 SH 27X BRD (SUTURE) IMPLANT
SUT VIC AB 4-0 PS2 18 (SUTURE) IMPLANT
SUT VICRYL 0 UR6 27IN ABS (SUTURE) IMPLANT
SWAB OB GYN 8IN STERILE 2PK (MISCELLANEOUS) ×4 IMPLANT
SYR BULB IRRIG 60ML STRL (SYRINGE) ×2 IMPLANT
TOWEL OR 17X26 10 PK STRL BLUE (TOWEL DISPOSABLE) ×2 IMPLANT
TUBE CONNECTING 12X1/4 (SUCTIONS) IMPLANT
VACUUM HOSE 7/8X10 W/ WAND (MISCELLANEOUS) ×2 IMPLANT
VACUUM HOSE/TUBING 7/8INX6FT (MISCELLANEOUS) IMPLANT
WATER STERILE IRR 500ML POUR (IV SOLUTION) IMPLANT

## 2020-02-05 NOTE — Transfer of Care (Signed)
Immediate Anesthesia Transfer of Care Note  Patient: Courtney Hamilton  Procedure(s) Performed: EXAM UNDER ANESTHESIA WITH VULVAR AND PERIANAL BIOPSY (N/A ) CO2 LASER APPLICATION OF VULVA WITH POSSIBLE VAGINAL LASER (N/A )  Patient Location: PACU  Anesthesia Type:General  Level of Consciousness: awake, alert , oriented and patient cooperative  Airway & Oxygen Therapy: Patient Spontanous Breathing  Post-op Assessment: Report given to RN and Post -op Vital signs reviewed and stable  Post vital signs: Reviewed and stable  Last Vitals:  Vitals Value Taken Time  BP 130/80 02/05/20 1445  Temp    Pulse 71 02/05/20 1447  Resp 17 02/05/20 1447  SpO2 97 % 02/05/20 1447  Vitals shown include unvalidated device data.  Last Pain:  Vitals:   02/05/20 1232  TempSrc: Oral         Complications: No complications documented.

## 2020-02-05 NOTE — Anesthesia Preprocedure Evaluation (Addendum)
Anesthesia Evaluation  Patient identified by MRN, date of birth, ID band Patient awake    Reviewed: Allergy & Precautions, NPO status , Patient's Chart, lab work & pertinent test results  History of Anesthesia Complications Negative for: history of anesthetic complications  Airway Mallampati: II  TM Distance: >3 FB Neck ROM: Full    Dental  (+) Dental Advisory Given, Upper Dentures   Pulmonary former smoker,    Pulmonary exam normal breath sounds clear to auscultation       Cardiovascular hypertension, Pt. 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  Reproductive/Obstetrics negative OB ROS                             Anesthesia Physical  Anesthesia Plan  ASA: III  Anesthesia Plan: General   Post-op Pain Management:    Induction: Intravenous  PONV Risk Score and Plan: 3 and Midazolam, Dexamethasone, Ondansetron and Treatment may vary due to age or medical condition  Airway Management Planned: LMA  Additional Equipment: None  Intra-op Plan:   Post-operative Plan: Extubation in OR  Informed Consent: I have reviewed the patients History and Physical, chart, labs and discussed the procedure including the risks, benefits and alternatives for the proposed anesthesia with the patient or authorized representative who has indicated his/her understanding and acceptance.     Dental advisory given  Plan Discussed with: CRNA and Anesthesiologist  Anesthesia Plan Comments:        Anesthesia Quick Evaluation

## 2020-02-05 NOTE — Op Note (Signed)
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  No ulcerations.  Procedure: The patient was identified in the preoperative holding area. Informed consent was signed on the chart. Patient was seen history was reviewed and exam was performed.   The patient was then taken to the operating room and placed in the supine position with SCD hose on. General anesthesia was then induced without difficulty. She was then placed in the dorsolithotomy position. The perineum was prepped with Betadine. The vagina was prepped with Betadine. The patient was then draped after the prep was dried. A Foley catheter was inserted into the bladder under sterile conditions to drain the bladder then removed.  Timeout was performed the patient, procedure, antibiotic, allergy, and length of procedure. 5% acetic acid solution was applied to the perineum. The vulvar tissues were inspected for areas of acetowhite changes or leukoplakia. The lesions (desribed above), were identified. The subcuticular tissues were infiltrated with 1% lidocaine. 65mm punch biopsies of the clitoral hood, clitoris, and peri-anal region (3, 6 and 8 o'c) were taken.  The patient's surgical field was draped with wet towels. The staff and patient  ensured laser-safe eyewear and masks were fitted. The laser was set to 8 watts continuous. The laser was tested for accuracy on a tongue depressor.  The laser was applied to the circumscribed area of the clitoris and the peri-anal skin that had been previously identified. The tissue was ablated to the desired depth and the eschar was removed with a moistened sponge. When the entire lesion had been ablated the procedure was complete.  Silvadine cream was not applied to the laser site given listed allergy. Instead, it was applied to the skin of right leg to test for any reaction. The patient was instructed to apply to vulva only if no rash or other reaction noted..  All instrument, suture, laparotomy, Ray-Tec, and needle counts were correct x2. The patient tolerated the procedure well and was taken recovery room in stable condition.   Jeral Pinch MD Gynecologic Oncology

## 2020-02-05 NOTE — Discharge Instructions (Signed)
Post Operative Instructions Following Laser Surgery  I am sending you home with a soothing cream to put on the areas that I lasered. The pharmacist did not think you would develop a rash to this medication. It shares a compound similar to sulfa medications. I placed some on your inside right leg by your knee. If you do not develop any sort of rash or skin reaction there, then I think it is safe to use this cream on the clitoris and the areas around the anus where I lasered.  Laser treatment of precancerous areas on the vulva: The laser actually creates a burn effect on the skin to accomplish this.  The following instructions will help in you comfort postoperatively:  First 24 h  Remove any dressing this evening after you return home from the hospital  Sit in a tub or sitz bath of COOL water for 20 minutes every hour while awake.  After the bath, carefully blot the area dry and place a piece of 100% Cotton with Silvadene on it next to the anal opening.  You may sit on a covered ice pack between the baths as needed.    You should have crushed ice in small amounts until you are able to urinate.  After you urinate, you may drink fluids.  It is normal to not urinate for the first few hours after surgery.  If you become uncomfortable, call the office for instructions.   Take your pain medications as prescribed  You may eat whatever you feel like.  Start with a light meal and gradually advance your diet.    Beginning the day after your surgery  Sit in a tub of cool to warm water for 15 minutes at least 3 times a day and after bowel movements  After the bowel movement, clean with wet cotton, Tucks or baby wipes.  Apply Silvadene (only if you have had NO skin reaction to the area I placed the cream near your right knee) to a thin piece of cotton and place over the treated area after your baths. You can leave a cotton ball along the anal opening (or pad); this will collect any drainage or bleeding.   Drainage is usually a pink to tan color and is normal for the following 2-3 weeks after surgery.  Change to cotton ever 2-3 hours while awake.  Diet  Eat a regular diet.  Avoid foods that may constipate you or give you diarrhea.  Avoid foods with seeds, nuts, corn or popcorn.  Beginning the day after surgery, drink 6-8 glasses of water a day in addition to your meals.  Limit you caffeine intake to 1-2 servings per day.  Medication  Take a fiber supplement (Metamucil, Citrucel, FiberCon) twice a day.  Take a stool softener like Colace twice daily  Take your pain medication as directed.  You may use Extra Strength Tylenol instead of your prescribed pain medication to relieve mild discomfort.  Avoid products containing aspirin.  Bowel Habits  You should have a bowel movement at least every other day.  If you are constipated, you may take a Fleet enema or 2 Dulcolax tablets.  Call the office if no results occur.  You may bear down a normal amount to have a bowel movement without hurting your tissues after the operation.  Activity  Walking is encouraged.  You may drive when you are no longer on prescription pain medication.  You may go up and down stairs carefully.  No heavy lifting or  strenuous activity until after your first post operative appointment  Do NOT sit on a rubber ring; instead use a soft pillow if needed.  Call the office if you have any questions or concerns.  Call IMMEDIATELY if you develop:  Fever greater than 100 F  Difficulty urinating   Do not take any Tylenol until after 7:00 pm today.     Post Anesthesia Home Care Instructions  Activity: Get plenty of rest for the remainder of the day. A responsible individual must stay with you for 24 hours following the procedure.  For the next 24 hours, DO NOT: -Drive a car -Paediatric nurse -Drink alcoholic beverages -Take any medication unless instructed by your physician -Make any legal decisions or sign  important papers.  Meals: Start with liquid foods such as gelatin or soup. Progress to regular foods as tolerated. Avoid greasy, spicy, heavy foods. If nausea and/or vomiting occur, drink only clear liquids until the nausea and/or vomiting subsides. Call your physician if vomiting continues.  Special Instructions/Symptoms: Your throat may feel dry or sore from the anesthesia or the breathing tube placed in your throat during surgery. If this causes discomfort, gargle with warm salt water. The discomfort should disappear within 24 hours.  If you had a scopolamine patch placed behind your ear for the management of post- operative nausea and/or vomiting:  1. The medication in the patch is effective for 72 hours, after which it should be removed.  Wrap patch in a tissue and discard in the trash. Wash hands thoroughly with soap and water. 2. You may remove the patch earlier than 72 hours if you experience unpleasant side effects which may include dry mouth, dizziness or visual disturbances. 3. Avoid touching the patch. Wash your hands with soap and water after contact with the patch.

## 2020-02-05 NOTE — Anesthesia Procedure Notes (Signed)
Procedure Name: LMA Insertion Date/Time: 02/05/2020 1:59 PM Performed by: Rogers Blocker, CRNA Pre-anesthesia Checklist: Patient identified, Emergency Drugs available, Suction available and Patient being monitored Patient Re-evaluated:Patient Re-evaluated prior to induction Oxygen Delivery Method: Circle System Utilized Preoxygenation: Pre-oxygenation with 100% oxygen Induction Type: IV induction Ventilation: Mask ventilation without difficulty LMA: LMA inserted LMA Size: 4.0 Number of attempts: 1 Placement Confirmation: positive ETCO2 Tube secured with: Tape Dental Injury: Teeth and Oropharynx as per pre-operative assessment

## 2020-02-05 NOTE — Progress Notes (Signed)
Perianal surgical area has had scant amount of sanguinous drainage since completion of surgery. Site otherwise unremarkable.

## 2020-02-05 NOTE — Interval H&P Note (Signed)
History and Physical Interval Note:  02/05/2020 1:04 PM  Courtney Hamilton  has presented today for surgery, with the diagnosis of VULVAR INTRAEPITHELIAL NEOPLASIA III.  The various methods of treatment have been discussed with the patient and family. After consideration of risks, benefits and other options for treatment, the patient has consented to  Procedure(s): EXAM UNDER ANESTHESIA WITH VULVAR AND PERIANAL BIOPSY (N/A) CO2 LASER APPLICATION OF VULVA WITH POSSIBLE VAGINAL LASER (N/A) as a surgical intervention.  The patient's history has been reviewed, patient examined, no change in status, stable for surgery.  I have reviewed the patient's chart and labs.  Questions were answered to the patient's satisfaction.     Lafonda Mosses

## 2020-02-06 ENCOUNTER — Encounter (HOSPITAL_BASED_OUTPATIENT_CLINIC_OR_DEPARTMENT_OTHER): Payer: Self-pay | Admitting: Gynecologic Oncology

## 2020-02-06 ENCOUNTER — Telehealth: Payer: Self-pay

## 2020-02-06 LAB — SURGICAL PATHOLOGY

## 2020-02-06 NOTE — Anesthesia Postprocedure Evaluation (Signed)
Anesthesia Post Note  Patient: Courtney Hamilton  Procedure(s) Performed: EXAM UNDER ANESTHESIA  AND BIOPSY OF THE CLITORAL HOOD, CLITORIS, AND PERI-ANAL ACETOWHITE AREAS (N/A Vagina ) CO2 LASER APPLICATION OF VULVA (PERI-CLITORAL AND PERI-ANAL) (N/A Vagina )     Patient location during evaluation: PACU Anesthesia Type: General Level of consciousness: awake and alert Pain management: pain level controlled Vital Signs Assessment: post-procedure vital signs reviewed and stable Respiratory status: spontaneous breathing, nonlabored ventilation, respiratory function stable and patient connected to nasal cannula oxygen Cardiovascular status: blood pressure returned to baseline and stable Postop Assessment: no apparent nausea or vomiting Anesthetic complications: no   No complications documented.  Last Vitals:  Vitals:   02/05/20 1545 02/05/20 1620  BP: 122/84 (!) 141/88  Pulse: 61 60  Resp: 12 18  Temp:  36.5 C  SpO2: 93% 100%    Last Pain:  Vitals:   02/05/20 1545  TempSrc:   PainSc: 5                  Candra R Hamid [place]

## 2020-02-06 NOTE — Telephone Encounter (Signed)
Courtney Hamilton states that she is eating,drinking, and urinating well. Using the spritz bottle as directed, using the sitz bath as well. She did not develop any reaction to the Silvadine cream on her inner right knee. She is applying it as directed. She had a BM this am. She is using the ibuprofen for pain with an intermittent Oxycodone.  Pain level is a 5/10 currently and pt comfortable with this level of pain.   Afebrile. Pt aware of post op appointments as well as the office number [phone] and after hours number [phone] to call if she has any questions or concerns

## 2020-02-19 ENCOUNTER — Telehealth: Payer: Self-pay

## 2020-02-19 NOTE — Telephone Encounter (Signed)
Told Ms. Ramone that she does not need to continue the use of the silvadene cream per Joylene John, NP. No refill needed. Pt verbalized understanding.

## 2020-02-26 ENCOUNTER — Encounter: Payer: Medicare HMO | Admitting: Gynecologic Oncology

## 2020-03-04 MED FILL — DESCOVY 200-25 MG TABS: 200-25 | 30 days supply | Qty: 30 | Fill #5

## 2020-03-04 MED FILL — TIVICAY 50 MG TABLET: 50 | 30 days supply | Qty: 30 | Fill #5

## 2020-03-08 NOTE — Progress Notes (Signed)
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 On 01/18/18, she underwent WLE of two right vulvar lesions; final pathology revealed VIN3. Posterior lesion had a positive lateral margin.   4/26: Robotic assisted total laparoscopic hysterectomy with right salpingo-oophorectomy for thickened endometrial lining and cervical stenosis with findings of pyometriaat the time of surgery. Final pathology revealed extensive high-grade squamous intraepithelial lesion, CIN-3, of the cervix, otherwise no hyperplasia or malignancy. Margins negative.  02/05/20: biopsy of clitoral hood, clitoris and peri-anal lesions; CO2 laser of the vulva Pathology: hyperkeratosis, chronic inflammation, no dysplasia or malignancy  Interval History: Healing well since surgery.  Had some mild pruritus of the clitoris initially; now denies any pruritus, burning, or vulvar pain.  She denies any vaginal bleeding or discharge.  She reports normal bowel function.  She continues to have some discomfort with emptying her bladder, but this seems to have improved some with the use of a support belt.  She is scheduled to start pelvic physical therapy in February.  Past Medical/Surgical History: Past Medical History:  Diagnosis Date  . Arthritis    right hip  . Bronchitis last time  sept/oct 2021  . Hepatitis 1980's   NON A NON B tx for 1980's resolved  . Herniated disc 10/07/2011   lower back  . History of chronic kidney disease 1980's   resolved  . HIV (human immunodeficiency virus infection) ([place]) 1985  . Hypertension   . Lower GI bleed 05/10/2012   pt denies  . Neuropathy    resolved  . Osteoporosis   . Ruptured lumbar disc   . Sciatica    right side  . Substance abuse ([place])    past hsitory  clean more than 20 years   . TB (pulmonary tuberculosis) 1993     exposure, treated   . VIN III (vulvar intraepithelial neoplasia III)     Past Surgical History:  Procedure Laterality Date  . APPENDECTOMY  1980's  . BREAST EXCISIONAL BIOPSY Left 2001 per pt   cyst removed   . CHOLECYSTECTOMY  1980's  . CO2 LASER APPLICATION N/A [id number]   Procedure: CO2 LASER APPLICATION OF VULVA (PERI-CLITORAL AND PERI-ANAL);  Surgeon: Lafonda Mosses, MD;  Location: [facility];  Service: Gynecology;  Laterality: N/A;  . ECTOPIC PREGNANCY SURGERY  1985  . injections to lower back  09/2017  . LAPAROSCOPY N/A 07/03/2019   Procedure: LAPAROSCOPY DIAGNOSTIC; LYSIS OF ADHESIONS;  Surgeon: Lafonda Mosses, MD;  Location: [facility];  Service: Gynecology;  Laterality: N/A;  . ROBOTIC ASSISTED TOTAL HYSTERECTOMY WITH BILATERAL SALPINGO OOPHERECTOMY N/A 07/03/2019   Procedure: XI ROBOTIC ASSISTED TOTAL HYSTERECTOMY WITH UNILATERAL SALPINGO OOPHORECTOMY;  Surgeon: Lafonda Mosses, MD;  Location: [facility];  Service: Gynecology;  Laterality: N/A;  . SALIVARY GLAND SURGERY  1990  . VULVECTOMY N/A 01/18/2018   Procedure: PARTIAL VULVECTOMY;  Surgeon: [name], MD;  Location: [facility];  Service: Gynecology;  Laterality: N/A;    Family History  Problem Relation Age of Onset  . Asthma Father   . COPD Father   . Cancer Mother   . Diabetes Brother   . Hypertension Brother   . Diabetes Brother   . Breast cancer Sister   . Colon cancer Maternal Uncle     Social History   Socioeconomic History  . Marital  status: Married    Spouse name: Not on file  . Number of children: Not on file  . Years of education: Not on file  . Highest education level: Not on file  Occupational History  . Not on file  Tobacco Use  . Smoking status: Former Smoker    Packs/day: 0.50    Years: 30.00    Pack years: 15.00    Types: Cigarettes  . Smokeless tobacco: Never Used  . Tobacco comment: quit march 2018  Vaping Use  . Vaping Use: Never  used  Substance and Sexual Activity  . Alcohol use: No  . Drug use: Yes    Types: Marijuana    Comment: past history of cocaine  and alcohol  none in 20 years  . Sexual activity: Yes    Partners: Male    Comment: given condoms  Other Topics Concern  . Not on file  Social History Narrative  . Not on file   Social Determinants of Health   Financial Resource Strain: Not on file  Food Insecurity: Not on file  Transportation Needs: Not on file  Physical Activity: Not on file  Stress: Not on file  Social Connections: Not on file    Current Medications:  Current Outpatient Medications:  .  acyclovir ointment (ZOVIRAX) 5 %, Apply 1 application topically every 3 (three) hours. (Patient taking differently: Apply 1 application topically every 3 (three) hours as needed (irritation.).), Disp: 30 g, Rfl: 0 .  Ascorbic Acid (VITAMIN C) 1000 MG tablet, Take 1,000 mg by mouth at bedtime., Disp: , Rfl:  .  Biotin 1000 MCG tablet, Take 2,000 mcg by mouth daily in the afternoon., Disp: , Rfl:  .  conjugated estrogens (PREMARIN) vaginal cream, Place 1 Applicatorful vaginally 3 (three) times a week., Disp: 42.5 g, Rfl: 1 .  DESCOVY 200-25 MG tablet, TAKE 1 TABLET BY MOUTH DAILY., Disp: 30 tablet, Rfl: 5 .  gabapentin (NEURONTIN) 300 MG capsule, TAKE 1 CAPSULE BY MOUTH THREE TIMES A DAY (Patient taking differently: Take 600 mg by mouth at bedtime.), Disp: 270 capsule, Rfl: 1 .  ibuprofen (ADVIL) 800 MG tablet, Take 1 tablet (800 mg total) by mouth every 8 (eight) hours as needed for moderate pain. For AFTER surgery only, Disp: 30 tablet, Rfl: 0 .  losartan (COZAAR) 50 MG tablet, Take 50 mg by mouth daily., Disp: , Rfl:  .  Multiple Minerals-Vitamins (CAL-MAG ZINC II PO), Take 1 tablet by mouth daily in the afternoon., Disp: , Rfl:  .  Omega-3 Fatty Acids (FISH OIL PO), Take 2,000 mg by mouth daily as needed (hot flashes.)., Disp: , Rfl:  .  senna-docusate (SENOKOT-S) 8.6-50 MG tablet, Take 2 tablets by  mouth at bedtime. For AFTER surgery, do not take if having diarrhea, Disp: 30 tablet, Rfl: 0 .  TIVICAY 50 MG tablet, TAKE 1 TABLET (50 MG TOTAL) BY MOUTH DAILY., Disp: 30 tablet, Rfl: 5 .  traMADol (ULTRAM) 50 MG tablet, Take 2 tablets (100 mg total) by mouth 3 (three) times daily as needed for moderate pain., Disp: 15 tablet, Rfl: 0 .  zolpidem (AMBIEN) 10 MG tablet, Take 1 tablet (10 mg total) by mouth at bedtime as needed for up to 20 days for sleep., Disp: 30 tablet, Rfl: 0  Current Facility-Administered Medications:  .  acyclovir ointment (ZOVIRAX) 5 %, , Topical, Q3H, Conan Bowens, MD  Review of Systems: Denies appetite changes, fevers, chills, fatigue, unexplained weight changes. Denies hearing loss, neck lumps or  masses, mouth sores, ringing in ears or voice changes. Denies cough or wheezing.  Denies shortness of breath. Denies chest pain or palpitations. Denies leg swelling. Denies abdominal distention, pain, blood in stools, constipation, diarrhea, nausea, vomiting, or early satiety. Denies pain with intercourse, dysuria, frequency, hematuria or incontinence. Denies hot flashes, pelvic pain, vaginal bleeding or vaginal discharge.   Denies joint pain, back pain or muscle pain/cramps. Denies itching, rash, or wounds. Denies dizziness, headaches, numbness or seizures. Denies swollen lymph nodes or glands, denies easy bruising or bleeding. Denies anxiety, depression, confusion, or decreased concentration.  Physical Exam: BP (!) 114/97 (BP Location: Right Arm, Patient Position: Sitting)   Pulse 61   Temp 97.8 F (36.6 C) (Tympanic)   Resp 17   Ht 5\' 6"  (1.676 m)   Wt 183 lb 3.2 oz (83.1 kg)   SpO2 99%   BMI 29.57 kg/m  General: Alert, oriented, no acute distress. HEENT: Normocephalic, atraumatic, sclera anicteric. Chest: Unlabored breathing on room air. Extremities: Grossly normal range of motion.  Warm, well perfused.  No edema bilaterally. GU: Normal appearing external  genitalia without erythema, excoriation, or lesions.  Some hyperkeratosis noted at the clitoris, otherwise laser treated areas all healing very well.  No erythema or exudate.  Laboratory & Radiologic Studies: A. CLITORAL HEAD, BIOPSY:  - Hyperkeratosis and underlying chronic inflammation.  - No dysplasia or malignancy.   B. CLITORAL, BIOPSY:  - Hyperkeratosis and underlying chronic inflammation.  - No dysplasia or malignancy.   C. PERIANAL, 12:00, BIOPSY:  - Superficial fragment of hyperkeratotic squamous mucosa.  - No dysplasia or malignancy.   D. PERIANAL, 3:00, BIOPSY:  - Hyperkeratosis and underlying chronic inflammation.  - No dysplasia or malignancy.   E. PERIANAL, 8:00, BIOPSY:  - Hyperkeratosis and underlying chronic inflammation.  - No dysplasia or malignancy.  Assessment & Plan: Courtney Hamilton is a 59 y.o. woman with history of both high-grade vulvar and cervical dysplasia, who is now s/p biopsies and CO2 laser ablation of the vulva for clitoral and perianal lesions concerning for recurrent dysplasia with final pathology revealing hyperkeratosis.  Patient has recovered very well from surgery and her exam is quite reassuring.  I discussed that despite biopsies not showing dysplasia, I am still suspicious for an HPV related process given the keratosis noted.  We discussed either close surveillance or the addition of vaginal/vulvar estrogen.  There is some data to support that vaginal estrogen is effective in treating low-grade dysplasia.  Patient was interested in trying vaginal estrogen and a prescription was sent to her pharmacy.  Per ASCCP guidelines, the patient should continue to have lifetime surveillance in the setting of her HIV positivity and high-grade dysplasia.  Pap test at 6 months after surgery was negative. I would recommend either cytology or cotesting again in 6 months.  If negative, then can continue with either cytology or cotesting yearly.  25 minutes of total  time was spent for this patient encounter, including preparation, face-to-face counseling with the patient and coordination of care, and documentation of the encounter.  Jeral Pinch, MD  Division of Gynecologic Oncology  Department of Obstetrics and Gynecology  [facility]

## 2020-03-11 ENCOUNTER — Encounter: Payer: Self-pay | Admitting: Gynecologic Oncology

## 2020-03-11 ENCOUNTER — Inpatient Hospital Stay: Payer: Medicare HMO | Attending: Gynecologic Oncology | Admitting: Gynecologic Oncology

## 2020-03-11 ENCOUNTER — Other Ambulatory Visit: Payer: Self-pay

## 2020-03-11 VITALS — BP 114/97 | HR 61 | Temp 97.8°F | Resp 17 | Ht 66.0 in | Wt 183.2 lb

## 2020-03-11 DIAGNOSIS — D071 Carcinoma in situ of vulva: Secondary | ICD-10-CM

## 2020-03-11 DIAGNOSIS — N903 Dysplasia of vulva, unspecified: Secondary | ICD-10-CM

## 2020-03-11 DIAGNOSIS — D069 Carcinoma in situ of cervix, unspecified: Secondary | ICD-10-CM

## 2020-03-11 MED ORDER — ESTROGENS, CONJUGATED 0.625 MG/GM VA CREA: 1.0000 | TOPICAL_CREAM | VAGINAL | 1 refills | Status: DC

## 2020-03-11 NOTE — Patient Instructions (Signed)
You are very well from surgery.  I sent the vaginal estrogen prescription to your pharmacy.  Please follow-up with Dr. Earlene Plater as scheduled.

## 2020-03-19 ENCOUNTER — Other Ambulatory Visit: Payer: Self-pay | Admitting: Internal Medicine

## 2020-03-19 DIAGNOSIS — Z21 Asymptomatic human immunodeficiency virus [HIV] infection status: Secondary | ICD-10-CM

## 2020-03-27 MED FILL — DESCOVY 200-25 MG TABS: 200-25 | 30 days supply | Qty: 30 | Fill #0

## 2020-03-27 MED FILL — TIVICAY 50 MG TABLET: 50 | 30 days supply | Qty: 30 | Fill #0

## 2020-04-10 ENCOUNTER — Ambulatory Visit: Payer: Medicare HMO | Attending: Gynecologic Oncology | Admitting: Physical Therapy

## 2020-04-10 ENCOUNTER — Other Ambulatory Visit: Payer: Self-pay

## 2020-04-10 ENCOUNTER — Encounter: Payer: Self-pay | Admitting: Physical Therapy

## 2020-04-10 DIAGNOSIS — R278 Other lack of coordination: Secondary | ICD-10-CM | POA: Diagnosis present

## 2020-04-10 DIAGNOSIS — M545 Low back pain, unspecified: Secondary | ICD-10-CM | POA: Diagnosis present

## 2020-04-10 DIAGNOSIS — R252 Cramp and spasm: Secondary | ICD-10-CM | POA: Diagnosis present

## 2020-04-10 DIAGNOSIS — M6281 Muscle weakness (generalized): Secondary | ICD-10-CM

## 2020-04-10 DIAGNOSIS — G8929 Other chronic pain: Secondary | ICD-10-CM | POA: Diagnosis present

## 2020-04-10 DIAGNOSIS — R3989 Other symptoms and signs involving the genitourinary system: Secondary | ICD-10-CM | POA: Diagnosis present

## 2020-04-10 NOTE — Patient Instructions (Signed)
Access Code: [email or web address] URL: [email or web address] Date: 04/10/2020 Prepared by: Earlie Counts  Exercises Seated Piriformis Stretch with Trunk Bend - 1 x daily - 7 x weekly - 1 sets - 2 reps - 30 sec hold Seated Hamstring Stretch - 1 x daily - 7 x weekly - 1 sets - 2 reps - 30 sec hold [facility] [address], [place], [place] [address] Phone # [phone] Fax [phone]

## 2020-04-10 NOTE — Therapy (Signed)
[facility] [address], [place], [place], [address] Phone: [phone]   Fax:  [phone]  Physical Therapy Evaluation  Patient Details  Name: Courtney Hamilton MRN: [id number] Date of Birth: 24-Dec-1962 Referring Provider (PT): Dr. Jeral Pinch   Encounter Date: 04/10/2020   PT End of Session - 04/10/20 1149    Visit Number 1    Date for PT Re-Evaluation 07/03/20    Authorization Type medicare/medicaid    Authorization - Visit Number 1    Authorization - Number of Visits 10    PT Start Time 1100    PT Stop Time 1140    PT Time Calculation (min) 40 min    Activity Tolerance Patient tolerated treatment well;No increased pain    Behavior During Therapy WFL for tasks assessed/performed           Past Medical History:  Diagnosis Date  . Arthritis    right hip  . Bronchitis last time  sept/oct 2021  . Hepatitis 1980's   NON A NON B tx for 1980's resolved  . Herniated disc 10/07/2011   lower back  . History of chronic kidney disease 1980's   resolved  . HIV (human immunodeficiency virus infection) ([place]) 1985  . Hypertension   . Lower GI bleed 05/10/2012   pt denies  . Neuropathy    resolved  . Osteoporosis   . Ruptured lumbar disc   . Sciatica    right side  . Substance abuse ([place])    past hsitory  clean more than 20 years   . TB (pulmonary tuberculosis) 1993    exposure, treated   . VIN III (vulvar intraepithelial neoplasia III)     Past Surgical History:  Procedure Laterality Date  . APPENDECTOMY  1980's  . BREAST EXCISIONAL BIOPSY Left 2001 per pt   cyst removed   . CHOLECYSTECTOMY  1980's  . CO2 LASER APPLICATION N/A 42/35/3614   Procedure: CO2 LASER APPLICATION OF VULVA (PERI-CLITORAL AND PERI-ANAL);  Surgeon: Lafonda Mosses, MD;  Location: [facility];  Service: Gynecology;  Laterality: N/A;  . ECTOPIC PREGNANCY SURGERY  1985  . injections to lower back  09/2017  .  LAPAROSCOPY N/A 07/03/2019   Procedure: LAPAROSCOPY DIAGNOSTIC; LYSIS OF ADHESIONS;  Surgeon: Lafonda Mosses, MD;  Location: [facility];  Service: Gynecology;  Laterality: N/A;  . ROBOTIC ASSISTED TOTAL HYSTERECTOMY WITH BILATERAL SALPINGO OOPHERECTOMY N/A 07/03/2019   Procedure: XI ROBOTIC ASSISTED TOTAL HYSTERECTOMY WITH UNILATERAL SALPINGO OOPHORECTOMY;  Surgeon: Lafonda Mosses, MD;  Location: [facility];  Service: Gynecology;  Laterality: N/A;  . SALIVARY GLAND SURGERY  1990  . VULVECTOMY N/A 01/18/2018   Procedure: PARTIAL VULVECTOMY;  Surgeon: [name], MD;  Location: [facility];  Service: Gynecology;  Laterality: N/A;    There were no vitals filed for this visit.    Subjective Assessment - 04/10/20 1108    Subjective April 2021 had a hystrectomy and had pain with urination. Her bladder had dropped.    Patient Stated Goals reduce pain    Currently in Pain? Yes    Pain Score 10-Worst pain ever    Pain Location Other (Comment)   bladder   Pain Orientation Mid    Pain Descriptors / Indicators Sharp;Pressure    Pain Type Acute pain    Pain Onset More than a month ago    Pain Frequency Intermittent    Aggravating Factors  urination, bowel movement    Pain  Relieving Factors wait it out    Effect of Pain on Daily Activities reduce bladder pain    Multiple Pain Sites Yes    Pain Score 7    Pain Location Back    Pain Orientation Lower    Pain Descriptors / Indicators Sharp    Pain Type Chronic pain    Pain Onset More than a month ago    Pain Frequency Intermittent    Aggravating Factors  sit with legs stretched out,    Pain Relieving Factors ice, not standing more than 10 minutes              [facility] PT Assessment - 04/10/20 0001      Assessment   Medical Diagnosis R39.89 Bladder pain    Referring Provider (PT) Dr. Jeral Pinch    Onset Date/Surgical Date --   07/03/2019   Prior Therapy none      Precautions   Precautions Other (comment)     Precaution Comments HIV; osteoporosis      Restrictions   Weight Bearing Restrictions No      Balance Screen   Has the patient fallen in the past 6 months Yes    How many times? 1   due to ice   Has the patient had a decrease in activity level because of a fear of falling?  No    Is the patient reluctant to leave their home because of a fear of falling?  No      Home Ecologist residence      Prior Function   Level of Independence Independent      Cognition   Overall Cognitive Status Within Functional Limits for tasks assessed      Posture/Postural Control   Posture/Postural Control Postural limitations    Postural Limitations Decreased lumbar lordosis;Posterior pelvic tilt      ROM / Strength   AROM / PROM / Strength AROM;PROM;Strength      PROM   Right Hip Flexion 95    Right Hip External Rotation  35    Left Hip Flexion 95    Left Hip External Rotation  45      Strength   Right Hip Extension 3-/5    Right Hip ABduction 3+/5    Left Hip Extension 3-/5    Left Hip ABduction 3+/5      Palpation   Palpation comment tenderness throughout the abdomen, lumbar area; limited mobility of the lower rib cage with breath                      Objective measurements completed on examination: See above findings.     Pelvic Floor Special Questions - 04/10/20 0001    Prior Pregnancies Yes    Number of Pregnancies 1    Number of Vaginal Deliveries 1   needed a pessary to hold uterus in place   Currently Sexually Active Yes    Is this Painful Yes   deep and initial penetration   Marinoff Scale discomfort that does not affect completion    Urinary Leakage Yes    Pad use no    Activities that cause leaking With strong urge    Urinary urgency Yes    Fecal incontinence No    Exam Type Deferred   due to time                   PT Education - 04/10/20 1154  Education Details Access Code: [email or web address]    Person(s) Educated  Patient    Methods Explanation;Demonstration;Verbal cues;Handout    Comprehension Returned demonstration;Verbalized understanding            PT Short Term Goals - 04/10/20 1205      PT SHORT TERM GOAL #1   Title independent with initial HEP with stretches and diaphragmatic breating    Time 4    Period Weeks    Status New    Target Date 05/08/20      PT SHORT TERM GOAL #2   Title bladder pain with urination decreased >/= 25% due to restrictions of the bladder    Time 4    Period Weeks    Status New    Target Date 05/08/20             PT Long Term Goals - 04/10/20 1206      PT LONG TERM GOAL #1   Title independent with advanced HEP for strengthening    Time 12    Period Weeks    Status New    Target Date 07/03/20      PT LONG TERM GOAL #2   Title bladder pain with urination decreased >/= 50% due to reduction of muscle spasms and the ability to relax the pelvic floor    Time 12    Period Weeks    Status New    Target Date 07/03/20      PT LONG TERM GOAL #3   Title able to walk to the commode slowly after she has a strong urge to urinate due to using behavioral techniques    Time 12    Period Weeks    Status New    Target Date 07/03/20      PT LONG TERM GOAL #4   Title able to diaphragmatically breath to bulge the pelvic floor and relax the bladder    Time 12    Period Weeks    Status New    Target Date 07/03/20                  Plan - 04/10/20 1154    Clinical Impression Statement Patient is a 60 year old female with bladder pain since she had her hysterectomy on 07/03/2019. Patient has bladder pain with urination and sometimes a bowel movement at level 10/10. Patient reports intermittent chronic back pain at level 7/10 when she sits upright. Patient reports she fell last week due to the ice not her balance. Patient has a history of Lichen plaus of clitoral hood and vulvar intraepithial neoplasia III. Patient reports she has no difficulty with her  urine stream and does not need to strain when urination. Patient will leak urine with a strong urge and has to go to the bathroom immediately. She has decreased bilateral hip flexion and external rotation P/ROM. She has decreased bilateral hip extension and abduction. Patient has decreased lumbar ROM. Patient stands with reduced lumbar lordosis and posterior pelvic tilt and her buttocks clenched. Patient has tenderness located throughout the abdomen. Patient deferred an internal assessment of the pelvic floor due to being short on time. Patient will benefit from skilled therapy to reduce her bladder pain and improve pelvic floor function.    Personal Factors and Comorbidities Age;Fitness;Comorbidity 3+;Sex    Comorbidities HIV; Hysterectomy 07/03/2019; osteoporosis; Vulvectomy 01/18/2018    Examination-Activity Limitations Sit;Continence;Toileting;Stand    Examination-Participation Restrictions Interpersonal Relationship;Community Activity;Driving;Shop    Stability/Clinical Decision Making Evolving/Moderate complexity  Clinical Decision Making Moderate    Rehab Potential Good    PT Frequency 1x / week    PT Duration 12 weeks    PT Treatment/Interventions ADLs/Self Care Home Management;Biofeedback;Cryotherapy;Electrical Stimulation;Moist Heat;Ultrasound;Neuromuscular re-education;Therapeutic exercise;Therapeutic activities;Patient/family education;Manual techniques;Dry needling;Passive range of motion    PT Next Visit Plan work on lower rib cage mobility with breath, fascial restrictions release of the bladder area; assess pelvic floor is patient agrees; hip stretches with being careful of low back pain,    PT Home Exercise Plan Access Code: [email or web address]    Consulted and Agree with Plan of Care Patient           Patient will benefit from skilled therapeutic intervention in order to improve the following deficits and impairments:  Decreased coordination,Decreased range of motion,Increased fascial  restricitons,Decreased activity tolerance,Increased muscle spasms,Pain,Decreased strength,Decreased mobility  Visit Diagnosis: Muscle weakness (generalized) - Plan: PT plan of care cert/re-cert  Other lack of coordination - Plan: PT plan of care cert/re-cert  Chronic midline low back pain without sciatica - Plan: PT plan of care cert/re-cert  Bladder pain - Plan: PT plan of care cert/re-cert  Cramp and spasm - Plan: PT plan of care cert/re-cert     Problem List Patient Active Problem List   Diagnosis Date Noted  . 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 Thickened endometrium   . Cervical stenosis (uterine cervix)   . Vulvar intraepithelial neoplasia (VIN) grade 3   . Essential hypertension 08/05/2017  . Fungal infection of skin of abdomen 06/16/2017  . Flu-like symptoms 05/25/2016  . Head injury 11/05/2015  . Insomnia 11/05/2015  . Former cigarette smoker 11/05/2015  . Chronic low back pain without sciatica 02/18/2015  . Fall 01/14/2015  . Lower abdominal pain 10/08/2014  . Generalized anxiety disorder 06/04/2014  . Anxiety state 01/07/2014  . Panic attacks 12/22/2013  . Lumbosacral spondylosis without myelopathy 09/29/2013  . 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 Lumbar paraspinal muscle spasm 01/16/2013  . Reflux 12/13/2011  . Lower leg edema 12/10/2011  . Herpes simplex 10/07/2011  . Peripheral neuropathy 10/07/2011  . Hypercholesteremia 10/07/2011  . History of tuberculosis exposure 10/01/2011  . Recurrent UTI 08/06/2011  . HIV positive ([place]) 08/03/2011  . Sleep disorder 08/03/2011  . Back pain 07/21/2011  . Osteoporosis 03/09/2008    Earlie Counts, PT 04/10/20 12:14 PM   [facility] [address], [place], [place], [address] Phone: [phone]   Fax:  [phone]  Name: Lailah Marocco MRN: [id number] Date of Birth: [birth year]

## 2020-04-29 MED FILL — DESCOVY 200-25 MG TABS: 200-25 | 30 days supply | Qty: 30 | Fill #1

## 2020-04-29 MED FILL — TIVICAY 50 MG TABLET: 50 | 30 days supply | Qty: 30 | Fill #1

## 2020-05-01 ENCOUNTER — Other Ambulatory Visit: Payer: Self-pay

## 2020-05-01 ENCOUNTER — Ambulatory Visit: Payer: Medicare HMO | Admitting: Physical Therapy

## 2020-05-01 ENCOUNTER — Encounter: Payer: Self-pay | Admitting: Physical Therapy

## 2020-05-01 DIAGNOSIS — M6281 Muscle weakness (generalized): Secondary | ICD-10-CM | POA: Diagnosis not present

## 2020-05-01 DIAGNOSIS — R3989 Other symptoms and signs involving the genitourinary system: Secondary | ICD-10-CM

## 2020-05-01 DIAGNOSIS — R252 Cramp and spasm: Secondary | ICD-10-CM

## 2020-05-01 DIAGNOSIS — R278 Other lack of coordination: Secondary | ICD-10-CM

## 2020-05-01 DIAGNOSIS — M545 Low back pain, unspecified: Secondary | ICD-10-CM

## 2020-05-01 DIAGNOSIS — G8929 Other chronic pain: Secondary | ICD-10-CM

## 2020-05-01 NOTE — Patient Instructions (Signed)
Access Code: [email or web address] URL: [email or web address] Date: 05/01/2020 Prepared by: Earlie Counts  Program Notes lay on your back and massage from the pubic bone upward  for 2 minutes 1 time per day.    Exercises Seated Piriformis Stretch with Trunk Bend - 1 x daily - 7 x weekly - 1 sets - 2 reps - 30 sec hold Seated Hamstring Stretch - 1 x daily - 7 x weekly - 1 sets - 2 reps - 30 sec hold Hooklying Transversus Abdominis Palpation - 2 x daily - 7 x weekly - 1 sets - 10 reps - 5 sec hold [facility] [address] [place], [place] [address] Phone # [phone] Fax [phone]

## 2020-05-01 NOTE — Therapy (Signed)
[facility] [address], [place], [place], [address] Phone: [phone]   Fax:  [phone]  Physical Therapy Treatment  Patient Details  Name: Courtney Hamilton MRN: [id number] Date of Birth: 1960/05/17 Referring Provider (PT): Dr. Jeral Pinch   Encounter Date: 05/01/2020   PT End of Session - 05/01/20 1541    Visit Number 2    Date for PT Re-Evaluation 07/03/20    Authorization Type medicare/medicaid    Authorization - Visit Number 2    Authorization - Number of Visits 10    PT Start Time 5852    PT Stop Time 7782    PT Time Calculation (min) 45 min    Activity Tolerance Patient tolerated treatment well;No increased pain    Behavior During Therapy WFL for tasks assessed/performed           Past Medical History:  Diagnosis Date  . Arthritis    right hip  . Bronchitis last time  sept/oct 2021  . Hepatitis 1980's   NON A NON B tx for 1980's resolved  . Herniated disc 10/07/2011   lower back  . History of chronic kidney disease 1980's   resolved  . HIV (human immunodeficiency virus infection) ([place]) 1985  . Hypertension   . Lower GI bleed 05/10/2012   pt denies  . Neuropathy    resolved  . Osteoporosis   . Ruptured lumbar disc   . Sciatica    right side  . Substance abuse ([place])    past hsitory  clean more than 20 years   . TB (pulmonary tuberculosis) 1993    exposure, treated   . VIN III (vulvar intraepithelial neoplasia III)     Past Surgical History:  Procedure Laterality Date  . APPENDECTOMY  1980's  . BREAST EXCISIONAL BIOPSY Left 2001 per pt   cyst removed   . CHOLECYSTECTOMY  1980's  . CO2 LASER APPLICATION N/A 42/35/3614   Procedure: CO2 LASER APPLICATION OF VULVA (PERI-CLITORAL AND PERI-ANAL);  Surgeon: Lafonda Mosses, MD;  Location: [facility];  Service: Gynecology;  Laterality: N/A;  . ECTOPIC PREGNANCY SURGERY  1985  . injections to lower back  09/2017  .  LAPAROSCOPY N/A 07/03/2019   Procedure: LAPAROSCOPY DIAGNOSTIC; LYSIS OF ADHESIONS;  Surgeon: Lafonda Mosses, MD;  Location: [facility];  Service: Gynecology;  Laterality: N/A;  . ROBOTIC ASSISTED TOTAL HYSTERECTOMY WITH BILATERAL SALPINGO OOPHERECTOMY N/A 07/03/2019   Procedure: XI ROBOTIC ASSISTED TOTAL HYSTERECTOMY WITH UNILATERAL SALPINGO OOPHORECTOMY;  Surgeon: Lafonda Mosses, MD;  Location: [facility];  Service: Gynecology;  Laterality: N/A;  . SALIVARY GLAND SURGERY  1990  . VULVECTOMY N/A 01/18/2018   Procedure: PARTIAL VULVECTOMY;  Surgeon: [name], MD;  Location: [facility];  Service: Gynecology;  Laterality: N/A;    There were no vitals filed for this visit.   Subjective Assessment - 05/01/20 1451    Subjective I am doing the exercises. I have pressure in the bladder like she has to  go to the bathroom.    Patient Stated Goals reduce pain    Pain Score 7     Pain Location Other (Comment)   bladder   Pain Orientation Mid    Pain Descriptors / Indicators Sharp;Pressure    Pain Type Acute pain    Pain Onset More than a month ago    Pain Frequency Intermittent    Aggravating Factors  urination, bowel movement    Pain Relieving Factors wait  it out    Pain Score 5    Pain Location Back    Pain Orientation Lower    Pain Descriptors / Indicators Sharp    Pain Type Chronic pain    Pain Onset More than a month ago    Pain Frequency Intermittent    Aggravating Factors  sit with legs stretched out    Pain Relieving Factors ice, not standing more than 10 minutes                          Pelvic Floor Special Questions - 05/01/20 0001    Pelvic Floor Internal Exam Patient confirms identification and approves PT to assess pelvic floor and treatment    Exam Type Vaginal    Palpation tightness located on the puborectalis, superficial transverse, urethra spincter, sides of the bladder, along the pubovesical ligament, levator ani     Strength fair squeeze, definite lift   more anterior than posterior            [facility] Adult PT Treatment/Exercise - 05/01/20 0001      Neuro Re-ed    Neuro Re-ed Details  while therapist has her index finger into the vaginal canal feeling for pelvic floor contraction with contraction of the lower abdominal and giving the pelvic floor tactile cues to engage; after contraction tactile cues and instruction on breath to elongate the pelvic floor      Manual Therapy   Manual Therapy Soft tissue mobilization;Internal Pelvic Floor    Soft tissue mobilization manual mobilization of the suprapubic region and educated patient on how to perform on herself    Internal Pelvic Floor manual work to the levator ani, superficial trnasverse, urethra sphincter, sides of the bladder and along the pubovesical ligament                  PT Education - 05/01/20 1541    Education Details Access Code: [email or web address]    Person(s) Educated Patient    Methods Explanation;Demonstration;Verbal cues;Handout    Comprehension Returned demonstration;Verbalized understanding            PT Short Term Goals - 04/10/20 1205      PT SHORT TERM GOAL #1   Title independent with initial HEP with stretches and diaphragmatic breating    Time 4    Period Weeks    Status New    Target Date 05/08/20      PT SHORT TERM GOAL #2   Title bladder pain with urination decreased >/= 25% due to restrictions of the bladder    Time 4    Period Weeks    Status New    Target Date 05/08/20             PT Long Term Goals - 04/10/20 1206      PT LONG TERM GOAL #1   Title independent with advanced HEP for strengthening    Time 12    Period Weeks    Status New    Target Date 07/03/20      PT LONG TERM GOAL #2   Title bladder pain with urination decreased >/= 50% due to reduction of muscle spasms and the ability to relax the pelvic floor    Time 12    Period Weeks    Status New    Target Date 07/03/20      PT LONG  TERM GOAL #3   Title able to walk to the commode slowly after  she has a strong urge to urinate due to using behavioral techniques    Time 12    Period Weeks    Status New    Target Date 07/03/20      PT LONG TERM GOAL #4   Title able to diaphragmatically breath to bulge the pelvic floor and relax the bladder    Time 12    Period Weeks    Status New    Target Date 07/03/20                 Plan - 05/01/20 1542    Clinical Impression Statement Patient was able to elongate her pelvic floor after manual work. She was able to contract the pelvic floor with tactile cues and tactile cues to engage the lower abdominal. Patient had restrictions on the sides of the bladder, urethra sphincter, pubovesical ligaments, superficial transverse muscle. Patient bladder pain was reduced after therapy. Pelvic floor strength is 3/5 with better contraction anterior and posterior. Patient will benefit from skilled therapy to reduce her bladder pain and improve pelvic floor function.    Personal Factors and Comorbidities Age;Fitness;Comorbidity 3+;Sex    Comorbidities HIV; Hysterectomy 07/03/2019; osteoporosis; Vulvectomy 01/18/2018    Examination-Activity Limitations Sit;Continence;Toileting;Stand    Examination-Participation Restrictions Interpersonal Relationship;Community Activity;Driving;Shop    Stability/Clinical Decision Making Evolving/Moderate complexity    Rehab Potential Good    PT Frequency 1x / week    PT Duration 12 weeks    PT Treatment/Interventions ADLs/Self Care Home Management;Biofeedback;Cryotherapy;Electrical Stimulation;Moist Heat;Ultrasound;Neuromuscular re-education;Therapeutic exercise;Therapeutic activities;Patient/family education;Manual techniques;Dry needling;Passive range of motion    PT Next Visit Plan work on lower rib cage mobility with breath, fascial restrictions release of the bladder area and internal work; hip stretches with being careful of low back pain, diaphragmatic  breathing    PT Home Exercise Plan Access Code: [email or web address]    Recommended Other Services MD signed initial eval    Consulted and Agree with Plan of Care Patient           Patient will benefit from skilled therapeutic intervention in order to improve the following deficits and impairments:  Decreased coordination,Decreased range of motion,Increased fascial restricitons,Decreased activity tolerance,Increased muscle spasms,Pain,Decreased strength,Decreased mobility  Visit Diagnosis: Muscle weakness (generalized)  Other lack of coordination  Chronic midline low back pain without sciatica  Bladder pain  Cramp and spasm     Problem List Patient Active Problem List   Diagnosis Date Noted  . 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 Thickened endometrium   . Cervical stenosis (uterine cervix)   . Vulvar intraepithelial neoplasia (VIN) grade 3   . Essential hypertension 08/05/2017  . Fungal infection of skin of abdomen 06/16/2017  . Flu-like symptoms 05/25/2016  . Head injury 11/05/2015  . Insomnia 11/05/2015  . Former cigarette smoker 11/05/2015  . Chronic low back pain without sciatica 02/18/2015  . Fall 01/14/2015  . Lower abdominal pain 10/08/2014  . Generalized anxiety disorder 06/04/2014  . Anxiety state 01/07/2014  . Panic attacks 12/22/2013  . Lumbosacral spondylosis without myelopathy 09/29/2013  . 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 Lumbar paraspinal muscle spasm 01/16/2013  . Reflux 12/13/2011  . Lower leg edema 12/10/2011  . Herpes simplex 10/07/2011  . Peripheral neuropathy 10/07/2011  . Hypercholesteremia 10/07/2011  . History of tuberculosis exposure 10/01/2011  . Recurrent UTI 08/06/2011  . HIV positive ([place]) 08/03/2011  . Sleep disorder 08/03/2011  . Back pain 07/21/2011  . Osteoporosis 03/09/2008    Earlie Counts, PT  05/01/20 3:47 PM   [facility] [address], [place], [place], [address] Phone: [phone]   Fax:  [phone]  Name: Shanty Ginty MRN: [id number] Date of Birth: May 18, 1960

## 2020-05-09 ENCOUNTER — Ambulatory Visit: Payer: Medicare HMO | Admitting: Physical Therapy

## 2020-05-16 ENCOUNTER — Encounter: Payer: Self-pay | Admitting: Physical Therapy

## 2020-05-16 ENCOUNTER — Ambulatory Visit: Payer: Medicare HMO | Attending: Gynecologic Oncology | Admitting: Physical Therapy

## 2020-05-16 ENCOUNTER — Other Ambulatory Visit: Payer: Self-pay

## 2020-05-16 DIAGNOSIS — M545 Low back pain, unspecified: Secondary | ICD-10-CM | POA: Diagnosis present

## 2020-05-16 DIAGNOSIS — R278 Other lack of coordination: Secondary | ICD-10-CM | POA: Diagnosis present

## 2020-05-16 DIAGNOSIS — R252 Cramp and spasm: Secondary | ICD-10-CM | POA: Diagnosis present

## 2020-05-16 DIAGNOSIS — M6281 Muscle weakness (generalized): Secondary | ICD-10-CM | POA: Diagnosis not present

## 2020-05-16 DIAGNOSIS — G8929 Other chronic pain: Secondary | ICD-10-CM | POA: Insufficient documentation

## 2020-05-16 DIAGNOSIS — R3989 Other symptoms and signs involving the genitourinary system: Secondary | ICD-10-CM | POA: Diagnosis present

## 2020-05-16 NOTE — Patient Instructions (Addendum)
About Abdominal Massage  Abdominal massage, also called external colon massage, is a self-treatment circular massage technique that can reduce and eliminate gas and ease constipation. The colon naturally contracts in waves in a clockwise direction starting from inside the right hip, moving up toward the ribs, across the belly, and down inside the left hip.  When you perform circular abdominal massage, you help stimulate your colon's normal wave pattern of movement called peristalsis.  It is most beneficial when done after eating.  Positioning You can practice abdominal massage with oil while lying down, or in the shower with soap.  Some people find that it is just as effective to do the massage through clothing while sitting or standing.  How to Massage Start by placing your finger tips or knuckles on your right side, just inside your hip bone.  . Make small circular movements while you move upward toward your rib cage.   . Once you reach the bottom right side of your rib cage, take your circular movements across to the left side of the bottom of your rib cage.  . Next, move downward until you reach the inside of your left hip bone.  This is the path your feces travel in your colon. . Continue to perform your abdominal massage in this pattern for 10 minutes each day.     You can apply as much pressure as is comfortable in your massage.  Start gently and build pressure as you continue to practice.  Notice any areas of pain as you massage; areas of slight pain may be relieved as you massage, but if you have areas of significant or intense pain, consult with your healthcare provider.  Other Considerations . General physical activity including bending and stretching can have a beneficial massage-like effect on the colon.  Deep breathing can also stimulate the colon because breathing deeply activates the same nervous system that supplies the colon.   . Abdominal massage should always be used in  combination with a bowel-conscious diet that is high in the proper type of fiber for you, fluids (primarily water), and a regular exercise program.  then find your pubic bone and pull up on the abdomen for 20 times  Then practice breathing into your belly down to your pelvic floor 10x 3 times per day  [facility] [address], [place], [place] [address] Phone # [phone] Fax [phone]

## 2020-05-16 NOTE — Therapy (Signed)
[facility] [address] [place], [place], [address] Phone: [phone]   Fax:  [phone]  Physical Therapy Treatment  Patient Details  Name: Courtney Hamilton MRN: [id number] Date of Birth: 1961-01-20 Referring Provider (PT): Dr. Jeral Pinch   Encounter Date: 05/16/2020   PT End of Session - 05/16/20 1012    Visit Number 3    Date for PT Re-Evaluation 07/03/20    Authorization Type medicare/medicaid    Authorization - Visit Number 3    Authorization - Number of Visits 10    PT Start Time [phone]   came late   PT Stop Time 1010    PT Time Calculation (min) 33 min    Activity Tolerance Patient tolerated treatment well;No increased pain    Behavior During Therapy WFL for tasks assessed/performed           Past Medical History:  Diagnosis Date  . Arthritis    right hip  . Bronchitis last time  sept/oct 2021  . Hepatitis 1980's   NON A NON B tx for 1980's resolved  . Herniated disc 10/07/2011   lower back  . History of chronic kidney disease 1980's   resolved  . HIV (human immunodeficiency virus infection) ([place]) 1985  . Hypertension   . Lower GI bleed 05/10/2012   pt denies  . Neuropathy    resolved  . Osteoporosis   . Ruptured lumbar disc   . Sciatica    right side  . Substance abuse ([place])    past hsitory  clean more than 20 years   . TB (pulmonary tuberculosis) 1993    exposure, treated   . VIN III (vulvar intraepithelial neoplasia III)     Past Surgical History:  Procedure Laterality Date  . APPENDECTOMY  1980's  . BREAST EXCISIONAL BIOPSY Left 2001 per pt   cyst removed   . CHOLECYSTECTOMY  1980's  . CO2 LASER APPLICATION N/A 81/27/5170   Procedure: CO2 LASER APPLICATION OF VULVA (PERI-CLITORAL AND PERI-ANAL);  Surgeon: Lafonda Mosses, MD;  Location: [facility];  Service: Gynecology;  Laterality: N/A;  . ECTOPIC PREGNANCY SURGERY  1985  . injections to lower back   09/2017  . LAPAROSCOPY N/A 07/03/2019   Procedure: LAPAROSCOPY DIAGNOSTIC; LYSIS OF ADHESIONS;  Surgeon: Lafonda Mosses, MD;  [facility];  Service: Gynecology;  Laterality: N/A;  . ROBOTIC ASSISTED TOTAL HYSTERECTOMY WITH BILATERAL SALPINGO OOPHERECTOMY N/A 07/03/2019   Procedure: XI ROBOTIC ASSISTED TOTAL HYSTERECTOMY WITH UNILATERAL SALPINGO OOPHORECTOMY;  Surgeon: Lafonda Mosses, MD;  Location: [facility];  Service: Gynecology;  Laterality: N/A;  . SALIVARY GLAND SURGERY  1990  . VULVECTOMY N/A 01/18/2018   Procedure: PARTIAL VULVECTOMY;  Surgeon: [name], MD;  Location: [facility];  Service: Gynecology;  Laterality: N/A;    There were no vitals filed for this visit.   Subjective Assessment - 05/16/20 0938    Subjective I was not able to come last week due to a death in the family. I had more pain due to not coming to therapy.    Patient Stated Goals reduce pain    Currently in Pain? Yes    Pain Score 5     Pain Location Other (Comment)   bladder   Pain Orientation Mid    Pain Descriptors / Indicators Sharp;Pressure    Pain Onset More than a month ago    Pain Frequency Intermittent    Aggravating Factors  urination, bowel  movement    Pain Relieving Factors wait it out    Multiple Pain Sites No                             [facility] Adult PT Treatment/Exercise - 05/16/20 0001      Neuro Re-ed    Neuro Re-ed Details  brelly breathing to expand the abdomen and feel the pelvic floor to drop but still needs verbal cues      Manual Therapy   Manual Therapy Soft tissue mobilization;Myofascial release    Manual therapy comments educated patient on abdominal massage to do at home    Soft tissue mobilization scar massage to the suprapubic area and the right lateral abdominal area; bilateral diaphragm manual work; circular massage to the abdomen    Myofascial Release myofascial release along the bladder, pubovesical ligaments and  bladder                  PT Education - 05/16/20 1009    Education Details abdominal massage; suprapubic massage and belly breathing    Person(s) Educated Patient    Methods Explanation;Demonstration;Verbal cues;Handout    Comprehension Returned demonstration;Verbalized understanding            PT Short Term Goals - 05/16/20 1015      PT SHORT TERM GOAL #1   Title independent with initial HEP with stretches and diaphragmatic breating    Time 4    Period Weeks    Status On-going      PT SHORT TERM GOAL #2   Title bladder pain with urination decreased >/= 25% due to restrictions of the bladder    Time 4    Period Weeks    Status On-going             PT Long Term Goals - 04/10/20 1206      PT LONG TERM GOAL #1   Title independent with advanced HEP for strengthening    Time 12    Period Weeks    Status New    Target Date 07/03/20      PT LONG TERM GOAL #2   Title bladder pain with urination decreased >/= 50% due to reduction of muscle spasms and the ability to relax the pelvic floor    Time 12    Period Weeks    Status New    Target Date 07/03/20      PT LONG TERM GOAL #3   Title able to walk to the commode slowly after she has a strong urge to urinate due to using behavioral techniques    Time 12    Period Weeks    Status New    Target Date 07/03/20      PT LONG TERM GOAL #4   Title able to diaphragmatically breath to bulge the pelvic floor and relax the bladder    Time 12    Period Weeks    Status New    Target Date 07/03/20                 Plan - 05/16/20 1013    Clinical Impression Statement Patient feels better when she is coming to therapy. Patient figured out her back pain is better when she sleeps on a particular mattress. Patient has increased fascial tightness around the bladder, suprapubically, and upper abdominal area. Patient is doing her stretches. Patient will benefit from skilled therapy to reduce her bladder pain and  improve pelvic floor function.    Personal Factors and Comorbidities Age;Fitness;Comorbidity 3+;Sex    Comorbidities HIV; Hysterectomy 07/03/2019; osteoporosis; Vulvectomy 01/18/2018    Examination-Activity Limitations Sit;Continence;Toileting;Stand    Examination-Participation Restrictions Interpersonal Relationship;Community Activity;Driving;Shop    Stability/Clinical Decision Making Evolving/Moderate complexity    Rehab Potential Good    PT Frequency 1x / week    PT Duration 12 weeks    PT Treatment/Interventions ADLs/Self Care Home Management;Biofeedback;Cryotherapy;Electrical Stimulation;Moist Heat;Ultrasound;Neuromuscular re-education;Therapeutic exercise;Therapeutic activities;Patient/family education;Manual techniques;Dry needling;Passive range of motion    PT Next Visit Plan work on lower rib cage mobility with breath, fascial restrictions release of the bladder area and internal work;  diaphragmatic breathing    PT Home Exercise Plan Access Code: [email or web address]    Consulted and Agree with Plan of Care Patient           Patient will benefit from skilled therapeutic intervention in order to improve the following deficits and impairments:  Decreased coordination,Decreased range of motion,Increased fascial restricitons,Decreased activity tolerance,Increased muscle spasms,Pain,Decreased strength,Decreased mobility  Visit Diagnosis: Muscle weakness (generalized)  Other lack of coordination  Chronic midline low back pain without sciatica  Bladder pain  Cramp and spasm     Problem List Patient Active Problem List   Diagnosis Date Noted  . 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 Thickened endometrium   . Cervical stenosis (uterine cervix)   . Vulvar intraepithelial neoplasia (VIN) grade 3   . Essential hypertension 08/05/2017  . Fungal infection of skin of abdomen 06/16/2017  . Flu-like symptoms 05/25/2016  . Head injury 11/05/2015  . Insomnia 11/05/2015  . Former cigarette  smoker 11/05/2015  . Chronic low back pain without sciatica 02/18/2015  . Fall 01/14/2015  . Lower abdominal pain 10/08/2014  . Generalized anxiety disorder 06/04/2014  . Anxiety state 01/07/2014  . Panic attacks 12/22/2013  . Lumbosacral spondylosis without myelopathy 09/29/2013  . 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 Lumbar paraspinal muscle spasm 01/16/2013  . Reflux 12/13/2011  . Lower leg edema 12/10/2011  . Herpes simplex 10/07/2011  . Peripheral neuropathy 10/07/2011  . Hypercholesteremia 10/07/2011  . History of tuberculosis exposure 10/01/2011  . Recurrent UTI 08/06/2011  . HIV positive ([place]) 08/03/2011  . Sleep disorder 08/03/2011  . Back pain 07/21/2011  . Osteoporosis 03/09/2008    Earlie Counts, PT 05/16/20 10:16 AM   [facility] [address], [place], [place], [address] Phone: [phone]   Fax:  [phone]  Name: Courtney Hamilton MRN: [id number] Date of Birth: 07-09-1960

## 2020-05-23 ENCOUNTER — Ambulatory Visit: Payer: Medicare HMO | Admitting: Physical Therapy

## 2020-05-27 MED FILL — DESCOVY 200-25 MG TABS: 200-25 | 30 days supply | Qty: 30 | Fill #2

## 2020-05-27 MED FILL — TIVICAY 50 MG TABLET: 50 | 30 days supply | Qty: 30 | Fill #2

## 2020-05-30 ENCOUNTER — Other Ambulatory Visit: Payer: Self-pay

## 2020-05-30 ENCOUNTER — Ambulatory Visit: Payer: Medicare HMO | Admitting: Physical Therapy

## 2020-05-30 ENCOUNTER — Encounter: Payer: Self-pay | Admitting: Physical Therapy

## 2020-05-30 DIAGNOSIS — R3989 Other symptoms and signs involving the genitourinary system: Secondary | ICD-10-CM

## 2020-05-30 DIAGNOSIS — R252 Cramp and spasm: Secondary | ICD-10-CM

## 2020-05-30 DIAGNOSIS — M6281 Muscle weakness (generalized): Secondary | ICD-10-CM

## 2020-05-30 DIAGNOSIS — G8929 Other chronic pain: Secondary | ICD-10-CM

## 2020-05-30 DIAGNOSIS — R278 Other lack of coordination: Secondary | ICD-10-CM

## 2020-05-30 NOTE — Therapy (Signed)
[facility] [address] [place], [place], [address] Phone: [phone]   Fax:  [phone]  Physical Therapy Treatment  Patient Details  Name: Courtney Hamilton MRN: [id number] Date of Birth: 07/18/1960 Referring Provider (PT): Dr. Jeral Pinch   Encounter Date: 05/30/2020   PT End of Session - 05/30/20 1057    Visit Number 4    Date for PT Re-Evaluation 07/03/20    Authorization Type medicare/medicaid    Authorization - Visit Number 4    Authorization - Number of Visits 10    PT Start Time 7867   came late   PT Stop Time 6720    PT Time Calculation (min) 27 min    Activity Tolerance Patient tolerated treatment well;No increased pain    Behavior During Therapy WFL for tasks assessed/performed           Past Medical History:  Diagnosis Date  . Arthritis    right hip  . Bronchitis last time  sept/oct 2021  . Hepatitis 1980's   NON A NON B tx for 1980's resolved  . Herniated disc 10/07/2011   lower back  . History of chronic kidney disease 1980's   resolved  . HIV (human immunodeficiency virus infection) ([place]) 1985  . Hypertension   . Lower GI bleed 05/10/2012   pt denies  . Neuropathy    resolved  . Osteoporosis   . Ruptured lumbar disc   . Sciatica    right side  . Substance abuse ([place])    past hsitory  clean more than 20 years   . TB (pulmonary tuberculosis) 1993    exposure, treated   . VIN III (vulvar intraepithelial neoplasia III)     Past Surgical History:  Procedure Laterality Date  . APPENDECTOMY  1980's  . BREAST EXCISIONAL BIOPSY Left 2001 per pt   cyst removed   . CHOLECYSTECTOMY  1980's  . CO2 LASER APPLICATION N/A 94/70/9628   Procedure: CO2 LASER APPLICATION OF VULVA (PERI-CLITORAL AND PERI-ANAL);  Surgeon: Lafonda Mosses, MD;  Location: [facility];  Service: Gynecology;  Laterality: N/A;  . ECTOPIC PREGNANCY SURGERY  1985  . injections to lower back   09/2017  . LAPAROSCOPY N/A 07/03/2019   Procedure: LAPAROSCOPY DIAGNOSTIC; LYSIS OF ADHESIONS;  Surgeon: Lafonda Mosses, MD;  Location: [facility];  Service: Gynecology;  Laterality: N/A;  . ROBOTIC ASSISTED TOTAL HYSTERECTOMY WITH BILATERAL SALPINGO OOPHERECTOMY N/A 07/03/2019   Procedure: XI ROBOTIC ASSISTED TOTAL HYSTERECTOMY WITH UNILATERAL SALPINGO OOPHORECTOMY;  Surgeon: Lafonda Mosses, MD;  Location: [facility];  Service: Gynecology;  Laterality: N/A;  . SALIVARY GLAND SURGERY  1990  . VULVECTOMY N/A 01/18/2018   Procedure: PARTIAL VULVECTOMY;  Surgeon: [name], MD;  Location: [facility];  Service: Gynecology;  Laterality: N/A;    There were no vitals filed for this visit.                   Pelvic Floor Special Questions - 05/30/20 0001    Pelvic Floor Internal Exam Patient confirms identification and approves PT to assess pelvic floor and treatment    Exam Type Vaginal    Palpation tihgtnes in the perineal body, and right superficial transverse    Strength fair squeeze, definite lift   good circular contraction and lift            [facility] Adult PT Treatment/Exercise - 05/30/20 0001      Self-Care   Self-Care  Other Self-Care Comments    Other Self-Care Comments  educated patient on the urge to void so she is able to get to the bathroom without leaking urine      Neuro Re-ed    Neuro Re-ed Details  diaphragmatic breathing to expand the lower rib cage and then into the abdomen to relax the pelvic floor; pelvic floor contraction for 5 seconds with therapist finger in the vaginal canal to assess the full contraction 10x      Manual Therapy   Manual Therapy Internal Pelvic Floor    Internal Pelvic Floor manual work to the perineal body and right superfical transverse with good tissue elongation                  PT Education - 05/30/20 1056    Education Details urge to void    Methods Explanation;Handout;Demonstration     Comprehension Verbalized understanding;Returned demonstration            PT Short Term Goals - 05/30/20 1029      PT SHORT TERM GOAL #1   Title independent with initial HEP with stretches and diaphragmatic breating    Time 4    Period Weeks    Status Achieved      PT SHORT TERM GOAL #2   Title bladder pain with urination decreased >/= 25% due to restrictions of the bladder    Time 4    Period Weeks    Status Achieved             PT Long Term Goals - 05/30/20 1100      PT LONG TERM GOAL #1   Title independent with advanced HEP for strengthening    Time 12    Period Weeks    Status On-going      PT LONG TERM GOAL #2   Title bladder pain with urination decreased >/= 50% due to reduction of muscle spasms and the ability to relax the pelvic floor    Baseline 70% better    Time 12    Period Weeks    Status Achieved      PT LONG TERM GOAL #3   Title able to walk to the commode slowly after she has a strong urge to urinate due to using behavioral techniques    Time 12    Period Weeks    Status On-going      PT LONG TERM GOAL #4   Title able to diaphragmatically breath to bulge the pelvic floor and relax the bladder    Time 12    Period Weeks    Status On-going                 Plan - 05/30/20 1028    Clinical Impression Statement Patient reports her bladder pain while urinating is 70% better. Patient is able to do the diaphragmatic breathing in therapy but has trouble while at home. Patient pelvic floor strength is 3/5 with good circular contraction. Patient had some tissue restrictions in the perineal body and right superfical transverse Patient reports she still leaks as she is walking to the bathroom when she has the urge. She was educated on how to deter the urge to void. Patient will benefit from skilled therapy to reduce her bladder pain and improve pelvic floor coordination to reduce leakage.    Personal Factors and Comorbidities Age;Fitness;Comorbidity  3+;Sex    Comorbidities HIV; Hysterectomy 07/03/2019; osteoporosis; Vulvectomy 01/18/2018    Examination-Activity Limitations Sit;Continence;Toileting;Stand  Examination-Participation Restrictions Interpersonal Relationship;Community Activity;Driving;Shop    Stability/Clinical Decision Making Evolving/Moderate complexity    Rehab Potential Good    PT Frequency 1x / week    PT Duration 12 weeks    PT Treatment/Interventions ADLs/Self Care Home Management;Biofeedback;Cryotherapy;Electrical Stimulation;Moist Heat;Ultrasound;Neuromuscular re-education;Therapeutic exercise;Therapeutic activities;Patient/family education;Manual techniques;Dry needling;Passive range of motion    PT Next Visit Plan pelvic floor strength with core; urge to void to review    PT Home Exercise Plan Access Code: [email or web address]    Consulted and Agree with Plan of Care Patient           Patient will benefit from skilled therapeutic intervention in order to improve the following deficits and impairments:  Decreased coordination,Decreased range of motion,Increased fascial restricitons,Decreased activity tolerance,Increased muscle spasms,Pain,Decreased strength,Decreased mobility  Visit Diagnosis: Muscle weakness (generalized)  Other lack of coordination  Chronic midline low back pain without sciatica  Bladder pain  Cramp and spasm     Problem List Patient Active Problem List   Diagnosis Date Noted  . 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 Thickened endometrium   . Cervical stenosis (uterine cervix)   . Vulvar intraepithelial neoplasia (VIN) grade 3   . Essential hypertension 08/05/2017  . Fungal infection of skin of abdomen 06/16/2017  . Flu-like symptoms 05/25/2016  . Head injury 11/05/2015  . Insomnia 11/05/2015  . Former cigarette smoker 11/05/2015  . Chronic low back pain without sciatica 02/18/2015  . Fall 01/14/2015  . Lower abdominal pain 10/08/2014  . Generalized anxiety disorder 06/04/2014  .  Anxiety state 01/07/2014  . Panic attacks 12/22/2013  . Lumbosacral spondylosis without myelopathy 09/29/2013  . 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 Lumbar paraspinal muscle spasm 01/16/2013  . Reflux 12/13/2011  . Lower leg edema 12/10/2011  . Herpes simplex 10/07/2011  . Peripheral neuropathy 10/07/2011  . Hypercholesteremia 10/07/2011  . History of tuberculosis exposure 10/01/2011  . Recurrent UTI 08/06/2011  . HIV positive ([place]) 08/03/2011  . Sleep disorder 08/03/2011  . Back pain 07/21/2011  . Osteoporosis 03/09/2008    Earlie Counts, PT 05/30/20 11:02 AM    [facility] [address] [place], [place], [address] Phone: [phone]   Fax:  [phone]  Name: Courtney Hamilton MRN: [id number] Date of Birth: Nov 05, 1960

## 2020-06-04 ENCOUNTER — Other Ambulatory Visit (HOSPITAL_COMMUNITY): Payer: Self-pay

## 2020-06-06 ENCOUNTER — Telehealth: Payer: Self-pay | Admitting: Physical Therapy

## 2020-06-06 ENCOUNTER — Ambulatory Visit: Payer: Medicare HMO | Admitting: Physical Therapy

## 2020-06-06 NOTE — Telephone Encounter (Signed)
Called patient about her missed appointment today at 11:00. Unable to leave a message due to mailbox is full.  Earlie Counts, PT @3 /31/2021@ 11:19 AM

## 2020-06-13 ENCOUNTER — Encounter: Payer: Medicare HMO | Admitting: Physical Therapy

## 2020-06-20 ENCOUNTER — Ambulatory Visit: Payer: Medicare HMO | Attending: Gynecologic Oncology | Admitting: Physical Therapy

## 2020-06-20 ENCOUNTER — Other Ambulatory Visit (HOSPITAL_COMMUNITY): Payer: Self-pay

## 2020-06-20 ENCOUNTER — Other Ambulatory Visit: Payer: Self-pay

## 2020-06-20 ENCOUNTER — Encounter: Payer: Self-pay | Admitting: Physical Therapy

## 2020-06-20 DIAGNOSIS — M6281 Muscle weakness (generalized): Secondary | ICD-10-CM | POA: Diagnosis present

## 2020-06-20 DIAGNOSIS — G8929 Other chronic pain: Secondary | ICD-10-CM | POA: Insufficient documentation

## 2020-06-20 DIAGNOSIS — R278 Other lack of coordination: Secondary | ICD-10-CM | POA: Diagnosis present

## 2020-06-20 DIAGNOSIS — R3989 Other symptoms and signs involving the genitourinary system: Secondary | ICD-10-CM

## 2020-06-20 DIAGNOSIS — R252 Cramp and spasm: Secondary | ICD-10-CM | POA: Diagnosis present

## 2020-06-20 DIAGNOSIS — M545 Low back pain, unspecified: Secondary | ICD-10-CM | POA: Diagnosis present

## 2020-06-20 MED FILL — Emtricitabine-Tenofovir Alafenamide Fumarate Tab 200-25 MG: ORAL | 30 days supply | Qty: 30 | Fill #0 | Status: AC

## 2020-06-20 MED FILL — Dolutegravir Sodium Tab 50 MG (Base Equiv): ORAL | 30 days supply | Qty: 30 | Fill #0 | Status: AC

## 2020-06-20 NOTE — Therapy (Signed)
[facility] [address], [place], [place], [address] Phone: [phone]   Fax:  [phone]  Physical Therapy Treatment  Patient Details  Name: Courtney Hamilton MRN: [id number] Date of Birth: Aug 09, 1960 Referring Provider (PT): Dr. Jeral Pinch   Encounter Date: 06/20/2020   PT End of Session - 06/20/20 1117    Visit Number 5    Date for PT Re-Evaluation 07/03/20    Authorization Type medicare/medicaid    Authorization - Visit Number 5    Authorization - Number of Visits 10    PT Start Time 1100    PT Stop Time 7510    PT Time Calculation (min) 38 min    Activity Tolerance Patient tolerated treatment well;No increased pain    Behavior During Therapy WFL for tasks assessed/performed           Past Medical History:  Diagnosis Date  . Arthritis    right hip  . Bronchitis last time  sept/oct 2021  . Hepatitis 1980's   NON A NON B tx for 1980's resolved  . Herniated disc 10/07/2011   lower back  . History of chronic kidney disease 1980's   resolved  . HIV (human immunodeficiency virus infection) ([place]) 1985  . Hypertension   . Lower GI bleed 05/10/2012   pt denies  . Neuropathy    resolved  . Osteoporosis   . Ruptured lumbar disc   . Sciatica    right side  . Substance abuse ([place])    past hsitory  clean more than 20 years   . TB (pulmonary tuberculosis) 1993    exposure, treated   . VIN III (vulvar intraepithelial neoplasia III)     Past Surgical History:  Procedure Laterality Date  . APPENDECTOMY  1980's  . BREAST EXCISIONAL BIOPSY Left 2001 per pt   cyst removed   . CHOLECYSTECTOMY  1980's  . CO2 LASER APPLICATION N/A 25/85/2778   Procedure: CO2 LASER APPLICATION OF VULVA (PERI-CLITORAL AND PERI-ANAL);  Surgeon: Lafonda Mosses, MD;  Location: [facility];  Service: Gynecology;  Laterality: N/A;  . ECTOPIC PREGNANCY SURGERY  1985  . injections to lower back  09/2017  .  LAPAROSCOPY N/A 07/03/2019   Procedure: LAPAROSCOPY DIAGNOSTIC; LYSIS OF ADHESIONS;  Surgeon: Lafonda Mosses, MD;  Location: [facility];  Service: Gynecology;  Laterality: N/A;  . ROBOTIC ASSISTED TOTAL HYSTERECTOMY WITH BILATERAL SALPINGO OOPHERECTOMY N/A 07/03/2019   Procedure: XI ROBOTIC ASSISTED TOTAL HYSTERECTOMY WITH UNILATERAL SALPINGO OOPHORECTOMY;  Surgeon: Lafonda Mosses, MD;  Location: [facility];  Service: Gynecology;  Laterality: N/A;  . SALIVARY GLAND SURGERY  1990  . VULVECTOMY N/A 01/18/2018   Procedure: PARTIAL VULVECTOMY;  Surgeon: [name], MD;  Location: [facility];  Service: Gynecology;  Laterality: N/A;    There were no vitals filed for this visit.   Subjective Assessment - 06/20/20 1105    Subjective Patient has feeling pressure and pain due to lifting. I have been practicing the urge to void. I am able to control the urge better.    Patient Stated Goals reduce pain    Currently in Pain? Yes    Pain Score 4     Pain Location Other (Comment)   bladder   Pain Orientation Mid    Pain Descriptors / Indicators Pressure;Sharp    Pain Type Acute pain    Pain Onset More than a month ago    Pain Frequency Intermittent    Aggravating  Factors  urination, bowel movement    Pain Relieving Factors wait it out    Multiple Pain Sites Yes    Pain Location Back    Pain Orientation Lower    Pain Descriptors / Indicators Sharp    Pain Type Chronic pain    Pain Onset More than a month ago    Pain Frequency Intermittent    Aggravating Factors  end of the day, sit with legs stretched out    Pain Relieving Factors ice, not standing more than 10 minutes                          Pelvic Floor Special Questions - 06/20/20 0001    Pelvic Floor Internal Exam Patient confirms identification and approves PT to assess pelvic floor and treatment    Exam Type Vaginal             [facility] Adult PT Treatment/Exercise - 06/20/20 0001       Lumbar Exercises: Supine   Other Supine Lumbar Exercises supine with feel on wedge performing diaphragmatic breathing to relax the pelvic floor then massage from the pubic bone upward to umbilicus      Manual Therapy   Manual Therapy Internal Pelvic Floor    Internal Pelvic Floor manual work to the perineal body and right superfical transverse with good tissue elongation; fascial release of the sides of the bladder and urethra                  PT Education - 06/20/20 1137    Education Details laying down on back with legs elevated and massage the  suprapubic area to reduce tension on the bladder    Person(s) Educated Patient    Methods Explanation;Demonstration    Comprehension Verbalized understanding;Returned demonstration            PT Short Term Goals - 05/30/20 1029      PT SHORT TERM GOAL #1   Title independent with initial HEP with stretches and diaphragmatic breating    Time 4    Period Weeks    Status Achieved      PT SHORT TERM GOAL #2   Title bladder pain with urination decreased >/= 25% due to restrictions of the bladder    Time 4    Period Weeks    Status Achieved             PT Long Term Goals - 06/20/20 1109      PT LONG TERM GOAL #2   Title bladder pain with urination decreased >/= 50% due to reduction of muscle spasms and the ability to relax the pelvic floor    Baseline 70% better    Time 12    Period Weeks    Status Achieved      PT LONG TERM GOAL #3   Title able to walk to the commode slowly after she has a strong urge to urinate due to using behavioral techniques    Time 12    Period Weeks    Status On-going      PT LONG TERM GOAL #4   Title able to diaphragmatically breath to bulge the pelvic floor and relax the bladder    Time 12    Period Weeks    Status On-going                 Plan - 06/20/20 1138    Clinical Impression Statement Patient has  done increased lifting due to moving so she has increased bladder pain.  Patient was education on how to lay with her legs elevated and massage the suprapubic area. Patient pelvic floor strength si 3/5. She is able to do a circular contraction and lift. Patient has improved mobility of the perineal body. Patient has fascial restrictions on the sides of the bladder. Patient is now able to hold her urine better when she has a strong urge. Patient will benefit from skilled therapy to improve pelvic floor coordination to reduce leakage.    Personal Factors and Comorbidities Age;Fitness;Comorbidity 3+;Sex    Comorbidities HIV; Hysterectomy 07/03/2019; osteoporosis; Vulvectomy 01/18/2018    Examination-Activity Limitations Sit;Continence;Toileting;Stand    Examination-Participation Restrictions Interpersonal Relationship;Community Activity;Driving;Shop    Stability/Clinical Decision Making Evolving/Moderate complexity    Rehab Potential Good    PT Frequency 1x / week    PT Duration 12 weeks    PT Treatment/Interventions ADLs/Self Care Home Management;Biofeedback;Cryotherapy;Electrical Stimulation;Moist Heat;Ultrasound;Neuromuscular re-education;Therapeutic exercise;Therapeutic activities;Patient/family education;Manual techniques;Dry needling;Passive range of motion    PT Next Visit Plan check to see if continue or discharge, go over lifting to reduce strain on the pelvic floor    PT Home Exercise Plan Access Code: [email or web address]    Consulted and Agree with Plan of Care Patient           Patient will benefit from skilled therapeutic intervention in order to improve the following deficits and impairments:  Decreased coordination,Decreased range of motion,Increased fascial restricitons,Decreased activity tolerance,Increased muscle spasms,Pain,Decreased strength,Decreased mobility  Visit Diagnosis: Muscle weakness (generalized)  Other lack of coordination  Chronic midline low back pain without sciatica  Bladder pain  Cramp and spasm     Problem List Patient Active  Problem List   Diagnosis Date Noted  . 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 Thickened endometrium   . Cervical stenosis (uterine cervix)   . Vulvar intraepithelial neoplasia (VIN) grade 3   . Essential hypertension 08/05/2017  . Fungal infection of skin of abdomen 06/16/2017  . Flu-like symptoms 05/25/2016  . Head injury 11/05/2015  . Insomnia 11/05/2015  . Former cigarette smoker 11/05/2015  . Chronic low back pain without sciatica 02/18/2015  . Fall 01/14/2015  . Lower abdominal pain 10/08/2014  . Generalized anxiety disorder 06/04/2014  . Anxiety state 01/07/2014  . Panic attacks 12/22/2013  . Lumbosacral spondylosis without myelopathy 09/29/2013  . 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 Lumbar paraspinal muscle spasm 01/16/2013  . Reflux 12/13/2011  . Lower leg edema 12/10/2011  . Herpes simplex 10/07/2011  . Peripheral neuropathy 10/07/2011  . Hypercholesteremia 10/07/2011  . History of tuberculosis exposure 10/01/2011  . Recurrent UTI 08/06/2011  . HIV positive ([place]) 08/03/2011  . Sleep disorder 08/03/2011  . Back pain 07/21/2011  . Osteoporosis 03/09/2008    Earlie Counts, PT 06/20/20 11:43 AM   [facility] [address] [place], [place], [address] Phone: [phone]   Fax:  [phone]  Name: Courtney Hamilton MRN: [id number] Date of Birth: January 24, 1961

## 2020-06-25 ENCOUNTER — Other Ambulatory Visit (HOSPITAL_COMMUNITY): Payer: Self-pay

## 2020-06-27 ENCOUNTER — Ambulatory Visit: Payer: Medicare HMO | Admitting: Physical Therapy

## 2020-06-27 ENCOUNTER — Other Ambulatory Visit: Payer: Self-pay

## 2020-06-27 ENCOUNTER — Encounter: Payer: Self-pay | Admitting: Physical Therapy

## 2020-06-27 DIAGNOSIS — R252 Cramp and spasm: Secondary | ICD-10-CM

## 2020-06-27 DIAGNOSIS — R3989 Other symptoms and signs involving the genitourinary system: Secondary | ICD-10-CM

## 2020-06-27 DIAGNOSIS — G8929 Other chronic pain: Secondary | ICD-10-CM

## 2020-06-27 DIAGNOSIS — R278 Other lack of coordination: Secondary | ICD-10-CM

## 2020-06-27 DIAGNOSIS — M6281 Muscle weakness (generalized): Secondary | ICD-10-CM

## 2020-06-27 DIAGNOSIS — M545 Low back pain, unspecified: Secondary | ICD-10-CM

## 2020-06-27 NOTE — Therapy (Signed)
[facility] [address] [place], [place], [address] Phone: [phone]   Fax:  [phone]  Physical Therapy Treatment  Patient Details  Name: Courtney Hamilton MRN: [id number] Date of Birth: [birth year] Referring Provider (PT): Dr. Jeral Pinch   Encounter Date: 06/27/2020   PT End of Session - 06/27/20 1355    Visit Number 6    Date for PT Re-Evaluation 09/25/20    Authorization Type medicare/medicaid    Authorization - Visit Number 6    Authorization - Number of Visits 10    PT Start Time 1100    PT Stop Time 8003    PT Time Calculation (min) 43 min    Activity Tolerance Patient tolerated treatment well;No increased pain    Behavior During Therapy WFL for tasks assessed/performed           Past Medical History:  Diagnosis Date  . Arthritis    right hip  . Bronchitis last time  sept/oct 2021  . Hepatitis 1980's   NON A NON B tx for 1980's resolved  . Herniated disc 10/07/2011   lower back  . History of chronic kidney disease 1980's   resolved  . HIV (human immunodeficiency virus infection) ([place]) 1985  . Hypertension   . Lower GI bleed 05/10/2012   pt denies  . Neuropathy    resolved  . Osteoporosis   . Ruptured lumbar disc   . Sciatica    right side  . Substance abuse ([place])    past hsitory  clean more than 20 years   . TB (pulmonary tuberculosis) 1993    exposure, treated   . VIN III (vulvar intraepithelial neoplasia III)     Past Surgical History:  Procedure Laterality Date  . APPENDECTOMY  1980's  . BREAST EXCISIONAL BIOPSY Left 2001 per pt   cyst removed   . CHOLECYSTECTOMY  1980's  . CO2 LASER APPLICATION N/A 49/17/9150   Procedure: CO2 LASER APPLICATION OF VULVA (PERI-CLITORAL AND PERI-ANAL);  Surgeon: Lafonda Mosses, MD;  Location: [facility];  Service: Gynecology;  Laterality: N/A;  . ECTOPIC PREGNANCY SURGERY  1985  . injections to lower back  09/2017  .  LAPAROSCOPY N/A 07/03/2019   Procedure: LAPAROSCOPY DIAGNOSTIC; LYSIS OF ADHESIONS;  Surgeon: Lafonda Mosses, MD;  Location: [facility];  Service: Gynecology;  Laterality: N/A;  . ROBOTIC ASSISTED TOTAL HYSTERECTOMY WITH BILATERAL SALPINGO OOPHERECTOMY N/A 07/03/2019   Procedure: XI ROBOTIC ASSISTED TOTAL HYSTERECTOMY WITH UNILATERAL SALPINGO OOPHORECTOMY;  Surgeon: Lafonda Mosses, MD;  Location: [facility];  Service: Gynecology;  Laterality: N/A;  . SALIVARY GLAND SURGERY  1990  . VULVECTOMY N/A 01/18/2018   Procedure: PARTIAL VULVECTOMY;  Surgeon: [name], MD;  Location: [facility];  Service: Gynecology;  Laterality: N/A;    There were no vitals filed for this visit.   Subjective Assessment - 06/27/20 1111    Subjective since you did the internal last week it helped with the pain. No pain now and if she does it is not as intense. Able to play soccer with my grand kids.    Patient Stated Goals reduce pain    Currently in Pain? Yes    Pain Score 2     Pain Location Other (Comment)   bladder   Pain Orientation Mid    Pain Descriptors / Indicators Pressure;Sharp    Pain Type Acute pain    Pain Onset More than a month ago  Pain Frequency Intermittent    Aggravating Factors  urination at the end    Multiple Pain Sites Yes    Pain Score 6    Pain Location Back    Pain Orientation Lower    Pain Descriptors / Indicators Aching    Pain Type Chronic pain    Pain Onset More than a month ago    Pain Frequency Intermittent    Aggravating Factors  when over extend herself, on her feet too much, becareful of lifting    Pain Relieving Factors ice, changed mattress              [facility] PT Assessment - 06/27/20 0001      Assessment   Medical Diagnosis R39.89 Bladder pain    Referring Provider (PT) Dr. Jeral Pinch    Onset Date/Surgical Date --   07/03/2019   Prior Therapy none      Precautions   Precautions Other (comment)    Precaution Comments HIV;  osteoporosis      Restrictions   Weight Bearing Restrictions No      Home Environment   Living Environment Private residence      Prior Function   Level of Independence Independent      Cognition   Overall Cognitive Status Within Functional Limits for tasks assessed      Posture/Postural Control   Posture/Postural Control Postural limitations    Postural Limitations Decreased lumbar lordosis;Posterior pelvic tilt      PROM   Right Hip Flexion 118    Right Hip External Rotation  55    Left Hip Flexion 120    Left Hip External Rotation  55      Strength   Right Hip Extension 3+/5    Right Hip ABduction 3+/5    Left Hip Extension 4/5    Left Hip ABduction 4-/5      Palpation   Palpation comment slight tenderness located in lumbar, some tenderness located in the abdominals                      Pelvic Floor Special Questions - 06/27/20 0001    Number of Pregnancies 1    Number of Vaginal Deliveries 1   needed a pessary to hold uterus in place   Currently Sexually Active Yes    Is this Painful Yes   deep and initial penetration   Urinary Leakage Yes    Pad use no    Activities that cause leaking With strong urge    Urinary urgency Yes    Fecal incontinence No    Palpation tihgtnes in the perineal body, and right superficial transverse    Strength fair squeeze, definite lift             [facility] Adult PT Treatment/Exercise - 06/27/20 0001      Manual Therapy   Manual Therapy Soft tissue mobilization;Myofascial release    Soft tissue mobilization scar massage to the suprapubic area and the right lateral abdominal area; bilateral diaphragm manual work;    Myofascial Release tissue rolling of the abdomen, release of the upper adomen to bring structures downward and release the diaphragm                    PT Short Term Goals - 06/27/20 1117      PT SHORT TERM GOAL #1   Title independent with initial HEP with stretches and diaphragmatic breating     Time 4  Period Weeks    Status Achieved      PT SHORT TERM GOAL #2   Title bladder pain with urination decreased >/= 25% due to restrictions of the bladder    Time 4    Period Weeks    Status Achieved             PT Long Term Goals - 06/27/20 1117      PT LONG TERM GOAL #1   Title independent with advanced HEP for strengthening    Time 12    Period Weeks    Status On-going      PT LONG TERM GOAL #2   Title bladder pain with urination decreased >/= 50% due to reduction of muscle spasms and the ability to relax the pelvic floor    Baseline 75% better    Time 12    Period Weeks    Status Achieved      PT LONG TERM GOAL #3   Title able to walk to the commode slowly after she has a strong urge to urinate due to using behavioral techniques    Baseline has ot mastered it yet    Time 12    Period Weeks    Status On-going      PT LONG TERM GOAL #4   Title able to diaphragmatically breath to bulge the pelvic floor and relax the bladder    Time 12    Period Weeks    Status On-going                 Plan - 06/27/20 1357    Clinical Impression Statement Patient pelvic floor strength has increased to 3/5. Patient reports her urgency is 50% better. Patient urinary leakage is 75% better. Patient back pain has decreased from 10/10 to 6/10. Patient has increased strength and ROM of bilateral hips. She has not  had penile penetration vaginally to see if there is pain. Patient continues to have tightness in the abdomen and pelvic floor. Patient is still working on the urge to void to control her leakage until she gets to the commode. Patient will benefit from skilled therapy to improve pelvic floor coordination to reduce her leakage.    Personal Factors and Comorbidities Age;Fitness;Comorbidity 3+;Sex    Comorbidities HIV; Hysterectomy 07/03/2019; osteoporosis; Vulvectomy 01/18/2018    Examination-Activity Limitations Sit;Continence;Toileting;Stand     Examination-Participation Restrictions Interpersonal Relationship;Community Activity;Driving;Shop    Stability/Clinical Decision Making Evolving/Moderate complexity           Patient will benefit from skilled therapeutic intervention in order to improve the following deficits and impairments:  Decreased coordination,Decreased range of motion,Increased fascial restricitons,Decreased activity tolerance,Increased muscle spasms,Pain,Decreased strength,Decreased mobility  Visit Diagnosis: Muscle weakness (generalized) - Plan: PT plan of care cert/re-cert  Other lack of coordination - Plan: PT plan of care cert/re-cert  Chronic midline low back pain without sciatica - Plan: PT plan of care cert/re-cert  Bladder pain - Plan: PT plan of care cert/re-cert  Cramp and spasm - Plan: PT plan of care cert/re-cert     Problem List Patient Active Problem List   Diagnosis Date Noted  . 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 Thickened endometrium   . Cervical stenosis (uterine cervix)   . Vulvar intraepithelial neoplasia (VIN) grade 3   . Essential hypertension 08/05/2017  . Fungal infection of skin of abdomen 06/16/2017  . Flu-like symptoms 05/25/2016  . Head injury 11/05/2015  . Insomnia 11/05/2015  . Former cigarette smoker 11/05/2015  .  Chronic low back pain without sciatica 02/18/2015  . Fall 01/14/2015  . Lower abdominal pain 10/08/2014  . Generalized anxiety disorder 06/04/2014  . Anxiety state 01/07/2014  . Panic attacks 12/22/2013  . Lumbosacral spondylosis without myelopathy 09/29/2013  . 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 Lumbar paraspinal muscle spasm 01/16/2013  . Reflux 12/13/2011  . Lower leg edema 12/10/2011  . Herpes simplex 10/07/2011  . Peripheral neuropathy 10/07/2011  . Hypercholesteremia 10/07/2011  . History of tuberculosis exposure 10/01/2011  . Recurrent UTI 08/06/2011  . HIV positive ([place]) 08/03/2011  . Sleep disorder 08/03/2011  . Back  pain 07/21/2011  . Osteoporosis 03/09/2008    Earlie Counts, PT 06/27/20 2:02 PM   [facility] [address], [place], [place], [address] Phone: [phone]   Fax:  [phone]  Name: Courtney Hamilton MRN: [id number] Date of Birth: [birth year]

## 2020-07-04 ENCOUNTER — Ambulatory Visit: Payer: Medicare HMO | Admitting: Physical Therapy

## 2020-07-18 ENCOUNTER — Other Ambulatory Visit (HOSPITAL_COMMUNITY): Payer: Self-pay

## 2020-07-22 ENCOUNTER — Other Ambulatory Visit (HOSPITAL_COMMUNITY): Payer: Self-pay

## 2020-07-22 MED FILL — Emtricitabine-Tenofovir Alafenamide Fumarate Tab 200-25 MG: ORAL | 30 days supply | Qty: 30 | Fill #1 | Status: AC

## 2020-07-22 MED FILL — Dolutegravir Sodium Tab 50 MG (Base Equiv): ORAL | 30 days supply | Qty: 30 | Fill #1 | Status: AC

## 2020-07-24 ENCOUNTER — Ambulatory Visit: Payer: Medicare HMO | Admitting: Physical Therapy

## 2020-07-27 ENCOUNTER — Other Ambulatory Visit (HOSPITAL_COMMUNITY): Payer: Self-pay

## 2020-07-29 ENCOUNTER — Other Ambulatory Visit (HOSPITAL_COMMUNITY): Payer: Self-pay

## 2020-07-29 ENCOUNTER — Ambulatory Visit: Payer: Medicare HMO | Admitting: Internal Medicine

## 2020-07-30 ENCOUNTER — Other Ambulatory Visit (HOSPITAL_COMMUNITY): Payer: Self-pay

## 2020-08-08 ENCOUNTER — Encounter: Payer: Self-pay | Admitting: Internal Medicine

## 2020-08-08 ENCOUNTER — Other Ambulatory Visit: Payer: Self-pay

## 2020-08-08 ENCOUNTER — Ambulatory Visit (INDEPENDENT_AMBULATORY_CARE_PROVIDER_SITE_OTHER): Payer: Medicare HMO | Admitting: Internal Medicine

## 2020-08-08 VITALS — BP 112/68 | HR 59 | Temp 97.9°F | Ht 67.0 in | Wt 174.0 lb

## 2020-08-08 DIAGNOSIS — Z79899 Other long term (current) drug therapy: Secondary | ICD-10-CM

## 2020-08-08 DIAGNOSIS — B20 Human immunodeficiency virus [HIV] disease: Secondary | ICD-10-CM | POA: Diagnosis not present

## 2020-08-08 DIAGNOSIS — E559 Vitamin D deficiency, unspecified: Secondary | ICD-10-CM | POA: Diagnosis not present

## 2020-08-08 DIAGNOSIS — L989 Disorder of the skin and subcutaneous tissue, unspecified: Secondary | ICD-10-CM

## 2020-08-08 NOTE — Progress Notes (Signed)
RFV: follow up for hiv disease  Patient ID: Courtney Hamilton, female   DOB: 11-16-1960, 60 y.o.   MRN: [id number]  HPI Courtney Hamilton is a 60yo F who has well controlled hiv disease.   #5 - weight loss since 8 months in our records but she report that she thinks it is more based on scales from [facility]. She notices that she is stiff in the morning but improves throughout the day  Noticing small change in diet (not as healthy, since her blender broke and recently moved to single level home, closer to her kids/grandkids)  Had colonoscopy in march -- does not recall report of when her next colonoscopy is due.  Has mammo and pap due this year  She is undergoing pelvic physical therapy (initially for incontinence and low back pain improvement)  Her husband is away 3 weeks a month since he drives trucks.  Outpatient Encounter Medications as of 08/08/2020  Medication Sig  . acyclovir ointment (ZOVIRAX) 5 % Apply 1 application topically every 3 (three) hours. (Patient taking differently: Apply 1 application topically every 3 (three) hours as needed (irritation.).)  . Ascorbic Acid (VITAMIN C) 1000 MG tablet Take 1,000 mg by mouth at bedtime.  . Biotin 1000 MCG tablet Take 2,000 mcg by mouth daily in the afternoon.  . dolutegravir (TIVICAY) 50 MG tablet TAKE 1 TABLET (50 MG TOTAL) BY MOUTH DAILY.  Marland Kitchen emtricitabine-tenofovir AF (DESCOVY) 200-25 MG tablet TAKE 1 TABLET BY MOUTH DAILY.  Marland Kitchen gabapentin (NEURONTIN) 300 MG capsule TAKE 1 CAPSULE BY MOUTH THREE TIMES A DAY (Patient taking differently: Take 600 mg by mouth at bedtime.)  . ibuprofen (ADVIL) 800 MG tablet Take 1 tablet (800 mg total) by mouth every 8 (eight) hours as needed for moderate pain. For AFTER surgery only  . losartan (COZAAR) 50 MG tablet Take 50 mg by mouth daily.  . Multiple Minerals-Vitamins (CAL-MAG ZINC II PO) Take 1 tablet by mouth daily in the afternoon.  . Omega-3 Fatty Acids (FISH OIL PO) Take 2,000 mg by mouth daily as  needed (hot flashes.).  Marland Kitchen senna-docusate (SENOKOT-S) 8.6-50 MG tablet Take 2 tablets by mouth at bedtime. For AFTER surgery, do not take if having diarrhea  . traMADol (ULTRAM) 50 MG tablet Take 2 tablets (100 mg total) by mouth 3 (three) times daily as needed for moderate pain.  Marland Kitchen conjugated estrogens (PREMARIN) vaginal cream Place 1 Applicatorful vaginally 3 (three) times a week. (Patient not taking: Reported on 08/08/2020)  . traMADol HCl 100 MG TABS Take by mouth.  . zolpidem (AMBIEN) 10 MG tablet Take 1 tablet (10 mg total) by mouth at bedtime as needed for up to 20 days for sleep.   Facility-Administered Encounter Medications as of 08/08/2020  Medication  . acyclovir ointment (ZOVIRAX) 5 %     Patient Active Problem List   Diagnosis Date Noted  . 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 Thickened endometrium   . Cervical stenosis (uterine cervix)   . Vulvar intraepithelial neoplasia (VIN) grade 3   . Essential hypertension 08/05/2017  . Fungal infection of skin of abdomen 06/16/2017  . Flu-like symptoms 05/25/2016  . Head injury 11/05/2015  . Insomnia 11/05/2015  . Former cigarette smoker 11/05/2015  . Chronic low back pain without sciatica 02/18/2015  . Fall 01/14/2015  . Lower abdominal pain 10/08/2014  . Generalized anxiety disorder 06/04/2014  . Anxiety state 01/07/2014  . Panic attacks 12/22/2013  . Lumbosacral spondylosis without myelopathy 09/29/2013  . Osteoarthritis of  [place] joint of thumb 04/18/2013  . Lumbar paraspinal muscle spasm 01/16/2013  . Reflux 12/13/2011  . Lower leg edema 12/10/2011  . Herpes simplex 10/07/2011  . Peripheral neuropathy 10/07/2011  . Hypercholesteremia 10/07/2011  . History of tuberculosis exposure 10/01/2011  . Recurrent UTI 08/06/2011  . HIV positive ([place]) 08/03/2011  . Sleep disorder 08/03/2011  . Back pain 07/21/2011  . Osteoporosis 03/09/2008     Health Maintenance Due  Topic Date Due  . Zoster Vaccines- Shingrix (1 of 2)  Never done  . TETANUS/TDAP  05/22/2017  . COVID-19 Vaccine (3 - Pfizer risk 4-dose series) 07/18/2019     Review of Systems 12 point ros is negative except arthritis of large joints/back Physical Exam   BP 112/68   Pulse (!) 59   Temp 97.9 F (36.6 C) (Oral)   Ht 5\' 7"  (1.702 m)   Wt 174 lb (78.9 kg)   SpO2 97%   BMI 27.25 kg/m   Physical Exam  Constitutional:  oriented to person, place, and time. appears well-developed and well-nourished. No distress.  HENT: [place]/AT, PERRLA, no scleral icterus Mouth/Throat: Oropharynx is clear and moist. No oropharyngeal exudate.  Cardiovascular: Normal rate, regular rhythm and normal heart sounds. Exam reveals no gallop and no friction rub.  No murmur heard.  Pulmonary/Chest: Effort normal and breath sounds normal. No respiratory distress.  has no wheezes.  Neck = supple, no nuchal rigidity Abdominal: Soft. Bowel sounds are normal.  exhibits no distension. There is no tenderness.  Lymphadenopathy: no cervical adenopathy. No axillary adenopathy Neurological: alert and oriented to person, place, and time.  Skin: Skin is warm and dry. No rash noted. No erythema. Long standing macule, scally right inner leg Psychiatric: a normal mood and affect.  behavior is normal.   Lab Results  Component Value Date   CD4TCELL 16 (L) 11/20/2019   Lab Results  Component Value Date   CD4TABS 369 (L) 11/20/2019   CD4TABS 494 03/15/2019   CD4TABS 370 (L) 04/04/2018   Lab Results  Component Value Date   HIV1RNAQUANT <20 11/20/2019   Lab Results  Component Value Date   HEPBSAB REACTIVE (A) 05/18/2017   Lab Results  Component Value Date   LABRPR NON-REACTIVE 04/04/2018    CBC Lab Results  Component Value Date   WBC 6.7 01/30/2020   RBC 4.74 01/30/2020   HGB 13.3 01/30/2020   HCT 42.7 01/30/2020   PLT 287 01/30/2020   MCV 90.1 01/30/2020   MCH 28.1 01/30/2020   MCHC 31.1 01/30/2020   RDW 13.3 01/30/2020   LYMPHSABS 2,576 11/20/2019   MONOABS  1,304 (H) 06/01/2016   EOSABS 576 (H) 11/20/2019    BMET Lab Results  Component Value Date   NA 142 01/30/2020   K 3.8 01/30/2020   CL 109 01/30/2020   CO2 26 01/30/2020   GLUCOSE 99 01/30/2020   BUN 16 01/30/2020   CREATININE 0.83 01/30/2020   CALCIUM 9.9 01/30/2020   GFRNONAA >60 01/30/2020   GFRAA 76 11/20/2019     Assessment and Plan  hiv disease = check labs,   Hx of hld = now doing diet modification. Has upcoming pcp visit -- will do cholesterol panel  Vitamin d deficiency = continue on supplementation. She reports pcp to check levels soon.  Long term medication management = will check cr  Hyperpigmented patch = right inner thigh, chronic. Not itching. ? Lichens planus. Will try to do a trial of hydrocortisone 1% cream bid. Will do derm referral

## 2020-08-09 LAB — T-HELPER CELL (CD4) - (RCID CLINIC ONLY)
CD4 % Helper T Cell: 21 % — ABNORMAL LOW (ref 33–65)
CD4 T Cell Abs: 518 /uL (ref 400–1790)

## 2020-08-11 LAB — COMPLETE METABOLIC PANEL WITH GFR
AG Ratio: 1.5 (calc) (ref 1.0–2.5)
ALT: 11 U/L (ref 6–29)
AST: 17 U/L (ref 10–35)
Albumin: 4.4 g/dL (ref 3.6–5.1)
Alkaline phosphatase (APISO): 68 U/L (ref 37–153)
BUN/Creatinine Ratio: 11 (calc) (ref 6–22)
BUN: 11 mg/dL (ref 7–25)
CO2: 29 mmol/L (ref 20–32)
Calcium: 9.8 mg/dL (ref 8.6–10.4)
Chloride: 104 mmol/L (ref 98–110)
Creat: 1 mg/dL — ABNORMAL HIGH (ref 0.50–0.99)
GFR, Est African American: 70 mL/min/{1.73_m2} (ref >=60)
GFR, Est Non African American: 60 mL/min/{1.73_m2} (ref >=60)
Globulin: 2.9 g/dL (calc) (ref 1.9–3.7)
Glucose, Bld: 100 mg/dL — ABNORMAL HIGH (ref 65–99)
Potassium: 4.2 mmol/L (ref 3.5–5.3)
Sodium: 140 mmol/L (ref 135–146)
Total Bilirubin: 0.5 mg/dL (ref 0.2–1.2)
Total Protein: 7.3 g/dL (ref 6.1–8.1)

## 2020-08-11 LAB — CBC WITH DIFFERENTIAL/PLATELET
Absolute Monocytes: 511 cells/uL (ref 200–950)
Basophils Absolute: 49 cells/uL (ref 0–200)
Basophils Relative: 0.7 %
Eosinophils Absolute: 371 cells/uL (ref 15–500)
Eosinophils Relative: 5.3 %
HCT: 42.7 % (ref 35.0–45.0)
Hemoglobin: 13.6 g/dL (ref 11.7–15.5)
Lymphs Abs: 2807 cells/uL (ref 850–3900)
MCH: 27.9 pg (ref 27.0–33.0)
MCHC: 31.9 g/dL — ABNORMAL LOW (ref 32.0–36.0)
MCV: 87.7 fL (ref 80.0–100.0)
MPV: 10 fL (ref 7.5–12.5)
Monocytes Relative: 7.3 %
Neutro Abs: 3262 cells/uL (ref 1500–7800)
Neutrophils Relative %: 46.6 %
Platelets: 280 10*3/uL (ref 140–400)
RBC: 4.87 10*6/uL (ref 3.80–5.10)
RDW: 12.3 % (ref 11.0–15.0)
Total Lymphocyte: 40.1 %
WBC: 7 10*3/uL (ref 3.8–10.8)

## 2020-08-11 LAB — HIV-1 RNA QUANT-NO REFLEX-BLD
HIV 1 RNA Quant: NOT DETECTED Copies/mL
HIV-1 RNA Quant, Log: NOT DETECTED Log cps/mL

## 2020-08-11 LAB — RPR: RPR Ser Ql: NONREACTIVE

## 2020-08-19 ENCOUNTER — Telehealth: Payer: Self-pay | Admitting: Physical Therapy

## 2020-08-19 ENCOUNTER — Ambulatory Visit: Payer: Medicare HMO | Attending: Gynecologic Oncology | Admitting: Physical Therapy

## 2020-08-19 DIAGNOSIS — M6281 Muscle weakness (generalized): Secondary | ICD-10-CM | POA: Insufficient documentation

## 2020-08-19 DIAGNOSIS — R3989 Other symptoms and signs involving the genitourinary system: Secondary | ICD-10-CM | POA: Insufficient documentation

## 2020-08-19 DIAGNOSIS — R278 Other lack of coordination: Secondary | ICD-10-CM | POA: Insufficient documentation

## 2020-08-19 DIAGNOSIS — R252 Cramp and spasm: Secondary | ICD-10-CM | POA: Insufficient documentation

## 2020-08-19 DIAGNOSIS — M545 Low back pain, unspecified: Secondary | ICD-10-CM | POA: Insufficient documentation

## 2020-08-19 DIAGNOSIS — G8929 Other chronic pain: Secondary | ICD-10-CM | POA: Insufficient documentation

## 2020-08-19 NOTE — Telephone Encounter (Signed)
Called patient about her missed appointment today at 1230. She had an earlier appointment that ran into the PT appointment time. She is aware of the attendance policy and knows when her next visit is.  Earlie Counts, PT @6 /13/2022@ 1:15 PM

## 2020-08-20 ENCOUNTER — Other Ambulatory Visit (HOSPITAL_COMMUNITY): Payer: Self-pay

## 2020-08-22 ENCOUNTER — Other Ambulatory Visit (HOSPITAL_COMMUNITY): Payer: Self-pay

## 2020-08-22 MED FILL — Emtricitabine-Tenofovir Alafenamide Fumarate Tab 200-25 MG: ORAL | 30 days supply | Qty: 30 | Fill #2 | Status: AC

## 2020-08-22 MED FILL — Dolutegravir Sodium Tab 50 MG (Base Equiv): ORAL | 30 days supply | Qty: 30 | Fill #2 | Status: AC

## 2020-08-28 ENCOUNTER — Other Ambulatory Visit: Payer: Self-pay

## 2020-08-28 ENCOUNTER — Ambulatory Visit: Payer: Medicare HMO | Admitting: Physical Therapy

## 2020-08-28 ENCOUNTER — Encounter: Payer: Self-pay | Admitting: Physical Therapy

## 2020-08-28 DIAGNOSIS — G8929 Other chronic pain: Secondary | ICD-10-CM

## 2020-08-28 DIAGNOSIS — R3989 Other symptoms and signs involving the genitourinary system: Secondary | ICD-10-CM

## 2020-08-28 DIAGNOSIS — M545 Low back pain, unspecified: Secondary | ICD-10-CM

## 2020-08-28 DIAGNOSIS — M6281 Muscle weakness (generalized): Secondary | ICD-10-CM

## 2020-08-28 DIAGNOSIS — R252 Cramp and spasm: Secondary | ICD-10-CM | POA: Diagnosis present

## 2020-08-28 DIAGNOSIS — R278 Other lack of coordination: Secondary | ICD-10-CM

## 2020-08-28 NOTE — Therapy (Signed)
[facility] [address] [place], [place], [address] Phone: [phone]   Fax:  [phone]  Physical Therapy Treatment  Patient Details  Name: Courtney Hamilton MRN: [id number] Date of Birth: Jul 14, 1960 Referring Provider (PT): Dr. Jeral Pinch   Encounter Date: 08/28/2020   PT End of Session - 08/28/20 1237     Visit Number 7    Date for PT Re-Evaluation 09/25/20    Authorization Type medicare/medicaid    Authorization - Visit Number 7    Authorization - Number of Visits 10    PT Start Time 3875    PT Stop Time 6433    PT Time Calculation (min) 38 min    Activity Tolerance Patient tolerated treatment well;No increased pain    Behavior During Therapy [facility] for tasks assessed/performed             Past Medical History:  Diagnosis Date   Arthritis    right hip   Bronchitis last time  sept/oct 2021   Hepatitis 1980's   NON A NON B tx for 1980's resolved   Herniated disc 10/07/2011   lower back   History of chronic kidney disease 1980's   resolved   HIV (human immunodeficiency virus infection) ([place]) 1985   Hypertension    Lower GI bleed 05/10/2012   pt denies   Neuropathy    resolved   Osteoporosis    Ruptured lumbar disc    Sciatica    right side   Substance abuse ([place])    past hsitory  clean more than 20 years    TB (pulmonary tuberculosis) 1993    exposure, treated    VIN III (vulvar intraepithelial neoplasia III)     Past Surgical History:  Procedure Laterality Date   APPENDECTOMY  1980's   BREAST EXCISIONAL BIOPSY Left 2001 per pt   cyst removed    CHOLECYSTECTOMY  2951'O   CO2 LASER APPLICATION N/A 84/16/6063   Procedure: CO2 LASER APPLICATION OF VULVA (PERI-CLITORAL AND PERI-ANAL);  Surgeon: Lafonda Mosses, MD;  Location: [facility];  Service: Gynecology;  Laterality: N/A;   ECTOPIC PREGNANCY SURGERY  1985   injections to lower back  09/2017   LAPAROSCOPY N/A  07/03/2019   Procedure: LAPAROSCOPY DIAGNOSTIC; LYSIS OF ADHESIONS;  Surgeon: Lafonda Mosses, MD;  Location: [facility];  Service: Gynecology;  Laterality: N/A;   ROBOTIC ASSISTED TOTAL HYSTERECTOMY WITH BILATERAL SALPINGO OOPHERECTOMY N/A 07/03/2019   Procedure: XI ROBOTIC ASSISTED TOTAL HYSTERECTOMY WITH UNILATERAL SALPINGO OOPHORECTOMY;  Surgeon: Lafonda Mosses, MD;  Location: [facility];  Service: Gynecology;  Laterality: N/A;   SALIVARY GLAND SURGERY  1990   VULVECTOMY N/A 01/18/2018   Procedure: PARTIAL VULVECTOMY;  Surgeon: [name], MD;  Location: [facility];  Service: Gynecology;  Laterality: N/A;    There were no vitals filed for this visit.   Subjective Assessment - 08/28/20 1237     Subjective I have loss some weight. Patient has to take time for herself.    Patient Stated Goals reduce pain    Currently in Pain? Yes    Pain Score 8     Pain Location Hip    Pain Orientation Right    Pain Descriptors / Indicators Sharp    Pain Type Acute pain    Pain Onset More than a month ago    Pain Frequency Intermittent    Aggravating Factors  standing    Pain Relieving Factors sitting, ice  Multiple Pain Sites Yes    Pain Score 7    Pain Location Bladder    Pain Orientation Mid    Pain Descriptors / Indicators Sharp    Pain Type Chronic pain    Pain Onset More than a month ago    Pain Frequency Intermittent    Aggravating Factors  tightening the stomach to urinate    Pain Relieving Factors exercises                               [facility] Adult PT Treatment/Exercise - 08/28/20 0001       Lumbar Exercises: Stretches   Active Hamstring Stretch Right;Left;1 rep;30 seconds    Active Hamstring Stretch Limitations supine with strap    ITB Stretch Right;Left;1 rep;30 seconds    ITB Stretch Limitations supine with a strap    Piriformis Stretch Right;Left;1 rep;30 seconds    Piriformis Stretch Limitations then do the trunk rotation  holding 30 seconds each side      Manual Therapy   Manual Therapy Soft tissue mobilization    Soft tissue mobilization scar massage to the suprapubic area and the right lateral abdominal area; bilateral diaphragm manual work;    Myofascial Release fascial release around the bladder, along the lower abdomen, and release suprapubically monitoring for pain; tissue rolling suprapubically                      PT Short Term Goals - 06/27/20 1117       PT SHORT TERM GOAL #1   Title independent with initial HEP with stretches and diaphragmatic breating    Time 4    Period Weeks    Status Achieved      PT SHORT TERM GOAL #2   Title bladder pain with urination decreased >/= 25% due to restrictions of the bladder    Time 4    Period Weeks    Status Achieved               PT Long Term Goals - 08/28/20 1244       PT LONG TERM GOAL #1   Title independent with advanced HEP for strengthening    Baseline still learning    Time 12    Period Weeks    Status On-going      PT LONG TERM GOAL #2   Title bladder pain with urination decreased >/= 50% due to reduction of muscle spasms and the ability to relax the pelvic floor    Baseline since not being in therapy her pain has returned    Time 12    Period Weeks    Status On-going      PT LONG TERM GOAL #3   Title able to walk to the commode slowly after she has a strong urge to urinate due to using behavioral techniques    Baseline 40% better    Time 12    Period Weeks    Status On-going      PT LONG TERM GOAL #4   Title able to diaphragmatically breath to bulge the pelvic floor and relax the bladder    Time 12    Period Weeks    Status On-going                   Plan - 08/28/20 1314     Clinical Impression Statement Patient has not been in therapy for 2  months. She has been keeping up with her HEP but her pain has increased again. She has increased thickness in the suprapubic area that takes awhile to  soften. Patient is able to walk to the commode with 40% less leakage. She will have lower abdominal pain when she resists hip flexion. Patient needs to work on pelvic floor strength in standing. Patient will benefit from skilled therapy to improve pelvic floor coordination to reduce her leakage.    Personal Factors and Comorbidities Age;Fitness;Comorbidity 3+;Sex    Comorbidities HIV; Hysterectomy 07/03/2019; osteoporosis; Vulvectomy 01/18/2018    Examination-Activity Limitations Sit;Continence;Toileting;Stand    Examination-Participation Restrictions Interpersonal Relationship;Community Activity;Driving;Shop    Stability/Clinical Decision Making Evolving/Moderate complexity    Rehab Potential Good    PT Frequency 1x / week    PT Duration 12 weeks    PT Treatment/Interventions ADLs/Self Care Home Management;Biofeedback;Cryotherapy;Electrical Stimulation;Moist Heat;Ultrasound;Neuromuscular re-education;Therapeutic exercise;Therapeutic activities;Patient/family education;Manual techniques;Dry needling;Passive range of motion    PT Next Visit Plan go over lifting to reduce strain on pelvic floor; pelvic floor strength in standing, abdominal work    PT Home Exercise Plan Access Code: [email or web address]    Consulted and Agree with Plan of Care Patient             Patient will benefit from skilled therapeutic intervention in order to improve the following deficits and impairments:  Decreased coordination, Decreased range of motion, Increased fascial restricitons, Decreased activity tolerance, Increased muscle spasms, Pain, Decreased strength, Decreased mobility  Visit Diagnosis: Muscle weakness (generalized)  Other lack of coordination  Chronic midline low back pain without sciatica  Bladder pain  Cramp and spasm     Problem List Patient Active Problem List   Diagnosis Date Noted   Carcinoma in situ of cervix 07/11/2019   Thickened endometrium    Cervical stenosis (uterine cervix)    Vulvar  intraepithelial neoplasia (VIN) grade 3    Essential hypertension 08/05/2017   Fungal infection of skin of abdomen 06/16/2017   Flu-like symptoms 05/25/2016   Head injury 11/05/2015   Insomnia 11/05/2015   Former cigarette smoker 11/05/2015   Chronic low back pain without sciatica 02/18/2015   Fall 01/14/2015   Lower abdominal pain 10/08/2014   Generalized anxiety disorder 06/04/2014   Anxiety state 01/07/2014   Panic attacks 12/22/2013   Lumbosacral spondylosis without myelopathy 09/29/2013   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   Lumbar paraspinal muscle spasm 01/16/2013   Reflux 12/13/2011   Lower leg edema 12/10/2011   Herpes simplex 10/07/2011   Peripheral neuropathy 10/07/2011   Hypercholesteremia 10/07/2011   History of tuberculosis exposure 10/01/2011   Recurrent UTI 08/06/2011   HIV positive ([place]) 08/03/2011   Sleep disorder 08/03/2011   Back pain 07/21/2011   Osteoporosis 03/09/2008    Earlie Counts, PT 08/28/20 1:21 PM  [facility] [address], [place], [place], [address] Phone: [phone]   Fax:  [phone]  Name: Courtney Hamilton MRN: [id number] Date of Birth: 1960/09/25

## 2020-08-30 ENCOUNTER — Other Ambulatory Visit (HOSPITAL_COMMUNITY): Payer: Self-pay

## 2020-09-04 ENCOUNTER — Ambulatory Visit: Payer: Medicare HMO | Admitting: Physical Therapy

## 2020-09-11 ENCOUNTER — Other Ambulatory Visit: Payer: Self-pay

## 2020-09-11 ENCOUNTER — Ambulatory Visit: Payer: Medicare HMO | Attending: Gynecologic Oncology | Admitting: Physical Therapy

## 2020-09-11 ENCOUNTER — Encounter: Payer: Self-pay | Admitting: Physical Therapy

## 2020-09-11 DIAGNOSIS — R252 Cramp and spasm: Secondary | ICD-10-CM | POA: Insufficient documentation

## 2020-09-11 DIAGNOSIS — G8929 Other chronic pain: Secondary | ICD-10-CM

## 2020-09-11 DIAGNOSIS — M545 Low back pain, unspecified: Secondary | ICD-10-CM | POA: Diagnosis present

## 2020-09-11 DIAGNOSIS — R3989 Other symptoms and signs involving the genitourinary system: Secondary | ICD-10-CM | POA: Diagnosis present

## 2020-09-11 DIAGNOSIS — R278 Other lack of coordination: Secondary | ICD-10-CM | POA: Diagnosis present

## 2020-09-11 DIAGNOSIS — M6281 Muscle weakness (generalized): Secondary | ICD-10-CM | POA: Diagnosis present

## 2020-09-11 NOTE — Therapy (Addendum)
[facility] [address], [place], [place], [address] Phone: [phone]   Fax:  [phone]  Physical Therapy Treatment  Patient Details  Name: Courtney Hamilton MRN: [id number] Date of Birth: 03/05/1961 Referring Provider (PT): Dr. Jeral Pinch   Encounter Date: 09/11/2020   PT End of Session - 09/11/20 1235     Visit Number 8    Date for PT Re-Evaluation 11/20/20    Authorization Type medicare/medicaid    Authorization - Visit Number 8    Authorization - Number of Visits 10    PT Start Time 9166    PT Stop Time 1310    PT Time Calculation (min) 40 min    Activity Tolerance Patient tolerated treatment well;No increased pain    Behavior During Therapy [facility] for tasks assessed/performed             Past Medical History:  Diagnosis Date   Arthritis    right hip   Bronchitis last time  sept/oct 2021   Hepatitis 1980's   NON A NON B tx for 1980's resolved   Herniated disc 10/07/2011   lower back   History of chronic kidney disease 1980's   resolved   HIV (human immunodeficiency virus infection) ([place]) 1985   Hypertension    Lower GI bleed 05/10/2012   pt denies   Neuropathy    resolved   Osteoporosis    Ruptured lumbar disc    Sciatica    right side   Substance abuse ([place])    past hsitory  clean more than 20 years    TB (pulmonary tuberculosis) 1993    exposure, treated    VIN III (vulvar intraepithelial neoplasia III)     Past Surgical History:  Procedure Laterality Date   APPENDECTOMY  1980's   BREAST EXCISIONAL BIOPSY Left 2001 per pt   cyst removed    CHOLECYSTECTOMY  0600'K   CO2 LASER APPLICATION N/A 59/97/7414   Procedure: CO2 LASER APPLICATION OF VULVA (PERI-CLITORAL AND PERI-ANAL);  Surgeon: Lafonda Mosses, MD;  Location: [facility];  Service: Gynecology;  Laterality: N/A;   ECTOPIC PREGNANCY SURGERY  1985   injections to lower back  09/2017   LAPAROSCOPY N/A  07/03/2019   Procedure: LAPAROSCOPY DIAGNOSTIC; LYSIS OF ADHESIONS;  Surgeon: Lafonda Mosses, MD;  Location: [facility];  Service: Gynecology;  Laterality: N/A;   ROBOTIC ASSISTED TOTAL HYSTERECTOMY WITH BILATERAL SALPINGO OOPHERECTOMY N/A 07/03/2019   Procedure: XI ROBOTIC ASSISTED TOTAL HYSTERECTOMY WITH UNILATERAL SALPINGO OOPHORECTOMY;  Surgeon: Lafonda Mosses, MD;  Location: [facility];  Service: Gynecology;  Laterality: N/A;   SALIVARY GLAND SURGERY  1990   VULVECTOMY N/A 01/18/2018   Procedure: PARTIAL VULVECTOMY;  Surgeon: [name], MD;  Location: [facility];  Service: Gynecology;  Laterality: N/A;    There were no vitals filed for this visit.   Subjective Assessment - 09/11/20 1239     Subjective The bladder pain has returned.    Patient Stated Goals reduce pain    Currently in Pain? Yes    Pain Score 8     Pain Location Bladder    Pain Orientation Mid    Pain Descriptors / Indicators Pressure    Pain Type Chronic pain    Pain Onset More than a month ago    Pain Frequency Intermittent    Aggravating Factors  going to the bathroom  [facility] PT Assessment - 09/11/20 0001       Assessment   Medical Diagnosis R39.89 Bladder pain    Referring Provider (PT) Dr. Jeral Pinch    Onset Date/Surgical Date --   07/03/2019   Prior Therapy none      Precautions   Precautions Other (comment)    Precaution Comments HIV; osteoporosis      Restrictions   Weight Bearing Restrictions No      Posture/Postural Control   Posture/Postural Control Postural limitations    Postural Limitations Decreased lumbar lordosis;Posterior pelvic tilt      PROM   Right Hip Flexion 118    Right Hip External Rotation  55    Left Hip Flexion 120    Left Hip External Rotation  55      Strength   Right Hip Extension 4-/5    Right Hip ABduction 4/5    Left Hip Extension 4/5    Left Hip ABduction 5/5                         Pelvic Floor Special Questions - 09/11/20 0001     Currently Sexually Active Yes    Is this Painful Yes   deep and initial penetration   Urinary Leakage Yes    Pad use no    Activities that cause leaking With strong urge    Urinary urgency Yes    Fecal incontinence No    Pelvic Floor Internal Exam Patient confirms identification and approves PT to assess pelvic floor and treatment    Exam Type Vaginal    Palpation tenderness located in thelevator ani and sides of the bladder    Strength good squeeze, good lift, able to hold agaisnt strong resistance               [facility] Adult PT Treatment/Exercise - 09/11/20 0001       Lumbar Exercises: Stretches   Press Ups 4 reps    Press Ups Limitations only goes half way    Quadruped Mid Back Stretch 1 rep;30 seconds    Quadruped Mid Back Stretch Limitations childs pose    Piriformis Stretch Right;Left;1 rep;30 seconds    Piriformis Stretch Limitations sititng      Manual Therapy   Manual Therapy Internal Pelvic Floor    Internal Pelvic Floor manual work to bilateral urethra sphincter, levator ani, fascial release around the bladder and urethra with one finger in the vaginal canal and using the other hand outside abouve the puibic bone                      PT Short Term Goals - 09/11/20 1237       PT SHORT TERM GOAL #1   Title independent with initial HEP with stretches and diaphragmatic breating    Time 4    Period Weeks    Status Achieved      PT SHORT TERM GOAL #2   Title bladder pain with urination decreased >/= 25% due to restrictions of the bladder    Time 4    Period Weeks    Status Achieved               PT Long Term Goals - 09/11/20 1237       PT LONG TERM GOAL #1   Title independent with advanced HEP for strengthening    Baseline still learning    Time 12  Period Weeks    Status On-going      PT LONG TERM GOAL #2   Title bladder pain with urination decreased >/= 50% due to reduction of  muscle spasms and the ability to relax the pelvic floor    Time 12    Period Weeks    Status On-going                   Plan - 09/11/20 1236     Clinical Impression Statement Patient reports her urgency and leakage has reduced by 80%. Patient has increased bilateral hip strength. Pelvic floor strength has increased to 4/5. She continues to have bladder pain. It was better initiallly then she went out of town and the pain has returned. Patient bladder pain is 8/10 when she urinates. She has tenderness and fascial restrictions around the bladder. Patient has not had penile penetration vaginally for awhile so she does not know if there is pain. Patient has tenderness and thickness suprapubically. Patient will benefit from skilled therapy to improve pelvic floor coordination to reduce her leakage.    Personal Factors and Comorbidities Age;Fitness;Comorbidity 3+;Sex    Comorbidities HIV; Hysterectomy 07/03/2019; osteoporosis; Vulvectomy 01/18/2018    Examination-Activity Limitations Sit;Continence;Toileting;Stand    Examination-Participation Restrictions Interpersonal Relationship;Community Activity;Driving;Shop    Stability/Clinical Decision Making Evolving/Moderate complexity    Rehab Potential Good    PT Frequency 1x / week    PT Duration 12 weeks    PT Treatment/Interventions ADLs/Self Care Home Management;Biofeedback;Cryotherapy;Electrical Stimulation;Moist Heat;Ultrasound;Neuromuscular re-education;Therapeutic exercise;Therapeutic activities;Patient/family education;Manual techniques;Dry needling;Passive range of motion    PT Next Visit Plan internal work, go over lifting, toileting to reduce pain    PT Home Exercise Plan Access Code: [email or web address]    Recommended Other Services renewal sent to MD    Consulted and Agree with Plan of Care Patient             Patient will benefit from skilled therapeutic intervention in order to improve the following deficits and impairments:   Decreased coordination, Decreased range of motion, Increased fascial restricitons, Decreased activity tolerance, Increased muscle spasms, Pain, Decreased strength, Decreased mobility  Visit Diagnosis: Muscle weakness (generalized) - Plan: PT plan of care cert/re-cert  Other lack of coordination - Plan: PT plan of care cert/re-cert  Chronic midline low back pain without sciatica - Plan: PT plan of care cert/re-cert  Bladder pain - Plan: PT plan of care cert/re-cert  Cramp and spasm - Plan: PT plan of care cert/re-cert     Problem List Patient Active Problem List   Diagnosis Date Noted   Carcinoma in situ of cervix 07/11/2019   Thickened endometrium    Cervical stenosis (uterine cervix)    Vulvar intraepithelial neoplasia (VIN) grade 3    Essential hypertension 08/05/2017   Fungal infection of skin of abdomen 06/16/2017   Flu-like symptoms 05/25/2016   Head injury 11/05/2015   Insomnia 11/05/2015   Former cigarette smoker 11/05/2015   Chronic low back pain without sciatica 02/18/2015   Fall 01/14/2015   Lower abdominal pain 10/08/2014   Generalized anxiety disorder 06/04/2014   Anxiety state 01/07/2014   Panic attacks 12/22/2013   Lumbosacral spondylosis without myelopathy 09/29/2013   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   Lumbar paraspinal muscle spasm 01/16/2013   Reflux 12/13/2011   Lower leg edema 12/10/2011   Herpes simplex 10/07/2011   Peripheral neuropathy 10/07/2011   Hypercholesteremia 10/07/2011   History of tuberculosis exposure 10/01/2011   Recurrent UTI 08/06/2011  HIV positive ([place]) 08/03/2011   Sleep disorder 08/03/2011   Back pain 07/21/2011   Osteoporosis 03/09/2008    Earlie Counts, PT 09/11/20 1:23 PM  [facility] [address] [place], [place], [address] Phone: [phone]   Fax:  [phone]  Name: Courtney Hamilton MRN: [id number] Date of Birth: [birth year]  PHYSICAL  THERAPY DISCHARGE SUMMARY  Visits from Start of Care: 8  Current functional level related to goals / functional outcomes: See above. Patient has cancelled her last few appointments.    Remaining deficits: See above. Unable to assess patient fully for discharge due to not coming to her last few appointments.    Education / Equipment: HEP   Patient agrees to discharge. Patient goals were not met. Patient is being discharged due to not returning since the last visit. Thank you for the referral. Earlie Counts, PT 11/19/20 8:22 AM

## 2020-09-13 ENCOUNTER — Telehealth: Payer: Self-pay

## 2020-09-13 NOTE — Telephone Encounter (Signed)
Patient calling for Gabapentin Rx. I advised we do not write this for her and she stated Dr Baxter Flattery told her to take this med and she does not see the doctor who wrote it in 2020. Please send in Rx or advise triage on what to do next.

## 2020-09-19 ENCOUNTER — Ambulatory Visit: Payer: Medicare HMO | Admitting: Physical Therapy

## 2020-09-23 ENCOUNTER — Encounter: Payer: Medicare HMO | Admitting: Physical Therapy

## 2020-09-24 ENCOUNTER — Other Ambulatory Visit: Payer: Self-pay | Admitting: Internal Medicine

## 2020-09-24 ENCOUNTER — Other Ambulatory Visit (HOSPITAL_COMMUNITY): Payer: Self-pay

## 2020-09-24 DIAGNOSIS — Z21 Asymptomatic human immunodeficiency virus [HIV] infection status: Secondary | ICD-10-CM

## 2020-09-24 MED ORDER — DESCOVY 200-25 MG PO TABS
1.0000 | ORAL_TABLET | Freq: Every day | ORAL | 5 refills | Status: DC
  Filled 2020-09-24 – 2020-09-30 (×2): qty 30, 30d supply, fill #0
  Filled 2020-10-31: qty 30, 30d supply, fill #1
  Filled 2020-11-27: qty 30, 30d supply, fill #2
  Filled 2020-12-25: qty 30, 30d supply, fill #3
  Filled 2021-01-22: qty 30, 30d supply, fill #4
  Filled 2021-03-05: qty 30, 30d supply, fill #5

## 2020-09-24 MED ORDER — TIVICAY 50 MG PO TABS
ORAL_TABLET | Freq: Every day | ORAL | 5 refills | Status: DC
  Filled 2020-09-24: qty 30, fill #0
  Filled 2020-09-30: qty 30, 30d supply, fill #0
  Filled 2020-10-31: qty 30, 30d supply, fill #1
  Filled 2020-11-27: qty 30, 30d supply, fill #2
  Filled 2020-12-25: qty 30, 30d supply, fill #3
  Filled 2021-01-22: qty 30, 30d supply, fill #4
  Filled 2021-03-05: qty 30, 30d supply, fill #5

## 2020-09-30 ENCOUNTER — Other Ambulatory Visit (HOSPITAL_COMMUNITY): Payer: Self-pay

## 2020-10-07 ENCOUNTER — Encounter (HOSPITAL_COMMUNITY): Payer: Self-pay | Admitting: Emergency Medicine

## 2020-10-07 ENCOUNTER — Other Ambulatory Visit: Payer: Self-pay

## 2020-10-07 ENCOUNTER — Ambulatory Visit (HOSPITAL_COMMUNITY)
Admission: EM | Admit: 2020-10-07 | Discharge: 2020-10-07 | Disposition: A | Discharge status: Home / Self Care | Payer: Medicare HMO

## 2020-10-07 DIAGNOSIS — S060X0A Concussion without loss of consciousness, initial encounter: Secondary | ICD-10-CM | POA: Diagnosis not present

## 2020-10-07 NOTE — ED Provider Notes (Signed)
[facility]    CSN: [id number] Arrival date & time: 10/07/20  1155      History   Chief Complaint Chief Complaint  Patient presents with   Headache    HPI Courtney Hamilton is a 60 y.o. female.   Patient here for evaluation of headache after hitting head 3 days ago.  Reports hitting right side of head on mantle 3 days ago but denies any LOC.  Reports having some nausea and headache since hitting head.  Has not taken any OTC medication.  Denies any specific alleviating or aggravating factors.  Denies any fevers, chest pain, shortness of breath, N/V/D, numbness, tingling, weakness, abdominal pain.    The history is provided by the patient.  Headache Associated symptoms: nausea   Associated symptoms: no dizziness, no numbness, no vomiting and no weakness    Past Medical History:  Diagnosis Date   Arthritis    right hip   Bronchitis last time  sept/oct 2021   Hepatitis 1980's   NON A NON B tx for 1980's resolved   Herniated disc 10/07/2011   lower back   History of chronic kidney disease 1980's   resolved   HIV (human immunodeficiency virus infection) ([place]) 1985   Hypertension    Lower GI bleed 05/10/2012   pt denies   Neuropathy    resolved   Osteoporosis    Ruptured lumbar disc    Sciatica    right side   Substance abuse ([place])    past hsitory  clean more than 20 years    TB (pulmonary tuberculosis) 1993    exposure, treated    VIN III (vulvar intraepithelial neoplasia III)     Patient Active Problem List   Diagnosis Date Noted   Carcinoma in situ of cervix 07/11/2019   Thickened endometrium    Cervical stenosis (uterine cervix)    Vulvar intraepithelial neoplasia (VIN) grade 3    Essential hypertension 08/05/2017   Fungal infection of skin of abdomen 06/16/2017   Flu-like symptoms 05/25/2016   Head injury 11/05/2015   Insomnia 11/05/2015   Former cigarette smoker 11/05/2015   Chronic low back pain without sciatica 02/18/2015   Fall 01/14/2015    Lower abdominal pain 10/08/2014   Generalized anxiety disorder 06/04/2014   Anxiety state 01/07/2014   Panic attacks 12/22/2013   Lumbosacral spondylosis without myelopathy 09/29/2013   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   Lumbar paraspinal muscle spasm 01/16/2013   Reflux 12/13/2011   Lower leg edema 12/10/2011   Herpes simplex 10/07/2011   Peripheral neuropathy 10/07/2011   Hypercholesteremia 10/07/2011   History of tuberculosis exposure 10/01/2011   Recurrent UTI 08/06/2011   HIV positive ([place]) 08/03/2011   Sleep disorder 08/03/2011   Back pain 07/21/2011   Osteoporosis 03/09/2008    Past Surgical History:  Procedure Laterality Date   APPENDECTOMY  1980's   BREAST EXCISIONAL BIOPSY Left 2001 per pt   cyst removed    CHOLECYSTECTOMY  123456   CO2 LASER APPLICATION N/A [id number]   Procedure: CO2 LASER APPLICATION OF VULVA (PERI-CLITORAL AND PERI-ANAL);  Surgeon: Lafonda Mosses, MD;  Location: [facility];  Service: Gynecology;  Laterality: N/A;   ECTOPIC PREGNANCY SURGERY  1985   injections to lower back  09/2017   LAPAROSCOPY N/A 07/03/2019   Procedure: LAPAROSCOPY DIAGNOSTIC; LYSIS OF ADHESIONS;  Surgeon: Lafonda Mosses, MD;  Location: [facility];  Service: Gynecology;  Laterality: N/A;   ROBOTIC ASSISTED TOTAL HYSTERECTOMY WITH BILATERAL  SALPINGO OOPHERECTOMY N/A 07/03/2019   Procedure: XI ROBOTIC ASSISTED TOTAL HYSTERECTOMY WITH UNILATERAL SALPINGO OOPHORECTOMY;  Surgeon: Lafonda Mosses, MD;  Location: [facility];  Service: Gynecology;  Laterality: N/A;   SALIVARY GLAND SURGERY  1990   VULVECTOMY N/A 01/18/2018   Procedure: PARTIAL VULVECTOMY;  Surgeon: [name], MD;  Location: [facility];  Service: Gynecology;  Laterality: N/A;    OB History     Gravida  4   Para  1   Term  1   Preterm  0   AB  3   Living  1      SAB  1   IAB  1   Ectopic  1   Multiple  0   Live Births  1             Home Medications    Prior to Admission medications   Medication Sig Start Date End Date Taking? Authorizing Provider  acyclovir ointment (ZOVIRAX) 5 % Apply 1 application topically every 3 (three) hours. Patient taking differently: Apply 1 application topically every 3 (three) hours as needed (irritation.). 04/12/18   Sloan Leiter, MD  Ascorbic Acid (VITAMIN C) 1000 MG tablet Take 1,000 mg by mouth at bedtime.    [provider]  Biotin 1000 MCG tablet Take 2,000 mcg by mouth daily in the afternoon.    [provider]  conjugated estrogens (PREMARIN) vaginal cream Place 1 Applicatorful vaginally 3 (three) times a week. Patient not taking: Reported on 08/08/2020 03/11/20   Lafonda Mosses, MD  dolutegravir (TIVICAY) 50 MG tablet TAKE 1 TABLET (50 MG TOTAL) BY MOUTH DAILY. 09/24/20 09/24/21  Carlyle Basques, MD  emtricitabine-tenofovir AF (DESCOVY) 200-25 MG tablet TAKE 1 TABLET BY MOUTH DAILY. 09/24/20 09/24/21  Carlyle Basques, MD  gabapentin (NEURONTIN) 300 MG capsule TAKE 1 CAPSULE BY MOUTH THREE TIMES A DAY Patient taking differently: Take 600 mg by mouth at bedtime. 12/18/18   Kirsteins, Luanna Salk, MD  ibuprofen (ADVIL) 800 MG tablet Take 1 tablet (800 mg total) by mouth every 8 (eight) hours as needed for moderate pain. For AFTER surgery only 01/23/20   Joylene John D, NP  losartan (COZAAR) 50 MG tablet Take 50 mg by mouth daily. 11/11/19   [provider]  Multiple Minerals-Vitamins (CAL-MAG ZINC II PO) Take 1 tablet by mouth daily in the afternoon.    [provider]  Omega-3 Fatty Acids (FISH OIL PO) Take 2,000 mg by mouth daily as needed (hot flashes.).    [provider]  senna-docusate (SENOKOT-S) 8.6-50 MG tablet Take 2 tablets by mouth at bedtime. For AFTER surgery, do not take if having diarrhea 01/23/20   Cross, Lenna Sciara D, NP  traMADol (ULTRAM) 50 MG tablet Take 2 tablets (100 mg total) by mouth 3 (three) times daily as needed for  moderate pain. 02/21/18   Lovenia Kim, MD  traMADol HCl 100 MG TABS Take by mouth. 07/10/20   [provider]  zolpidem (AMBIEN) 10 MG tablet Take 1 tablet (10 mg total) by mouth at bedtime as needed for up to 20 days for sleep. 03/22/19 01/22/20  Carlyle Basques, MD    Family History Family History  Problem Relation Age of Onset   Asthma Father    COPD Father    Cancer Mother    Diabetes Brother    Hypertension Brother    Diabetes Brother    Breast cancer Sister    Colon cancer Maternal Uncle  Social History Social History   Tobacco Use   Smoking status: Former    Packs/day: 0.50    Years: 30.00    Pack years: 15.00    Types: Cigarettes   Smokeless tobacco: Never   Tobacco comments:    quit march 2018  Vaping Use   Vaping Use: Never used  Substance Use Topics   Alcohol use: No   Drug use: Not Currently    Types: Marijuana    Comment: past history of cocaine  and alcohol  none in 20 years     Allergies   Baclofen, Bactrim [sulfamethoxazole-trimethoprim], and Doxycycline   Review of Systems Review of Systems  Gastrointestinal:  Positive for nausea. Negative for vomiting.  Neurological:  Positive for headaches. Negative for dizziness, weakness, light-headedness and numbness.  All other systems reviewed and are negative.   Physical Exam Triage Vital Signs ED Triage Vitals  Enc Vitals Group     BP 10/07/20 1319 (!) 149/91     Pulse Rate 10/07/20 1319 (!) 50     Resp 10/07/20 1319 18     Temp 10/07/20 1319 98.8 F (37.1 C)     Temp Source 10/07/20 1319 Oral     SpO2 10/07/20 1319 100 %     Weight --      Height --      Head Circumference --      Peak Flow --      Pain Score 10/07/20 1317 9     Pain Loc --      Pain Edu? --      Excl. in [place]? --    No data found.  Updated Vital Signs BP (!) 149/91 (BP Location: Right Arm)   Pulse (!) 50   Temp 98.8 F (37.1 C) (Oral)   Resp 18   SpO2 100%   Visual Acuity Right Eye Distance:   Left  Eye Distance:   Bilateral Distance:    Right Eye Near:   Left Eye Near:    Bilateral Near:     Physical Exam Vitals and nursing note reviewed.  Constitutional:      General: She is not in acute distress.    Appearance: Normal appearance. She is not ill-appearing, toxic-appearing or diaphoretic.  HENT:     Head: Normocephalic and atraumatic.  Eyes:     General: No visual field deficit.    Extraocular Movements: Extraocular movements intact.     Conjunctiva/sclera: Conjunctivae normal.     Pupils: Pupils are equal, round, and reactive to light.  Cardiovascular:     Rate and Rhythm: Normal rate.     Pulses: Normal pulses.  Pulmonary:     Effort: Pulmonary effort is normal.  Abdominal:     General: Abdomen is flat.  Musculoskeletal:        General: Normal range of motion.     Cervical back: Normal range of motion.  Skin:    General: Skin is warm and dry.  Neurological:     General: No focal deficit present.     Mental Status: She is alert and oriented to person, place, and time.     GCS: GCS eye subscore is 4. GCS verbal subscore is 5. GCS motor subscore is 6.     Cranial Nerves: No cranial nerve deficit, dysarthria or facial asymmetry.     Sensory: No sensory deficit.     Motor: No weakness.     Coordination: Romberg sign negative. Coordination normal.     Gait:  Gait normal.  Psychiatric:        Mood and Affect: Mood normal.     UC Treatments / Results  Labs (all labs ordered are listed, but only abnormal results are displayed) Labs Reviewed - No data to display  EKG   Radiology No results found.  Procedures Procedures (including critical care time)  Medications Ordered in UC Medications - No data to display  Initial Impression / Assessment and Plan / UC Course  I have reviewed the triage vital signs and the nursing notes.  Pertinent labs & imaging results that were available during my care of the patient were reviewed by me and considered in my medical  decision making (see chart for details).    Assessment negative for red flags or concerns.  No signs of intracranial abnormality or CVA symptoms.  Likely concussion.  May take Ibuprofen and/or Tylenol as needed for headache.  Encouraged fluids and rest.  Strict ED follow up for any red flags.   Final Clinical Impressions(s) / UC Diagnoses   Final diagnoses:  Concussion without loss of consciousness, initial encounter     Discharge Instructions      You can take Ibuprofen and/or Tylenol as needed for pain relief.    Drink plenty of fluids and rest.   If you develop a worsening headache, dizziness, blurred vision, vomiting, difficulty talking, weakness, difficulty walking, or pass out, call 911 or go to the Emergency Department for further evaluation.   Follow up with your primary care provider as soon as you can.       ED Prescriptions   None    PDMP not reviewed this encounter.   Pearson Forster, NP 10/07/20 1426

## 2020-10-07 NOTE — Discharge Instructions (Addendum)
You can take Ibuprofen and/or Tylenol as needed for pain relief.    Drink plenty of fluids and rest.   If you develop a worsening headache, dizziness, blurred vision, vomiting, difficulty talking, weakness, difficulty walking, or pass out, call 911 or go to the Emergency Department for further evaluation.   Follow up with your primary care provider as soon as you can.

## 2020-10-07 NOTE — ED Triage Notes (Signed)
Pt presents with headache after hitting head on mantle in house xs 3 days. States has also had some nausea after hitting head.

## 2020-10-24 ENCOUNTER — Other Ambulatory Visit (HOSPITAL_COMMUNITY): Payer: Self-pay

## 2020-10-28 ENCOUNTER — Encounter: Payer: Medicare HMO | Admitting: Physical Therapy

## 2020-10-31 ENCOUNTER — Other Ambulatory Visit (HOSPITAL_COMMUNITY): Payer: Self-pay

## 2020-11-06 ENCOUNTER — Ambulatory Visit: Payer: Medicare HMO | Admitting: Physical Therapy

## 2020-11-13 ENCOUNTER — Encounter: Payer: Medicare HMO | Admitting: Physical Therapy

## 2020-11-27 ENCOUNTER — Other Ambulatory Visit (HOSPITAL_COMMUNITY): Payer: Self-pay

## 2020-12-02 ENCOUNTER — Other Ambulatory Visit (HOSPITAL_COMMUNITY): Payer: Self-pay

## 2020-12-25 ENCOUNTER — Other Ambulatory Visit (HOSPITAL_COMMUNITY): Payer: Self-pay

## 2020-12-26 ENCOUNTER — Other Ambulatory Visit (HOSPITAL_COMMUNITY): Payer: Self-pay

## 2021-01-22 ENCOUNTER — Other Ambulatory Visit (HOSPITAL_COMMUNITY): Payer: Self-pay

## 2021-01-23 ENCOUNTER — Other Ambulatory Visit (HOSPITAL_COMMUNITY): Payer: Self-pay

## 2021-01-28 ENCOUNTER — Other Ambulatory Visit (HOSPITAL_COMMUNITY): Payer: Self-pay

## 2021-02-08 ENCOUNTER — Other Ambulatory Visit: Payer: Self-pay

## 2021-02-08 ENCOUNTER — Ambulatory Visit (HOSPITAL_COMMUNITY)
Admission: EM | Admit: 2021-02-08 | Discharge: 2021-02-08 | Disposition: A | Discharge status: Home / Self Care | Payer: Medicare HMO | Attending: Internal Medicine | Admitting: Internal Medicine

## 2021-02-08 ENCOUNTER — Encounter (HOSPITAL_COMMUNITY): Payer: Self-pay | Admitting: *Deleted

## 2021-02-08 ENCOUNTER — Emergency Department (HOSPITAL_COMMUNITY)
Admission: EM | Admit: 2021-02-08 | Discharge: 2021-02-09 | Disposition: A | Discharge status: Home / Self Care | Payer: Medicare HMO | Attending: Emergency Medicine | Admitting: Emergency Medicine

## 2021-02-08 ENCOUNTER — Emergency Department (HOSPITAL_COMMUNITY): Payer: Medicare HMO

## 2021-02-08 ENCOUNTER — Encounter (HOSPITAL_COMMUNITY): Payer: Self-pay

## 2021-02-08 DIAGNOSIS — Z20822 Contact with and (suspected) exposure to covid-19: Secondary | ICD-10-CM | POA: Diagnosis not present

## 2021-02-08 DIAGNOSIS — N289 Disorder of kidney and ureter, unspecified: Secondary | ICD-10-CM | POA: Diagnosis not present

## 2021-02-08 DIAGNOSIS — Z87891 Personal history of nicotine dependence: Secondary | ICD-10-CM | POA: Insufficient documentation

## 2021-02-08 DIAGNOSIS — Z21 Asymptomatic human immunodeficiency virus [HIV] infection status: Secondary | ICD-10-CM | POA: Diagnosis not present

## 2021-02-08 DIAGNOSIS — J189 Pneumonia, unspecified organism: Secondary | ICD-10-CM | POA: Insufficient documentation

## 2021-02-08 DIAGNOSIS — R0689 Other abnormalities of breathing: Secondary | ICD-10-CM | POA: Insufficient documentation

## 2021-02-08 DIAGNOSIS — I10 Essential (primary) hypertension: Secondary | ICD-10-CM | POA: Diagnosis not present

## 2021-02-08 DIAGNOSIS — Z8541 Personal history of malignant neoplasm of cervix uteri: Secondary | ICD-10-CM | POA: Diagnosis not present

## 2021-02-08 DIAGNOSIS — Z79899 Other long term (current) drug therapy: Secondary | ICD-10-CM | POA: Diagnosis not present

## 2021-02-08 DIAGNOSIS — R059 Cough, unspecified: Secondary | ICD-10-CM | POA: Diagnosis present

## 2021-02-08 LAB — CBC WITH DIFFERENTIAL/PLATELET
Abs Immature Granulocytes: 0 10*3/uL (ref 0.00–0.07)
Basophils Absolute: 0 10*3/uL (ref 0.0–0.1)
Basophils Relative: 0 %
Eosinophils Absolute: 0 10*3/uL (ref 0.0–0.5)
Eosinophils Relative: 0 %
HCT: 49.4 % — ABNORMAL HIGH (ref 36.0–46.0)
Hemoglobin: 15.4 g/dL — ABNORMAL HIGH (ref 12.0–15.0)
Lymphocytes Relative: 19 %
Lymphs Abs: 2.3 10*3/uL (ref 0.7–4.0)
MCH: 27.3 pg (ref 26.0–34.0)
MCHC: 31.2 g/dL (ref 30.0–36.0)
MCV: 87.6 fL (ref 80.0–100.0)
Monocytes Absolute: 2.1 10*3/uL — ABNORMAL HIGH (ref 0.1–1.0)
Monocytes Relative: 17 %
Neutro Abs: 7.9 10*3/uL — ABNORMAL HIGH (ref 1.7–7.7)
Neutrophils Relative %: 64 %
Platelets: 234 10*3/uL (ref 150–400)
RBC: 5.64 MIL/uL — ABNORMAL HIGH (ref 3.87–5.11)
RDW: 12.6 % (ref 11.5–15.5)
WBC: 12.3 10*3/uL — ABNORMAL HIGH (ref 4.0–10.5)
nRBC: 0 % (ref 0.0–0.2)
nRBC: 0 /100 WBC

## 2021-02-08 LAB — BASIC METABOLIC PANEL
Anion gap: 13 (ref 5–15)
BUN: 22 mg/dL (ref 8–23)
CO2: 23 mmol/L (ref 22–32)
Calcium: 9.4 mg/dL (ref 8.9–10.3)
Chloride: 98 mmol/L (ref 98–111)
Creatinine, Ser: 1.36 mg/dL — ABNORMAL HIGH (ref 0.44–1.00)
GFR, Estimated: 44 mL/min — ABNORMAL LOW (ref >60)
Glucose, Bld: 124 mg/dL — ABNORMAL HIGH (ref 70–99)
Potassium: 3.6 mmol/L (ref 3.5–5.1)
Sodium: 134 mmol/L — ABNORMAL LOW (ref 135–145)

## 2021-02-08 LAB — BRAIN NATRIURETIC PEPTIDE: B Natriuretic Peptide: 51.9 pg/mL (ref 0.0–100.0)

## 2021-02-08 LAB — LACTIC ACID, PLASMA: Lactic Acid, Venous: 2 mmol/L (ref 0.5–1.9)

## 2021-02-08 MED ORDER — ACETAMINOPHEN 325 MG PO TABS
650.0000 mg | ORAL_TABLET | Freq: Once | ORAL | Status: AC
  Administered 2021-02-08: 650 mg via ORAL

## 2021-02-08 MED ORDER — ACETAMINOPHEN 325 MG PO TABS
ORAL_TABLET | ORAL | Status: AC
  Filled 2021-02-08: qty 2

## 2021-02-08 MED ORDER — SODIUM CHLORIDE 0.9 % IV SOLN
3.0000 g | Freq: Once | INTRAVENOUS | Status: AC
  Administered 2021-02-09: 02:00:00 3 g via INTRAVENOUS
  Filled 2021-02-08: qty 8

## 2021-02-08 MED ORDER — ACETAMINOPHEN 500 MG PO TABS
1000.0000 mg | ORAL_TABLET | Freq: Once | ORAL | Status: AC
  Administered 2021-02-09: 01:00:00 1000 mg via ORAL
  Filled 2021-02-08: qty 2

## 2021-02-08 MED ORDER — LACTATED RINGERS IV BOLUS
1000.0000 mL | Freq: Once | INTRAVENOUS | Status: AC
  Administered 2021-02-09: 01:00:00 1000 mL via INTRAVENOUS

## 2021-02-08 NOTE — ED Notes (Signed)
Lactic Acid 2.0 per lab.  Shirlee Limerick, [place] notified at triage.

## 2021-02-08 NOTE — ED Provider Notes (Signed)
[facility] Provider Note   CSN: [id number] Arrival date & time: 02/08/21  1509     History Chief Complaint  Patient presents with   Cough    Courtney Hamilton is a 60 y.o. female.  60 year old female who presents emerged from today with a cough.  This sounds like it started few days ago and slowly improved but then yesterday got a lot worse.  She has some green sputum with it as well.  She was seen in urgent care where she was found to be febrile, hypoxic and tachycardic so sent here for further evaluation.  Had ibuprofen prior to arrival here.  She has been normoxic since being here.  No lower extremity swelling.  No headache.  No other associated symptoms.   Cough     Past Medical History:  Diagnosis Date   Arthritis    right hip   Bronchitis last time  sept/oct 2021   Hepatitis 1980's   NON A NON B tx for 1980's resolved   Herniated disc 10/07/2011   lower back   History of chronic kidney disease 1980's   resolved   HIV (human immunodeficiency virus infection) ([place]) 1985   Hypertension    Lower GI bleed 05/10/2012   pt denies   Neuropathy    resolved   Osteoporosis    Ruptured lumbar disc    Sciatica    right side   Substance abuse ([place])    past hsitory  clean more than 20 years    TB (pulmonary tuberculosis) 1993    exposure, treated    VIN III (vulvar intraepithelial neoplasia III)     Patient Active Problem List   Diagnosis Date Noted   Carcinoma in situ of cervix 07/11/2019   Thickened endometrium    Cervical stenosis (uterine cervix)    Vulvar intraepithelial neoplasia (VIN) grade 3    Essential hypertension 08/05/2017   Fungal infection of skin of abdomen 06/16/2017   Flu-like symptoms 05/25/2016   Head injury 11/05/2015   Insomnia 11/05/2015   Former cigarette smoker 11/05/2015   Chronic low back pain without sciatica 02/18/2015   Fall 01/14/2015   Lower abdominal pain 10/08/2014   Generalized anxiety  disorder 06/04/2014   Anxiety state 01/07/2014   Panic attacks 12/22/2013   Lumbosacral spondylosis without myelopathy 09/29/2013   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   Lumbar paraspinal muscle spasm 01/16/2013   Reflux 12/13/2011   Lower leg edema 12/10/2011   Herpes simplex 10/07/2011   Peripheral neuropathy 10/07/2011   Hypercholesteremia 10/07/2011   History of tuberculosis exposure 10/01/2011   Recurrent UTI 08/06/2011   HIV positive ([place]) 08/03/2011   Sleep disorder 08/03/2011   Back pain 07/21/2011   Osteoporosis 03/09/2008    Past Surgical History:  Procedure Laterality Date   APPENDECTOMY  1980's   BREAST EXCISIONAL BIOPSY Left 2001 per pt   cyst removed    CHOLECYSTECTOMY  5956'L   CO2 LASER APPLICATION N/A 87/56/4332   Procedure: CO2 LASER APPLICATION OF VULVA (PERI-CLITORAL AND PERI-ANAL);  Surgeon: Lafonda Mosses, MD;  Location: [facility];  Service: Gynecology;  Laterality: N/A;   ECTOPIC PREGNANCY SURGERY  1985   injections to lower back  09/2017   LAPAROSCOPY N/A 07/03/2019   Procedure: LAPAROSCOPY DIAGNOSTIC; LYSIS OF ADHESIONS;  Surgeon: Lafonda Mosses, MD;  Location: [facility];  Service: Gynecology;  Laterality: N/A;   ROBOTIC ASSISTED TOTAL HYSTERECTOMY WITH BILATERAL SALPINGO OOPHERECTOMY N/A 07/03/2019  Procedure: XI ROBOTIC ASSISTED TOTAL HYSTERECTOMY WITH UNILATERAL SALPINGO OOPHORECTOMY;  Surgeon: Lafonda Mosses, MD;  Location: [facility];  Service: Gynecology;  Laterality: N/A;   SALIVARY GLAND SURGERY  1990   VULVECTOMY N/A 01/18/2018   Procedure: PARTIAL VULVECTOMY;  Surgeon: [name], MD;  Location: [facility];  Service: Gynecology;  Laterality: N/A;     OB History     Gravida  4   Para  1   Term  1   Preterm  0   AB  3   Living  1      SAB  1   IAB  1   Ectopic  1   Multiple  0   Live Births  1           Family History  Problem Relation Age of Onset    Asthma Father    COPD Father    Cancer Mother    Diabetes Brother    Hypertension Brother    Diabetes Brother    Breast cancer Sister    Colon cancer Maternal Uncle     Social History   Tobacco Use   Smoking status: Former    Packs/day: 0.50    Years: 30.00    Pack years: 15.00    Types: Cigarettes   Smokeless tobacco: Never   Tobacco comments:    quit march 2018  Vaping Use   Vaping Use: Never used  Substance Use Topics   Alcohol use: No   Drug use: Not Currently    Types: Marijuana    Comment: past history of cocaine  and alcohol  none in 20 years    Home Medications Prior to Admission medications   Medication Sig Start Date End Date Taking? Authorizing Provider  amoxicillin-clavulanate (AUGMENTIN) 875-125 MG tablet Take 1 tablet by mouth 2 (two) times daily. One po bid x 7 days 02/09/21  Yes Anarely Nicholls, Corene Cornea, MD  benzonatate (TESSALON) 100 MG capsule Take 1 capsule (100 mg total) by mouth 3 (three) times daily as needed for cough. 02/09/21  Yes Dyson Sevey, Corene Cornea, MD  acyclovir ointment (ZOVIRAX) 5 % Apply 1 application topically every 3 (three) hours. Patient taking differently: Apply 1 application topically every 3 (three) hours as needed (irritation.). 04/12/18   Sloan Leiter, MD  Ascorbic Acid (VITAMIN C) 1000 MG tablet Take 1,000 mg by mouth at bedtime.    [provider]  Biotin 1000 MCG tablet Take 2,000 mcg by mouth daily in the afternoon.    [provider]  dolutegravir (TIVICAY) 50 MG tablet TAKE 1 TABLET (50 MG TOTAL) BY MOUTH DAILY. 09/24/20 09/24/21  Carlyle Basques, MD  emtricitabine-tenofovir AF (DESCOVY) 200-25 MG tablet TAKE 1 TABLET BY MOUTH DAILY. 09/24/20 09/24/21  Carlyle Basques, MD  gabapentin (NEURONTIN) 300 MG capsule TAKE 1 CAPSULE BY MOUTH THREE TIMES A DAY Patient taking differently: Take 600 mg by mouth at bedtime. 12/18/18   Kirsteins, Luanna Salk, MD  ibuprofen (ADVIL) 800 MG tablet Take 1 tablet (800 mg total) by mouth every 8 (eight)  hours as needed for moderate pain. For AFTER surgery only 01/23/20   Joylene John D, NP  losartan (COZAAR) 50 MG tablet Take 50 mg by mouth daily. 11/11/19   [provider]  Multiple Minerals-Vitamins (CAL-MAG ZINC II PO) Take 1 tablet by mouth daily in the afternoon.    [provider]  Omega-3 Fatty Acids (FISH OIL PO) Take 2,000 mg by mouth daily as needed (hot flashes.).  [provider]  senna-docusate (SENOKOT-S) 8.6-50 MG tablet Take 2 tablets by mouth at bedtime. For AFTER surgery, do not take if having diarrhea 01/23/20   Cross, Lenna Sciara D, NP  traMADol (ULTRAM) 50 MG tablet Take 2 tablets (100 mg total) by mouth 3 (three) times daily as needed for moderate pain. 02/21/18   Lovenia Kim, MD  traMADol HCl 100 MG TABS Take by mouth. 07/10/20   [provider]  zolpidem (AMBIEN) 10 MG tablet Take 1 tablet (10 mg total) by mouth at bedtime as needed for up to 20 days for sleep. 03/22/19 01/22/20  Carlyle Basques, MD    Allergies    Baclofen, Bactrim [sulfamethoxazole-trimethoprim], and Doxycycline  Review of Systems   Review of Systems  Respiratory:  Positive for cough.   All other systems reviewed and are negative.  Physical Exam Updated Vital Signs BP 97/68 (BP Location: Right Arm)   Pulse 95   Temp 98 F (36.7 C)   Resp (!) 23   Ht 5\' 7"  (1.702 m)   Wt 78.9 kg   SpO2 95%   BMI 27.24 kg/m   Physical Exam Vitals and nursing note reviewed.  Constitutional:      Appearance: She is well-developed.  HENT:     Head: Normocephalic and atraumatic.     Mouth/Throat:     Mouth: Mucous membranes are moist.     Pharynx: Oropharynx is clear.  Eyes:     Pupils: Pupils are equal, round, and reactive to light.  Cardiovascular:     Rate and Rhythm: Regular rhythm. Tachycardia present.  Pulmonary:     Effort: Tachypnea present. No respiratory distress.     Breath sounds: No stridor.  Abdominal:     General: Abdomen is flat. There is no  distension.  Musculoskeletal:        General: No swelling or tenderness. Normal range of motion.     Cervical back: Normal range of motion.  Skin:    General: Skin is warm and dry.  Neurological:     General: No focal deficit present.     Mental Status: She is alert.    ED Results / Procedures / Treatments   Labs (all labs ordered are listed, but only abnormal results are displayed) Labs Reviewed  RESP PANEL BY RT-PCR (FLU A&B, COVID) ARPGX2 - Abnormal; Notable for the following components:      Result Value   Influenza A by PCR POSITIVE (*)    All other components within normal limits  BASIC METABOLIC PANEL - Abnormal; Notable for the following components:   Sodium 134 (*)    Glucose, Bld 124 (*)    Creatinine, Ser 1.36 (*)    GFR, Estimated 44 (*)    All other components within normal limits  CBC WITH DIFFERENTIAL/PLATELET - Abnormal; Notable for the following components:   WBC 12.3 (*)    RBC 5.64 (*)    Hemoglobin 15.4 (*)    HCT 49.4 (*)    Neutro Abs 7.9 (*)    Monocytes Absolute 2.1 (*)    All other components within normal limits  LACTIC ACID, PLASMA - Abnormal; Notable for the following components:   Lactic Acid, Venous 2.0 (*)    All other components within normal limits  LACTIC ACID, PLASMA - Abnormal; Notable for the following components:   Lactic Acid, Venous 2.2 (*)    All other components within normal limits  BRAIN NATRIURETIC PEPTIDE    EKG None  Radiology DG  Chest 2 View  Result Date: 02/08/2021 CLINICAL DATA:  Pneumonia. EXAM: CHEST - 2 VIEW COMPARISON:  April 01, 2014 FINDINGS: Cardiomediastinal silhouette is normal. Mediastinal contours appear intact. Minimal peribronchial airspace consolidation in the lower lobes. Osseous structures are without acute abnormality. Soft tissues are grossly normal. IMPRESSION: Minimal peribronchial airspace consolidation in the lower lobes may represent acute bronchitis or early atypical pneumonia. Electronically  Signed   By: Fidela Salisbury M.D.   On: 02/08/2021 16:51    Procedures Procedures   Medications Ordered in ED Medications  lactated ringers bolus 1,000 mL (1,000 mLs Intravenous New Bag/Given 02/09/21 0056)  acetaminophen (TYLENOL) tablet 1,000 mg (1,000 mg Oral Given 02/09/21 0105)  Ampicillin-Sulbactam (UNASYN) 3 g in sodium chloride 0.9 % 100 mL IVPB (0 g Intravenous Stopped 02/09/21 0222)    ED Course  I have reviewed the triage vital signs and the nursing notes.  Pertinent labs & imaging results that were available during my care of the patient were reviewed by me and considered in my medical decision making (see chart for details).  Clinical Course as of 02/09/21 0421  Sat Feb 08, 2021  1748 Lactic Acid, Venous(!!): 2.0 [GL]  1748 WBC(!): 12.3 [GL]    Clinical Course User Index [GL] Loeffler, Adora Fridge, PA-C   MDM Rules/Calculators/A&P                         Suspect patient has Community acquired pneumonia.  Will initiate treatment for the same.  She has a couple allergies so we will use Unasyn/Augmentin on discharge.  We will ensure that her oxygen levels are okay and her heart rate stays okay first. Will start some fluids for her minor renal insufficiency.  Patient not hypoxic, ambulated without hypoxia or tachypnea.  Patient states she feels better.  Lactic acid slightly elevated but no evidence of sepsis or septic shock.  Patient stable for discharge with PCP follow-up for recheck of her pneumonia and kidney function.    Final Clinical Impression(s) / ED Diagnoses Final diagnoses:  Renal insufficiency, mild  Community acquired pneumonia, unspecified laterality    Rx / DC Orders ED Discharge Orders          Ordered    amoxicillin-clavulanate (AUGMENTIN) 875-125 MG tablet  2 times daily        02/09/21 0419    benzonatate (TESSALON) 100 MG capsule  3 times daily PRN        02/09/21 0419             Janelli Welling, Corene Cornea, MD 02/09/21 2321

## 2021-02-08 NOTE — ED Triage Notes (Addendum)
Pt c/o cough and congestion x2wks. States saw pcp and her lungs were clear and given mucinex. States now having a productive cough with brown mucus, chest congestion and headache. Pt c/o SOB at times. No distress noted at this time. Pt speaking in complete sentences.

## 2021-02-08 NOTE — ED Triage Notes (Signed)
Pt sent here  for treatment.  The pt has multiple complaints  cough cold productive cough thick green  temp sob  she saw  saw her doctor  the 23rd of November for as mild xase od the same symptoms  but the past 2-3 days her symptoms haVE BECOME WORSE.  HEADACHE ALSO

## 2021-02-08 NOTE — ED Provider Notes (Signed)
Emergency Medicine Provider Triage Evaluation Note  Yatzary Merriweather , a 60 y.o. female  was evaluated in triage.  Pt complains of cough that began yesterday.  Cough is been productive with green sputum.  She was seen today in urgent care.  At the time, she was hypoxic and they advised that she go to the emergency room.  Patient endorses increased shortness of breath today.  She has been febrile.  She was given ibuprofen at the urgent care.  Review of Systems  Positive: See above Negative:   Physical Exam  BP (!) 133/92 (BP Location: Left Arm)   Pulse (!) 118   Temp 100.2 F (37.9 C) (Oral)   Resp 19   Ht 5\' 7"  (1.702 m)   Wt 78.9 kg   SpO2 92%   BMI 27.24 kg/m  Gen:   Awake, no distress Resp:  Dyspnea at rest.  Lung sounds coarse bilaterally. MSK:   Moves extremities without difficulty  Other:  No leg swelling noted bilateral lower extremity.  Medical Decision Making  Medically screening exam initiated at 4:13 PM.  Appropriate orders placed.  Rasheda Weinert was informed that the remainder of the evaluation will be completed by another provider, this initial triage assessment does not replace that evaluation, and the importance of remaining in the ED until their evaluation is complete.  Suspect pneumonia.  Work-up initiated.  She was 92% on room air I checked her oxygen level.   Sheila Oats 02/08/21 1615    Carmin Muskrat, MD 02/08/21 [phone]

## 2021-02-08 NOTE — ED Notes (Signed)
Patient is being discharged from the Urgent Care and sent to the Emergency Department via POV . Per Rachael, PA, patient is in need of higher level of care due to critical vital signs. Patient is aware and verbalizes understanding of plan of care.  Vitals:   02/08/21 1448  BP: (!) 146/86  Pulse: (!) 136  Resp: 18  Temp: (!) 103.1 F (39.5 C)  SpO2: (!) 87%

## 2021-02-09 DIAGNOSIS — J189 Pneumonia, unspecified organism: Secondary | ICD-10-CM | POA: Diagnosis not present

## 2021-02-09 LAB — LACTIC ACID, PLASMA: Lactic Acid, Venous: 2.2 mmol/L (ref 0.5–1.9)

## 2021-02-09 LAB — RESP PANEL BY RT-PCR (FLU A&B, COVID) ARPGX2
Influenza A by PCR: POSITIVE — AB
Influenza B by PCR: NEGATIVE
SARS Coronavirus 2 by RT PCR: NEGATIVE

## 2021-02-09 MED ORDER — AMOXICILLIN-POT CLAVULANATE 875-125 MG PO TABS: 1.0000 | ORAL_TABLET | Freq: Two times a day (BID) | ORAL | 0 refills | Status: DC

## 2021-02-09 MED ORDER — BENZONATATE 100 MG PO CAPS: 100.0000 mg | ORAL_CAPSULE | Freq: Three times a day (TID) | ORAL | 0 refills | Status: DC | PRN

## 2021-02-09 NOTE — ED Notes (Signed)
PT ambulated in room with no noted difficulty. PT's O2 saturation remained at 95% on room air throughout ambulation.

## 2021-02-18 ENCOUNTER — Encounter: Payer: Self-pay | Admitting: Internal Medicine

## 2021-02-18 ENCOUNTER — Other Ambulatory Visit: Payer: Self-pay

## 2021-02-18 ENCOUNTER — Ambulatory Visit (INDEPENDENT_AMBULATORY_CARE_PROVIDER_SITE_OTHER): Payer: Medicare HMO | Admitting: Internal Medicine

## 2021-02-18 VITALS — BP 103/67 | HR 64 | Temp 97.8°F | Ht 66.0 in | Wt 170.0 lb

## 2021-02-18 DIAGNOSIS — Z79899 Other long term (current) drug therapy: Secondary | ICD-10-CM

## 2021-02-18 DIAGNOSIS — B20 Human immunodeficiency virus [HIV] disease: Secondary | ICD-10-CM

## 2021-02-18 DIAGNOSIS — J029 Acute pharyngitis, unspecified: Secondary | ICD-10-CM | POA: Diagnosis not present

## 2021-02-18 NOTE — Progress Notes (Signed)
RFV: follow up for hiv disease  Patient ID: Courtney Hamilton, female   DOB: 06/23/1960, 60 y.o.   MRN: [id number]  HPI 59yo f with hiv disease on tivicay-descovy, 518/VL<02 (in may).  She started to have some symptoms after thanksgiving, but porgressively had worsening shortness of breath, fever, cough, and myalgias- went to the ED for 15hrs and found to have flu with pneumonia in the early part of December. Soreness from coughing. Still feeling tired. Feels 75% better. Lost 10 lb over 2 weeks of being ill. Still has some residual sore throat, and LN+. Missed 2 doses during the period she was sick.  Outpatient Encounter Medications as of 02/18/2021  Medication Sig   acyclovir ointment (ZOVIRAX) 5 % Apply 1 application topically every 3 (three) hours. (Patient taking differently: Apply 1 application topically every 3 (three) hours as needed (irritation.).)   Ascorbic Acid (VITAMIN C) 1000 MG tablet Take 1,000 mg by mouth at bedtime.   Biotin 1000 MCG tablet Take 2,000 mcg by mouth daily in the afternoon.   dolutegravir (TIVICAY) 50 MG tablet TAKE 1 TABLET (50 MG TOTAL) BY MOUTH DAILY.   emtricitabine-tenofovir AF (DESCOVY) 200-25 MG tablet TAKE 1 TABLET BY MOUTH DAILY.   gabapentin (NEURONTIN) 300 MG capsule TAKE 1 CAPSULE BY MOUTH THREE TIMES A DAY (Patient taking differently: Take 600 mg by mouth at bedtime.)   losartan (COZAAR) 50 MG tablet Take 50 mg by mouth daily.   Multiple Minerals-Vitamins (CAL-MAG ZINC II PO) Take 1 tablet by mouth daily in the afternoon.   Omega-3 Fatty Acids (FISH OIL PO) Take 2,000 mg by mouth daily as needed (hot flashes.).   senna-docusate (SENOKOT-S) 8.6-50 MG tablet Take 2 tablets by mouth at bedtime. For AFTER surgery, do not take if having diarrhea   traMADol (ULTRAM) 50 MG tablet Take 2 tablets (100 mg total) by mouth 3 (three) times daily as needed for moderate pain.   amoxicillin-clavulanate (AUGMENTIN) 875-125 MG tablet Take 1 tablet by mouth 2 (two) times  daily. One po bid x 7 days (Patient not taking: Reported on 02/18/2021)   benzonatate (TESSALON) 100 MG capsule Take 1 capsule (100 mg total) by mouth 3 (three) times daily as needed for cough. (Patient not taking: Reported on 02/18/2021)   ibuprofen (ADVIL) 800 MG tablet Take 1 tablet (800 mg total) by mouth every 8 (eight) hours as needed for moderate pain. For AFTER surgery only (Patient not taking: Reported on 02/18/2021)   traMADol HCl 100 MG TABS Take by mouth. (Patient not taking: Reported on 02/18/2021)   zolpidem (AMBIEN) 10 MG tablet Take 1 tablet (10 mg total) by mouth at bedtime as needed for up to 20 days for sleep.   Facility-Administered Encounter Medications as of 02/18/2021  Medication   acyclovir ointment (ZOVIRAX) 5 %     Patient Active Problem List   Diagnosis Date Noted   Carcinoma in situ of cervix 07/11/2019   Thickened endometrium    Cervical stenosis (uterine cervix)    Vulvar intraepithelial neoplasia (VIN) grade 3    Essential hypertension 08/05/2017   Fungal infection of skin of abdomen 06/16/2017   Flu-like symptoms 05/25/2016   Head injury 11/05/2015   Insomnia 11/05/2015   Former cigarette smoker 11/05/2015   Chronic low back pain without sciatica 02/18/2015   Fall 01/14/2015   Lower abdominal pain 10/08/2014   Generalized anxiety disorder 06/04/2014   Anxiety state 01/07/2014   Panic attacks 12/22/2013   Lumbosacral spondylosis without myelopathy 09/29/2013  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 04/18/2013   Lumbar paraspinal muscle spasm 01/16/2013   Reflux 12/13/2011   Lower leg edema 12/10/2011   Herpes simplex 10/07/2011   Peripheral neuropathy 10/07/2011   Hypercholesteremia 10/07/2011   History of tuberculosis exposure 10/01/2011   Recurrent UTI 08/06/2011   HIV positive ([place]) 08/03/2011   Sleep disorder 08/03/2011   Back pain 07/21/2011   Osteoporosis 03/09/2008     Health Maintenance Due  Topic Date Due   Zoster Vaccines-  Shingrix (1 of 2) Never done   TETANUS/TDAP  05/22/2017   COVID-19 Vaccine (3 - Pfizer risk series) 07/18/2019   INFLUENZA VACCINE  10/07/2020     Review of Systems 12 point ros except mentioned in hpi Physical Exam   BP 103/67    Pulse 64    Temp 97.8 F (36.6 C) (Oral)    Ht 5\' 6"  (1.676 m)    Wt 170 lb (77.1 kg)    SpO2 100%    BMI 27.44 kg/m   Physical Exam  Constitutional:  oriented to person, place, and time. appears well-developed and well-nourished. No distress.  HENT: [place]/AT, PERRLA, no scleral icterus Mouth/Throat: Oropharynx is clear and moist. No oropharyngeal exudate.  Cardiovascular: Normal rate, regular rhythm and normal heart sounds. Exam reveals no gallop and no friction rub.  No murmur heard.  Pulmonary/Chest: Effort normal and breath sounds normal. No respiratory distress.  has no wheezes.  Neck = supple, no nuchal rigidity Abdominal: Soft. Bowel sounds are normal.  exhibits no distension. There is no tenderness.  Lymphadenopathy: no cervical adenopathy. No axillary adenopathy Neurological: alert and oriented to person, place, and time.  Skin: Skin is warm and dry. No rash noted. No erythema.  Psychiatric: a normal mood and affect.  behavior is normal.   Lab Results  Component Value Date   CD4TCELL 21 (L) 08/08/2020   Lab Results  Component Value Date   CD4TABS 518 08/08/2020   CD4TABS 369 (L) 11/20/2019   CD4TABS 494 03/15/2019   Lab Results  Component Value Date   HIV1RNAQUANT Not Detected 08/08/2020   Lab Results  Component Value Date   HEPBSAB REACTIVE (A) 05/18/2017   Lab Results  Component Value Date   LABRPR NON-REACTIVE 08/08/2020    CBC Lab Results  Component Value Date   WBC 12.3 (H) 02/08/2021   RBC 5.64 (H) 02/08/2021   HGB 15.4 (H) 02/08/2021   HCT 49.4 (H) 02/08/2021   PLT 234 02/08/2021   MCV 87.6 02/08/2021   MCH 27.3 02/08/2021   MCHC 31.2 02/08/2021   RDW 12.6 02/08/2021   LYMPHSABS 2.3 02/08/2021   MONOABS 2.1 (H)  02/08/2021   EOSABS 0.0 02/08/2021    BMET Lab Results  Component Value Date   NA 134 (L) 02/08/2021   K 3.6 02/08/2021   CL 98 02/08/2021   CO2 23 02/08/2021   GLUCOSE 124 (H) 02/08/2021   BUN 22 02/08/2021   CREATININE 1.36 (H) 02/08/2021   CALCIUM 9.4 02/08/2021   GFRNONAA 44 (L) 02/08/2021   GFRAA 70 08/08/2020      Assessment and Plan HIV disease= will check cd 4 count and viral load  Long term medication management = cr at baseline  Sore throat= physical exam shows tonsilar erythema, we will Check for strep throat  Get covid booster in a few weeks

## 2021-02-19 LAB — T-HELPER CELL (CD4) - (RCID CLINIC ONLY)
CD4 % Helper T Cell: 19 % — ABNORMAL LOW (ref 33–65)
CD4 T Cell Abs: 656 /uL (ref 400–1790)

## 2021-02-20 LAB — BASIC METABOLIC PANEL
BUN: 9 mg/dL (ref 7–25)
CO2: 28 mmol/L (ref 20–32)
Calcium: 9.4 mg/dL (ref 8.6–10.4)
Chloride: 102 mmol/L (ref 98–110)
Creat: 0.86 mg/dL (ref 0.50–1.05)
Glucose, Bld: 75 mg/dL (ref 65–99)
Potassium: 4.4 mmol/L (ref 3.5–5.3)
Sodium: 139 mmol/L (ref 135–146)

## 2021-02-20 LAB — CBC WITH DIFFERENTIAL/PLATELET
Absolute Monocytes: 780 cells/uL (ref 200–950)
Basophils Absolute: 47 cells/uL (ref 0–200)
Basophils Relative: 0.5 %
Eosinophils Absolute: 235 cells/uL (ref 15–500)
Eosinophils Relative: 2.5 %
HCT: 37.2 % (ref 35.0–45.0)
Hemoglobin: 12 g/dL (ref 11.7–15.5)
Lymphs Abs: 3299 cells/uL (ref 850–3900)
MCH: 28 pg (ref 27.0–33.0)
MCHC: 32.3 g/dL (ref 32.0–36.0)
MCV: 86.9 fL (ref 80.0–100.0)
MPV: 10 fL (ref 7.5–12.5)
Monocytes Relative: 8.3 %
Neutro Abs: 5038 cells/uL (ref 1500–7800)
Neutrophils Relative %: 53.6 %
Platelets: 499 10*3/uL — ABNORMAL HIGH (ref 140–400)
RBC: 4.28 10*6/uL (ref 3.80–5.10)
RDW: 11.8 % (ref 11.0–15.0)
Total Lymphocyte: 35.1 %
WBC: 9.4 10*3/uL (ref 3.8–10.8)

## 2021-02-20 LAB — HIV-1 RNA QUANT-NO REFLEX-BLD
HIV 1 RNA Quant: NOT DETECTED Copies/mL
HIV-1 RNA Quant, Log: NOT DETECTED Log cps/mL

## 2021-02-21 LAB — AEROBIC CULTURE
AER RESULT:: NORMAL
MICRO NUMBER:: 12755072
SPECIMEN QUALITY:: ADEQUATE

## 2021-02-24 ENCOUNTER — Other Ambulatory Visit (HOSPITAL_COMMUNITY): Payer: Self-pay

## 2021-02-28 ENCOUNTER — Telehealth: Payer: Self-pay

## 2021-02-28 NOTE — Telephone Encounter (Signed)
Patient called requesting medication for thrush. Patient states she tried contacting her PCP but was unsuccessful. Advised patient that if she feels like it is getting worse go to urgent care. Patient verbalized understanding. Eugenia Mcalpine

## 2021-03-01 ENCOUNTER — Ambulatory Visit (HOSPITAL_COMMUNITY)
Admission: EM | Admit: 2021-03-01 | Discharge: 2021-03-01 | Disposition: A | Discharge status: Home / Self Care | Payer: Medicare HMO | Attending: Internal Medicine | Admitting: Internal Medicine

## 2021-03-01 ENCOUNTER — Encounter (HOSPITAL_COMMUNITY): Payer: Self-pay

## 2021-03-01 DIAGNOSIS — B370 Candidal stomatitis: Secondary | ICD-10-CM | POA: Diagnosis not present

## 2021-03-01 MED ORDER — FLUCONAZOLE 150 MG PO TABS: 150.0000 mg | ORAL_TABLET | Freq: Every day | ORAL | 0 refills | Status: DC

## 2021-03-01 NOTE — ED Triage Notes (Signed)
Pt c/o sore throat  Denies cough, congestion, earache, headache, body aches or chills, nausea, vomiting, diarrhea, constipation.   States came to this UC earlier in the month dx w/ PNA and flu. Completed tx. States thinks she has thrush.   Onset ~ 12th of Dec.

## 2021-03-01 NOTE — ED Provider Notes (Signed)
Courtney Hamilton    CSN: [id number] Arrival date & time: 03/01/21  1040      History   Chief Complaint Chief Complaint  Patient presents with   Sore Throat    HPI Courtney Hamilton is a 60 y.o. female who presents with sore thoat and  she is certain she has thrush .  She has been on Augmentin earlier this month for pneumonia. She started feeling ST yesterday, but denies fever or URI symptoms.     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    TB (pulmonary tuberculosis) 1993    exposure, treated    VIN III (vulvar intraepithelial neoplasia III)     Patient Active Problem List   Diagnosis Date Noted   Carcinoma in situ of cervix 07/11/2019   Thickened endometrium    Cervical stenosis (uterine cervix)    Vulvar intraepithelial neoplasia (VIN) grade 3    Essential hypertension 08/05/2017   Fungal infection of skin of abdomen 06/16/2017   Flu-like symptoms 05/25/2016   Head injury 11/05/2015   Insomnia 11/05/2015   Former cigarette smoker 11/05/2015   Chronic low back pain without sciatica 02/18/2015   Fall 01/14/2015   Lower abdominal pain 10/08/2014   Generalized anxiety disorder 06/04/2014   Anxiety state 01/07/2014   Panic attacks 12/22/2013   Lumbosacral spondylosis without myelopathy 09/29/2013   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   Lumbar paraspinal muscle spasm 01/16/2013   Reflux 12/13/2011   Lower leg edema 12/10/2011   Herpes simplex 10/07/2011    Peripheral neuropathy 10/07/2011   Hypercholesteremia 10/07/2011   History of tuberculosis exposure 10/01/2011   Recurrent UTI 08/06/2011   HIV positive ([place]) 08/03/2011   Sleep disorder 08/03/2011   Back pain 07/21/2011   Osteoporosis 03/09/2008    Past Surgical History:  Procedure Laterality Date   APPENDECTOMY  1980's   BREAST EXCISIONAL BIOPSY Left 2001 per pt   cyst removed    CHOLECYSTECTOMY  2595'G   CO2 LASER APPLICATION N/A 38/75/6433   Procedure: CO2 LASER APPLICATION OF VULVA (PERI-CLITORAL AND PERI-ANAL);  Surgeon: Lafonda Mosses, MD;  Location: [facility];  Service: Gynecology;  Laterality: N/A;   ECTOPIC PREGNANCY SURGERY  1985   injections to lower back  09/2017   LAPAROSCOPY N/A 07/03/2019   Procedure: LAPAROSCOPY DIAGNOSTIC; LYSIS OF ADHESIONS;  Surgeon: Lafonda Mosses, MD;  Location: [facility];  Service: Gynecology;  Laterality: N/A;   ROBOTIC ASSISTED TOTAL HYSTERECTOMY WITH BILATERAL SALPINGO OOPHERECTOMY N/A 07/03/2019   Procedure: XI ROBOTIC ASSISTED TOTAL HYSTERECTOMY WITH UNILATERAL SALPINGO OOPHORECTOMY;  Surgeon: Lafonda Mosses, MD;  Location: [facility];  Service: Gynecology;  Laterality: N/A;   SALIVARY GLAND SURGERY  1990   VULVECTOMY N/A 01/18/2018   Procedure: PARTIAL VULVECTOMY;  Surgeon: [name], MD;  Location[facility];  Service: Gynecology;  Laterality: N/A;    OB History     Gravida  4   Para  1   Term  1   Preterm  0   AB  3   Living  1      SAB  1   IAB  1   Ectopic  1   Multiple  0   Live Births  1            Home Medications    Prior to Admission medications   Medication Sig Start Date End Date Taking? Authorizing Provider  fluconazole (DIFLUCAN) 150 MG tablet Take 1 tablet (150 mg total) by mouth daily. 03/01/21  Yes Rodriguez-Southworth, Sunday Spillers, PA-C  acyclovir ointment (ZOVIRAX) 5 % Apply 1 application topically every 3 (three) hours. Patient taking  differently: Apply 1 application topically every 3 (three) hours as needed (irritation.). 04/12/18   Sloan Leiter, MD  Ascorbic Acid (VITAMIN C) 1000 MG tablet Take 1,000 mg by mouth at bedtime.    [provider]  Biotin 1000 MCG tablet Take 2,000 mcg by mouth daily in the afternoon.    [provider]  dolutegravir (TIVICAY) 50 MG tablet TAKE 1 TABLET (50 MG TOTAL) BY MOUTH DAILY. 09/24/20 09/24/21  Carlyle Basques, MD  emtricitabine-tenofovir AF (DESCOVY) 200-25 MG tablet TAKE 1 TABLET BY MOUTH DAILY. 09/24/20 09/24/21  Carlyle Basques, MD  gabapentin (NEURONTIN) 300 MG capsule TAKE 1 CAPSULE BY MOUTH THREE TIMES A DAY Patient taking differently: Take 600 mg by mouth at bedtime. 12/18/18   Kirsteins, Luanna Salk, MD  losartan (COZAAR) 50 MG tablet Take 50 mg by mouth daily. 11/11/19   [provider]  Multiple Minerals-Vitamins (CAL-MAG ZINC II PO) Take 1 tablet by mouth daily in the afternoon.    [provider]  Omega-3 Fatty Acids (FISH OIL PO) Take 2,000 mg by mouth daily as needed (hot flashes.).    [provider]  senna-docusate (SENOKOT-S) 8.6-50 MG tablet Take 2 tablets by mouth at bedtime. For AFTER surgery, do not take if having diarrhea 01/23/20   Cross, Lenna Sciara D, NP  traMADol (ULTRAM) 50 MG tablet Take 2 tablets (100 mg total) by mouth 3 (three) times daily as needed for moderate pain. 02/21/18   Lovenia Kim, MD    Family History Family History  Problem Relation Age of Onset   Asthma Father    COPD Father    Cancer Mother    Diabetes Brother    Hypertension Brother    Diabetes Brother    Breast cancer Sister    Colon cancer Maternal Uncle     Social History Social History   Tobacco Use   Smoking status: Former    Packs/day: 0.50    Years: 30.00    Pack years: 15.00    Types: Cigarettes   Smokeless tobacco: Never   Tobacco comments:    quit march 2018  Vaping Use   Vaping Use: Never used  Substance Use Topics   Alcohol  use: No   Drug use: Not Currently    Types: Marijuana    Comment: past history of cocaine  and alcohol  none in 20 years     Allergies   Baclofen, Bactrim [sulfamethoxazole-trimethoprim], and Doxycycline   Review of Systems Review of Systems ST, the rest is neg.   Physical Exam Triage Vital Signs ED Triage Vitals  Enc Vitals Group     BP 03/01/21 1148 137/88     Pulse Rate  03/01/21 1148 68     Resp 03/01/21 1148 18     Temp 03/01/21 1148 98.9 F (37.2 C)     Temp Source 03/01/21 1148 Oral     SpO2 03/01/21 1148 98 %     Weight --      Height --      Head Circumference --      Peak Flow --      Pain Score 03/01/21 1149 0     Pain Loc --      Pain Edu? --      Excl. in [place]? --    No data found.  Updated Vital Signs BP 137/88 (BP Location: Left Arm)    Pulse 68    Temp 98.9 F (37.2 C) (Oral)    Resp 18    SpO2 98%   Visual Acuity Right Eye Distance:   Left Eye Distance:   Bilateral Distance:    Right Eye Near:   Left Eye Near:    Bilateral Near:     Physical Exam Vitals and nursing note reviewed.  Constitutional:      General: She is not in acute distress.    Appearance: She is not toxic-appearing.  HENT:     Right Ear: External ear normal.     Left Ear: External ear normal.     Mouth/Throat:     Mouth: Mucous membranes are moist.     Tonsils: No tonsillar exudate or tonsillar abscesses.     Comments: Has white patches on soft palate and L lateral tongue  Eyes:     General: No scleral icterus.    Conjunctiva/sclera: Conjunctivae normal.  Pulmonary:     Effort: Pulmonary effort is normal.  Musculoskeletal:     Cervical back: Neck supple.  Lymphadenopathy:     Cervical: No cervical adenopathy.  Skin:    General: Skin is warm and dry.  Neurological:     Mental Status: She is alert and oriented to person, place, and time.  Psychiatric:        Mood and Affect: Mood normal.        Behavior: Behavior normal.        Thought Content: Thought content  normal.        Judgment: Judgment normal.     UC Treatments / Results  Labs (all labs ordered are listed, but only abnormal results are displayed) Labs Reviewed - No data to display  EKG   Radiology No results found.  Procedures Procedures (including critical care time)  Medications Ordered in UC Medications - No data to display  Initial Impression / Assessment and Plan / UC Course  I have reviewed the triage vital signs and the nursing notes. Oral candida I placed her on Diflucan as noted     Final Clinical Impressions(s) / UC Diagnoses   Final diagnoses:  Thrush   Discharge Instructions   None    ED Prescriptions     Medication Sig Dispense Auth. Provider   fluconazole (DIFLUCAN) 150 MG tablet Take 1 tablet (150 mg total) by mouth daily. 3 tablet Rodriguez-Southworth, Sunday Spillers, PA-C      PDMP not reviewed this encounter.   Shelby Mattocks, PA-C 03/01/21 1213

## 2021-03-05 ENCOUNTER — Other Ambulatory Visit (HOSPITAL_COMMUNITY): Payer: Self-pay

## 2021-03-13 ENCOUNTER — Ambulatory Visit: Payer: Medicare HMO | Admitting: Obstetrics and Gynecology

## 2021-03-31 ENCOUNTER — Other Ambulatory Visit (HOSPITAL_COMMUNITY): Payer: Self-pay

## 2021-03-31 ENCOUNTER — Other Ambulatory Visit: Payer: Self-pay | Admitting: Internal Medicine

## 2021-03-31 DIAGNOSIS — Z21 Asymptomatic human immunodeficiency virus [HIV] infection status: Secondary | ICD-10-CM

## 2021-03-31 MED ORDER — TIVICAY 50 MG PO TABS
ORAL_TABLET | Freq: Every day | ORAL | 2 refills | Status: DC
  Filled 2021-04-02: qty 30, 30d supply, fill #0
  Filled 2021-04-29: qty 30, 30d supply, fill #1
  Filled 2021-06-05: qty 30, 30d supply, fill #2

## 2021-03-31 MED ORDER — DESCOVY 200-25 MG PO TABS
1.0000 | ORAL_TABLET | Freq: Every day | ORAL | 2 refills | Status: DC
  Filled 2021-04-02: qty 30, 30d supply, fill #0
  Filled 2021-04-29: qty 30, 30d supply, fill #1
  Filled 2021-05-27: qty 30, 30d supply, fill #2

## 2021-04-02 ENCOUNTER — Other Ambulatory Visit (HOSPITAL_COMMUNITY): Payer: Self-pay

## 2021-04-03 ENCOUNTER — Other Ambulatory Visit (HOSPITAL_COMMUNITY)
Admission: RE | Admit: 2021-04-03 | Discharge: 2021-04-03 | Disposition: A | Discharge status: Home / Self Care | Payer: Medicare HMO | Source: Ambulatory Visit | Attending: Obstetrics | Admitting: Obstetrics

## 2021-04-03 ENCOUNTER — Ambulatory Visit (INDEPENDENT_AMBULATORY_CARE_PROVIDER_SITE_OTHER): Payer: Medicare HMO | Admitting: Obstetrics

## 2021-04-03 ENCOUNTER — Encounter: Payer: Self-pay | Admitting: Obstetrics

## 2021-04-03 ENCOUNTER — Other Ambulatory Visit: Payer: Self-pay

## 2021-04-03 VITALS — BP 148/84 | HR 51 | Ht 66.0 in | Wt 171.0 lb

## 2021-04-03 DIAGNOSIS — N898 Other specified noninflammatory disorders of vagina: Secondary | ICD-10-CM

## 2021-04-03 DIAGNOSIS — N3941 Urge incontinence: Secondary | ICD-10-CM | POA: Diagnosis not present

## 2021-04-03 DIAGNOSIS — Z90710 Acquired absence of both cervix and uterus: Secondary | ICD-10-CM

## 2021-04-03 DIAGNOSIS — Z1239 Encounter for other screening for malignant neoplasm of breast: Secondary | ICD-10-CM

## 2021-04-03 DIAGNOSIS — Z01419 Encounter for gynecological examination (general) (routine) without abnormal findings: Secondary | ICD-10-CM | POA: Diagnosis not present

## 2021-04-03 DIAGNOSIS — Z90721 Acquired absence of ovaries, unilateral: Secondary | ICD-10-CM

## 2021-04-03 NOTE — Progress Notes (Signed)
Subjective:        Courtney Hamilton is a 61 y.o. female here for a routine exam.  Current complaints: Urinary urgency and incontinence with cough, sneeze, etc..    Personal health questionnaire:  Is patient Ashkenazi Jewish, have a family history of breast and/or ovarian cancer: yes Is there a family history of uterine cancer diagnosed at age < 39, gastrointestinal cancer, urinary tract cancer, family member who is a Field seismologist syndrome-associated carrier: no Is the patient overweight and hypertensive, family history of diabetes, personal history of gestational diabetes, preeclampsia or PCOS: yes Is patient over 59, have PCOS,  family history of premature CHD under age 14, diabetes, smoke, have hypertension or peripheral artery disease:  no At any time, has a partner hit, kicked or otherwise hurt or frightened you?: no Over the past 2 weeks, have you felt down, depressed or hopeless?: no Over the past 2 weeks, have you felt little interest or pleasure in doing things?:no   Gynecologic History No LMP recorded. Patient has had a hysterectomy. Contraception: status post hysterectomy Last Pap: 01-23-2020. Results were: normal Last mammogram: 06-07-2019. Results were: normal  Obstetric History OB History  Gravida Para Term Preterm AB Living  4 1 1  0 3 1  SAB IAB Ectopic Multiple Live Births  1 1 1  0 1    # Outcome Date GA Lbr Len/2nd Weight Sex Delivery Anes PTL Lv  4 Ectopic           3 IAB           2 SAB           1 Term             Past Medical History:  Diagnosis Date   Arthritis    right hip   Bronchitis last time  sept/oct 2021   Hepatitis 1980's   NON A NON B tx for 1980's resolved   Herniated disc 10/07/2011   lower back   History of chronic kidney disease 1980's   resolved   HIV (human immunodeficiency virus infection) ([place]) 1985   Hypertension    Lower GI bleed 05/10/2012   pt denies   Neuropathy    resolved   Osteoporosis    Ruptured lumbar disc    Sciatica     right side   Substance abuse ([place])    past hsitory  clean more than 20 years    TB (pulmonary tuberculosis) 1993    exposure, treated    VIN III (vulvar intraepithelial neoplasia III)     Past Surgical History:  Procedure Laterality Date   APPENDECTOMY  1980's   BREAST EXCISIONAL BIOPSY Left 2001 per pt   cyst removed    CHOLECYSTECTOMY  2694'W   CO2 LASER APPLICATION N/A 54/62/7035   Procedure: CO2 LASER APPLICATION OF VULVA (PERI-CLITORAL AND PERI-ANAL);  Surgeon: Lafonda Mosses, MD;  [facility];  Service: Gynecology;  Laterality: N/A;   ECTOPIC PREGNANCY SURGERY  1985   injections to lower back  09/2017   LAPAROSCOPY N/A 07/03/2019   Procedure: LAPAROSCOPY DIAGNOSTIC; LYSIS OF ADHESIONS;  Surgeon: Lafonda Mosses, MD;  Location: [facility];  Service: Gynecology;  Laterality: N/A;   ROBOTIC ASSISTED TOTAL HYSTERECTOMY WITH BILATERAL SALPINGO OOPHERECTOMY N/A 07/03/2019   Procedure: XI ROBOTIC ASSISTED TOTAL HYSTERECTOMY WITH UNILATERAL SALPINGO OOPHORECTOMY;  Surgeon: Lafonda Mosses, MD;  Location: [facility];  Service: Gynecology;  Laterality: N/A;   [place]  N/A 01/18/2018   Procedure: PARTIAL VULVECTOMY;  Surgeon: [name], MD;  Location: [facility];  Service: Gynecology;  Laterality: N/A;     Current Outpatient Medications:    acyclovir ointment (ZOVIRAX) 5 %, Apply 1 application topically every 3 (three) hours. (Patient taking differently: Apply 1 application topically every 3 (three) hours as needed (irritation.).), Disp: 30 g, Rfl: 0   Ascorbic Acid (VITAMIN C) 1000 MG tablet, Take 1,000 mg by mouth at bedtime., Disp: , Rfl:    Biotin 1000 MCG tablet, Take 2,000 mcg by mouth daily in the afternoon., Disp: , Rfl:    dolutegravir (TIVICAY) 50 MG tablet, TAKE 1 TABLET (50 MG TOTAL) BY MOUTH DAILY., Disp: 30 tablet, Rfl: 2   emtricitabine-tenofovir AF (DESCOVY) 200-25 MG tablet, TAKE 1  TABLET BY MOUTH DAILY., Disp: 30 tablet, Rfl: 2   fluconazole (DIFLUCAN) 150 MG tablet, Take 1 tablet (150 mg total) by mouth daily. (Patient not taking: Reported on 04/03/2021), Disp: 3 tablet, Rfl: 0   gabapentin (NEURONTIN) 300 MG capsule, TAKE 1 CAPSULE BY MOUTH THREE TIMES A DAY (Patient taking differently: Take 600 mg by mouth at bedtime.), Disp: 270 capsule, Rfl: 1   losartan (COZAAR) 50 MG tablet, Take 50 mg by mouth daily., Disp: , Rfl:    Multiple Minerals-Vitamins (CAL-MAG ZINC II PO), Take 1 tablet by mouth daily in the afternoon., Disp: , Rfl:    Omega-3 Fatty Acids (FISH OIL PO), Take 2,000 mg by mouth daily as needed (hot flashes.)., Disp: , Rfl:    senna-docusate (SENOKOT-S) 8.6-50 MG tablet, Take 2 tablets by mouth at bedtime. For AFTER surgery, do not take if having diarrhea, Disp: 30 tablet, Rfl: 0   traMADol (ULTRAM) 50 MG tablet, Take 2 tablets (100 mg total) by mouth 3 (three) times daily as needed for moderate pain., Disp: 15 tablet, Rfl: 0 Allergies  Allergen Reactions   Baclofen Other (See Comments)    rash   Bactrim [Sulfamethoxazole-Trimethoprim] Rash   Doxycycline Rash    Social History   Tobacco Use   Smoking status: Former    Packs/day: 0.50    Years: 30.00    Pack years: 15.00    Types: Cigarettes   Smokeless tobacco: Never   Tobacco comments:    quit march 2018  Substance Use Topics   Alcohol use: No    Family History  Problem Relation Age of Onset   Asthma Father    COPD Father    Cancer Mother    Diabetes Brother    Hypertension Brother    Diabetes Brother    Breast cancer Sister    Colon cancer Maternal Uncle       Review of Systems  Constitutional: negative for fatigue and weight loss Respiratory: negative for cough and wheezing Cardiovascular: negative for chest pain, fatigue and palpitations Gastrointestinal: negative for abdominal pain and change in bowel habits Musculoskeletal:negative for myalgias Neurological: negative for gait  problems and tremors Behavioral/Psych: negative for abusive relationship, depression Endocrine: negative for temperature intolerance    Genitourinary: positive for vaginal discharge and leaking of urine with cough, sneeze, etc.  negative for abnormal menstrual periods, genital lesions, hot flashes, sexual problems  Integument/breast: negative for breast lump, breast tenderness, nipple discharge and skin lesion(s)    Objective:       BP (!) 147/81    Pulse (!) 53    Ht 5\' 6"  (1.676 m)    Wt 171 lb (77.6 kg)    BMI 27.60  kg/m  General:   Alert and no distress  Skin:   no rash or abnormalities  Lungs:   clear to auscultation bilaterally  Heart:   regular rate and rhythm, S1, S2 normal, no murmur, click, rub or gallop  Breasts:   normal without suspicious masses, skin or nipple changes or axillary nodes  Abdomen:  normal findings: no organomegaly, soft, non-tender and no hernia  Pelvis:  External genitalia: normal general appearance Urinary system: urethral meatus normal and bladder without fullness, nontender Vaginal: normal without tenderness, induration or masses Cervix: absent Adnexa: absent Uterus: absent   Lab Review Urine pregnancy test Labs reviewed yes Radiologic studies reviewed yes  I have spent a total of 20 minutes of face-to-face time, excluding clinical staff time, reviewing notes and preparing to see patient, ordering tests and/or medications, and counseling the patient.   Assessment:    1. Encounter for gynecological examination  2. S/P hysterectomy with oophorectomy  3. Screening breast examination Rx: - MM Digital Screening; Future  4. Urgency incontinence Rx: - Ambulatory referral to Urology - Urine Culture  5. Vaginal discharge Rx: - Cervicovaginal ancillary only     Plan:    Education reviewed: calcium supplements, depression evaluation, low fat, low cholesterol diet, safe sex/STD prevention, self breast exams, and weight bearing  exercise. Mammogram ordered. Follow up in: 1 year.      Shelly Bombard, MD 04/03/2021 8:31 AM

## 2021-04-03 NOTE — Progress Notes (Addendum)
GYN presents for AEX.  C/o vaginal prolapse.  She had a Hysterectomy in 2020. Next Mammogram is due 06/06/2021.

## 2021-04-05 LAB — URINE CULTURE

## 2021-04-07 ENCOUNTER — Telehealth: Payer: Self-pay

## 2021-04-07 LAB — CERVICOVAGINAL ANCILLARY ONLY
Bacterial Vaginitis (gardnerella): NEGATIVE
Candida Glabrata: NEGATIVE
Candida Vaginitis: NEGATIVE
Comment: NEGATIVE
Comment: NEGATIVE
Comment: NEGATIVE
Comment: NEGATIVE
Trichomonas: NEGATIVE

## 2021-04-07 NOTE — Telephone Encounter (Signed)
Patient called and requested estrogen patch to be sent to the pharmacy.

## 2021-04-08 ENCOUNTER — Other Ambulatory Visit: Payer: Self-pay | Admitting: Obstetrics

## 2021-04-08 DIAGNOSIS — N951 Menopausal and female climacteric states: Secondary | ICD-10-CM

## 2021-04-08 MED ORDER — ESTRADIOL 0.1 MG/24HR TD PTWK: 0.1000 mg | MEDICATED_PATCH | TRANSDERMAL | 12 refills | Status: DC

## 2021-04-09 ENCOUNTER — Other Ambulatory Visit (HOSPITAL_COMMUNITY): Payer: Self-pay

## 2021-04-18 ENCOUNTER — Ambulatory Visit
Admission: RE | Admit: 2021-04-18 | Discharge: 2021-04-18 | Disposition: A | Discharge status: Home / Self Care | Payer: Medicare HMO | Source: Ambulatory Visit | Attending: Obstetrics | Admitting: Obstetrics

## 2021-04-18 DIAGNOSIS — Z1239 Encounter for other screening for malignant neoplasm of breast: Secondary | ICD-10-CM

## 2021-04-29 ENCOUNTER — Other Ambulatory Visit (HOSPITAL_COMMUNITY): Payer: Self-pay

## 2021-05-08 ENCOUNTER — Other Ambulatory Visit (HOSPITAL_COMMUNITY): Payer: Self-pay

## 2021-05-23 ENCOUNTER — Other Ambulatory Visit (HOSPITAL_COMMUNITY): Payer: Self-pay

## 2021-05-27 ENCOUNTER — Other Ambulatory Visit (HOSPITAL_COMMUNITY): Payer: Self-pay

## 2021-06-04 ENCOUNTER — Other Ambulatory Visit (HOSPITAL_COMMUNITY): Payer: Self-pay

## 2021-06-05 ENCOUNTER — Other Ambulatory Visit (HOSPITAL_COMMUNITY): Payer: Self-pay

## 2021-06-26 ENCOUNTER — Other Ambulatory Visit: Payer: Self-pay | Admitting: Internal Medicine

## 2021-06-26 ENCOUNTER — Other Ambulatory Visit (HOSPITAL_COMMUNITY): Payer: Self-pay

## 2021-06-26 DIAGNOSIS — Z21 Asymptomatic human immunodeficiency virus [HIV] infection status: Secondary | ICD-10-CM

## 2021-06-26 NOTE — Telephone Encounter (Signed)
Appt 4/25 ?

## 2021-06-30 ENCOUNTER — Other Ambulatory Visit: Payer: Self-pay | Admitting: Internal Medicine

## 2021-06-30 ENCOUNTER — Other Ambulatory Visit (HOSPITAL_COMMUNITY): Payer: Self-pay

## 2021-06-30 DIAGNOSIS — Z21 Asymptomatic human immunodeficiency virus [HIV] infection status: Secondary | ICD-10-CM

## 2021-06-30 NOTE — Telephone Encounter (Signed)
Appt 4/24 ?

## 2021-06-30 NOTE — Telephone Encounter (Signed)
Appt 07/01/21 ?

## 2021-07-01 ENCOUNTER — Ambulatory Visit: Payer: Medicare HMO | Admitting: Internal Medicine

## 2021-07-01 ENCOUNTER — Other Ambulatory Visit (HOSPITAL_COMMUNITY): Payer: Self-pay

## 2021-07-01 MED ORDER — TIVICAY 50 MG PO TABS
ORAL_TABLET | Freq: Every day | ORAL | 0 refills | Status: DC
  Filled 2021-07-01: qty 30, 30d supply, fill #0

## 2021-07-01 MED ORDER — DESCOVY 200-25 MG PO TABS
1.0000 | ORAL_TABLET | Freq: Every day | ORAL | 0 refills | Status: DC
  Filled 2021-07-01: qty 30, 30d supply, fill #0

## 2021-07-07 ENCOUNTER — Other Ambulatory Visit (HOSPITAL_COMMUNITY): Payer: Self-pay

## 2021-07-15 ENCOUNTER — Other Ambulatory Visit: Payer: Self-pay

## 2021-07-15 ENCOUNTER — Ambulatory Visit (INDEPENDENT_AMBULATORY_CARE_PROVIDER_SITE_OTHER): Payer: Medicare HMO | Admitting: Internal Medicine

## 2021-07-15 ENCOUNTER — Ambulatory Visit
Admission: RE | Admit: 2021-07-15 | Discharge: 2021-07-15 | Disposition: A | Discharge status: Home / Self Care | Payer: Medicare HMO | Source: Ambulatory Visit | Attending: Internal Medicine | Admitting: Internal Medicine

## 2021-07-15 ENCOUNTER — Other Ambulatory Visit (HOSPITAL_COMMUNITY): Payer: Self-pay

## 2021-07-15 ENCOUNTER — Encounter: Payer: Self-pay | Admitting: Internal Medicine

## 2021-07-15 VITALS — BP 121/72 | HR 51 | Temp 97.7°F | Wt 176.0 lb

## 2021-07-15 DIAGNOSIS — B20 Human immunodeficiency virus [HIV] disease: Secondary | ICD-10-CM

## 2021-07-15 DIAGNOSIS — Z21 Asymptomatic human immunodeficiency virus [HIV] infection status: Secondary | ICD-10-CM

## 2021-07-15 DIAGNOSIS — M25511 Pain in right shoulder: Secondary | ICD-10-CM | POA: Diagnosis not present

## 2021-07-15 DIAGNOSIS — Z79899 Other long term (current) drug therapy: Secondary | ICD-10-CM | POA: Diagnosis not present

## 2021-07-15 MED ORDER — DESCOVY 200-25 MG PO TABS
1.0000 | ORAL_TABLET | Freq: Every day | ORAL | 11 refills | Status: DC
  Filled 2021-07-15 – 2021-07-29 (×2): qty 30, 30d supply, fill #0
  Filled 2021-08-28 (×2): qty 30, 30d supply, fill #1
  Filled 2021-09-29: qty 30, 30d supply, fill #2
  Filled 2021-10-28: qty 30, 30d supply, fill #3
  Filled 2021-11-26: qty 30, 30d supply, fill #4
  Filled 2021-12-30: qty 30, 30d supply, fill #5
  Filled 2022-01-22: qty 30, 30d supply, fill #6
  Filled 2022-02-24: qty 30, 30d supply, fill #7

## 2021-07-15 MED ORDER — TIVICAY 50 MG PO TABS
50.0000 mg | ORAL_TABLET | Freq: Every day | ORAL | 11 refills | Status: DC
Start: 2021-07-15 — End: 2022-03-19
  Filled 2021-07-15 – 2021-07-29 (×2): qty 30, 30d supply, fill #0
  Filled 2021-08-28: qty 30, 30d supply, fill #1
  Filled 2021-09-29: qty 30, 30d supply, fill #2
  Filled 2021-10-28: qty 30, 30d supply, fill #3
  Filled 2021-11-26: qty 30, 30d supply, fill #4
  Filled 2021-12-30: qty 30, 30d supply, fill #5
  Filled 2022-01-22: qty 30, 30d supply, fill #6
  Filled 2022-02-24: qty 30, 30d supply, fill #7

## 2021-07-15 NOTE — Progress Notes (Signed)
? ?RFV: follow up for hiv disease ? ?Patient ID: Courtney Hamilton, female   DOB: 07-20-1960, 61 y.o.   MRN: [id number] ? ?HPI ?61yo F with well controlled hiv disease, CD 4 count of 656/VL<20 in dec 2022, currently on tivicay/descovy ? ?So far patch is working in helping her hot-flashes. Using some geranium oil with lotion to help arthritis ?Had hyterectomy 2021. ? ?Right shoulder pain but has full range of motion" occasionally clicks: ? ?Father moved in with her.  ? ?Outpatient Encounter Medications as of 07/15/2021  ?Medication Sig  ? acyclovir ointment (ZOVIRAX) 5 % Apply 1 application topically every 3 (three) hours. (Patient taking differently: Apply 1 application. topically every 3 (three) hours as needed (irritation.).)  ? Ascorbic Acid (VITAMIN C) 1000 MG tablet Take 1,000 mg by mouth at bedtime.  ? Biotin 1000 MCG tablet Take 2,000 mcg by mouth daily in the afternoon.  ? dolutegravir (TIVICAY) 50 MG tablet TAKE 1 TABLET (50 MG TOTAL) BY MOUTH DAILY.  ? emtricitabine-tenofovir AF (DESCOVY) 200-25 MG tablet TAKE 1 TABLET BY MOUTH DAILY.  ? estradiol (CLIMARA) 0.1 mg/24hr patch Place 1 patch (0.1 mg total) onto the skin once a week.  ? gabapentin (NEURONTIN) 300 MG capsule TAKE 1 CAPSULE BY MOUTH THREE TIMES A DAY (Patient taking differently: Take 600 mg by mouth at bedtime.)  ? losartan (COZAAR) 50 MG tablet Take 50 mg by mouth daily.  ? Multiple Minerals-Vitamins (CAL-MAG ZINC II PO) Take 1 tablet by mouth daily in the afternoon.  ? Omega-3 Fatty Acids (FISH OIL PO) Take 2,000 mg by mouth daily as needed (hot flashes.).  ? traMADol (ULTRAM) 50 MG tablet Take 2 tablets (100 mg total) by mouth 3 (three) times daily as needed for moderate pain.  ? fluconazole (DIFLUCAN) 150 MG tablet Take 1 tablet (150 mg total) by mouth daily. (Patient not taking: Reported on 04/03/2021)  ? senna-docusate (SENOKOT-S) 8.6-50 MG tablet Take 2 tablets by mouth at bedtime. For AFTER surgery, do not take if having diarrhea  ? ?No  facility-administered encounter medications on file as of 07/15/2021.  ?  ? ?Patient Active Problem List  ? Diagnosis Date Noted  ? 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 Thickened endometrium   ? Cervical stenosis (uterine cervix)   ? Vulvar intraepithelial neoplasia (VIN) grade 3   ? Essential hypertension 08/05/2017  ? Fungal infection of skin of abdomen 06/16/2017  ? Flu-like symptoms 05/25/2016  ? Head injury 11/05/2015  ? Insomnia 11/05/2015  ? Former cigarette smoker 11/05/2015  ? Chronic low back pain without sciatica 02/18/2015  ? Fall 01/14/2015  ? Lower abdominal pain 10/08/2014  ? Generalized anxiety disorder 06/04/2014  ? Anxiety state 01/07/2014  ? Panic attacks 12/22/2013  ? Lumbosacral spondylosis without myelopathy 09/29/2013  ? 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 Lumbar paraspinal muscle spasm 01/16/2013  ? Reflux 12/13/2011  ? Lower leg edema 12/10/2011  ? Herpes simplex 10/07/2011  ? Peripheral neuropathy 10/07/2011  ? Hypercholesteremia 10/07/2011  ? History of tuberculosis exposure 10/01/2011  ? Recurrent UTI 08/06/2011  ? HIV positive ([place]) 08/03/2011  ? Sleep disorder 08/03/2011  ? Back pain 07/21/2011  ? Osteoporosis 03/09/2008  ? ? ? ?Health Maintenance Due  ?Topic Date Due  ? Zoster Vaccines- Shingrix (1 of 2) Never done  ? TETANUS/TDAP  05/22/2017  ? COVID-19 Vaccine (3 - Pfizer risk series) 07/18/2019  ?  ? ?Review of Systems ?Review of Systems  ?Constitutional: Negative for fever, chills,  diaphoresis, activity change, appetite change, fatigue and unexpected weight change.  ?HENT: Negative for congestion, sore throat, rhinorrhea, sneezing, trouble swallowing and sinus pressure.  ?Eyes: Negative for photophobia and visual disturbance.  ?Respiratory: Negative for cough, chest tightness, shortness of breath, wheezing and stridor.  ?Cardiovascular: Negative for chest pain, palpitations and leg swelling.  ?Gastrointestinal: Negative for nausea, vomiting, abdominal pain,  diarrhea, constipation, blood in stool, abdominal distention and anal bleeding.  ?Genitourinary: Negative for dysuria, hematuria, flank pain and difficulty urinating.  ?Musculoskeletal: Negative for myalgias, back pain, joint swelling, arthralgias and gait problem.  ?Skin: Negative for color change, pallor, rash and wound.  ?Neurological: Negative for dizziness, tremors, weakness and light-headedness.  ?Hematological: Negative for adenopathy. Does not bruise/bleed easily.  ?Psychiatric/Behavioral: Negative for behavioral problems, confusion, sleep disturbance, dysphoric mood, decreased concentration and agitation.  ? ?Physical Exam  ? ?BP 121/72   Pulse (!) 51   Temp 97.7 ?F (36.5 ?C) (Oral)   Wt 176 lb (79.8 kg)   BMI 28.41 kg/m?   ?Physical Exam  ?Constitutional:  oriented to person, place, and time. appears well-developed and well-nourished. No distress.  ?HENT: [place]/AT, PERRLA, no scleral icterus ?Mouth/Throat: Oropharynx is clear and moist. No oropharyngeal exudate.  ?Cardiovascular: Normal rate, regular rhythm and normal heart sounds. Exam reveals no gallop and no friction rub.  ?No murmur heard.  ?Pulmonary/Chest: Effort normal and breath sounds normal. No respiratory distress.  has no wheezes.  ?Neck = supple, no nuchal rigidity ?Lymphadenopathy: no cervical adenopathy. No axillary adenopathy ?Neurological: alert and oriented to person, place, and time.  ?Skin: Skin is warm and dry. No rash noted. No erythema.  ?Psychiatric: a normal mood and affect.  behavior is normal.  ? ?Lab Results  ?Component Value Date  ? CD4TCELL 19 (L) 02/18/2021  ? ?Lab Results  ?Component Value Date  ? CD4TABS 656 02/18/2021  ? CD4TABS 518 08/08/2020  ? CD4TABS 369 (L) 11/20/2019  ? ?Lab Results  ?Component Value Date  ? HIV1RNAQUANT Not Detected 02/18/2021  ? ?Lab Results  ?Component Value Date  ? HEPBSAB REACTIVE (A) 05/18/2017  ? ?Lab Results  ?Component Value Date  ? LABRPR NON-REACTIVE 08/08/2020  ? ? ?CBC ?Lab Results   ?Component Value Date  ? WBC 9.4 02/18/2021  ? RBC 4.28 02/18/2021  ? HGB 12.0 02/18/2021  ? HCT 37.2 02/18/2021  ? PLT 499 (H) 02/18/2021  ? MCV 86.9 02/18/2021  ? MCH 28.0 02/18/2021  ? MCHC 32.3 02/18/2021  ? RDW 11.8 02/18/2021  ? LYMPHSABS 3,299 02/18/2021  ? MONOABS 2.1 (H) 02/08/2021  ? EOSABS 235 02/18/2021  ? ? ?BMET ?Lab Results  ?Component Value Date  ? NA 139 02/18/2021  ? K 4.4 02/18/2021  ? CL 102 02/18/2021  ? CO2 28 02/18/2021  ? GLUCOSE 75 02/18/2021  ? BUN 9 02/18/2021  ? CREATININE 0.86 02/18/2021  ? CALCIUM 9.4 02/18/2021  ? GFRNONAA 44 (L) 02/08/2021  ? GFRAA 70 08/08/2020  ? ? ? ? ?Assessment and Plan ?Hiv disease= will do labs to see that she is still undetectable and provide refill ? ?Long term medication management = will check cr ? ?Right shoulder pain = will get palin xray but also will try to do a 30 day trial of mobic- she has some at home on a full stomach. And voltaren cream ? ?Health maintenance = needs bivalent booster  ? ?

## 2021-07-16 LAB — T-HELPER CELL (CD4) - (RCID CLINIC ONLY)
CD4 % Helper T Cell: 19 % — ABNORMAL LOW (ref 33–65)
CD4 T Cell Abs: 571 /uL (ref 400–1790)

## 2021-07-18 LAB — RPR: RPR Ser Ql: NONREACTIVE

## 2021-07-18 LAB — CBC WITH DIFFERENTIAL/PLATELET
Absolute Monocytes: 653 cells/uL (ref 200–950)
Basophils Absolute: 64 cells/uL (ref 0–200)
Basophils Relative: 0.9 %
Eosinophils Absolute: 426 cells/uL (ref 15–500)
Eosinophils Relative: 6 %
HCT: 42.7 % (ref 35.0–45.0)
Hemoglobin: 13.8 g/dL (ref 11.7–15.5)
Lymphs Abs: 3536 cells/uL (ref 850–3900)
MCH: 28.4 pg (ref 27.0–33.0)
MCHC: 32.3 g/dL (ref 32.0–36.0)
MCV: 87.9 fL (ref 80.0–100.0)
MPV: 10.1 fL (ref 7.5–12.5)
Monocytes Relative: 9.2 %
Neutro Abs: 2421 cells/uL (ref 1500–7800)
Neutrophils Relative %: 34.1 %
Platelets: 247 10*3/uL (ref 140–400)
RBC: 4.86 10*6/uL (ref 3.80–5.10)
RDW: 12.7 % (ref 11.0–15.0)
Total Lymphocyte: 49.8 %
WBC: 7.1 10*3/uL (ref 3.8–10.8)

## 2021-07-18 LAB — COMPLETE METABOLIC PANEL WITH GFR
AG Ratio: 1.5 (calc) (ref 1.0–2.5)
ALT: 8 U/L (ref 6–29)
AST: 15 U/L (ref 10–35)
Albumin: 4.3 g/dL (ref 3.6–5.1)
Alkaline phosphatase (APISO): 62 U/L (ref 37–153)
BUN: 7 mg/dL (ref 7–25)
CO2: 24 mmol/L (ref 20–32)
Calcium: 9.5 mg/dL (ref 8.6–10.4)
Chloride: 104 mmol/L (ref 98–110)
Creat: 0.96 mg/dL (ref 0.50–1.05)
Globulin: 2.8 g/dL (calc) (ref 1.9–3.7)
Glucose, Bld: 87 mg/dL (ref 65–99)
Potassium: 3.9 mmol/L (ref 3.5–5.3)
Sodium: 137 mmol/L (ref 135–146)
Total Bilirubin: 0.5 mg/dL (ref 0.2–1.2)
Total Protein: 7.1 g/dL (ref 6.1–8.1)
eGFR: 67 mL/min/{1.73_m2} (ref >=60)

## 2021-07-18 LAB — HIV-1 RNA QUANT-NO REFLEX-BLD
HIV 1 RNA Quant: NOT DETECTED copies/mL
HIV-1 RNA Quant, Log: NOT DETECTED Log copies/mL

## 2021-07-29 ENCOUNTER — Other Ambulatory Visit (HOSPITAL_COMMUNITY): Payer: Self-pay

## 2021-08-06 ENCOUNTER — Other Ambulatory Visit (HOSPITAL_COMMUNITY): Payer: Self-pay

## 2021-08-28 ENCOUNTER — Other Ambulatory Visit (HOSPITAL_COMMUNITY): Payer: Self-pay

## 2021-09-03 ENCOUNTER — Other Ambulatory Visit (HOSPITAL_COMMUNITY): Payer: Self-pay

## 2021-09-29 ENCOUNTER — Other Ambulatory Visit (HOSPITAL_COMMUNITY): Payer: Self-pay

## 2021-10-02 ENCOUNTER — Other Ambulatory Visit (HOSPITAL_COMMUNITY): Payer: Self-pay

## 2021-10-28 ENCOUNTER — Other Ambulatory Visit (HOSPITAL_COMMUNITY): Payer: Self-pay

## 2021-10-29 ENCOUNTER — Other Ambulatory Visit (HOSPITAL_COMMUNITY): Payer: Self-pay

## 2021-11-26 ENCOUNTER — Other Ambulatory Visit (HOSPITAL_COMMUNITY): Payer: Self-pay

## 2021-12-02 ENCOUNTER — Other Ambulatory Visit (HOSPITAL_COMMUNITY): Payer: Self-pay

## 2021-12-05 ENCOUNTER — Other Ambulatory Visit (HOSPITAL_COMMUNITY): Payer: Self-pay

## 2021-12-30 ENCOUNTER — Other Ambulatory Visit (HOSPITAL_COMMUNITY): Payer: Self-pay

## 2022-01-05 ENCOUNTER — Other Ambulatory Visit (HOSPITAL_COMMUNITY): Payer: Self-pay

## 2022-01-22 ENCOUNTER — Other Ambulatory Visit (HOSPITAL_COMMUNITY): Payer: Self-pay

## 2022-02-03 ENCOUNTER — Other Ambulatory Visit (HOSPITAL_COMMUNITY): Payer: Self-pay

## 2022-02-24 ENCOUNTER — Other Ambulatory Visit (HOSPITAL_COMMUNITY): Payer: Self-pay

## 2022-02-24 ENCOUNTER — Other Ambulatory Visit: Payer: Self-pay

## 2022-03-03 ENCOUNTER — Other Ambulatory Visit (HOSPITAL_COMMUNITY): Payer: Self-pay

## 2022-03-10 ENCOUNTER — Ambulatory Visit: Payer: Medicare HMO | Admitting: Internal Medicine

## 2022-03-13 ENCOUNTER — Encounter: Payer: Self-pay | Admitting: Family Medicine

## 2022-03-13 ENCOUNTER — Ambulatory Visit (INDEPENDENT_AMBULATORY_CARE_PROVIDER_SITE_OTHER): Payer: Medicare Other | Admitting: Family Medicine

## 2022-03-13 VITALS — BP 136/84 | HR 62 | Wt 173.4 lb

## 2022-03-13 DIAGNOSIS — M5441 Lumbago with sciatica, right side: Secondary | ICD-10-CM | POA: Diagnosis not present

## 2022-03-13 DIAGNOSIS — B20 Human immunodeficiency virus [HIV] disease: Secondary | ICD-10-CM

## 2022-03-13 DIAGNOSIS — B009 Herpesviral infection, unspecified: Secondary | ICD-10-CM

## 2022-03-13 DIAGNOSIS — I10 Essential (primary) hypertension: Secondary | ICD-10-CM

## 2022-03-13 DIAGNOSIS — G8929 Other chronic pain: Secondary | ICD-10-CM | POA: Diagnosis not present

## 2022-03-13 DIAGNOSIS — Z0000 Encounter for general adult medical examination without abnormal findings: Secondary | ICD-10-CM

## 2022-03-13 DIAGNOSIS — Z21 Asymptomatic human immunodeficiency virus [HIV] infection status: Secondary | ICD-10-CM

## 2022-03-13 MED ORDER — ACYCLOVIR 5 % EX OINT: 1.0000 | TOPICAL_OINTMENT | CUTANEOUS | 0 refills | Status: DC

## 2022-03-13 MED ORDER — TRAMADOL HCL 50 MG PO TABS: 50.0000 mg | ORAL_TABLET | Freq: Two times a day (BID) | ORAL | 2 refills | Status: DC | PRN

## 2022-03-13 NOTE — Patient Instructions (Addendum)
It was great to see you! Thank you for allowing me to participate in your care!  I recommend that you always bring your medications to each appointment as this makes it easy to ensure we are on the correct medications and helps [place] not miss when refills are needed.  Our plans for today:  - It was a pleasure to meet you today. - I have sent a referral for physical therapy. They should call to schedule you. - Please follow-up in 1 month.   Take care and seek immediate care sooner if you develop any concerns.   Dr. Salvadore Oxford, MD [place]

## 2022-03-13 NOTE — Progress Notes (Unsigned)
Patient name: Courtney Hamilton Medical record number: [id number] Date of Birth: July 06, 1960 Age: 62 y.o. Gender: female  Courtney Hamilton is a 62 y.o. female here to establish care.   Concerns today: Chronic pain  Patient states that her chronic hip/lumbar back pain has been worsening over the past several months.  She now has pain daily in the mornings.  States that she overworked herself over the past year and is trying to take it easier this year.  Pain is burning on the outside of her right hip primarily and shoots down to her knee.  Pain is better with tramadol that she takes.  Social history: Prior PCP: Milford Cage, PA Last went to doctor: last month Occupation: disabled Lives with: lives with father, she is primary caretaker Smoking/Vaping:  5 years ago quit, pack a day 40 years Alcohol: quit 30 years ago, prior to that time, socially Marijuana/ilicit substances:  occasionally marijuana, once a week, smoking   Allergies: Reviewed in allergy tab, no new allergies. Daily medications: vitamin d Medical Problems:   -HTN  -Osteoporosis  -Chronic low back pain from sciatica and osteoarthritis  -Cervical carcinoma in situ s/p hysterectomy  -GAD  -Chronic Lumbar pain  -HIV - sees infectious disease, has appointment next week  -Chronic bronchitis - patient endorses, no history that I can see in chart review -High cholesterol - States she does not have high cholesterol that she is aware of.  Surgical hx: Hysterectomy, Gallbladder and appendix removed, Hx of ruptured ectopic pregnancy, blood transfusion which resulted in HIV  Family hx:  diabetes   Health Maintenance:  Vaccines: states she got pneumococcal, and got flu vaccine this year, has not gotten covid  Objective:  BP 136/84   Pulse 62   Wt 173 lb 6 oz (78.6 kg)   SpO2 100%   BMI 27.98 kg/m  Physical Exam Constitutional:      General: She is not in acute distress.    Appearance: Normal appearance. She is not  toxic-appearing.  HENT:     Head: Normocephalic and atraumatic.     Right Ear: External ear normal.     Left Ear: External ear normal.     Nose: Nose normal.  Eyes:     General: No scleral icterus.    Conjunctiva/sclera: Conjunctivae normal.  Cardiovascular:     Rate and Rhythm: Normal rate and regular rhythm.     Pulses: Normal pulses.     Heart sounds: Normal heart sounds.  Pulmonary:     Effort: Pulmonary effort is normal.     Breath sounds: Normal breath sounds.  Abdominal:     Palpations: Abdomen is soft.  Musculoskeletal:        General: No swelling.  Skin:    Coloration: Skin is not jaundiced.     Findings: No erythema.  Neurological:     General: No focal deficit present.     Mental Status: She is alert.  Psychiatric:        Mood and Affect: Mood normal.    Assessment & Plan:  Chronic low back pain with sciatica Sciatic hip worsening over past couple months. Patient would like to try physical therapy for hip strengthening first as there is also a component of osteoarthritis. -Physical Therapy referral for hip strengthening -Refill tramadol, decrease prescription to what she is actually requiring 50 mg twice daily as needed -Consider increasing gabapentin for neuropathic pain and sciatica  Healthcare maintenance -Discussed with patient that we will further discuss vaccinations she  needs at follow-up visit, reports getting flu vaccine already  HIV (human immunodeficiency virus infection) ([place]) Follows with Dr. Carlyle Basques, has follow-up appointment on 03/19/22.  -HIV 1 RNA Quant Not dected 07/15/21 -CD4 T cell Abs 571 07/15/21 -CD4 % Helper T cell 19 (low) 07/15/21 -Continue TIVICAY and DESCOVY   Essential hypertension BP controlled today at 136/84. Will discuss further optimization at next visit. -Follow-up 1 month -Continue Losartan '50mg'$  daily    Salvadore Oxford, MD, PGY-1 [facility] 10:17 AM 03/14/2022

## 2022-03-14 ENCOUNTER — Encounter: Payer: Self-pay | Admitting: Family Medicine

## 2022-03-14 DIAGNOSIS — Z0000 Encounter for general adult medical examination without abnormal findings: Secondary | ICD-10-CM | POA: Insufficient documentation

## 2022-03-14 NOTE — Assessment & Plan Note (Addendum)
Follows with Dr. Carlyle Basques, has follow-up appointment on 03/19/22.  -HIV 1 RNA Quant Not dected 07/15/21 -CD4 T cell Abs 571 07/15/21 -CD4 % Helper T cell 19 (low) 07/15/21 -Continue TIVICAY and DESCOVY

## 2022-03-14 NOTE — Assessment & Plan Note (Addendum)
Sciatic hip worsening over past couple months. Patient would like to try physical therapy for hip strengthening first as there is also a component of osteoarthritis. -Physical Therapy referral for hip strengthening -Refill tramadol, decrease prescription to what she is actually requiring 50 mg twice daily as needed -Consider increasing gabapentin for neuropathic pain and sciatica

## 2022-03-14 NOTE — Assessment & Plan Note (Signed)
BP controlled today at 136/84. Will discuss further optimization at next visit. -Follow-up 1 month -Continue Losartan '50mg'$  daily

## 2022-03-14 NOTE — Assessment & Plan Note (Signed)
-  Discussed with patient that we will further discuss vaccinations she needs at follow-up visit, reports getting flu vaccine already

## 2022-03-19 ENCOUNTER — Other Ambulatory Visit: Payer: Self-pay

## 2022-03-19 ENCOUNTER — Encounter: Payer: Self-pay | Admitting: Internal Medicine

## 2022-03-19 ENCOUNTER — Ambulatory Visit (INDEPENDENT_AMBULATORY_CARE_PROVIDER_SITE_OTHER): Payer: 59 | Admitting: Internal Medicine

## 2022-03-19 VITALS — BP 113/74 | HR 65 | Resp 16 | Ht 66.0 in | Wt 174.0 lb

## 2022-03-19 DIAGNOSIS — B20 Human immunodeficiency virus [HIV] disease: Secondary | ICD-10-CM | POA: Diagnosis not present

## 2022-03-19 DIAGNOSIS — Z0000 Encounter for general adult medical examination without abnormal findings: Secondary | ICD-10-CM | POA: Diagnosis not present

## 2022-03-19 DIAGNOSIS — E7800 Pure hypercholesterolemia, unspecified: Secondary | ICD-10-CM

## 2022-03-19 DIAGNOSIS — Z21 Asymptomatic human immunodeficiency virus [HIV] infection status: Secondary | ICD-10-CM | POA: Diagnosis not present

## 2022-03-19 DIAGNOSIS — Z79899 Other long term (current) drug therapy: Secondary | ICD-10-CM | POA: Diagnosis not present

## 2022-03-19 MED ORDER — ROSUVASTATIN CALCIUM 10 MG PO TABS: 10.0000 mg | ORAL_TABLET | Freq: Every day | ORAL | 11 refills | Status: DC

## 2022-03-19 MED ORDER — TIVICAY 50 MG PO TABS: 50.0000 mg | ORAL_TABLET | Freq: Every day | ORAL | 11 refills | Status: DC

## 2022-03-19 MED ORDER — DESCOVY 200-25 MG PO TABS: 1.0000 | ORAL_TABLET | Freq: Every day | ORAL | 11 refills | Status: DC

## 2022-03-19 MED ORDER — DICLOFENAC SODIUM 1 % EX GEL: 2.0000 g | Freq: Four times a day (QID) | CUTANEOUS | 1 refills | Status: DC

## 2022-03-19 NOTE — Progress Notes (Signed)
RFV: follow up for hiv disease  Patient ID: Courtney Hamilton, female   DOB: 10/04/1960, 62 y.o.   MRN: [id number]  HPI Courtney Hamilton is a 62yo F with hiv disease, CD 4 count of 571/VL<20 in may 2023 on tivicay/descovy; doing well with her medications. Arthritis at her baseline but still needs tramadol. Has some fatigue from caring for her father. Cares for her grandkids in the afternoon.  Using voltaren gel to neck to help   Going to physical therapy for right hip.  Outpatient Encounter Medications as of 03/19/2022  Medication Sig   acyclovir ointment (ZOVIRAX) 5 % Apply 1 Application topically every 3 (three) hours.   Ascorbic Acid (VITAMIN C) 1000 MG tablet Take 1,000 mg by mouth at bedtime.   Biotin 1000 MCG tablet Take 2,000 mcg by mouth daily in the afternoon.   dolutegravir (TIVICAY) 50 MG tablet Take 1 tablet (50 mg total) by mouth daily.   emtricitabine-tenofovir AF (DESCOVY) 200-25 MG tablet Take 1 tablet by mouth daily.   estradiol (CLIMARA) 0.1 mg/24hr patch Place 1 patch (0.1 mg total) onto the skin once a week.   fluconazole (DIFLUCAN) 150 MG tablet Take 1 tablet (150 mg total) by mouth daily.   gabapentin (NEURONTIN) 300 MG capsule TAKE 1 CAPSULE BY MOUTH THREE TIMES A DAY (Patient taking differently: Take 300 mg by mouth at bedtime.)   losartan (COZAAR) 50 MG tablet Take 50 mg by mouth daily.   traMADol (ULTRAM) 50 MG tablet Take 1 tablet (50 mg total) by mouth 2 (two) times daily as needed for moderate pain.   No facility-administered encounter medications on file as of 03/19/2022.     Patient Active Problem List   Diagnosis Date Noted   Healthcare maintenance 03/14/2022   Carcinoma in situ of cervix 07/11/2019   Thickened endometrium    Cervical stenosis (uterine cervix)    Vulvar intraepithelial neoplasia (VIN) grade 3    Essential hypertension 08/05/2017   Head injury 11/05/2015   Insomnia 11/05/2015   Former cigarette smoker 11/05/2015   Lumbosacral spondylosis  without myelopathy 09/29/2013   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   Herpes simplex 10/07/2011   Peripheral neuropathy 10/07/2011   Hypercholesteremia 10/07/2011   History of tuberculosis exposure 10/01/2011   HIV (human immunodeficiency virus infection) ([place]) 08/03/2011   Sleep disorder 08/03/2011   Chronic low back pain with sciatica 07/21/2011   Osteoporosis 03/09/2008     Health Maintenance Due  Topic Date Due   Zoster Vaccines- Shingrix (1 of 2) Never done   DTaP/Tdap/Td (2 - Td or Tdap) 05/22/2017   Medicare Annual Wellness (AWV)  04/23/2019     Review of Systems 12 point ros is otherwise negative Physical Exam   Ht '5\' 6"'$  (1.676 m)   Wt 174 lb (78.9 kg)   BMI 28.08 kg/m   Physical Exam  Constitutional:  oriented to person, place, and time. appears well-developed and well-nourished. No distress.  HENT: [place]/AT, PERRLA, no scleral icterus Mouth/Throat: Oropharynx is clear and moist. No oropharyngeal exudate.  Cardiovascular: Normal rate, regular rhythm and normal heart sounds. Exam reveals no gallop and no friction rub.  No murmur heard.  Pulmonary/Chest: Effort normal and breath sounds normal. No respiratory distress.  has no wheezes.  Lymphadenopathy: no cervical adenopathy. No axillary adenopathy Neurological: alert and oriented to person, place, and time.  Skin: Skin is warm and dry. No rash noted. No erythema.  Psychiatric: a normal mood and affect.  behavior is normal.  Lab Results  Component Value Date   CD4TCELL 19 (L) 07/15/2021   Lab Results  Component Value Date   CD4TABS 571 07/15/2021   CD4TABS 656 02/18/2021   CD4TABS 518 08/08/2020   Lab Results  Component Value Date   HIV1RNAQUANT NOT DETECTED 07/15/2021   Lab Results  Component Value Date   HEPBSAB REACTIVE (A) 05/18/2017   Lab Results  Component Value Date   LABRPR NON-REACTIVE 07/15/2021    CBC Lab Results  Component Value Date   WBC 7.1 07/15/2021   RBC 4.86  07/15/2021   HGB 13.8 07/15/2021   HCT 42.7 07/15/2021   PLT 247 07/15/2021   MCV 87.9 07/15/2021   MCH 28.4 07/15/2021   MCHC 32.3 07/15/2021   RDW 12.7 07/15/2021   LYMPHSABS 3,536 07/15/2021   MONOABS 2.1 (H) 02/08/2021   EOSABS 426 07/15/2021    BMET Lab Results  Component Value Date   NA 137 07/15/2021   K 3.9 07/15/2021   CL 104 07/15/2021   CO2 24 07/15/2021   GLUCOSE 87 07/15/2021   BUN 7 07/15/2021   CREATININE 0.96 07/15/2021   CALCIUM 9.5 07/15/2021   GFRNONAA 44 (L) 02/08/2021   GFRAA 70 08/08/2020      Assessment and Plan  Hiv disease= will get labs and refill descovy and tivicay  Long term medication management = will check cr  Hx of Hypercholesterolemia = hx of remote hypercholesteremia not on meds. Will start rosuvastatin as part of reprieve vs. treatment. And check lipids today. Addendum -- she has hypercholesteremia  Health promotion = has mammogram scheduled for feb; will do dexa scan

## 2022-03-20 ENCOUNTER — Other Ambulatory Visit (HOSPITAL_COMMUNITY): Payer: Self-pay

## 2022-03-20 LAB — T-HELPER CELL (CD4) - (RCID CLINIC ONLY)
CD4 % Helper T Cell: 19 % — ABNORMAL LOW (ref 33–65)
CD4 T Cell Abs: 606 /uL (ref 400–1790)

## 2022-03-22 LAB — COMPLETE METABOLIC PANEL WITH GFR
AG Ratio: 1.6 (calc) (ref 1.0–2.5)
ALT: 10 U/L (ref 6–29)
AST: 15 U/L (ref 10–35)
Albumin: 4.5 g/dL (ref 3.6–5.1)
Alkaline phosphatase (APISO): 60 U/L (ref 37–153)
BUN: 9 mg/dL (ref 7–25)
CO2: 28 mmol/L (ref 20–32)
Calcium: 10.2 mg/dL (ref 8.6–10.4)
Chloride: 103 mmol/L (ref 98–110)
Creat: 0.93 mg/dL (ref 0.50–1.05)
Globulin: 2.9 g/dL (calc) (ref 1.9–3.7)
Glucose, Bld: 88 mg/dL (ref 65–99)
Potassium: 4.4 mmol/L (ref 3.5–5.3)
Sodium: 140 mmol/L (ref 135–146)
Total Bilirubin: 0.4 mg/dL (ref 0.2–1.2)
Total Protein: 7.4 g/dL (ref 6.1–8.1)
eGFR: 69 mL/min/{1.73_m2} (ref >=60)

## 2022-03-22 LAB — CBC WITH DIFFERENTIAL/PLATELET
Absolute Monocytes: 770 cells/uL (ref 200–950)
Basophils Absolute: 92 cells/uL (ref 0–200)
Basophils Relative: 1.2 %
Eosinophils Absolute: 593 cells/uL — ABNORMAL HIGH (ref 15–500)
Eosinophils Relative: 7.7 %
HCT: 43.9 % (ref 35.0–45.0)
Hemoglobin: 14.3 g/dL (ref 11.7–15.5)
Lymphs Abs: 3419 cells/uL (ref 850–3900)
MCH: 29.1 pg (ref 27.0–33.0)
MCHC: 32.6 g/dL (ref 32.0–36.0)
MCV: 89.4 fL (ref 80.0–100.0)
MPV: 10.9 fL (ref 7.5–12.5)
Monocytes Relative: 10 %
Neutro Abs: 2826 cells/uL (ref 1500–7800)
Neutrophils Relative %: 36.7 %
Platelets: 245 10*3/uL (ref 140–400)
RBC: 4.91 10*6/uL (ref 3.80–5.10)
RDW: 11.7 % (ref 11.0–15.0)
Total Lymphocyte: 44.4 %
WBC: 7.7 10*3/uL (ref 3.8–10.8)

## 2022-03-22 LAB — LIPID PANEL
Cholesterol: 257 mg/dL — ABNORMAL HIGH (ref <200)
HDL: 72 mg/dL (ref >=50)
LDL Cholesterol (Calc): 161 mg/dL (calc) — ABNORMAL HIGH
Non-HDL Cholesterol (Calc): 185 mg/dL (calc) — ABNORMAL HIGH (ref <130)
Total CHOL/HDL Ratio: 3.6 (calc) (ref <5.0)
Triglycerides: 122 mg/dL (ref <150)

## 2022-03-22 LAB — HIV-1 RNA QUANT-NO REFLEX-BLD
HIV 1 RNA Quant: NOT DETECTED Copies/mL
HIV-1 RNA Quant, Log: NOT DETECTED Log cps/mL

## 2022-03-22 LAB — RPR: RPR Ser Ql: NONREACTIVE

## 2022-03-23 ENCOUNTER — Other Ambulatory Visit (HOSPITAL_COMMUNITY): Payer: Self-pay

## 2022-03-24 ENCOUNTER — Other Ambulatory Visit (HOSPITAL_COMMUNITY): Payer: Self-pay

## 2022-03-25 ENCOUNTER — Other Ambulatory Visit (HOSPITAL_COMMUNITY): Payer: Self-pay

## 2022-03-25 ENCOUNTER — Other Ambulatory Visit: Payer: Self-pay | Admitting: Internal Medicine

## 2022-03-25 DIAGNOSIS — Z21 Asymptomatic human immunodeficiency virus [HIV] infection status: Secondary | ICD-10-CM

## 2022-03-25 MED ORDER — DESCOVY 200-25 MG PO TABS
1.0000 | ORAL_TABLET | Freq: Every day | ORAL | 5 refills | Status: DC
  Filled 2022-03-25 – 2022-05-04 (×4): qty 30, 30d supply, fill #0
  Filled 2022-05-26: qty 30, 30d supply, fill #1
  Filled 2022-07-01: qty 30, 30d supply, fill #2
  Filled 2022-07-27: qty 30, 30d supply, fill #3
  Filled 2022-09-04: qty 30, 30d supply, fill #4
  Filled 2022-09-29: qty 30, 30d supply, fill #5

## 2022-03-25 MED ORDER — TIVICAY 50 MG PO TABS
50.0000 mg | ORAL_TABLET | Freq: Every day | ORAL | 5 refills | Status: DC
  Filled 2022-03-25 – 2022-05-04 (×4): qty 30, 30d supply, fill #0
  Filled 2022-05-26: qty 30, 30d supply, fill #1
  Filled 2022-07-01: qty 30, 30d supply, fill #2
  Filled 2022-07-27: qty 30, 30d supply, fill #3
  Filled 2022-09-04: qty 30, 30d supply, fill #4
  Filled 2022-09-29: qty 30, 30d supply, fill #5

## 2022-03-26 ENCOUNTER — Other Ambulatory Visit: Payer: Self-pay | Admitting: Family Medicine

## 2022-03-26 ENCOUNTER — Telehealth: Payer: Self-pay

## 2022-03-26 DIAGNOSIS — G8929 Other chronic pain: Secondary | ICD-10-CM

## 2022-03-26 DIAGNOSIS — Z1231 Encounter for screening mammogram for malignant neoplasm of breast: Secondary | ICD-10-CM

## 2022-03-26 NOTE — Telephone Encounter (Signed)
Patient calls nurse line regarding concerns with gabapentin prescription. She states that she discussed with PCP having new prescription for gabapentin 300 mg BID being sent to pharmacy. She has not yet received this.   If appropriate, please send new [facility] to [facility] on [address].   Talbot Grumbling, RN

## 2022-03-27 MED ORDER — GABAPENTIN 300 MG PO CAPS: 300.0000 mg | ORAL_CAPSULE | Freq: Two times a day (BID) | ORAL | 3 refills | Status: DC

## 2022-03-30 ENCOUNTER — Ambulatory Visit
Admission: RE | Admit: 2022-03-30 | Discharge: 2022-03-30 | Disposition: A | Discharge status: Home / Self Care | Payer: 59 | Source: Ambulatory Visit | Attending: Family Medicine | Admitting: Family Medicine

## 2022-03-30 ENCOUNTER — Ambulatory Visit (INDEPENDENT_AMBULATORY_CARE_PROVIDER_SITE_OTHER): Payer: 59 | Admitting: Student

## 2022-03-30 VITALS — BP 123/84 | HR 56 | Temp 98.1°F | Wt 177.1 lb

## 2022-03-30 DIAGNOSIS — R058 Other specified cough: Secondary | ICD-10-CM

## 2022-03-30 DIAGNOSIS — R059 Cough, unspecified: Secondary | ICD-10-CM | POA: Diagnosis not present

## 2022-03-30 DIAGNOSIS — J449 Chronic obstructive pulmonary disease, unspecified: Secondary | ICD-10-CM | POA: Diagnosis not present

## 2022-03-30 MED ORDER — AZITHROMYCIN 250 MG PO TABS: ORAL_TABLET | ORAL | 0 refills | Status: AC

## 2022-03-30 MED ORDER — ALBUTEROL SULFATE HFA 108 (90 BASE) MCG/ACT IN AERS: 2.0000 | INHALATION_SPRAY | Freq: Four times a day (QID) | RESPIRATORY_TRACT | 2 refills | Status: DC | PRN

## 2022-03-30 MED ORDER — FLUTICASONE PROPIONATE 50 MCG/ACT NA SUSP: 2.0000 | Freq: Every day | NASAL | 6 refills | Status: DC

## 2022-03-30 MED ORDER — PREDNISONE 20 MG PO TABS: 40.0000 mg | ORAL_TABLET | Freq: Every day | ORAL | 0 refills | Status: AC

## 2022-03-30 NOTE — Patient Instructions (Addendum)
It was great to see you! Thank you for allowing me to participate in your care!   Our plans for today:  - I am sending in steroids (for 5 days) and an antibiotic (for 3 days), if there is a pneumonia/other issue on chest x ray I may change this treatment and call you - I am refilling albuterol - Return to care if shortness of breath, fevers , not improving  Take care and seek immediate care sooner if you develop any concerns.  Gerrit Heck, MD

## 2022-03-30 NOTE — Assessment & Plan Note (Signed)
>>  ASSESSMENT AND PLAN FOR PRODUCTIVE COUGH WRITTEN ON 03/30/2022 12:13 PM BY Levin Erp, MD  Patient has been having cough since the beginning of January.  She has a history of smoking for a long time.  No formal PFTs in system however has been having productive sputum and also has wheezing on examination.  Believe this could be possible COPD.  I will treat her as a COPD exacerbation with steroids and azithromycin but will also obtain a chest x-ray to rule out a pneumonia/bronchitis. -Prednisone 40 mg daily for 5 days -Azithromycin 500 then 250 for 3 days -will consider changing this antibiotic pending chest x-ray -Albuterol inhaler refilled -Flonase  -Chest x-ray -Recommend PFTs once patient is improved from this illness -ED/Return precautions discussed with patient

## 2022-03-30 NOTE — Assessment & Plan Note (Signed)
Patient has been having cough since the beginning of January.  She has a history of smoking for a long time.  No formal PFTs in system however has been having productive sputum and also has wheezing on examination.  Believe this could be possible COPD.  I will treat her as a COPD exacerbation with steroids and azithromycin but will also obtain a chest x-ray to rule out a pneumonia/bronchitis. -Prednisone 40 mg daily for 5 days -Azithromycin 500 then 250 for 3 days -will consider changing this antibiotic pending chest x-ray -Albuterol inhaler refilled -Flonase  -Chest x-ray -Recommend PFTs once patient is improved from this illness -ED/Return precautions discussed with patient

## 2022-03-30 NOTE — Progress Notes (Signed)
    SUBJECTIVE:   CHIEF COMPLAINT / HPI:   Patient says that since the beginning of January she has felt sick with congestion, wheezing and productive cough. This past week she has been coughing a lot.  She has been having a productive cough with whitish sputum. Has had bronchitis before and says that last year she also had pneumonia. Using an albuterol inhaler at home which is helping. Mucinex at home as well. No fevers. No  vomiting or diarrhea. Porgressively getting worse. Coughing very hard. Has stopped smoking cigarettes 6 years ago but previously 1 ppd for 30 years.   PERTINENT  PMH / PSH: HIV on [place]-last CD4 count 606  OBJECTIVE:   BP 123/84   Pulse (!) 56   Temp 98.1 F (36.7 C) (Oral)   Wt 177 lb 2 oz (80.3 kg)   SpO2 100%   BMI 28.59 kg/m   General: Nontoxic NAD, awake, alert, responsive to questions Head: Normocephalic atraumatic, severe nasal congestion, nontender sinuses, oropharynx without exudates CV: Regular rate and rhythm no murmurs rubs or gallops Respiratory: expiratory wheezes throughout posterior lung fields, chest rises symmetrically,  no increased work of breathing Abdomen: Soft, non-tender, non-distended Extremities: Moves upper and lower extremities freely  ASSESSMENT/PLAN:   Productive cough Patient has been having cough since the beginning of January.  She has a history of smoking for a long time.  No formal PFTs in system however has been having productive sputum and also has wheezing on examination.  Believe this could be possible COPD.  I will treat her as a COPD exacerbation with steroids and azithromycin but will also obtain a chest x-ray to rule out a pneumonia/bronchitis. -Prednisone 40 mg daily for 5 days -Azithromycin 500 then 250 for 3 days -will consider changing this antibiotic pending chest x-ray -Albuterol inhaler refilled -Flonase  -Chest x-ray -Recommend PFTs once patient is improved from this illness -ED/Return precautions  discussed with patient   Gerrit Heck, MD [place]

## 2022-04-09 ENCOUNTER — Other Ambulatory Visit (HOSPITAL_COMMUNITY): Payer: Self-pay

## 2022-04-20 ENCOUNTER — Other Ambulatory Visit: Payer: Self-pay | Admitting: Obstetrics

## 2022-04-20 DIAGNOSIS — N951 Menopausal and female climacteric states: Secondary | ICD-10-CM

## 2022-04-20 NOTE — Telephone Encounter (Signed)
Patient needs annual exam for further refills.

## 2022-04-20 NOTE — Progress Notes (Unsigned)
    SUBJECTIVE:   CHIEF COMPLAINT / HPI: BP follow-up  Upset about weight is up. Tries to eat well and exercise on Zoomba. States snacking at night on popcorn is a problem. Feels that her weight is uncomfortable. Limited by pain. Feels that weight is limiting her movement. Tries to eat healthy with oatmeal, kale and spinach. Has not tried any official diets.    PFTs - Steroids and Azithromycin help with cough and congestion a couple weeks ago. States she would be okay with doing PFTs with Dr. Valentina Lucks. Would like albuterol nebulizer vials refilled.  Chronic back pain - Reports she has been increased pain since reducing her tramadol dose at last visit. She has had more pain with exercise and in general with her daily activities. She would like to increase some.   Healthcare Maintenance - Patient states that she had pap smear done at [facility] last year, that they did not tell her the results of.   PERTINENT  PMH / PSH: HTN, HIV, VIN3  OBJECTIVE:   BP 126/89   Pulse 61   Ht '5\' 6"'$  (1.676 m)   Wt 83.2 kg   SpO2 100%   BMI 29.62 kg/m   General: NAD, crying about weight gain Neuro: A&O Respiratory: normal WOB on RA Abdomen: striae on abdomen, abdominal surgical scars Extremities: Moving all 4 extremities equally  Filed Weights   04/21/22 1332  Weight: 83.2 kg    CXR - Flattened diaphragm, Hyperinflaiton.  ASSESSMENT/PLAN:   Overweight (BMI 25.0-29.9) Assessment & Plan: Patient upset about 10lb increase in weight over past month. Would like to start make changes for weight loss. Created SMART goal together to reduce popcorn snacking at night, but replacing half with grapes and then moving to popcorn. Also provided food diary which patient will bring at follow-up visit.   Chronic bilateral low back pain with right-sided sciatica Assessment & Plan: Reports that decrease in Tramadol from '100mg'$  TID to '50mg'$  twice a day was too much of a decrease from last visit and is limiting her  functionality. Would like to to move to TID. Prescription sent for '50mg'$  TID as needed for chronic pain.  Orders: -     traMADol HCl; Take 1 tablet (50 mg total) by mouth 3 (three) times daily as needed for moderate pain.  Dispense: 90 tablet; Refill: 0  Productive cough Assessment & Plan: Last visit patient likely had COPD exacerbation with Dr. Jinny Sanders. CXR consistent with hyperinflation, and is likely given patient's smoking history. Will refer to Dr. Valentina Lucks for pulmonary function testing. Pending results consider initiation of controller therapy.  Orders: -     Albuterol Sulfate HFA; Inhale 2 puffs into the lungs every 6 (six) hours as needed for wheezing or shortness of breath.  Dispense: 8 g; Refill: 2  Vulvar intraepithelial neoplasia (VIN) grade 3 Assessment & Plan: Patient states she had pap smear done in 2023 at [facility], I do not see record of that in last note from [facility]. Per chart review from Dr. Radford Pax in 2022 patient requires lifetimes pap surveillance for VIN3 despite previous hysterectomy. Plan to discuss further with patient at next visit.      Return in about 2 weeks (around 05/05/2022) for Schedule with Dr. Valentina Lucks for PFTs.  Salvadore Oxford, MD [place]

## 2022-04-21 ENCOUNTER — Encounter: Payer: Self-pay | Admitting: Family Medicine

## 2022-04-21 ENCOUNTER — Ambulatory Visit (INDEPENDENT_AMBULATORY_CARE_PROVIDER_SITE_OTHER): Payer: 59 | Admitting: Family Medicine

## 2022-04-21 VITALS — BP 126/89 | HR 61 | Ht 66.0 in | Wt 183.5 lb

## 2022-04-21 DIAGNOSIS — R058 Other specified cough: Secondary | ICD-10-CM | POA: Diagnosis not present

## 2022-04-21 DIAGNOSIS — G8929 Other chronic pain: Secondary | ICD-10-CM

## 2022-04-21 DIAGNOSIS — E663 Overweight: Secondary | ICD-10-CM | POA: Insufficient documentation

## 2022-04-21 DIAGNOSIS — D071 Carcinoma in situ of vulva: Secondary | ICD-10-CM | POA: Diagnosis not present

## 2022-04-21 DIAGNOSIS — M5441 Lumbago with sciatica, right side: Secondary | ICD-10-CM

## 2022-04-21 MED ORDER — TRAMADOL HCL 50 MG PO TABS: 50.0000 mg | ORAL_TABLET | Freq: Three times a day (TID) | ORAL | 0 refills | Status: DC | PRN

## 2022-04-21 MED ORDER — ALBUTEROL SULFATE HFA 108 (90 BASE) MCG/ACT IN AERS: 2.0000 | INHALATION_SPRAY | Freq: Four times a day (QID) | RESPIRATORY_TRACT | 2 refills | Status: DC | PRN

## 2022-04-21 NOTE — Assessment & Plan Note (Addendum)
Last visit patient likely had COPD exacerbation with Dr. Jinny Sanders. CXR consistent with hyperinflation, and is likely given patient's smoking history. Will refer to Dr. Valentina Lucks for pulmonary function testing. Pending results consider initiation of controller therapy.

## 2022-04-21 NOTE — Assessment & Plan Note (Signed)
>>  ASSESSMENT AND PLAN FOR PRODUCTIVE COUGH WRITTEN ON 04/21/2022  2:43 PM BY Celine Mans, MD  Last visit patient likely had COPD exacerbation with Dr. Laroy Apple. CXR consistent with hyperinflation, and is likely given patient's smoking history. Will refer to Dr. Raymondo Band for pulmonary function testing. Pending results consider initiation of controller therapy.

## 2022-04-21 NOTE — Patient Instructions (Addendum)
It was great to see you! Thank you for allowing me to participate in your care!  I recommend that you always bring your medications to each appointment as this makes it easy to ensure we are on the correct medications and helps [place] not miss when refills are needed.  Our plans for today:  - Please schedule up at the front desk an appointment with Dr. Valentina Lucks to do your lung testing.  - I have printed out a food diary for you for you to fill out. - I have updated your tramadol prescription.   Please arrive 15 minutes PRIOR to your next scheduled appointment time! If you do not, this affects OTHER patients' care.  Take care and seek immediate care sooner if you develop any concerns.   Dr. Salvadore Oxford, MD [place]

## 2022-04-21 NOTE — Assessment & Plan Note (Signed)
Reports that decrease in Tramadol from 131m TID to 577mtwice a day was too much of a decrease from last visit and is limiting her functionality. Would like to to move to TID. Prescription sent for 5052mID as needed for chronic pain.

## 2022-04-21 NOTE — Assessment & Plan Note (Addendum)
Patient upset about 10lb increase in weight over past month. Would like to start make changes for weight loss. Created SMART goal together to reduce popcorn snacking at night, but replacing half with grapes and then moving to popcorn. Also provided food diary which patient will bring at follow-up visit.

## 2022-04-21 NOTE — Assessment & Plan Note (Signed)
Patient states she had pap smear done in 2023 at [facility].

## 2022-04-21 NOTE — Assessment & Plan Note (Addendum)
Patient states she had pap smear done in 2023 at [facility], I do not see record of that in last note from [facility]. Per chart review from Dr. Radford Pax in 2022 patient requires lifetimes pap surveillance for VIN3 despite previous hysterectomy. Plan to discuss further with patient at next visit.

## 2022-04-29 ENCOUNTER — Encounter: Payer: Self-pay | Admitting: Pharmacist

## 2022-04-29 ENCOUNTER — Other Ambulatory Visit: Payer: Self-pay

## 2022-04-29 ENCOUNTER — Ambulatory Visit (INDEPENDENT_AMBULATORY_CARE_PROVIDER_SITE_OTHER): Payer: 59 | Admitting: Pharmacist

## 2022-04-29 ENCOUNTER — Ambulatory Visit: Payer: 59 | Attending: Family Medicine

## 2022-04-29 VITALS — BP 132/89 | HR 51 | Ht 68.5 in | Wt 182.8 lb

## 2022-04-29 DIAGNOSIS — J4489 Other specified chronic obstructive pulmonary disease: Secondary | ICD-10-CM

## 2022-04-29 DIAGNOSIS — G8929 Other chronic pain: Secondary | ICD-10-CM | POA: Insufficient documentation

## 2022-04-29 DIAGNOSIS — M25551 Pain in right hip: Secondary | ICD-10-CM | POA: Insufficient documentation

## 2022-04-29 DIAGNOSIS — M6281 Muscle weakness (generalized): Secondary | ICD-10-CM | POA: Insufficient documentation

## 2022-04-29 DIAGNOSIS — M5441 Lumbago with sciatica, right side: Secondary | ICD-10-CM | POA: Diagnosis not present

## 2022-04-29 DIAGNOSIS — R2689 Other abnormalities of gait and mobility: Secondary | ICD-10-CM | POA: Diagnosis not present

## 2022-04-29 MED ORDER — BREZTRI AEROSPHERE 160-9-4.8 MCG/ACT IN AERO: 2.0000 | INHALATION_SPRAY | Freq: Two times a day (BID) | RESPIRATORY_TRACT | 11 refills | Status: DC

## 2022-04-29 NOTE — Assessment & Plan Note (Signed)
Patient has been experiencing cough, congestion and wheezing for a few weeks and taking rescue albuterol inhaler every day. Spirometry evaluation with pre- and post-bronchodilator reveals asthma/COPD overlap. Post-bronchodilator reveals reversibility. Spirometry [facility] based on CAT score and 1 exacerbation in the last year not requiring hospitalization.  Patient with good inhaler technique.  -Begin Breztri Aerosphere 2 puffs twice daily. (budesonide-formoterol-glycopyrrolate) 160-9-4.8 mcg/act -Did not refill albuterol nebulizing soln.  -Educated patient on purpose, proper use, potential adverse effects including risk of esophageal candidiasis and need to rinse mouth after each use.   -Reviewed results of pulmonary function tests.  Pt verbalized understanding of results and education.

## 2022-04-29 NOTE — Progress Notes (Signed)
   S:     Chief Complaint  Patient presents with   Medication Management    PFT   Courtney Hamilton is a 62 y.o. female who presents for lung function evaluation.  PMH is significant for chronic bronchitis and probable COPD exacerbation last month 03/2022.  Patient was referred and last seen by Primary Care Provider, Dr. Ruben Im, on 04/21/2022.   At last visit with Dr. Ruben Im, patient was complaining of recent weight gain. Patient also reported cough and congestion had been treated with steroids and azithromycin. Steroid use after 03/2022 moderate COPD exacerbation not requiring hospitalization could be contributing to weight gain.   Family history: Mother (deceased) had emphysema that required oxygen; Father living with COPD not requiring oxygen. Patient reports asthma also being common in her family.   Patient reports breathing has been a 5/10.   Medication adherence reported. Patient reports last dose of COPD medications:  Current COPD medications: albuterol inhaler and albuterol nebulizer Rescue inhaler use frequency: daily Patient recent exacerbation hx: 03/2022  O: Review of Systems  Respiratory:  Positive for cough, sputum production and wheezing.   All other systems reviewed and are negative.   Physical Exam Constitutional:      Appearance: Normal appearance.  Pulmonary:     Effort: Pulmonary effort is normal.  Neurological:     Mental Status: She is alert.  Psychiatric:        Mood and Affect: Mood normal.        Behavior: Behavior normal.        Thought Content: Thought content normal.        Judgment: Judgment normal.     Vitals:   04/29/22 1034  BP: 132/89  Pulse: (!) 51  SpO2: 100%   CAT score= 18 See Documentation Flowsheet - CAT/COPD for complete symptom scoring.  See "scanned report" or Documentation Flowsheet (discrete results - PFTs) for Spirometry results. Patient provided good effort while attempting spirometry.   Lung Age = 89 Albuterol Neb   Lot# [id number]     Exp. Feb-25  A/P: Patient has been experiencing cough, congestion and wheezing for a few weeks and taking rescue albuterol inhaler every day. Spirometry evaluation with pre- and post-bronchodilator reveals asthma/COPD overlap. Post-bronchodilator reveals reversibility. Spirometry [facility] based on CAT score and 1 exacerbation in the last year not requiring hospitalization.  Patient with good inhaler technique.  -Begin Breztri Aerosphere 2 puffs twice daily. (budesonide-formoterol-glycopyrrolate) 160-9-4.8 mcg/act -Did not refill albuterol nebulizing soln.  -Educated patient on purpose, proper use, potential adverse effects including risk of esophageal candidiasis and need to rinse mouth after each use.   -Reviewed results of pulmonary function tests.  Pt verbalized understanding of results and education.   - Treatment plan discussed with Dr. Gwendlyn Deutscher.   Post-surgical menopausal symptoms uncontrolled with low dose estrogen replacement.  Deferred management to PCP, Dr. Ruben Im.  Suggest consideration of trail with higher dose estrogen given minimal response in patient with BMI of ~ 27.   Written patient instructions provided.   Total time in face to face counseling 50 minutes.    Follow-up:  PCP clinic visit next week on 05/04/2022.  Patient seen with Francena Hanly, PharmD PGY-1 Pharmacy Resident and Dixon Boos, PharmD Candidate.

## 2022-04-29 NOTE — Therapy (Signed)
OUTPATIENT PHYSICAL THERAPY BACK/HIP   Patient Name: Courtney Hamilton MRN: [id number] DOB:12-Jul-1960, 62 y.o., female Today's Date: 04/29/2022  END OF SESSION:  PT End of Session - 04/29/22 1310     Visit Number 1    Number of Visits 17    Date for PT Re-Evaluation 06/24/22    Authorization Type [employer] Medicare    PT Start Time 1227    PT Stop Time 1306    PT Time Calculation (min) 39 min    Activity Tolerance Patient tolerated treatment well    Behavior During Therapy [facility] for tasks assessed/performed             Past Medical History:  Diagnosis Date   Arthritis    right hip   Bronchitis last time  sept/oct 2021   Flu-like symptoms 05/25/2016   Fungal infection of skin of abdomen 06/16/2017   Hepatitis 1980's   NON A NON B tx for 1980's resolved   Herniated disc 10/07/2011   lower back   History of chronic kidney disease 1980's   resolved   HIV (human immunodeficiency virus infection) ([place]) 1985   Hypertension    Lower abdominal pain 10/08/2014   Lower GI bleed 05/10/2012   pt denies   Lower leg edema 12/10/2011   Neuropathy    resolved   Osteoporosis    Panic attacks 12/22/2013   Recurrent UTI 08/06/2011   Ruptured lumbar disc    Sciatica    right side   Substance abuse ([place])    past hsitory  clean more than 20 years    TB (pulmonary tuberculosis) 1993   exposure, treated    Thickened endometrium    VIN III (vulvar intraepithelial neoplasia III)    Past Surgical History:  Procedure Laterality Date   APPENDECTOMY  1980's   BREAST EXCISIONAL BIOPSY Left 2001 per pt   cyst removed    CHOLECYSTECTOMY  123456   CO2 LASER APPLICATION N/A [id number]   Procedure: CO2 LASER APPLICATION OF VULVA (PERI-CLITORAL AND PERI-ANAL);  Surgeon: Lafonda Mosses, MD;  Location: [facility];  Service: Gynecology;  Laterality: N/A;   ECTOPIC PREGNANCY SURGERY  1985   injections to lower back  09/2017   LAPAROSCOPY N/A 07/03/2019   Procedure: LAPAROSCOPY  DIAGNOSTIC; LYSIS OF ADHESIONS;  Surgeon: Lafonda Mosses, MD;  Location: [facility];  Service: Gynecology;  Laterality: N/A;   ROBOTIC ASSISTED TOTAL HYSTERECTOMY WITH BILATERAL SALPINGO OOPHERECTOMY N/A 07/03/2019   Procedure: XI ROBOTIC ASSISTED TOTAL HYSTERECTOMY WITH UNILATERAL SALPINGO OOPHORECTOMY;  Surgeon: Lafonda Mosses, MD;  Location: [facility];  Service: Gynecology;  Laterality: N/A;   SALIVARY GLAND SURGERY  1990   VULVECTOMY N/A 01/18/2018   Procedure: PARTIAL VULVECTOMY;  Surgeon: [name], MD;  Location[facility];  Service: Gynecology;  Laterality: N/A;   Patient Active Problem List   Diagnosis Date Noted   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   Overweight (BMI 25.0-29.9) 04/21/2022   Healthcare maintenance 03/14/2022   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   Cervical stenosis (uterine cervix)    Essential hypertension 08/05/2017   Head injury 11/05/2015   Insomnia 11/05/2015   Former cigarette smoker 11/05/2015   Lumbosacral spondylosis without myelopathy 09/29/2013   Productive cough 06/23/2013   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   Herpes simplex 10/07/2011   Peripheral neuropathy 10/07/2011   Hypercholesteremia 10/07/2011   History of tuberculosis exposure 10/01/2011   HIV (human immunodeficiency virus infection) ([place]) 08/03/2011  Sleep disorder 08/03/2011   Chronic low back pain with sciatica 07/21/2011   Osteoporosis 03/09/2008    PCP: Salvadore Oxford, MD  REFERRING PROVIDER: Martyn Malay, MD   REFERRING DIAG: [phone] (ICD-10-CM) - Chronic bilateral low back pain with right-sided sciatica   Rationale for Evaluation and Treatment: Rehabilitation  THERAPY DIAG:  Muscle weakness (generalized) - Plan: PT plan of care cert/re-cert  Pain in right hip - Plan: PT plan of care cert/re-cert  Other abnormalities of gait and mobility - Plan: PT plan of care cert/re-cert  ONSET DATE:  Chronic  SUBJECTIVE:                                                                                                                                                                                           SUBJECTIVE STATEMENT: Pt presents to PT with reports of chronic R hip and thigh pain. Denies N/T down R LE, does promote symptoms of sciatica in past. No falls, acute trauma, or MOI; states gradual onset of increasing R hip pain. Would like to get back to walking and exercising more without pain.   PERTINENT HISTORY:  Osteoporosis, HTN, HIV  PAIN:  Are you having pain?  Yes: NPRS scale: 0/10 Worst: 10/10 Pain location: R hip Pain description: sharp Aggravating factors: prolonged standing, walking Relieving factors: rest, medication  PRECAUTIONS: None  WEIGHT BEARING RESTRICTIONS: No  FALLS:  Has patient fallen in last 6 months? No  LIVING ENVIRONMENT: Lives with: lives with their family Lives in: House/apartment  OCCUPATION: Not working  PLOF: Independent  PATIENT GOALS: would like to get back to walking more without pain  OBJECTIVE:   DIAGNOSTIC FINDINGS:  N/A  PATIENT SURVEYS:  FOTO: 43% function; 59% predicted  COGNITION: Overall cognitive status: Within functional limits for tasks assessed    SENSATION: Normal  POSTURE: rounded shoulders and forward head  PALPATION: TTP to R piriformis  LOWER EXTREMITY MMT:    MMT Right eval Left eval  Hip flexion 4/5 5/5  Hip extension    Hip abduction 4/5 5/5  Hip adduction    Hip internal rotation    Hip external rotation    Knee flexion    Knee extension    Ankle dorsiflexion    Ankle plantarflexion    Ankle inversion    Ankle eversion     (Blank rows = not tested)  LUMBAR SPECIAL TESTS:  Straight leg raise test: Negative and Slump test: Negative  FUNCTIONAL TESTS:  30 Second Sit to Stand: 9 reps - increased pain  GAIT: Distance walked: 73f Assistive device utilized: None Level of  assistance: Complete Independence Comments: antalgic  gait R LE  TREATMENT: [facility] Adult PT Treatment:                                                DATE: 04/29/2022 Therapeutic Exercise: S/L clamshell x 5 RTB Supine piriformis x 30" R Seated fig 4 stretch x 30" Piriformis release with tennis ball x 60" R  PATIENT EDUCATION:  Education details: eval findings, FOTO, HEP, POC Person educated: Patient Education method: Explanation, Demonstration, and Handouts Education comprehension: verbalized understanding and returned demonstration  HOME EXERCISE PROGRAM: Access Code: [email or web address] URL: [email or web address] Date: 04/29/2022 Prepared by: Octavio Manns  Exercises - Clamshell with Resistance  - 1 x daily - 7 x weekly - 3 sets - 10 reps - red band hold - Supine Piriformis Stretch with Leg Straight  - 1 x daily - 7 x weekly - 2 reps - 30 sec hold - Seated Figure 4 Piriformis Stretch  - 1 x daily - 7 x weekly - 2 reps - 30 sec hold - Standing Piriformis Release with Ball at [place]  - 1 x daily - 7 x weekly - 2 min hold  ASSESSMENT:  CLINICAL IMPRESSION: Patient is a 62 y.o. F who was seen today for physical therapy evaluation and treatment for chronic R hip pain. Physical findings are consistent with MD impression as pt has proximal hip weakness and decrease in overall functional mobility. Her FOTO score shows she is operating below PLOF and would benefit from skilled PT working on increasing strength to reduce pain.   OBJECTIVE IMPAIRMENTS: decreased activity tolerance, decreased mobility, difficulty walking, decreased ROM, decreased strength, and pain.   ACTIVITY LIMITATIONS: sitting, standing, squatting, and stairs  PARTICIPATION LIMITATIONS: driving, shopping, community activity, and yard work  PERSONAL FACTORS: Time since onset of injury/illness/exacerbation and 3+ comorbidities: Osteoporosis, HTN, HIV  are also affecting patient's functional outcome.   REHAB POTENTIAL:  Excellent  CLINICAL DECISION MAKING: Stable/uncomplicated  EVALUATION COMPLEXITY: Low   GOALS: Goals reviewed with patient? No  SHORT TERM GOALS: Target date: 05/20/2022   Pt will be compliant and knowledgeable with initial HEP for improved comfort and carryover Baseline: initial HEP given  Goal status: INITIAL  2.  Pt will self report right hip pain no greater than 6/10 for improved comfort and functional ability Baseline: 10/10 at worst Goal status: INITIAL   LONG TERM GOALS: Target date: 06/24/2022   Pt will improve FOTO function score to no less than 59% as proxy for functional improvement Baseline: 43% function Goal status: INITIAL   2.  Pt will increase 30 Second Sit to Stand rep count to no less than 11 reps for improved balance, strength, and functional mobility Baseline: 9 reps (MCID 2) Goal status: INITIAL   3.  Pt will self report right hip pain no greater than 2-3/10 for improved comfort and functional ability Baseline: 10/10 at worst Goal status: INITIAL   4.  Pt will improve LE MMT to no less than 5/5 for tested motions for improved comfort and function Baseline: see chart Goal status: INITIAL   PLAN:  PT FREQUENCY: 2x/week  PT DURATION: 8 weeks  PLANNED INTERVENTIONS: Therapeutic exercises, Therapeutic activity, Neuromuscular re-education, Balance training, Gait training, Patient/Family education, Self Care, Joint mobilization, Aquatic Therapy, Dry Needling, Electrical stimulation, Cryotherapy, Moist heat, Manual therapy, and Re-evaluation.  PLAN FOR NEXT SESSION: assess HEP response, proximal hip strengthening,  TPDN   Ward Chatters, PT 04/29/2022, 1:26 PM

## 2022-04-29 NOTE — Patient Instructions (Signed)
It was nice to see you today!  Medication Changes: Continue albuterol inhaler 2 puffs as needed for shortness of breath.  Begin Breztri Aerosphere (budesonide-formoterol-glycopyrr0l) 2 puffs 2 times daily.  We hope you will feel better and that your cough/sleep will improve.  Keep up the good work with diet and exercise. Aim for a diet full of vegetables, fruit and lean meats (chicken, [place], fish). Try to limit salt intake by eating fresh or frozen vegetables (instead of canned), rinse canned vegetables prior to cooking and do not add any additional salt to meals.

## 2022-04-30 NOTE — Progress Notes (Signed)
Reviewed and agree with Dr Graylin Shiver plan.

## 2022-05-04 ENCOUNTER — Ambulatory Visit: Payer: 59 | Admitting: Family Medicine

## 2022-05-04 ENCOUNTER — Other Ambulatory Visit (HOSPITAL_COMMUNITY): Payer: Self-pay

## 2022-05-04 ENCOUNTER — Other Ambulatory Visit: Payer: Self-pay

## 2022-05-05 ENCOUNTER — Telehealth: Payer: Self-pay

## 2022-05-05 NOTE — Telephone Encounter (Signed)
Contacted patient and discussed new complaint of increased white phlegm production.   Patient denies fever or increased shortness of breath.  She reports rinsing and spitting after each use of her Breztri (3 drug inhaler).   She states her breathing is improved and she feels like the production may be her lungs clearing her most recent infection.   I asked her to maintain high level of hydration, continue to monitor symptoms, and call if she had worsening shortness of breath or fever in addition to her sputum production.  She was thankful for the follow-up and verbalized understanding of treatment plan.

## 2022-05-05 NOTE — Telephone Encounter (Signed)
Patient calls nurse line in regards to [facility].   She reports she started taking the medication, however reports ~ 2 days later she started coughing up white phlegm.   Patient denies any other symptoms. No fevers, sore throat or congestion.   She is curious if medication causes this as a "side effect." Of if symptoms are to be expected.   Will forward to PCP and [place] for advisement.

## 2022-05-06 ENCOUNTER — Ambulatory Visit
Admission: RE | Admit: 2022-05-06 | Discharge: 2022-05-06 | Disposition: A | Discharge status: Home / Self Care | Payer: 59 | Source: Ambulatory Visit | Attending: Family Medicine | Admitting: Family Medicine

## 2022-05-06 DIAGNOSIS — M7581 Other shoulder lesions, right shoulder: Secondary | ICD-10-CM | POA: Diagnosis not present

## 2022-05-06 DIAGNOSIS — Z1231 Encounter for screening mammogram for malignant neoplasm of breast: Secondary | ICD-10-CM

## 2022-05-06 NOTE — Telephone Encounter (Signed)
Reviewed and agree with Dr Graylin Shiver plan.

## 2022-05-11 ENCOUNTER — Ambulatory Visit: Payer: 59

## 2022-05-13 ENCOUNTER — Ambulatory Visit: Payer: 59

## 2022-05-13 ENCOUNTER — Other Ambulatory Visit (HOSPITAL_COMMUNITY): Payer: Self-pay

## 2022-05-13 ENCOUNTER — Telehealth: Payer: Self-pay

## 2022-05-13 NOTE — Telephone Encounter (Signed)
Patient calls nurse line stating that small part of denture came off and she swallowed it.   She reports that this happened last night.   No difficulty breathing or chest pain. She has had these dentures for over 20 years.   She has ate small piece of bread and has been drinking water since incident.   She describes feeling like "it is stuck in my chest."  Spoke with Dr. Thompson Grayer who recommended that patient be evaluated today or tomorrow. We do not have anymore appointment availability for today. Scheduled patient for tomorrow.   Provided with strict ED precautions.   Talbot Grumbling, RN

## 2022-05-14 ENCOUNTER — Ambulatory Visit (INDEPENDENT_AMBULATORY_CARE_PROVIDER_SITE_OTHER): Payer: 59 | Admitting: Family Medicine

## 2022-05-14 ENCOUNTER — Encounter: Payer: Self-pay | Admitting: Family Medicine

## 2022-05-14 VITALS — BP 122/67 | HR 54 | Ht 68.0 in | Wt 185.4 lb

## 2022-05-14 DIAGNOSIS — T189XXA Foreign body of alimentary tract, part unspecified, initial encounter: Secondary | ICD-10-CM | POA: Diagnosis not present

## 2022-05-14 DIAGNOSIS — Z23 Encounter for immunization: Secondary | ICD-10-CM

## 2022-05-14 NOTE — Patient Instructions (Addendum)
It was nice seeing you today!  I think since you are feeling better and since the tooth is small, you do not actually need x-rays.  You do not need to check for the tooth in your stool.  If you are developing signs of abdominal pain, nausea, or vomiting, please let us know or proceed to the emergency room.  Stay well, Zola Button, MD [place] [phone]  --  Make sure to check out at the front desk before you leave today.  Please arrive at least 15 minutes prior to your scheduled appointments.  If you had blood work today, I will send you a MyChart message or a letter if results are normal. Otherwise, I will give you a call.  If you had a referral placed, they will call you to set up an appointment. Please give [place] a call if you don't hear back in the next 2 weeks.  If you need additional refills before your next appointment, please call your pharmacy first.

## 2022-05-14 NOTE — Progress Notes (Signed)
    SUBJECTIVE:   CHIEF COMPLAINT / HPI: Foreign body ingestion   Patient's denture broke off (1 tooth, front incisor) accidentally when eating 2 nights ago Did have some dysphagia (felt like something was stuck in her throat) but this has resolved Had a BM this AM and checked for the tooth but did not find it Denies any abdominal pain, nausea, vomiting, blood in stool  She has an appointment to get new dentures next week  PERTINENT  PMH / PSH: Constipation  Patient Care Team: Salvadore Oxford, MD as PCP - General (Family Medicine) Carlyle Basques, MD as PCP - Infectious Diseases (Infectious Diseases)   OBJECTIVE:   BP 122/67   Pulse (!) 54   Ht '5\' 8"'$  (1.727 m)   Wt 185 lb 6 oz (84.1 kg)   SpO2 100%   BMI 28.19 kg/m   Physical Exam Constitutional:      General: She is not in acute distress. HENT:     Head: Normocephalic and atraumatic.     Mouth/Throat:     Mouth: Mucous membranes are moist.     Pharynx: Oropharynx is clear. No oropharyngeal exudate or posterior oropharyngeal erythema.  Cardiovascular:     Rate and Rhythm: Normal rate and regular rhythm.  Pulmonary:     Effort: Pulmonary effort is normal. No respiratory distress.     Breath sounds: Normal breath sounds.  Abdominal:     Palpations: Abdomen is soft.     Tenderness: There is no abdominal tenderness.  Musculoskeletal:     Cervical back: Neck supple.  Neurological:     Mental Status: She is alert.         05/14/2022   10:33 AM  Depression screen PHQ 2/9  Decreased Interest 0  Down, Depressed, Hopeless 0  PHQ - 2 Score 0  Altered sleeping 2  Tired, decreased energy 0  Change in appetite 1  Feeling bad or failure about yourself  0  Trouble concentrating 0  Moving slowly or fidgety/restless 0  Suicidal thoughts 0  PHQ-9 Score 3  Difficult doing work/chores Somewhat difficult     Wt Readings from Last 3 Encounters:  05/14/22 185 lb 6 oz (84.1 kg)  04/29/22 182 lb 12.8 oz (82.9 kg)   04/21/22 183 lb 8 oz (83.2 kg)        ASSESSMENT/PLAN:   Swallowed foreign body, initial encounter  She is 3 days out from unintentional foreign body ingestion of tooth from her denture.  Initially was some mild dysphagia which is now resolved, currently asymptomatic.  Given small size and now that she is asymptomatic, do not think she needs further workup or imaging.  Believe it will pass spontaneously. - return precautions discussed  Return if symptoms worsen or fail to improve.   Zola Button, MD [place]

## 2022-05-20 ENCOUNTER — Ambulatory Visit: Payer: 59

## 2022-05-25 ENCOUNTER — Other Ambulatory Visit: Payer: Self-pay | Admitting: Internal Medicine

## 2022-05-25 NOTE — Telephone Encounter (Signed)
Okay to refill? 

## 2022-05-26 ENCOUNTER — Other Ambulatory Visit (HOSPITAL_COMMUNITY): Payer: Self-pay

## 2022-05-28 ENCOUNTER — Ambulatory Visit: Payer: 59

## 2022-05-28 NOTE — Progress Notes (Deleted)
    SUBJECTIVE:   CHIEF COMPLAINT / HPI:   Follow-up - Accidentally swallowed part of tooth from her dentures. Seen 05/14/22 by Dr. Nancy Fetter, recommended observation.  Smoking history clarification -   ID Follow-Up?  Referral to OB/Gyn - Pap smear  PERTINENT  PMH / PSH: COPD with Asthma, HTN, HIV, VIN3   OBJECTIVE:   There were no vitals taken for this visit.  ***  ASSESSMENT/PLAN:   There are no diagnoses linked to this encounter. No follow-ups on file.  Salvadore Oxford, MD [place]

## 2022-05-29 ENCOUNTER — Ambulatory Visit: Payer: 59 | Admitting: Family Medicine

## 2022-06-05 ENCOUNTER — Other Ambulatory Visit (HOSPITAL_COMMUNITY): Payer: Self-pay

## 2022-06-05 ENCOUNTER — Other Ambulatory Visit: Payer: Self-pay

## 2022-06-08 ENCOUNTER — Other Ambulatory Visit: Payer: Self-pay | Admitting: Family Medicine

## 2022-06-08 ENCOUNTER — Other Ambulatory Visit: Payer: Self-pay | Admitting: Obstetrics

## 2022-06-08 DIAGNOSIS — G8929 Other chronic pain: Secondary | ICD-10-CM

## 2022-06-08 DIAGNOSIS — N951 Menopausal and female climacteric states: Secondary | ICD-10-CM

## 2022-06-09 ENCOUNTER — Other Ambulatory Visit: Payer: Self-pay | Admitting: Obstetrics

## 2022-06-09 ENCOUNTER — Other Ambulatory Visit: Payer: Self-pay

## 2022-06-09 DIAGNOSIS — G8929 Other chronic pain: Secondary | ICD-10-CM

## 2022-06-11 ENCOUNTER — Telehealth: Payer: Self-pay

## 2022-06-11 DIAGNOSIS — G8929 Other chronic pain: Secondary | ICD-10-CM

## 2022-06-11 MED ORDER — TRAMADOL HCL 50 MG PO TABS: 50.0000 mg | ORAL_TABLET | Freq: Three times a day (TID) | ORAL | 0 refills | Status: DC | PRN

## 2022-06-11 NOTE — Telephone Encounter (Signed)
Patient LVM on nurse line regarding Tramadol refill. She states that she is completely out.   Medication previously pended to request on 06/09/22.   Talbot Grumbling, RN

## 2022-06-17 ENCOUNTER — Other Ambulatory Visit: Payer: Self-pay | Admitting: Student

## 2022-06-17 DIAGNOSIS — R058 Other specified cough: Secondary | ICD-10-CM

## 2022-06-25 ENCOUNTER — Ambulatory Visit: Payer: 59 | Admitting: Family Medicine

## 2022-06-25 ENCOUNTER — Other Ambulatory Visit (HOSPITAL_COMMUNITY): Payer: Self-pay

## 2022-06-25 NOTE — Progress Notes (Deleted)
    SUBJECTIVE:   CHIEF COMPLAINT / HPI:   Weight -   Smoking history -> Lung cancer screening  Colonoscopy  PERTINENT  PMH / PSH: HTN, HIV, VIN3   OBJECTIVE:   There were no vitals taken for this visit.  ***  ASSESSMENT/PLAN:   There are no diagnoses linked to this encounter. No follow-ups on file.  Celine Mans, MD [facility]

## 2022-06-29 ENCOUNTER — Other Ambulatory Visit (HOSPITAL_COMMUNITY): Payer: Self-pay

## 2022-06-30 ENCOUNTER — Encounter: Payer: Self-pay | Admitting: Family Medicine

## 2022-06-30 ENCOUNTER — Ambulatory Visit (INDEPENDENT_AMBULATORY_CARE_PROVIDER_SITE_OTHER): Payer: 59 | Admitting: Family Medicine

## 2022-06-30 VITALS — BP 128/85 | HR 52 | Ht 68.0 in | Wt 187.8 lb

## 2022-06-30 DIAGNOSIS — M7581 Other shoulder lesions, right shoulder: Secondary | ICD-10-CM | POA: Insufficient documentation

## 2022-06-30 DIAGNOSIS — Z87891 Personal history of nicotine dependence: Secondary | ICD-10-CM | POA: Diagnosis not present

## 2022-06-30 DIAGNOSIS — E663 Overweight: Secondary | ICD-10-CM

## 2022-06-30 DIAGNOSIS — D071 Carcinoma in situ of vulva: Secondary | ICD-10-CM

## 2022-06-30 DIAGNOSIS — M7541 Impingement syndrome of right shoulder: Secondary | ICD-10-CM | POA: Insufficient documentation

## 2022-06-30 NOTE — Assessment & Plan Note (Addendum)
Created SMART goals regarding popcorn intake today. Discussed small healthy eating habit changes. SMART goal: -4 nights per week 2 bags of popcorn, + low cal fruit (grapes, pomegranate) -3 night per week her normal 3 bags of popcorn  Follow-up progress towards goals. Patient declared understanding of slow changes to eating habits. Discussed healthy sleep hygiene.

## 2022-06-30 NOTE — Progress Notes (Signed)
    SUBJECTIVE:   CHIEF COMPLAINT / HPI: nutrition follow-up  Overweight - Has not reduced popcorn intake since last appointment with me. Did bring in 48hrs diet. Notable for popcorn, fried fish, and other fried food. Patient states she is eating 3 bags of flavored popcorn before she goes to sleep each night.  Health maintenance - Refer to OB/Gyn for Pap smear. Willing to see OB/Gyn.  Clarified smoking history - Smoking, started Age 62, stopped 2018, pk and a half to 1 pk per day. Did not get to 2.  PERTINENT  PMH / PSH: HIV, HTN, COPD, Asthma, Chronic Back Pain   OBJECTIVE:   BP 128/85   Pulse (!) 52   Ht  (1.727 m)   Wt 85.2 kg   SpO2 100%   BMI 28.55 kg/m   General: NAD, well appearing Respiratory: normal WOB on RA Extremities: Moving all 4 extremities equally  ASSESSMENT/PLAN:   Former cigarette smoker Assessment & Plan: History tab updated.  Orders: -     CT CHEST LUNG CANCER SCREENING LOW DOSE WO CONTRAST; Future  Vulvar intraepithelial neoplasia (VIN) grade 3 Assessment & Plan: Will refer to OB/Gyn as previously followed by them at [facility]. Patient agreeable to this for Pap smears.   Orders: -     Ambulatory referral to Gynecology  Overweight (BMI 25.0-29.9) Assessment & Plan: Created SMART goals regarding popcorn intake today. Discussed small healthy eating habit changes. SMART goal: -4 nights per week 2 bags of popcorn, + low cal fruit (grapes, pomegranate) -3 night per week her normal 3 bags of popcorn  Follow-up progress towards goals. Patient declared understanding of slow changes to eating habits. Discussed healthy sleep hygiene.    Return in about 2 weeks (around 07/14/2022).  Celine Mans, MD [facility]

## 2022-06-30 NOTE — Assessment & Plan Note (Signed)
History tab updated.

## 2022-06-30 NOTE — Assessment & Plan Note (Signed)
Will refer to OB/Gyn as previously followed by them at [facility]. Patient agreeable to this for Pap smears.

## 2022-06-30 NOTE — Patient Instructions (Addendum)
It was great to see you! Thank you for allowing me to participate in your care!  I recommend that you always bring your medications to each appointment as this makes it easy to ensure we are on the correct medications and helps [place] not miss when refills are needed.  Our plans for today:  - Please follow the SMART goal we discussed today. - I have made referrals to Gynecology for your pap smear. - I have order a lung cancer screening CT.   Please arrive 15 minutes PRIOR to your next scheduled appointment time! If you do not, this affects OTHER patients' care.  Take care and seek immediate care sooner if you develop any concerns.   Dr. Celine Mans, MD [facility]

## 2022-07-01 ENCOUNTER — Other Ambulatory Visit: Payer: Self-pay

## 2022-07-04 ENCOUNTER — Other Ambulatory Visit: Payer: Self-pay | Admitting: Obstetrics

## 2022-07-04 DIAGNOSIS — N951 Menopausal and female climacteric states: Secondary | ICD-10-CM

## 2022-07-05 ENCOUNTER — Other Ambulatory Visit: Payer: Self-pay | Admitting: Family Medicine

## 2022-07-05 DIAGNOSIS — G8929 Other chronic pain: Secondary | ICD-10-CM

## 2022-07-06 ENCOUNTER — Other Ambulatory Visit: Payer: Self-pay

## 2022-07-06 ENCOUNTER — Telehealth: Payer: Self-pay | Admitting: Family Medicine

## 2022-07-06 NOTE — Telephone Encounter (Signed)
Called patient to schedule Medicare Annual Wellness Visit (AWV). No voicemail available to leave a message.  Last date of AWV: AWVI eligible as of 03/09/2009  Please schedule an AWVI appointment at any time with [facility] ANNUAL WELLNESS VISIT.  If any questions, please contact me at [phone].    Thank you,  Mackena Plummer  Ambulatory Clinic Support [facility] Direct dial  [phone]   

## 2022-07-07 NOTE — Telephone Encounter (Signed)
Patient returns call to nurse line regarding medication refill on Tramadol.   Please advise if patient is needing an appointment prior to medication being refilled.   Veronda Prude, RN

## 2022-07-08 MED ORDER — TRAMADOL HCL 50 MG PO TABS: 50.0000 mg | ORAL_TABLET | Freq: Three times a day (TID) | ORAL | 0 refills | Status: DC | PRN

## 2022-07-08 NOTE — Addendum Note (Signed)
Addended by: Celine Mans on: 07/08/2022 09:31 AM   Modules accepted: Orders

## 2022-07-10 ENCOUNTER — Telehealth: Payer: Self-pay | Admitting: Student

## 2022-07-10 NOTE — Telephone Encounter (Signed)
**  After Hours/ Emergency Line Call**  Received a call to report that Courtney Hamilton needed assistance via after hours emergency line.  Unable to reach patient, sent to VM and VM box was full. Could not leave a VM. Will attempt again.   Alfredo Martinez MD PGY-2, [facility] 07/10/2022 6:49 PM

## 2022-07-10 NOTE — Telephone Encounter (Signed)
Three more attempts made to reach patient. Call immediately goes to voicemail each time, no option to leave voicemail since it is full.

## 2022-07-13 ENCOUNTER — Telehealth: Payer: Self-pay

## 2022-07-13 DIAGNOSIS — G8929 Other chronic pain: Secondary | ICD-10-CM

## 2022-07-13 MED ORDER — TRAMADOL HCL 50 MG PO TABS: 50.0000 mg | ORAL_TABLET | Freq: Three times a day (TID) | ORAL | 0 refills | Status: DC | PRN

## 2022-07-13 NOTE — Telephone Encounter (Signed)
Patient calls nurse line reporting difficulties picking up Tramadol prescription.   Prescription was sent in on 5/1 by PCP.   I called the pharmacy and they report patients insurance will not cover prescription with a resident DEA.   They ask an attending resend.   Will forward to morning preceptor.

## 2022-07-17 ENCOUNTER — Ambulatory Visit (HOSPITAL_COMMUNITY)
Admission: RE | Admit: 2022-07-17 | Discharge: 2022-07-17 | Disposition: A | Discharge status: Home / Self Care | Payer: 59 | Source: Ambulatory Visit | Attending: Family Medicine | Admitting: Family Medicine

## 2022-07-17 DIAGNOSIS — Z87891 Personal history of nicotine dependence: Secondary | ICD-10-CM | POA: Insufficient documentation

## 2022-07-21 ENCOUNTER — Other Ambulatory Visit (HOSPITAL_COMMUNITY): Payer: Self-pay

## 2022-07-23 ENCOUNTER — Other Ambulatory Visit (HOSPITAL_COMMUNITY): Payer: Self-pay

## 2022-07-27 ENCOUNTER — Other Ambulatory Visit: Payer: Self-pay | Admitting: Internal Medicine

## 2022-07-27 ENCOUNTER — Other Ambulatory Visit (HOSPITAL_COMMUNITY): Payer: Self-pay

## 2022-07-29 DIAGNOSIS — M21961 Unspecified acquired deformity of right lower leg: Secondary | ICD-10-CM | POA: Diagnosis not present

## 2022-07-29 DIAGNOSIS — D2371 Other benign neoplasm of skin of right lower limb, including hip: Secondary | ICD-10-CM | POA: Diagnosis not present

## 2022-07-29 DIAGNOSIS — I739 Peripheral vascular disease, unspecified: Secondary | ICD-10-CM | POA: Diagnosis not present

## 2022-07-29 DIAGNOSIS — D492 Neoplasm of unspecified behavior of bone, soft tissue, and skin: Secondary | ICD-10-CM | POA: Diagnosis not present

## 2022-07-31 ENCOUNTER — Other Ambulatory Visit (HOSPITAL_COMMUNITY): Payer: Self-pay

## 2022-08-02 ENCOUNTER — Other Ambulatory Visit: Payer: Self-pay | Admitting: Internal Medicine

## 2022-08-02 DIAGNOSIS — I10 Essential (primary) hypertension: Secondary | ICD-10-CM

## 2022-08-04 NOTE — Telephone Encounter (Signed)
Will have pharmacy send request to patient's PCP

## 2022-08-06 ENCOUNTER — Ambulatory Visit (INDEPENDENT_AMBULATORY_CARE_PROVIDER_SITE_OTHER): Payer: 59 | Admitting: Family Medicine

## 2022-08-06 ENCOUNTER — Encounter: Payer: Self-pay | Admitting: Family Medicine

## 2022-08-06 VITALS — BP 109/71 | HR 57 | Ht 68.0 in | Wt 186.8 lb

## 2022-08-06 DIAGNOSIS — G8929 Other chronic pain: Secondary | ICD-10-CM | POA: Diagnosis not present

## 2022-08-06 DIAGNOSIS — I10 Essential (primary) hypertension: Secondary | ICD-10-CM | POA: Diagnosis not present

## 2022-08-06 DIAGNOSIS — M5441 Lumbago with sciatica, right side: Secondary | ICD-10-CM

## 2022-08-06 MED ORDER — TRAMADOL HCL 50 MG PO TABS: 50.0000 mg | ORAL_TABLET | Freq: Three times a day (TID) | ORAL | 0 refills | Status: DC | PRN

## 2022-08-06 MED ORDER — GABAPENTIN 300 MG PO CAPS: 300.0000 mg | ORAL_CAPSULE | Freq: Three times a day (TID) | ORAL | 1 refills | Status: DC

## 2022-08-06 MED ORDER — LOSARTAN POTASSIUM 25 MG PO TABS: 25.0000 mg | ORAL_TABLET | Freq: Every day | ORAL | 1 refills | Status: DC

## 2022-08-06 NOTE — Assessment & Plan Note (Signed)
Pain manageable but affecting quality of life. Trial increasing Gabapentin to TID.

## 2022-08-06 NOTE — Patient Instructions (Addendum)
It was great to see you! Thank you for allowing me to participate in your care!  Our plans for today:  - Please make sure to schedule an appointment with your OB/Gyn provider for pap smear. - I have increased your Gabapentin to 3 times per day.  Please arrive 15 minutes PRIOR to your next scheduled appointment time! If you do not, this affects OTHER patients' care.  Take care and seek immediate care sooner if you develop any concerns.   Dr. Celine Mans, MD [facility]

## 2022-08-06 NOTE — Progress Notes (Signed)
    SUBJECTIVE:   CHIEF COMPLAINT / HPI: follow-up  Here for follow-up nutrition. Has had success with SMART goal created at last visit. Not eating as much popcorn.  Has not scheduled with [facility].   Would like refill of tramadol. Pain is still aggravating factor in her life. Affecting her ability to be active with her [employer]. States prior cortisone injections in back have not helped. Pain is largely burning pain.  PERTINENT  PMH / PSH: HTN, HIV, COPD  OBJECTIVE:   BP 109/71   Pulse (!) 57   Ht 5\' 8"  (1.727 m)   Wt 186 lb 12.8 oz (84.7 kg)   SpO2 100%   BMI 28.40 kg/m   General: NAD, well appearing Cardiovascular: RRR, no murmurs, no peripheral edema Respiratory: normal WOB on RA, CTAB, no wheezes, ronchi or rales Extremities: Moving all 4 extremities equally    ASSESSMENT/PLAN:   Chronic bilateral low back pain with right-sided sciatica Assessment & Plan: Pain manageable but affecting quality of life. Trial increasing Gabapentin to TID.   Orders: -     Gabapentin; Take 1 capsule (300 mg total) by mouth 3 (three) times daily.  Dispense: 90 capsule; Refill: 1 -     traMADol HCl; Take 1 tablet (50 mg total) by mouth 3 (three) times daily as needed for moderate pain.  Dispense: 90 tablet; Refill: 0  Essential hypertension -     Losartan Potassium; Take 1 tablet (25 mg total) by mouth at bedtime.  Dispense: 90 tablet; Refill: 1  Requested records from [facility] for previous Colonoscopy.  Return in about 3 months (around 11/06/2022).  Celine Mans, MD [facility]

## 2022-08-13 ENCOUNTER — Other Ambulatory Visit: Payer: Self-pay

## 2022-08-13 DIAGNOSIS — G8929 Other chronic pain: Secondary | ICD-10-CM

## 2022-08-13 MED ORDER — TRAMADOL HCL 50 MG PO TABS: 50.0000 mg | ORAL_TABLET | Freq: Three times a day (TID) | ORAL | 0 refills | Status: DC | PRN

## 2022-08-13 NOTE — Telephone Encounter (Signed)
Pt calling to let Dr Velna Ochs know that the pharmacy could not refill her tramadol because he put the wrong DEA number on the Rx. Patient is requesting this rx be resent as soon as possible as she is completely out of medicine. Please let her know when this has been done. Sunday Spillers, CMA

## 2022-08-13 NOTE — Telephone Encounter (Signed)
Tried reaching out to patient but no answer. Unable to leave voicemail due to her mailbox being full.

## 2022-08-18 ENCOUNTER — Encounter: Payer: Self-pay | Admitting: Obstetrics & Gynecology

## 2022-08-18 ENCOUNTER — Other Ambulatory Visit (HOSPITAL_COMMUNITY)
Admission: RE | Admit: 2022-08-18 | Discharge: 2022-08-18 | Disposition: A | Discharge status: Home / Self Care | Payer: 59 | Source: Ambulatory Visit | Attending: Obstetrics & Gynecology | Admitting: Obstetrics & Gynecology

## 2022-08-18 ENCOUNTER — Ambulatory Visit (INDEPENDENT_AMBULATORY_CARE_PROVIDER_SITE_OTHER): Payer: 59 | Admitting: Obstetrics & Gynecology

## 2022-08-18 VITALS — BP 115/73 | HR 50 | Ht 66.0 in | Wt 188.0 lb

## 2022-08-18 DIAGNOSIS — Z87891 Personal history of nicotine dependence: Secondary | ICD-10-CM | POA: Diagnosis not present

## 2022-08-18 DIAGNOSIS — Z01411 Encounter for gynecological examination (general) (routine) with abnormal findings: Secondary | ICD-10-CM | POA: Diagnosis present

## 2022-08-18 DIAGNOSIS — J449 Chronic obstructive pulmonary disease, unspecified: Secondary | ICD-10-CM | POA: Diagnosis not present

## 2022-08-18 DIAGNOSIS — B20 Human immunodeficiency virus [HIV] disease: Secondary | ICD-10-CM | POA: Diagnosis not present

## 2022-08-18 DIAGNOSIS — D071 Carcinoma in situ of vulva: Secondary | ICD-10-CM | POA: Insufficient documentation

## 2022-08-18 DIAGNOSIS — Z9889 Other specified postprocedural states: Secondary | ICD-10-CM | POA: Diagnosis not present

## 2022-08-18 DIAGNOSIS — Z1151 Encounter for screening for human papillomavirus (HPV): Secondary | ICD-10-CM | POA: Diagnosis not present

## 2022-08-18 NOTE — Progress Notes (Signed)
Patient ID: Courtney Hamilton, female   DOB: Jan 10, 1961, 62 y.o.   MRN: [id number]  Chief Complaint  Patient presents with   Annual Exam    HPI Courtney Hamilton is a 62 y.o. female.  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 Pricilla Holm. HPI  Past Medical History:  Diagnosis Date   Arthritis    right hip   Bronchitis last time  sept/oct 2021   Flu-like symptoms 05/25/2016   Fungal infection of skin of abdomen 06/16/2017   Hepatitis 1980's   NON A NON B tx for 1980's resolved   Herniated disc 10/07/2011   lower back   History of chronic kidney disease 1980's   resolved   HIV (human immunodeficiency virus infection) (HCC) 1985   Hypertension    Lower abdominal pain 10/08/2014   Lower GI bleed 05/10/2012   pt denies   Lower leg edema 12/10/2011   Neuropathy    resolved   Osteoporosis    Panic attacks 12/22/2013   Recurrent UTI 08/06/2011   Ruptured lumbar disc    Sciatica    right side   Substance abuse (HCC)    past hsitory  clean more than 20 years    TB (pulmonary tuberculosis) 1993   exposure, treated    Thickened endometrium    VIN III (vulvar intraepithelial neoplasia III)     Past Surgical History:  Procedure Laterality Date   APPENDECTOMY  1980's   BREAST EXCISIONAL BIOPSY Left 2001 per pt   cyst removed    CHOLECYSTECTOMY  1980's   CO2 LASER APPLICATION N/A 02/05/2020   Procedure: CO2 LASER APPLICATION OF VULVA (PERI-CLITORAL AND PERI-ANAL);  Surgeon: Carver Fila, MD;  [facility];  Service: Gynecology;  Laterality: N/A;   ECTOPIC PREGNANCY SURGERY  1985   injections to lower back  09/2017   LAPAROSCOPY N/A 07/03/2019   Procedure: LAPAROSCOPY DIAGNOSTIC; LYSIS OF ADHESIONS;  Surgeon: Carver Fila, MD;  Location: [facility];  Service: Gynecology;  Laterality: N/A;   ROBOTIC ASSISTED TOTAL HYSTERECTOMY WITH BILATERAL SALPINGO OOPHERECTOMY N/A 07/03/2019    Procedure: XI ROBOTIC ASSISTED TOTAL HYSTERECTOMY WITH UNILATERAL SALPINGO OOPHORECTOMY;  Surgeon: Carver Fila, MD;  Location: [facility];  Service: Gynecology;  Laterality: N/A;   SALIVARY GLAND SURGERY  1990   VULVECTOMY N/A 01/18/2018   Procedure: PARTIAL VULVECTOMY;  Surgeon: De Blanch, MD;  Location: [facility];  Service: Gynecology;  Laterality: N/A;    Family History  Problem Relation Age of Onset   Asthma Father    COPD Father    Cancer Mother    Diabetes Brother    Hypertension Brother    Diabetes Brother    Breast cancer Sister    Colon cancer Maternal Uncle     Social History Social History   Tobacco Use   Smoking status: Former    Packs/day: 1.50    Years: 41.00    Additional pack years: 0.00    Total pack years: 61.50    Types: Cigarettes    Start date: 03/10/1975    Quit date: 05/2016    Years since quitting: 6.2   Smokeless tobacco: Never   Tobacco comments:    Quit March 2018 - Pack Year estimate~40 pack-years  Vaping Use   Vaping Use: Never used  Substance Use Topics   Alcohol use: No   Drug use: Not Currently    Types:  Marijuana    Comment: past history of cocaine  and alcohol  none in 20 years    Allergies  Allergen Reactions   Baclofen Other (See Comments)    rash   Bactrim [Sulfamethoxazole-Trimethoprim] Rash   Doxycycline Rash    Current Outpatient Medications  Medication Sig Dispense Refill   albuterol (VENTOLIN HFA) 108 (90 Base) MCG/ACT inhaler TAKE 2 PUFFS BY MOUTH EVERY 6 HOURS AS NEEDED FOR WHEEZE OR SHORTNESS OF BREATH 8.5 each 2   Ascorbic Acid (VITAMIN C) 1000 MG tablet Take 1,000 mg by mouth at bedtime.     Black Cohosh-Soy-Ginkgo-Magnol (ESTROVEN MOOD & MEMORY PO) Take 1 tablet by mouth daily.     Budeson-Glycopyrrol-Formoterol (BREZTRI AEROSPHERE) 160-9-4.8 MCG/ACT AERO Inhale 2 puffs into the lungs 2 (two) times daily. Rinse mouth after use to avoid thrush. 120 each 11   Cholecalciferol  (VITAMIN D3) 25 MCG (1000 UT) capsule Take 1,000 Units by mouth daily.     COLLAGEN PO Take 1 Scoop by mouth daily. Powder mixed into smoothie     diclofenac Sodium (VOLTAREN) 1 % GEL APPLY 2 GRAMS TO AFFECTED AREA 4 TIMES A DAY 300 g 1   dolutegravir (TIVICAY) 50 MG tablet Take 1 tablet (50 mg total) by mouth daily. 30 tablet 11   dolutegravir (TIVICAY) 50 MG tablet Take 1 tablet (50 mg total) by mouth daily. 30 tablet 5   emtricitabine-tenofovir AF (DESCOVY) 200-25 MG tablet Take 1 tablet by mouth daily. 30 tablet 5   estradiol (CLIMARA - DOSED IN MG/24 HR) 0.1 mg/24hr patch PLACE 1 PATCH (0.1 MG TOTAL) ONTO THE SKIN ONCE A WEEK. 4 patch 1   gabapentin (NEURONTIN) 300 MG capsule Take 1 capsule (300 mg total) by mouth 3 (three) times daily. 90 capsule 1   losartan (COZAAR) 25 MG tablet Take 1 tablet (25 mg total) by mouth at bedtime. 90 tablet 1   Melatonin 5 MG CAPS Take 1 capsule by mouth at bedtime.     Multiple Vitamin (MULTIVITAMIN+ PO) Take 1 capsule by mouth daily. Magnesium + zinc     rosuvastatin (CRESTOR) 10 MG tablet Take 1 tablet (10 mg total) by mouth daily. 30 tablet 11   traMADol (ULTRAM) 50 MG tablet Take 1 tablet (50 mg total) by mouth 3 (three) times daily as needed for moderate pain. 90 tablet 0   fluticasone (FLONASE) 50 MCG/ACT nasal spray Place 2 sprays into both nostrils daily. 16 g 6   No current facility-administered medications for this visit.    Review of Systems Review of Systems  Constitutional:  Positive for unexpected weight change (weight gain this year 15 lb).  Gastrointestinal:  Positive for constipation.  Genitourinary:  Negative for vaginal bleeding and vaginal discharge.  Musculoskeletal:  Positive for arthralgias and back pain.    Blood pressure 115/73, pulse (!) 50, height 5\' 6"  (1.676 m), weight 188 lb (85.3 kg).  Physical Exam Physical Exam Vitals and nursing note reviewed. Exam conducted with a chaperone present.  Constitutional:       Appearance: She is not ill-appearing.  HENT:     Head: Normocephalic and atraumatic.  Cardiovascular:     Rate and Rhythm: Normal rate.  Pulmonary:     Effort: Pulmonary effort is normal.  Genitourinary:    General: Normal vulva.     Exam position: Lithotomy position.     Vagina: Normal.     Uterus: Absent.        Comments: Hyperpigmented hyperkeratotic area right thigh  with excoriated area as described above, she plans to see dermatology for bx  Skin:    General: Skin is warm and dry.  Neurological:     Mental Status: She is alert.  Psychiatric:        Mood and Affect: Mood normal.        Behavior: Behavior normal.     Data Reviewed Op notes, pathology  Assessment Vulvar intraepithelial neoplasia (VIN) grade 3 - Plan: Cytology - PAP( [place])  HIV infection, unspecified symptom status (HCC)  Former cigarette smoker Lesion of right thigh needs biopsy  Plan Will f/u with PCP and dermatology Yearly pap for now Plans weight loss Yearly mammogram    Scheryl Darter 08/18/2022, 2:41 PM

## 2022-08-20 LAB — CYTOLOGY - PAP
Comment: NEGATIVE
Diagnosis: NEGATIVE
High risk HPV: NEGATIVE

## 2022-08-26 ENCOUNTER — Other Ambulatory Visit: Payer: Self-pay | Admitting: Student

## 2022-08-26 DIAGNOSIS — R058 Other specified cough: Secondary | ICD-10-CM

## 2022-08-31 ENCOUNTER — Ambulatory Visit (INDEPENDENT_AMBULATORY_CARE_PROVIDER_SITE_OTHER): Payer: 59 | Admitting: Family Medicine

## 2022-08-31 VITALS — BP 137/82 | HR 88 | Temp 99.0°F | Ht 66.0 in | Wt 181.2 lb

## 2022-08-31 DIAGNOSIS — J441 Chronic obstructive pulmonary disease with (acute) exacerbation: Secondary | ICD-10-CM

## 2022-08-31 DIAGNOSIS — R051 Acute cough: Secondary | ICD-10-CM | POA: Diagnosis not present

## 2022-08-31 MED ORDER — PREDNISONE 20 MG PO TABS: 40.0000 mg | ORAL_TABLET | Freq: Every day | ORAL | 0 refills | Status: AC

## 2022-08-31 MED ORDER — CEFDINIR 300 MG PO CAPS: 300.0000 mg | ORAL_CAPSULE | Freq: Two times a day (BID) | ORAL | 0 refills | Status: AC

## 2022-08-31 MED ORDER — AZITHROMYCIN 500 MG PO TABS: 500.0000 mg | ORAL_TABLET | Freq: Every day | ORAL | 0 refills | Status: DC

## 2022-08-31 NOTE — Progress Notes (Signed)
    SUBJECTIVE:   CHIEF COMPLAINT / HPI:   Not feeling well - Patient concerned about bronchitis or a pneumonia - Started about 1 weeks ago - Denies fever - Has been more tired, increased sputum production that has changed color - Is also coughing up grey/green phlegm  - Taking breztri twice daily  - Has albuterol and is using twice daily (typically doesn't need albuterol often)  PERTINENT  PMH / PSH: COPD/asthma, chronic bronchitis, HIV  OBJECTIVE:   BP 137/82   Pulse 88   Temp 99 F (37.2 C)   Ht 5\' 6"  (1.676 m)   Wt 181 lb 3.2 oz (82.2 kg)   SpO2 92%   BMI 29.25 kg/m   Gen: NAD, appears ill but not toxic-appearing CV: RRR, no m/r/g appreciated, no peripheral edema Pulm: diffuse wheezing and rhonchi, increased WOB but able to still speak in full sentences  ASSESSMENT/PLAN:   COPD exacerbation  Concern for pneumonia Patient presents with history and physical consistent with a mild/moderate COPD exacerbation. Patient has been treated for COPD exacerbation once in the last 12 months, but has clinical picture complicated by HIV (though viral load undetectable) and given the diffuse rhonchi, we will do CAP coverage and steroid treatment as well as obtain a chest x-ray to rule out abnormal infection. - Azithromycin 500mg  daily x3 days - Cefdinir 300mg  BID x7 days - Prednisone 40mg  daily x5 days - 2-view CXR - Continue inhalers as prescribed - Strict ER and return precautions discussed   Evelena Leyden, DO [facility]

## 2022-08-31 NOTE — Patient Instructions (Addendum)
I am going to treat you as a high risk COPD treatment with coverage for pneumonia.  I want you to go ahead and get an x-ray (location is [facility] imaging at [address]) to make sure there is nothing concerning that we are not covering for.  You will take the following medications: - Cefdinir 300 mg twice daily x 7 days - Azithromycin 500 mg once daily x 3 days - Prednisone 40 mg daily x 5 days   If you have acute worsening or difficulty with your breathing that is more concerning then please go to the ER.  If you are not having any symptom improvement after 3 days send please call the office for an appointment.

## 2022-09-01 ENCOUNTER — Other Ambulatory Visit (HOSPITAL_COMMUNITY): Payer: Self-pay

## 2022-09-01 ENCOUNTER — Other Ambulatory Visit: Payer: Self-pay | Admitting: Obstetrics

## 2022-09-01 DIAGNOSIS — N951 Menopausal and female climacteric states: Secondary | ICD-10-CM

## 2022-09-04 ENCOUNTER — Other Ambulatory Visit (HOSPITAL_COMMUNITY): Payer: Self-pay

## 2022-09-04 ENCOUNTER — Other Ambulatory Visit: Payer: Self-pay

## 2022-09-04 DIAGNOSIS — G8929 Other chronic pain: Secondary | ICD-10-CM

## 2022-09-04 NOTE — Telephone Encounter (Signed)
Patient calls nurse line requesting a prescription for cough medication.   She reports she has been continuously coughing. She reports she has been taking the antibiotic as prescribed and ALKA Seltzer Plus, however no cough relief.  She denies any worsening symptoms. No fevers or SOB.   She is also requesting a refill on Tramadol.   Patient advised to monitor symptoms.   Advised a clinic visit next week if no improvement.   Will forward to PCP.

## 2022-09-08 MED ORDER — TRAMADOL HCL 50 MG PO TABS: 50.0000 mg | ORAL_TABLET | Freq: Three times a day (TID) | ORAL | 0 refills | Status: DC | PRN

## 2022-09-08 NOTE — Telephone Encounter (Signed)
Patient calls nurse line checking the status of Tramadol prescription.

## 2022-09-26 ENCOUNTER — Other Ambulatory Visit: Payer: Self-pay | Admitting: Family Medicine

## 2022-09-29 ENCOUNTER — Other Ambulatory Visit (HOSPITAL_COMMUNITY): Payer: Self-pay

## 2022-10-06 ENCOUNTER — Other Ambulatory Visit: Payer: Self-pay

## 2022-10-10 ENCOUNTER — Other Ambulatory Visit: Payer: Self-pay | Admitting: Family Medicine

## 2022-10-10 DIAGNOSIS — G8929 Other chronic pain: Secondary | ICD-10-CM

## 2022-10-12 MED ORDER — TRAMADOL HCL 50 MG PO TABS: 50.0000 mg | ORAL_TABLET | Freq: Three times a day (TID) | ORAL | 0 refills | Status: DC | PRN

## 2022-10-12 NOTE — Telephone Encounter (Signed)
Patient returns call to nurse line regarding refill.   Talbot Grumbling, RN

## 2022-10-29 ENCOUNTER — Other Ambulatory Visit (HOSPITAL_COMMUNITY): Payer: Self-pay

## 2022-11-02 ENCOUNTER — Other Ambulatory Visit: Payer: Self-pay | Admitting: Family Medicine

## 2022-11-02 ENCOUNTER — Other Ambulatory Visit (HOSPITAL_COMMUNITY): Payer: Self-pay

## 2022-11-02 ENCOUNTER — Other Ambulatory Visit: Payer: Self-pay | Admitting: Internal Medicine

## 2022-11-02 DIAGNOSIS — Z21 Asymptomatic human immunodeficiency virus [HIV] infection status: Secondary | ICD-10-CM

## 2022-11-02 DIAGNOSIS — G8929 Other chronic pain: Secondary | ICD-10-CM

## 2022-11-03 ENCOUNTER — Encounter: Payer: Self-pay | Admitting: Family Medicine

## 2022-11-03 ENCOUNTER — Ambulatory Visit (INDEPENDENT_AMBULATORY_CARE_PROVIDER_SITE_OTHER): Payer: 59 | Admitting: Family Medicine

## 2022-11-03 VITALS — BP 127/71 | HR 62 | Ht 66.0 in | Wt 188.6 lb

## 2022-11-03 DIAGNOSIS — Z23 Encounter for immunization: Secondary | ICD-10-CM | POA: Diagnosis not present

## 2022-11-03 DIAGNOSIS — M7581 Other shoulder lesions, right shoulder: Secondary | ICD-10-CM | POA: Diagnosis not present

## 2022-11-03 DIAGNOSIS — M7521 Bicipital tendinitis, right shoulder: Secondary | ICD-10-CM | POA: Diagnosis not present

## 2022-11-03 DIAGNOSIS — G8929 Other chronic pain: Secondary | ICD-10-CM | POA: Diagnosis not present

## 2022-11-03 DIAGNOSIS — M5441 Lumbago with sciatica, right side: Secondary | ICD-10-CM | POA: Diagnosis not present

## 2022-11-03 MED ORDER — TRAMADOL HCL 50 MG PO TABS: 50.0000 mg | ORAL_TABLET | Freq: Three times a day (TID) | ORAL | 0 refills | Status: DC | PRN

## 2022-11-03 NOTE — Assessment & Plan Note (Signed)
Maintaining quality of life and functionality on tramadol 50 mg 3 times daily as needed.  Appropriate to continue at this time.

## 2022-11-03 NOTE — Assessment & Plan Note (Addendum)
Exam and ultrasound findings consistent with biceps tendinitis.  Will refer to physical therapy as above.  Consider future sports medicine referral.

## 2022-11-03 NOTE — Assessment & Plan Note (Addendum)
Suspect supraspinatus tendinosis based on exam and ultrasound findings.  Given weakness in comparison to left side it is possible that she has partial tear as well.  Likely also component of osteoarthritis, will repeat shoulder x-ray for progression.  Previous reviewed and without bony deformities at that time.  Likely also component of subacromial impingement.  Discussed treatment options with patient.  She elected to move forward with subacromial steroid injection next week in addition to concurrent physical therapy.

## 2022-11-03 NOTE — Patient Instructions (Addendum)
It was great to see you! Thank you for allowing me to participate in your care!  Our plans for today:  - I have scheduled you for next week for your shoulder injection. - You should be called to scheduled physical therapy. - Will check your A1c at your next visit. - Please go to [facility] to get your shoulder x-ray done   Please arrive 15 minutes PRIOR to your next scheduled appointment time! If you do not, this affects OTHER patients' care.  Take care and seek immediate care sooner if you develop any concerns.   Celine Mans, MD, PGY-2 [facility] 3:04 PM 11/03/2022  [facility]

## 2022-11-03 NOTE — Progress Notes (Signed)
    SUBJECTIVE:   CHIEF COMPLAINT / HPI: chronic pain f/u  On tramadol for chronic back pain. Taking up 3 50mg  pills per day. Still enabling function. No current constipation. Having regular bowel movements.   Has had rotator cuff tendinitis. Previously has had cortisone shots. Has sharp pain with movement, radiates through most of shoulder.  PERTINENT  PMH / PSH: HTN, COPD, Asthma, Chronic Low Back Pain, HIV  OBJECTIVE:   BP 127/71   Pulse 62   Ht 5\' 6"  (1.676 m)   Wt 188 lb 9.6 oz (85.5 kg)   SpO2 100%   BMI 30.44 kg/m   General: NAD, well appearing Neuro: A&O Respiratory: normal WOB on RA Extremities: Moving all 4 extremities equally Right Shoulder: -No obvious swelling, erythema, deformity -Tenderness to palpation along AC joint, biceps tendon, deltoid -Full range of motion, audible crepitus with shoulder abduction -Decreased shoulder abduction and flexion strength, positive empty can test, positive Hawkins test, positive crossarm test, positive speeds test, positive Neer's and status  Right shoulder POCUS: Hypoechoic region surrounding biceps tendon, small hypoechoic region along insertion of supraspinatus  ASSESSMENT/PLAN:   Right rotator cuff tendinitis Assessment & Plan: Suspect supraspinatus tendinosis based on exam and ultrasound findings.  Given weakness in comparison to left side it is possible that she has partial tear as well.  Likely also component of osteoarthritis, will repeat shoulder x-ray for progression.  Previous reviewed and without bony deformities at that time.  Likely also component of subacromial impingement.  Discussed treatment options with patient.  She elected to move forward with subacromial steroid injection next week in addition to concurrent physical therapy.  Orders: -     Ambulatory referral to Physical Therapy -     DG Shoulder Right; Future  Chronic bilateral low back pain with right-sided sciatica Assessment & Plan: Maintaining  quality of life and functionality on tramadol 50 mg 3 times daily as needed.  Appropriate to continue at this time.  Orders: -     traMADol HCl; Take 1 tablet (50 mg total) by mouth 3 (three) times daily as needed for moderate pain. Please make appointment before next refill.  Dispense: 90 tablet; Refill: 0  Biceps tendinitis of right shoulder Assessment & Plan: Exam and ultrasound findings consistent with biceps tendinitis.  Will refer to physical therapy as above.  Consider future sports medicine referral.   Other orders -     Tdap vaccine greater than or equal to 7yo IM   Return in about 1 week (around 11/10/2022).  Celine Mans, MD [facility]

## 2022-11-06 ENCOUNTER — Other Ambulatory Visit: Payer: Self-pay

## 2022-11-06 ENCOUNTER — Ambulatory Visit: Payer: 59

## 2022-11-06 ENCOUNTER — Other Ambulatory Visit (HOSPITAL_COMMUNITY): Payer: Self-pay

## 2022-11-06 VITALS — Ht 66.0 in | Wt 188.0 lb

## 2022-11-06 DIAGNOSIS — Z0000 Encounter for general adult medical examination without abnormal findings: Secondary | ICD-10-CM

## 2022-11-06 MED ORDER — DESCOVY 200-25 MG PO TABS
1.0000 | ORAL_TABLET | Freq: Every day | ORAL | 0 refills | Status: DC
  Filled 2022-11-06: qty 30, 30d supply, fill #0

## 2022-11-06 MED ORDER — TIVICAY 50 MG PO TABS
50.0000 mg | ORAL_TABLET | Freq: Every day | ORAL | 0 refills | Status: DC
  Filled 2022-11-06: qty 30, 30d supply, fill #0

## 2022-11-06 NOTE — Patient Instructions (Addendum)
Courtney Hamilton , Thank you for taking time to come for your Medicare Wellness Visit. I appreciate your ongoing commitment to your health goals. Please review the following plan we discussed and let me know if I can assist you in the future.   Referrals/Orders/Follow-Ups/Clinician Recommendations: Aim for 30 minutes of exercise or brisk walking, 6-8 glasses of water, and 5 servings of fruits and vegetables each day.  This is a list of the screening recommended for you and due dates:  Health Maintenance  Topic Date Due   Zoster (Shingles) Vaccine (1 of 2) Never done   COVID-19 Vaccine (3 - Pfizer risk series) 07/18/2019   Flu Shot  10/08/2022   Screening for Lung Cancer  07/17/2023   Medicare Annual Wellness Visit  11/06/2023   Mammogram  05/06/2024   Pap Smear  08/17/2025   Colon Cancer Screening  08/27/2032   DTaP/Tdap/Td vaccine (3 - Td or Tdap) 11/02/2032   Hepatitis C Screening  Completed   HIV Screening  Completed   HPV Vaccine  Aged Out    Advanced directives: (ACP Link)Information on Advanced Care Planning can be found at [facility] Advance Health Care Directives Advance Health Care Directives ([email or web address]   Next Medicare Annual Wellness Visit scheduled for next year: Yes

## 2022-11-06 NOTE — Progress Notes (Signed)
Subjective:   Courtney Hamilton is a 62 y.o. female who presents for an Initial Medicare Annual Wellness Visit.  Visit Complete: Virtual  I connected with  Courtney Hamilton on 11/06/22 by a audio enabled telemedicine application and verified that I am speaking with the correct person using two identifiers.  Patient Location: Home  Provider Location: Home Office  I discussed the limitations of evaluation and management by telemedicine. The patient expressed understanding and agreed to proceed.  Vital Signs: Because this visit was a virtual/telehealth visit, some criteria may be missing or patient reported. Any vitals not documented were not able to be obtained and vitals that have been documented are patient reported.   Review of Systems     Cardiac Risk Factors include: dyslipidemia;hypertension     Objective:    Today's Vitals   11/06/22 1327  Weight: 188 lb (85.3 kg)  Height: 5\' 6"  (1.676 m)   Body mass index is 30.34 kg/m.     11/06/2022   10:01 PM 11/03/2022    2:15 PM 08/31/2022    2:32 PM 08/06/2022    1:39 PM 06/30/2022    2:39 PM 05/14/2022   10:32 AM 04/29/2022    1:10 PM  Advanced Directives  Does Patient Have a Medical Advance Directive? No No No No No No No  Would patient like information on creating a medical advance directive? Yes (MAU/Ambulatory/Procedural Areas - Information given)  No - Patient declined No - Patient declined No - Patient declined No - Patient declined No - Patient declined    Current Medications (verified) Outpatient Encounter Medications as of 11/06/2022  Medication Sig   albuterol (VENTOLIN HFA) 108 (90 Base) MCG/ACT inhaler TAKE 2 PUFFS BY MOUTH EVERY 6 HOURS AS NEEDED FOR WHEEZE OR SHORTNESS OF BREATH   Ascorbic Acid (VITAMIN C) 1000 MG tablet Take 1,000 mg by mouth at bedtime.   azithromycin (ZITHROMAX) 500 MG tablet Take 1 tablet (500 mg total) by mouth daily.   Black Cohosh-Soy-Ginkgo-Magnol (ESTROVEN MOOD & MEMORY PO) Take 1 tablet by  mouth daily.   Budeson-Glycopyrrol-Formoterol (BREZTRI AEROSPHERE) 160-9-4.8 MCG/ACT AERO Inhale 2 puffs into the lungs 2 (two) times daily. Rinse mouth after use to avoid thrush.   Cholecalciferol (VITAMIN D3) 25 MCG (1000 UT) capsule Take 1,000 Units by mouth daily.   COLLAGEN PO Take 1 Scoop by mouth daily. Powder mixed into smoothie   diclofenac Sodium (VOLTAREN) 1 % GEL APPLY 2 GRAMS TO AFFECTED AREA 4 TIMES A DAY   estradiol (CLIMARA - DOSED IN MG/24 HR) 0.1 mg/24hr patch PLACE 1 PATCH (0.1 MG TOTAL) ONTO THE SKIN ONCE A WEEK.   fluticasone (FLONASE) 50 MCG/ACT nasal spray Place 2 sprays into both nostrils daily.   gabapentin (NEURONTIN) 300 MG capsule TAKE 1 CAPSULE BY MOUTH THREE TIMES A DAY   losartan (COZAAR) 25 MG tablet Take 1 tablet (25 mg total) by mouth at bedtime.   Melatonin 5 MG CAPS Take 1 capsule by mouth at bedtime.   Multiple Vitamin (MULTIVITAMIN+ PO) Take 1 capsule by mouth daily. Magnesium + zinc   rosuvastatin (CRESTOR) 10 MG tablet Take 1 tablet (10 mg total) by mouth daily.   traMADol (ULTRAM) 50 MG tablet Take 1 tablet (50 mg total) by mouth 3 (three) times daily as needed for moderate pain. Please make appointment before next refill.   [DISCONTINUED] dolutegravir (TIVICAY) 50 MG tablet Take 1 tablet (50 mg total) by mouth daily.   [DISCONTINUED] dolutegravir (TIVICAY) 50 MG tablet Take 1  tablet (50 mg total) by mouth daily.   [DISCONTINUED] emtricitabine-tenofovir AF (DESCOVY) 200-25 MG tablet Take 1 tablet by mouth daily.   No facility-administered encounter medications on file as of 11/06/2022.    Allergies (verified) Baclofen, Bactrim [sulfamethoxazole-trimethoprim], and Doxycycline   History: Past Medical History:  Diagnosis Date   Arthritis    right hip   Bronchitis last time  sept/oct 2021   Flu-like symptoms 05/25/2016   Fungal infection of skin of abdomen 06/16/2017   Hepatitis 1980's   NON A NON B tx for 1980's resolved   Herniated disc  10/07/2011   lower back   History of chronic kidney disease 1980's   resolved   HIV (human immunodeficiency virus infection) (HCC) 1985   Hypertension    Lower abdominal pain 10/08/2014   Lower GI bleed 05/10/2012   pt denies   Lower leg edema 12/10/2011   Neuropathy    resolved   Osteoporosis    Panic attacks 12/22/2013   Recurrent UTI 08/06/2011   Ruptured lumbar disc    Sciatica    right side   Substance abuse (HCC)    past hsitory  clean more than 20 years    TB (pulmonary tuberculosis) 1993   exposure, treated    Thickened endometrium    VIN III (vulvar intraepithelial neoplasia III)    Past Surgical History:  Procedure Laterality Date   APPENDECTOMY  1980's   BREAST EXCISIONAL BIOPSY Left 2001 per pt   cyst removed    CHOLECYSTECTOMY  1980's   CO2 LASER APPLICATION N/A 02/05/2020   Procedure: CO2 LASER APPLICATION OF VULVA (PERI-CLITORAL AND PERI-ANAL);  Surgeon: Carver Fila, MD;  Location: [facility];  Service: Gynecology;  Laterality: N/A;   ECTOPIC PREGNANCY SURGERY  1985   injections to lower back  09/2017   LAPAROSCOPY N/A 07/03/2019   Procedure: LAPAROSCOPY DIAGNOSTIC; LYSIS OF ADHESIONS;  Surgeon: Carver Fila, MD;  Location: [facility];  Service: Gynecology;  Laterality: N/A;   ROBOTIC ASSISTED TOTAL HYSTERECTOMY WITH BILATERAL SALPINGO OOPHERECTOMY N/A 07/03/2019   Procedure: XI ROBOTIC ASSISTED TOTAL HYSTERECTOMY WITH UNILATERAL SALPINGO OOPHORECTOMY;  Surgeon: Carver Fila, MD;  Location: [facility];  Service: Gynecology;  Laterality: N/A;   SALIVARY GLAND SURGERY  1990   VULVECTOMY N/A 01/18/2018   Procedure: PARTIAL VULVECTOMY;  Surgeon: De Blanch, MD;  Location: [facility];  Service: Gynecology;  Laterality: N/A;   Family History  Problem Relation Age of Onset   Asthma Father    COPD Father    Cancer Mother    Diabetes Brother    Hypertension Brother    Diabetes Brother    Breast cancer  Sister    Colon cancer Maternal Uncle    Social History   Socioeconomic History   Marital status: Married    Spouse name: Not on file   Number of children: Not on file   Years of education: Not on file   Highest education level: Associate degree: academic program  Occupational History   Not on file  Tobacco Use   Smoking status: Former    Current packs/day: 0.00    Average packs/day: 1.5 packs/day for 41.2 years (61.7 ttl pk-yrs)    Types: Cigarettes    Start date: 03/10/1975    Quit date: 05/2016    Years since quitting: 6.5   Smokeless tobacco: Never   Tobacco comments:    Quit March 2018 - Pack Year estimate~40 pack-years  Vaping Use  Vaping status: Never Used  Substance and Sexual Activity   Alcohol use: No   Drug use: Not Currently    Types: Marijuana    Comment: past history of cocaine  and alcohol  none in 20 years   Sexual activity: Yes    Partners: Male    Comment: accepted condoms 02/2021  Other Topics Concern   Not on file  Social History Narrative   Not on file   Social Determinants of Health   Financial Resource Strain: Low Risk  (11/06/2022)   Overall Financial Resource Strain (CARDIA)    Difficulty of Paying Living Expenses: Not very hard  Food Insecurity: Food Insecurity Present (11/06/2022)   Hunger Vital Sign    Worried About Running Out of Food in the Last Year: Sometimes true    Ran Out of Food in the Last Year: Sometimes true  Transportation Needs: No Transportation Needs (11/06/2022)   PRAPARE - Administrator, Civil Service (Medical): No    Lack of Transportation (Non-Medical): No  Physical Activity: Insufficiently Active (11/06/2022)   Exercise Vital Sign    Days of Exercise per Week: 3 days    Minutes of Exercise per Session: 20 min  Stress: No Stress Concern Present (11/06/2022)   [employer] of Occupational Health - Occupational Stress Questionnaire    Feeling of Stress : Only a little  Social Connections: Moderately  Integrated (11/06/2022)   Social Connection and Isolation Panel [NHANES]    Frequency of Communication with Friends and Family: Three times a week    Frequency of Social Gatherings with Friends and Family: Three times a week    Attends Religious Services: More than 4 times per year    Active Member of Clubs or Organizations: No    Attends Banker Meetings: Never    Marital Status: Married    Tobacco Counseling Counseling given: Not Answered Tobacco comments: Quit March 2018 - Pack Year estimate~40 pack-years   Clinical Intake:  Pre-visit preparation completed: Yes  Pain : No/denies pain     Diabetes: No  How often do you need to have someone help you when you read instructions, pamphlets, or other written materials from your doctor or pharmacy?: 1 - Never  Interpreter Needed?: No  Information entered by :: Kandis Fantasia LPN   Activities of Daily Living    11/06/2022   10:00 PM  In your present state of health, do you have any difficulty performing the following activities:  Hearing? 0  Vision? 0  Difficulty concentrating or making decisions? 0  Walking or climbing stairs? 0  Dressing or bathing? 0  Doing errands, shopping? 0  Preparing Food and eating ? N  Using the Toilet? N  In the past six months, have you accidently leaked urine? N  Do you have problems with loss of bowel control? N  Managing your Medications? N  Managing your Finances? N  Housekeeping or managing your Housekeeping? N    Patient Care Team: Celine Mans, MD as PCP - General (Family Medicine) Judyann Munson, MD as Consulting Physician (Infectious Diseases)  Indicate any recent Medical Services you may have received from other than [facility] providers in the past year (date may be approximate).     Assessment:   This is a routine wellness examination for Courtney Hamilton.  Hearing/Vision screen Hearing Screening - Comments:: Denies hearing difficulties   Vision Screening -  Comments:: Wears rx glasses - up to date with routine eye exams with [facility]  Dietary issues and exercise activities discussed:     Goals Addressed             This Visit's Progress    Remain active and independent        Depression Screen    11/06/2022    9:55 PM 11/03/2022    2:14 PM 08/31/2022    2:30 PM 08/06/2022    1:39 PM 06/30/2022    2:40 PM 05/14/2022   10:33 AM 04/21/2022    1:31 PM  PHQ 2/9 Scores  PHQ - 2 Score 2 2 1 1 1  0 1  PHQ- 9 Score 7 7 4 5 7 3 5     Fall Risk    11/06/2022   10:00 PM 07/15/2021    2:40 PM 02/18/2021    3:48 PM 08/08/2020    4:07 PM 12/26/2019    2:07 PM  Fall Risk   Falls in the past year? 0 0 0 0 0  Number falls in past yr: 0 0     Injury with Fall? 0 0     Risk for fall due to : No Fall Risks No Fall Risks No Fall Risks No Fall Risks   Follow up Falls prevention discussed;Education provided;Falls evaluation completed Falls evaluation completed Falls evaluation completed Falls evaluation completed     MEDICARE RISK AT HOME: Medicare Risk at Home Any stairs in or around the home?: No If so, are there any without handrails?: No Home free of loose throw rugs in walkways, pet beds, electrical cords, etc?: Yes Adequate lighting in your home to reduce risk of falls?: Yes Life alert?: No Use of a cane, walker or w/c?: No Grab bars in the bathroom?: Yes Shower chair or bench in shower?: No Elevated toilet seat or a handicapped toilet?: No  TIMED UP AND GO:  Was the test performed? No    Cognitive Function:        11/06/2022   10:01 PM  6CIT Screen  What Year? 0 points  What month? 0 points  What time? 0 points  Count back from 20 0 points  Months in reverse 0 points  Repeat phrase 0 points  Total Score 0 points    Immunizations Immunization History  Administered Date(s) Administered   Hepatitis A 10/19/2011, 06/07/2012   Hepatitis B 10/19/2011, 12/02/2011, 06/07/2012   Influenza Split 12/22/2011    Influenza,inj,Quad PF,6+ Mos 01/17/2014, 01/03/2015, 12/27/2015, 01/05/2018, 12/03/2018, 12/26/2019, 05/14/2022   Influenza-Unspecified 11/21/2016   PFIZER(Purple Top)SARS-COV-2 Vaccination 05/20/2019, 06/20/2019   PPD Test 10/01/2011   Pneumococcal Conjugate-13 04/04/2018   Pneumococcal Polysaccharide-23 11/07/2009, 01/22/2016   Tdap 05/23/2007, 11/03/2022    TDAP status: Up to date  Flu Vaccine status: Due, Education has been provided regarding the importance of this vaccine. Advised may receive this vaccine at local pharmacy or Health Dept. Aware to provide a copy of the vaccination record if obtained from local pharmacy or Health Dept. Verbalized acceptance and understanding.  Pneumococcal vaccine status: Up to date  Covid-19 vaccine status: Information provided on how to obtain vaccines.   Qualifies for Shingles Vaccine? Yes   Zostavax completed No   Shingrix Completed?: No.    Education has been provided regarding the importance of this vaccine. Patient has been advised to call insurance company to determine out of pocket expense if they have not yet received this vaccine. Advised may also receive vaccine at local pharmacy or Health Dept. Verbalized acceptance and understanding.  Screening Tests Health Maintenance  Topic Date Due  Zoster Vaccines- Shingrix (1 of 2) Never done   COVID-19 Vaccine (3 - Pfizer risk series) 07/18/2019   INFLUENZA VACCINE  10/08/2022   Lung Cancer Screening  07/17/2023   Medicare Annual Wellness (AWV)  11/06/2023   MAMMOGRAM  05/06/2024   PAP SMEAR-Modifier  08/17/2025   Colonoscopy  08/27/2032   DTaP/Tdap/Td (3 - Td or Tdap) 11/02/2032   Hepatitis C Screening  Completed   HIV Screening  Completed   HPV VACCINES  Aged Out    Health Maintenance  Health Maintenance Due  Topic Date Due   Zoster Vaccines- Shingrix (1 of 2) Never done   COVID-19 Vaccine (3 - Pfizer risk series) 07/18/2019   INFLUENZA VACCINE  10/08/2022    Colorectal cancer  screening: Type of screening: Colonoscopy. Completed 06/24/20. Repeat every 5 years  Mammogram status: Completed 05/06/22. Repeat every year  Lung Cancer Screening: (Low Dose CT Chest recommended if Age 79-80 years, 20 pack-year currently smoking OR have quit w/in 15years.) does qualify.   Lung Cancer Screening Referral: last 07/17/22  Additional Screening:  Hepatitis C Screening: does qualify; Completed 10/01/11  Vision Screening: Recommended annual ophthalmology exams for early detection of glaucoma and other disorders of the eye. Is the patient up to date with their annual eye exam?  Yes  Who is the provider or what is the name of the office in which the patient attends annual eye exams? [facility] If pt is not established with a provider, would they like to be referred to a provider to establish care? No .   Dental Screening: Recommended annual dental exams for proper oral hygiene  Community Resource Referral / Chronic Care Management: CRR required this visit?  No   CCM required this visit?  No     Plan:     I have personally reviewed and noted the following in the patient's chart:   Medical and social history Use of alcohol, tobacco or illicit drugs  Current medications and supplements including opioid prescriptions. Patient is not currently taking opioid prescriptions. Functional ability and status Nutritional status Physical activity Advanced directives List of other physicians Hospitalizations, surgeries, and ER visits in previous 12 months Vitals Screenings to include cognitive, depression, and falls Referrals and appointments  In addition, I have reviewed and discussed with patient certain preventive protocols, quality metrics, and best practice recommendations. A written personalized care plan for preventive services as well as general preventive health recommendations were provided to patient.     Kandis Fantasia [place], [place]   04/08/8655   After Visit  Summary: (MyChart) Due to this being a telephonic visit, the after visit summary with patients personalized plan was offered to patient via MyChart   Nurse Notes: Patient would like to discuss wt management at next office visit

## 2022-11-09 ENCOUNTER — Encounter: Payer: Self-pay | Admitting: Family Medicine

## 2022-11-09 NOTE — Progress Notes (Unsigned)
    SUBJECTIVE:   CHIEF COMPLAINT / HPI: shoulder injection  Here for right subacromial shoulder injection. See my previous visit for HPI. Pain continues to be 10/10 per patient.   PERTINENT  PMH / PSH: Right rotator cuff tendinitis  OBJECTIVE:   BP 135/78   Pulse (!) 53   Ht 5\' 6"  (1.676 m)   Wt 186 lb 6 oz (84.5 kg)   SpO2 100%   BMI 30.08 kg/m   General: NAD, well appearing Neuro: A&O Respiratory: normal WOB on RA Extremities: Moving all 4 extremities equally  Right Shoulder: no obvious bony deformity, swelling, erythema, TTP along AC joint, biceps tendon. Decreased should abduction and flexion strength, positive empty can test, positive hawkins test   Right shoulder XR - Decreased glenohumeral joint space, sclerosis of glenoid fossa   ASSESSMENT/PLAN:   Right rotator cuff tendinitis Assessment & Plan: CSI performed today as below. Rediscussed plan for concurrent physical therapy over the next 2 months. Continue prn Tylenol. Patient confirmed she has PT scheduled. Follow-up 1-2 months depending on pain relief. Consider referral to Sports Medicine.    Return in about 3 months (around 02/09/2023).  PROCEDURE: INJECTION: Patient was given informed consent, signed copy in the chart. Appropriate time out was taken. Area prepped and draped in usual sterile fashion. Ethyl chloride was  used for local anesthesia. A 25 gauge 1 1/2 inch needle was used. 1 cc of methylprednisolone 40 mg/ml plus 4 cc of 1% lidocaine without epinephrine was injected into the right subacromial space using a(n) posterior lateral approach.   The patient tolerated the procedure well. There were no complications. Post procedure instructions were given.  Celine Mans, MD [facility]

## 2022-11-10 ENCOUNTER — Ambulatory Visit
Admission: RE | Admit: 2022-11-10 | Discharge: 2022-11-10 | Disposition: A | Discharge status: Home / Self Care | Payer: 59 | Source: Ambulatory Visit | Attending: Family Medicine | Admitting: Family Medicine

## 2022-11-10 ENCOUNTER — Ambulatory Visit (INDEPENDENT_AMBULATORY_CARE_PROVIDER_SITE_OTHER): Payer: 59 | Admitting: Family Medicine

## 2022-11-10 ENCOUNTER — Encounter: Payer: Self-pay | Admitting: Family Medicine

## 2022-11-10 VITALS — BP 135/78 | HR 53 | Ht 66.0 in | Wt 186.4 lb

## 2022-11-10 DIAGNOSIS — M7581 Other shoulder lesions, right shoulder: Secondary | ICD-10-CM

## 2022-11-10 DIAGNOSIS — M25511 Pain in right shoulder: Secondary | ICD-10-CM | POA: Diagnosis not present

## 2022-11-10 MED ORDER — METHYLPREDNISOLONE ACETATE 40 MG/ML IJ SUSP
40.0000 mg | Freq: Once | INTRAMUSCULAR | Status: AC
  Administered 2022-11-10: 40 mg via INTRAMUSCULAR

## 2022-11-10 MED ORDER — METHYLPREDNISOLONE ACETATE 40 MG/ML IJ SUSP: 40.0000 mg | Freq: Once | INTRAMUSCULAR | Status: DC

## 2022-11-10 NOTE — Patient Instructions (Signed)
It was great to see you! Thank you for allowing me to participate in your care!  Our plans for today:  - Today you received an injection with corticosteroid. This injection is usually done in response to pain and inflammation. There is some "numbing" medicine also in the shot so the injected area may be numb and feel really good for the next couple of hours. The numbing medicine usually wears off in 2-3 hours though, and then your pain level will be right back where it was before the injection.   The actually benefit from the steroid injection is usually noticed in 2-7 days. You may actually experience a small (as in 10%) INCREASE in pain in the first 24 hours---that is common.   Things to watch out for that you should contact us or a health care provider urgently would include: 1. Unusual (as in more than 10%) increase in pain 2. New fever > 101.5 3. New swelling or redness of the injected area.  4. Streaking of red lines around the area injected.   Please arrive 15 minutes PRIOR to your next scheduled appointment time! If you do not, this affects OTHER patients' care.  Take care and seek immediate care sooner if you develop any concerns.   Celine Mans, MD, PGY-2 [facility] 11:49 AM 11/10/2022  [facility]

## 2022-11-10 NOTE — Assessment & Plan Note (Addendum)
CSI performed today as below. Rediscussed plan for concurrent physical therapy over the next 2 months. Continue prn Tylenol. Patient confirmed she has PT scheduled. Follow-up 1-2 months depending on pain relief. Consider referral to Sports Medicine.

## 2022-11-26 ENCOUNTER — Other Ambulatory Visit: Payer: Self-pay

## 2022-11-26 ENCOUNTER — Other Ambulatory Visit (HOSPITAL_COMMUNITY): Payer: Self-pay

## 2022-11-26 ENCOUNTER — Other Ambulatory Visit: Payer: Self-pay | Admitting: Internal Medicine

## 2022-11-26 DIAGNOSIS — Z21 Asymptomatic human immunodeficiency virus [HIV] infection status: Secondary | ICD-10-CM

## 2022-11-26 MED ORDER — TIVICAY 50 MG PO TABS
50.0000 mg | ORAL_TABLET | Freq: Every day | ORAL | 5 refills | Status: DC
  Filled 2022-11-26: qty 30, 30d supply, fill #0
  Filled 2022-12-28: qty 30, 30d supply, fill #1
  Filled 2023-01-26: qty 30, 30d supply, fill #2
  Filled 2023-02-24: qty 30, 30d supply, fill #3
  Filled 2023-03-31: qty 30, 30d supply, fill #4
  Filled 2023-04-30: qty 30, 30d supply, fill #5

## 2022-11-26 MED ORDER — DESCOVY 200-25 MG PO TABS
1.0000 | ORAL_TABLET | Freq: Every day | ORAL | 5 refills | Status: DC
  Filled 2022-11-26: qty 30, 30d supply, fill #0
  Filled 2022-12-28: qty 30, 30d supply, fill #1
  Filled 2023-01-26: qty 30, 30d supply, fill #2
  Filled 2023-02-24: qty 30, 30d supply, fill #3
  Filled 2023-03-31: qty 30, 30d supply, fill #4
  Filled 2023-04-30: qty 30, 30d supply, fill #5

## 2022-12-03 ENCOUNTER — Other Ambulatory Visit (HOSPITAL_COMMUNITY): Payer: Self-pay

## 2022-12-10 ENCOUNTER — Emergency Department (HOSPITAL_COMMUNITY)
Admission: EM | Admit: 2022-12-10 | Discharge: 2022-12-10 | Disposition: A | Discharge status: Home / Self Care | Payer: 59 | Attending: Emergency Medicine | Admitting: Emergency Medicine

## 2022-12-10 ENCOUNTER — Ambulatory Visit (HOSPITAL_COMMUNITY)
Admission: RE | Admit: 2022-12-10 | Discharge: 2022-12-10 | Disposition: A | Discharge status: Home / Self Care | Payer: 59 | Source: Ambulatory Visit | Attending: Family Medicine | Admitting: Family Medicine

## 2022-12-10 ENCOUNTER — Other Ambulatory Visit: Payer: Self-pay

## 2022-12-10 ENCOUNTER — Other Ambulatory Visit: Payer: Self-pay | Admitting: Family Medicine

## 2022-12-10 ENCOUNTER — Ambulatory Visit: Payer: 59 | Admitting: Family Medicine

## 2022-12-10 ENCOUNTER — Emergency Department (HOSPITAL_COMMUNITY): Payer: 59

## 2022-12-10 ENCOUNTER — Encounter (HOSPITAL_COMMUNITY): Payer: Self-pay

## 2022-12-10 ENCOUNTER — Encounter: Payer: Self-pay | Admitting: Family Medicine

## 2022-12-10 VITALS — BP 163/87 | HR 44 | Temp 98.1°F | Ht 66.0 in | Wt 189.2 lb

## 2022-12-10 DIAGNOSIS — R5383 Other fatigue: Secondary | ICD-10-CM | POA: Insufficient documentation

## 2022-12-10 DIAGNOSIS — M5441 Lumbago with sciatica, right side: Secondary | ICD-10-CM | POA: Diagnosis not present

## 2022-12-10 DIAGNOSIS — R6889 Other general symptoms and signs: Secondary | ICD-10-CM | POA: Diagnosis not present

## 2022-12-10 DIAGNOSIS — G8929 Other chronic pain: Secondary | ICD-10-CM

## 2022-12-10 DIAGNOSIS — R079 Chest pain, unspecified: Secondary | ICD-10-CM | POA: Diagnosis not present

## 2022-12-10 DIAGNOSIS — I10 Essential (primary) hypertension: Secondary | ICD-10-CM

## 2022-12-10 DIAGNOSIS — R404 Transient alteration of awareness: Secondary | ICD-10-CM | POA: Diagnosis not present

## 2022-12-10 DIAGNOSIS — R001 Bradycardia, unspecified: Secondary | ICD-10-CM

## 2022-12-10 DIAGNOSIS — R531 Weakness: Secondary | ICD-10-CM | POA: Diagnosis not present

## 2022-12-10 DIAGNOSIS — Z1152 Encounter for screening for COVID-19: Secondary | ICD-10-CM | POA: Insufficient documentation

## 2022-12-10 DIAGNOSIS — Z743 Need for continuous supervision: Secondary | ICD-10-CM | POA: Diagnosis not present

## 2022-12-10 DIAGNOSIS — Z23 Encounter for immunization: Secondary | ICD-10-CM | POA: Diagnosis not present

## 2022-12-10 DIAGNOSIS — I499 Cardiac arrhythmia, unspecified: Secondary | ICD-10-CM | POA: Diagnosis not present

## 2022-12-10 LAB — CBC WITH DIFFERENTIAL/PLATELET
Abs Immature Granulocytes: 0.02 10*3/uL (ref 0.00–0.07)
Basophils Absolute: 0.1 10*3/uL (ref 0.0–0.1)
Basophils Relative: 1 %
Eosinophils Absolute: 0.4 10*3/uL (ref 0.0–0.5)
Eosinophils Relative: 5 %
HCT: 41.5 % (ref 36.0–46.0)
Hemoglobin: 13.1 g/dL (ref 12.0–15.0)
Immature Granulocytes: 0 %
Lymphocytes Relative: 30 %
Lymphs Abs: 2.1 10*3/uL (ref 0.7–4.0)
MCH: 28.5 pg (ref 26.0–34.0)
MCHC: 31.6 g/dL (ref 30.0–36.0)
MCV: 90.2 fL (ref 80.0–100.0)
Monocytes Absolute: 0.6 10*3/uL (ref 0.1–1.0)
Monocytes Relative: 9 %
Neutro Abs: 3.9 10*3/uL (ref 1.7–7.7)
Neutrophils Relative %: 55 %
Platelets: 237 10*3/uL (ref 150–400)
RBC: 4.6 MIL/uL (ref 3.87–5.11)
RDW: 13.2 % (ref 11.5–15.5)
WBC: 7.2 10*3/uL (ref 4.0–10.5)
nRBC: 0 % (ref 0.0–0.2)

## 2022-12-10 LAB — RESP PANEL BY RT-PCR (RSV, FLU A&B, COVID)  RVPGX2
Influenza A by PCR: NEGATIVE
Influenza B by PCR: NEGATIVE
Resp Syncytial Virus by PCR: NEGATIVE
SARS Coronavirus 2 by RT PCR: NEGATIVE

## 2022-12-10 LAB — COMPREHENSIVE METABOLIC PANEL
ALT: 11 U/L (ref 0–44)
AST: 19 U/L (ref 15–41)
Albumin: 3.6 g/dL (ref 3.5–5.0)
Alkaline Phosphatase: 41 U/L (ref 38–126)
Anion gap: 13 (ref 5–15)
BUN: 7 mg/dL — ABNORMAL LOW (ref 8–23)
CO2: 25 mmol/L (ref 22–32)
Calcium: 9.3 mg/dL (ref 8.9–10.3)
Chloride: 103 mmol/L (ref 98–111)
Creatinine, Ser: 0.9 mg/dL (ref 0.44–1.00)
GFR, Estimated: >60 mL/min (ref >60)
Glucose, Bld: 98 mg/dL (ref 70–99)
Potassium: 4.2 mmol/L (ref 3.5–5.1)
Sodium: 141 mmol/L (ref 135–145)
Total Bilirubin: 0.2 mg/dL — ABNORMAL LOW (ref 0.3–1.2)
Total Protein: 7 g/dL (ref 6.5–8.1)

## 2022-12-10 LAB — URINALYSIS, ROUTINE W REFLEX MICROSCOPIC
Bilirubin Urine: NEGATIVE
Glucose, UA: NEGATIVE mg/dL
Hgb urine dipstick: NEGATIVE
Ketones, ur: NEGATIVE mg/dL
Leukocytes,Ua: NEGATIVE
Nitrite: NEGATIVE
Protein, ur: NEGATIVE mg/dL
Specific Gravity, Urine: 1.016 (ref 1.005–1.030)
pH: 7 (ref 5.0–8.0)

## 2022-12-10 LAB — TSH: TSH: 0.513 u[IU]/mL (ref 0.350–4.500)

## 2022-12-10 LAB — MAGNESIUM: Magnesium: 1.8 mg/dL (ref 1.7–2.4)

## 2022-12-10 LAB — PHOSPHORUS: Phosphorus: 2.5 mg/dL (ref 2.5–4.6)

## 2022-12-10 LAB — TROPONIN I (HIGH SENSITIVITY): Troponin I (High Sensitivity): 4 ng/L (ref <18)

## 2022-12-10 LAB — LIPASE, BLOOD: Lipase: 26 U/L (ref 11–51)

## 2022-12-10 MED ORDER — LOSARTAN POTASSIUM 50 MG PO TABS: 25.0000 mg | ORAL_TABLET | Freq: Every day | ORAL | 1 refills | Status: DC

## 2022-12-10 MED ORDER — TRAMADOL HCL 50 MG PO TABS: 50.0000 mg | ORAL_TABLET | Freq: Three times a day (TID) | ORAL | 0 refills | Status: DC | PRN

## 2022-12-10 MED ORDER — GABAPENTIN 300 MG PO CAPS: 300.0000 mg | ORAL_CAPSULE | Freq: Three times a day (TID) | ORAL | 1 refills | Status: DC | PRN

## 2022-12-10 MED ORDER — LOSARTAN POTASSIUM 50 MG PO TABS: 50.0000 mg | ORAL_TABLET | Freq: Every day | ORAL | 1 refills | Status: DC

## 2022-12-10 MED ORDER — SODIUM CHLORIDE 0.9 % IV SOLN: Freq: Once | INTRAVENOUS | Status: AC

## 2022-12-10 NOTE — ED Triage Notes (Signed)
Per EMS  Low HR at PCP 40-52 New for patient Fatigue Ongoing 3 days Poor Eating  Ongoing 3 days Dizziness Ongoing 3 days Chest pain Started after being told HR was low  Ems vs  BP 196/98 HR 48 RR 20 O2 100% GCS 15

## 2022-12-10 NOTE — Progress Notes (Addendum)
I was consulted by ED physician for admission for observation for weakness.  Went to the room and evaluated the patient at the bedside.  She was sitting comfortably in the bed.  Says she has not eaten all day and that she is hungry. Also says she has not been drinking much fluid lately.  She says she has to force herself to drink water because she forgets about it.  In the ED, her heart rates were stable in the 50s to mid 50s in normal sinus rhythm.  During my time in the exam room, rates were similar.  She is hemodynamically stable, A&O x 4.  She is well-appearing, conversational. I had her stand up and walk a little bit.  She did not have any orthostasis, and she was steady on her feet.  Denies any chest pain, tightness, shortness of breath, weakness, dizziness. She does say that she had a headache, and her blood pressures were really high this morning.  However, after taking losartan 25 mg, she says they have come down to the 140s/90s.  I cycled the blood pressure at the bedside and reading was 147/95.  She does not have any rapid changes in her heart rate with position changes.  Her rates also remained stable in the 50s with sitting and conversing.  Husband also at the bedside.  If given work note, he will be able to stay with her overnight at home.  Labwork : Troponins flat, CBC, CMP, Mg, Phos, TSH all unremarkable. Chest X-ray with no acute abnormality.  Using shared decision making, opted for discharge home from the ED with follow-up in the clinic tomorrow morning to see how she is doing with cardiology referral for outpatient workup. We extensively discussed overnight ED return precautions should she have faintness, loss of consciousness, chest pain, difficulty breathing, palpitations.  I discussed the above with my attending physician who agrees with care plan.  Appointment scheduled at [facility] clinic for 8:30 AM.  Darral Dash, DO PGY-3 [facility]

## 2022-12-10 NOTE — Assessment & Plan Note (Signed)
Instructed patient to take 50 mg of losartan daily not 25.  Changed medication to reflect proper prescription.  Follow-up 1 month.  Instructed to purchase blood pressure cuff.

## 2022-12-10 NOTE — ED Provider Notes (Addendum)
  Physical Exam  BP 136/84   Pulse (!) 44   Temp 98.5 F (36.9 C)   Resp 11   Ht 1.676 m (5\' 6" )   Wt 85.7 kg   SpO2 100%   BMI 30.51 kg/m   Physical Exam  Procedures  Procedures  ED Course / MDM    Medical Decision Making Amount and/or Complexity of Data Reviewed Labs: ordered. Radiology: ordered.  Risk Prescription drug management.   62 yo fpc for weakness thought to be related to bradycardia. Rates reported in 40s HR in 50s here  Labs normal  CXR appears clear Plan consult for admission for monitoring. Discussed with Marissa, on call for FP who states she has seen patient and discussed with attending and they feel patient can be discharged to home. No note to review as of yet, patient voicing to nurse that she would like to go. Discussed with patient and she is to f/u at [facility] in a.m.   She voices understanding of plan to return if she is feeling weak again, chest pain, passes out or any other new symptoms        Margarita Grizzle, MD 12/10/22 1648    Margarita Grizzle, MD 12/10/22 1712    Margarita Grizzle, MD 12/10/22 1714

## 2022-12-10 NOTE — Assessment & Plan Note (Signed)
Requesting refills of gabapentin and tramadol today.  Refills appropriate, ordered.

## 2022-12-10 NOTE — Discharge Instructions (Addendum)
You were evaluated here in the emergency department today for weakness. Your heart tracing shows a rate of 55 and your labs are normal Return here if you are feeling increased weakness, chest pain, shortness of breath, or other new symptoms.  Please follow-up as scheduled tomorrow morning at 830 in family practice.

## 2022-12-10 NOTE — Patient Instructions (Addendum)
It was great to see you! Thank you for allowing me to participate in your care!  Our plans for today:  - Please buy a blood pressure cuff so you can measure at home. - If you do not start feeling better by Monday please make an appoint to be seen again. - Please take the losartan 50mg . - I will let you know the results of your labs.   Please arrive 15 minutes PRIOR to your next scheduled appointment time! If you do not, this affects OTHER patients' care.  Take care and seek immediate care sooner if you develop any concerns.   Celine Mans, MD, PGY-2 [facility] 11:39 AM 12/10/2022  [facility]

## 2022-12-10 NOTE — Progress Notes (Signed)
    SUBJECTIVE:   CHIEF COMPLAINT / HPI: feeling weaker  Ate chicken pot pies x2 on Tuesday. Had multiple bowel movements, but not diarrhea. Feeling weaker and lightheaded since then. Has been drinking water but eating less. Denies nausea or vomiting. Has not taken Tylenol. Has increased fatigue. Endorses some chest pressure.  Hypertension - Patient states she is only taking 25 mg losartan. Prescription is for 50mg . Does not check blood pressure at home.  PERTINENT  PMH / PSH: Hypertension, COPD, rotator cuff tendinitis, HIV  OBJECTIVE:   BP (!) 163/87   Pulse (!) 44   Temp 98.1 F (36.7 C) (Oral)   Ht 5\' 6"  (1.676 m)   Wt 189 lb 4 oz (85.8 kg)   SpO2 100%   BMI 30.55 kg/m   General: NAD  Neuro: A&O, pupils equal round and reactive, strength 5/5 bilateral upper extremities HEENT: mildly dry mucous membranes Cardiovascular: RRR, no murmurs, no peripheral edema Respiratory: normal WOB on RA, CTAB, no wheezes, ronchi or rales Abdomen: soft, NTTP, no rebound or guarding Extremities: Moving all 4 extremities equally   ASSESSMENT/PLAN:   Assessment & Plan Bradycardia Presenting today with 2-day history of fatigue, lightheadedness setting of GI upset.  However, patient bradycardic to 40s which may be contributing to her symptoms.  EKG demonstrated sinus bradycardia, reassuringly no ST changes from previous EKG.  Given that I cannot rule out symptomatic sinus bradycardia or intermittent heart block as a cause of her symptoms, patient needs urgent cardiology eval.  Discussed with patient who will take EMS to [facility] for evaluation. Essential hypertension Instructed patient to take 50 mg of losartan daily not 25.  Changed medication to reflect proper prescription.  Follow-up 1 month.  Instructed to purchase blood pressure cuff. Chronic bilateral low back pain with right-sided sciatica Requesting refills of gabapentin and tramadol today.  Refills appropriate, ordered. Return  in about 1 week (around 12/17/2022).  Celine Mans, MD [facility]

## 2022-12-10 NOTE — ED Provider Notes (Signed)
[place] EMERGENCY DEPARTMENT AT [facility] Provider Note   CSN: [id number] Arrival date & time: 12/10/22  1303     History  Chief Complaint  Patient presents with   Dizziness   Fatigue   Chest Pain    Courtney Hamilton is a 62 y.o. female.  HPI Patient reports she is felt very fatigued the past 3 days.  She reports 3 days ago she had an episode where she had multiple bowel movements although she would not qualify them as diarrheal.  She did not continue to have any diarrhea or vomiting.  Patient reports however she has been very lightheaded particularly with standing or activity.  She has had an overwhelming level of fatigue.  Patient was seen at the family practice office by her primary provider.  She reports they identified that she had a slow heart rate there and after discussing this she also developed some chest pressure.  She denies that she had had chest pressure before going into the office.  Now it is the achy sensation in her anterior chest.  She has not had any fever that she is aware of.  No productive cough.    Home Medications Prior to Admission medications   Medication Sig Start Date End Date Taking? Authorizing Provider  albuterol (VENTOLIN HFA) 108 (90 Base) MCG/ACT inhaler TAKE 2 PUFFS BY MOUTH EVERY 6 HOURS AS NEEDED FOR WHEEZE OR SHORTNESS OF BREATH 06/17/22   Celine Mans, MD  Ascorbic Acid (VITAMIN C) 1000 MG tablet Take 1,000 mg by mouth at bedtime.    [provider]  azithromycin (ZITHROMAX) 500 MG tablet Take 1 tablet (500 mg total) by mouth daily. 08/31/22   Lilland, Alana, DO  Black Cohosh-Soy-Ginkgo-Magnol (ESTROVEN MOOD & MEMORY PO) Take 1 tablet by mouth daily.    [provider]  Budeson-Glycopyrrol-Formoterol (BREZTRI AEROSPHERE) 160-9-4.8 MCG/ACT AERO Inhale 2 puffs into the lungs 2 (two) times daily. Rinse mouth after use to avoid thrush. 04/29/22   McDiarmid, Leighton Roach, MD  Cholecalciferol (VITAMIN D3) 25 MCG (1000 UT)  capsule Take 1,000 Units by mouth daily.    [provider]  COLLAGEN PO Take 1 Scoop by mouth daily. Powder mixed into smoothie    [provider]  diclofenac Sodium (VOLTAREN) 1 % GEL APPLY 2 GRAMS TO AFFECTED AREA 4 TIMES A DAY 09/28/22   Celine Mans, MD  dolutegravir (TIVICAY) 50 MG tablet Take 1 tablet (50 mg total) by mouth daily. 11/26/22 11/26/23  Judyann Munson, MD  emtricitabine-tenofovir AF (DESCOVY) 200-25 MG tablet Take 1 tablet by mouth daily. 11/26/22 11/26/23  Judyann Munson, MD  estradiol (CLIMARA - DOSED IN MG/24 HR) 0.1 mg/24hr patch PLACE 1 PATCH (0.1 MG TOTAL) ONTO THE SKIN ONCE A WEEK. 09/01/22   Brock Bad, MD  fluticasone (FLONASE) 50 MCG/ACT nasal spray Place 2 sprays into both nostrils daily. 03/30/22   Levin Erp, MD  gabapentin (NEURONTIN) 300 MG capsule Take 1 capsule (300 mg total) by mouth 3 (three) times daily as needed. TAKE 1 CAPSULE BY MOUTH THREE TIMES A DAY As needed. 12/10/22   Celine Mans, MD  losartan (COZAAR) 50 MG tablet Take 1 tablet (50 mg total) by mouth at bedtime. 12/10/22   Celine Mans, MD  Melatonin 5 MG CAPS Take 1 capsule by mouth at bedtime.    [provider]  Multiple Vitamin (MULTIVITAMIN+ PO) Take 1 capsule by mouth daily. Magnesium + zinc    [provider]  rosuvastatin (CRESTOR) 10  MG tablet Take 1 tablet (10 mg total) by mouth daily. 03/19/22   Judyann Munson, MD  traMADol (ULTRAM) 50 MG tablet Take 1 tablet (50 mg total) by mouth 3 (three) times daily as needed for moderate pain. Please make appointment before next refill. 12/10/22   Celine Mans, MD      Allergies    Baclofen, Bactrim [sulfamethoxazole-trimethoprim], and Doxycycline    Review of Systems   Review of Systems  Physical Exam Updated Vital Signs BP 136/84   Pulse (!) 44   Temp 98.6 F (37 C) (Oral)   Resp 11   Ht 5\' 6"  (1.676 m)   Wt 85.7 kg   SpO2 100%   BMI 30.51 kg/m  Physical Exam Constitutional:       Comments: Alert nontoxic.  No respiratory distress.  HENT:     Mouth/Throat:     Mouth: Mucous membranes are moist.     Pharynx: Oropharynx is clear.  Eyes:     Extraocular Movements: Extraocular movements intact.  Cardiovascular:     Rate and Rhythm: Normal rate and regular rhythm.  Pulmonary:     Effort: Pulmonary effort is normal.     Breath sounds: Normal breath sounds.  Abdominal:     General: There is no distension.     Palpations: Abdomen is soft.     Tenderness: There is no abdominal tenderness. There is no guarding.  Musculoskeletal:        General: Normal range of motion.     Right lower leg: No edema.     Left lower leg: No edema.  Skin:    General: Skin is warm and dry.  Neurological:     General: No focal deficit present.     Mental Status: She is oriented to person, place, and time.     Motor: No weakness.     Coordination: Coordination normal.  Psychiatric:        Mood and Affect: Mood normal.     ED Results / Procedures / Treatments   Labs (all labs ordered are listed, but only abnormal results are displayed) Labs Reviewed  COMPREHENSIVE METABOLIC PANEL - Abnormal; Notable for the following components:      Result Value   BUN 7 (*)    Total Bilirubin 0.2 (*)    All other components within normal limits  RESP PANEL BY RT-PCR (RSV, FLU A&B, COVID)  RVPGX2  LIPASE, BLOOD  CBC WITH DIFFERENTIAL/PLATELET  URINALYSIS, ROUTINE W REFLEX MICROSCOPIC  MAGNESIUM  PHOSPHORUS  TSH  TROPONIN I (HIGH SENSITIVITY)    EKG EKG Interpretation Date/Time:  Thursday December 10 2022 13:07:27 EDT Ventricular Rate:  55 PR Interval:  162 QRS Duration:  93 QT Interval:  468 QTC Calculation: 448 R Axis:   67  Text Interpretation: Sinus rhythm no interval change from first pprevious. no ischemic appearnace Confirmed by Arby Barrette [phone]) on 12/10/2022 4:05:46 PM  Radiology No results found.  Procedures Procedures    Medications Ordered in  ED Medications  0.9 %  sodium chloride infusion ( Intravenous New Bag/Given 12/10/22 1511)    ED Course/ Medical Decision Making/ A&P                                 Medical Decision Making Amount and/or Complexity of Data Reviewed Labs: ordered. Radiology: ordered.  Risk Prescription drug management.   Patient is coming from outpatient office with concern for bradycardia  and general fatigue and lightheadedness over the past week.  Will proceed with diagnostic evaluation for ACS\electrolyte imbalance.  Troponin 4 metabolic panel normal with GFR greater than 60 urinalysis negative CBC normal with normal differential.  EKG reviewed by myself sinus bradycardia.  I have observed patient's rhythm on the monitor.  Ranges from low 50s to mid 50s narrow complex sinus rhythm.  Consult: Reviewed with family practice teaching service for admission.  Currently patient has sinus rhythm without block or ischemic change.  She does describe symptoms of weakness and lightheadedness that could represent dysrhythmia.  Consult to family practice for observation.        Final Clinical Impression(s) / ED Diagnoses Final diagnoses:  Bradycardia  Weakness  Other fatigue    Rx / DC Orders ED Discharge Orders     None         Arby Barrette, MD 12/10/22 1609

## 2022-12-11 ENCOUNTER — Other Ambulatory Visit: Payer: Self-pay | Admitting: Family Medicine

## 2022-12-11 ENCOUNTER — Ambulatory Visit: Payer: 59 | Attending: Family Medicine

## 2022-12-11 ENCOUNTER — Ambulatory Visit (HOSPITAL_COMMUNITY)
Admission: RE | Admit: 2022-12-11 | Discharge: 2022-12-11 | Disposition: A | Discharge status: Home / Self Care | Payer: 59 | Source: Ambulatory Visit | Attending: Family Medicine | Admitting: Family Medicine

## 2022-12-11 ENCOUNTER — Encounter: Payer: Self-pay | Admitting: Family Medicine

## 2022-12-11 ENCOUNTER — Telehealth: Payer: Self-pay | Admitting: *Deleted

## 2022-12-11 ENCOUNTER — Other Ambulatory Visit: Payer: Self-pay

## 2022-12-11 ENCOUNTER — Ambulatory Visit (INDEPENDENT_AMBULATORY_CARE_PROVIDER_SITE_OTHER): Payer: 59 | Admitting: Family Medicine

## 2022-12-11 VITALS — BP 146/85 | HR 49 | Ht 66.0 in | Wt 189.8 lb

## 2022-12-11 DIAGNOSIS — I10 Essential (primary) hypertension: Secondary | ICD-10-CM | POA: Insufficient documentation

## 2022-12-11 DIAGNOSIS — R001 Bradycardia, unspecified: Secondary | ICD-10-CM

## 2022-12-11 DIAGNOSIS — Z6830 Body mass index (BMI) 30.0-30.9, adult: Secondary | ICD-10-CM | POA: Diagnosis not present

## 2022-12-11 DIAGNOSIS — G4700 Insomnia, unspecified: Secondary | ICD-10-CM | POA: Insufficient documentation

## 2022-12-11 DIAGNOSIS — E669 Obesity, unspecified: Secondary | ICD-10-CM

## 2022-12-11 DIAGNOSIS — R079 Chest pain, unspecified: Secondary | ICD-10-CM

## 2022-12-11 DIAGNOSIS — F5101 Primary insomnia: Secondary | ICD-10-CM

## 2022-12-11 MED ORDER — ASPIRIN 325 MG PO TABS
325.0000 mg | ORAL_TABLET | Freq: Once | ORAL | Status: AC
Start: 2022-12-11 — End: 2022-12-11
  Administered 2022-12-11: 325 mg via ORAL

## 2022-12-11 NOTE — Assessment & Plan Note (Signed)
Given intermittent dizziness when she stands we will reduce her losartan back to 25.  For now we will reduce her losartan to 25 mg.  Her standing blood pressure was improved.  She is going to take [facility] blood pressure [facility] and return with those for Dr. Velna Ochs.

## 2022-12-11 NOTE — Patient Instructions (Addendum)
It was wonderful to see you today.  Please bring ALL of your medications with you to every visit.   Today we talked about:   MY STRONGEST RECOMMENDATION TODAY IS THAT YOU PROCEED TO THE EMERGENCY ROOM.  I RECOMMENDED YOU GO BY EMS. YOU DECLINED.    -Going to see a heart doctor--they will call you    - I ordered a monitor for your heart--please call   [phone]  - You have heart ultrasound coming up  Please schedule with Dr. Velna Ochs in 2 weeks  -If you feel chest pain, like you are going to pass out, or palpitations please proceed to the Emergency Room again - For now, please take 25 mg of Losartan instead of 50--Please monitor your blood pressure twice a day at home.   For your sleep - Try to keep the TV off - No phone in the room - Keep the room quiet and dark   11 Tips to Follow: No caffeine after 3pm: Avoid beverages with caffeine (soda, tea, energy drinks, etc.) especially after 3pm.  Don't go to bed hungry: Have your evening meal at least 3 hrs. before going to sleep. It's fine to have a small bedtime snack such as a glass of milk and a few crackers but don't have a big meal.  Have a nightly routine before bed: Plan on "winding down" before you go to sleep. Begin relaxing about 1 hour before you go to bed. Try doing a quiet activity such as listening to calming music, reading a book or meditating.  Turn off the TV and ALL electronics including video games, tablets, laptops, etc. 1 hour before sleep, and keep them out of the bedroom.  Turn off your cell phone and all notifications (new email and text alerts) or even better, leave your phone outside your room while you sleep. Studies have shown that a part of your brain continues to respond to certain lights and sounds even while you're still asleep.  Make your bedroom quiet, dark and cool. If you can't control the noise, try wearing earplugs or using a fan to block out other sounds.  Practice relaxation  techniques. Try reading a book or meditating or drain your brain by writing a list of what you need to do the next day.  Don't nap unless you feel sick: you'll have a better night's sleep.  Don't smoke, or quit if you do. Nicotine, alcohol, and marijuana can all keep you awake. Talk to your health care provider if you need help with substance use.  Most importantly, wake up at the same time every day (or within 1 hour of your usual wake up time) EVEN on the weekends. A regular wake up time promotes sleep hygiene and prevents sleep problems.  Reduce exposure to bright light in the last three hours of the day before going to sleep.  Thank you for choosing [facility].   Please call [phone] with any questions about today's appointment.  Please be sure to schedule follow up at the front  desk before you leave today.   Terisa Starr, MD  Family Medicine

## 2022-12-11 NOTE — Telephone Encounter (Signed)
Patient states she is returning a call regarding her event monitor order.

## 2022-12-11 NOTE — Assessment & Plan Note (Signed)
Discussed underlying potential for anxiety sleep hygiene.  Recommend follow-up with Dr. Velna Ochs to discuss further will be discussed that medications are generally not efficacious/safe for this.

## 2022-12-11 NOTE — Progress Notes (Unsigned)
Enrolled patient for a 7 day Zio XT monitor to be mailed to patients home.  

## 2022-12-11 NOTE — Progress Notes (Signed)
SUBJECTIVE:   CHIEF COMPLAINT: insomnia, follow up ED HPI:   Courtney Hamilton is a 62 y.o.  with history notable for very well-controlled HIV, hypertension and remote history of anxiety presenting for emergency department follow-up.  I saw the patient yesterday at which time she was referred to the ER for possibly symptomatic sinus bradycardia.  Her labs were unremarkable troponin x 1 was negative.  This morning she reports overnight she had no chest pain no syncope no headache.  She does have intermittent dizziness when she stands which has been ongoing with the increase in her blood pressure medication.    The patient reports her major concern is her weight.  Her weight has progressively increased.  She reports she eats very little.  She has avocado toast for breakfast varies throughout the day and popcorn at night.  She drinks only water.  She tries to stay active but feels her weight prohibits this.  She is uncertain if she is interested in medication, nutrition consult or discussion with healthy weight and wellness.  She had a thyroid test yesterday which was normal.  The patient's other major concern today is insomnia.  She is out of the longstanding history of this.  She goes to lay down in bed each at 9 PM.  She turns on the TV.  She plays games on her phone.  She then falls asleep around 1 AM.  She does not wake up feeling rested.  Unknown if she snores at night.   At the end of our visit the patient reported substernal chest pain.  This is right sided precordial pressure.  No associated dyspnea nausea vomiting or palpitations.  She has a family history of cardiomyopathy in her father and her marked terminal grandmother.  This lasted 3 minutes.  She does not take aspirin daily. PERTINENT  PMH / PSH/Family/Social History : Relevant medical history reviewed including HIV on a statin and family history reviewed  OBJECTIVE:   BP (!) 146/85   Pulse (!) 49   Ht 5\' 6"  (1.676 m)   Wt 189 lb  12.8 oz (86.1 kg)   SpO2 100%   BMI 30.63 kg/m   Today's weight:  Last Weight  Most recent update: 12/11/2022  8:33 AM    Weight  86.1 kg (189 lb 12.8 oz)            Review of prior weights: Filed Weights   12/11/22 0833  Weight: 189 lb 12.8 oz (86.1 kg)   Woman in no distress initially.  She is intermittently tearful at the end of the exam.  Heart rate is in the 50s on my exam and improved on EKG.  Lungs clear bilaterally.  There is no lower extremity edema JVP is at 5 cm.  EKG is reviewed and shows sinus bradycardia without ST changes.  Labs are reviewed from yesterday. There is no high degree heart block.  ASSESSMENT/PLAN:   Assessment & Plan Bradycardia Suspect this is unrelated to her dizziness.  I do think her dizziness may be related to her hypertension when she stands potentially related to aging baroreceptors.   For her bradycardia discussed reasons to return to the emergency room.  I think most likely this may be related to either undiagnosed sleep apnea or physiologic normal bradycardia.  She seems to have run in the 50s for years.  Also considered high degree heart block.  Echo ordered.  Also ordered a Zio patch and refer the patient to cardiology. Number  given to call.  Chest pain, unspecified type Differential considered includes acute coronary syndrome, symptomatic bradycardia,  but also considered anxiety.  Given that she only had 1 troponin yesterday and this is new onset substernal chest pressure with her family history and underlying risk factors for cardiovascular disease I recommended immediate evaluation ER.  I recommended transport via EMS.  324 mg of aspirin were given.  EKG as above.  Discussed complications from an undiagnosed heart attack.  Patient declined to go to the ER.  She was able to explain the complications of heart attack.  She did report she was over the weekend if her symptoms persisted or worsen. Essential hypertension Given intermittent  dizziness when she stands we will reduce her losartan back to 25.  For now we will reduce her losartan to 25 mg.  Her standing blood pressure was improved.  She is going [facility] and return with those for Dr. Velna Ochs. Primary insomnia Discussed underlying potential for anxiety sleep hygiene.  Recommend follow-up with Dr. Velna Ochs to discuss further will be discussed that medications are generally not efficacious/safe for this. Obesity (BMI 30-39.9) Discussed options including healthy weight and wellness, nutritionist and potential medication.  Recommended she does schedule a separate visit with Dr. Velna Ochs to further discuss this.  The patient's had a normal recent TSH.    Terisa Starr, MD  Family Medicine Teaching Service  [facility]

## 2022-12-14 ENCOUNTER — Ambulatory Visit: Payer: 59 | Admitting: Physical Therapy

## 2022-12-14 NOTE — Therapy (Deleted)
OUTPATIENT PHYSICAL THERAPY SHOULDER EVALUATION   Patient Name: Courtney Hamilton MRN: [id number] DOB:Sep 13, 1960, 62 y.o., female Today's Date: 12/14/2022  END OF SESSION:   Past Medical History:  Diagnosis Date   Arthritis    right hip   Bronchitis last time  sept/oct 2021   Flu-like symptoms 05/25/2016   Fungal infection of skin of abdomen 06/16/2017   Hepatitis 1980's   NON A NON B tx for 1980's resolved   Herniated disc 10/07/2011   lower back   History of chronic kidney disease 1980's   resolved   HIV (human immunodeficiency virus infection) (HCC) 1985   Hypertension    Lower abdominal pain 10/08/2014   Lower GI bleed 05/10/2012   pt denies   Lower leg edema 12/10/2011   Neuropathy    resolved   Osteoporosis    Panic attacks 12/22/2013   Recurrent UTI 08/06/2011   Ruptured lumbar disc    Sciatica    right side   Substance abuse (HCC)    past hsitory  clean more than 20 years    TB (pulmonary tuberculosis) 1993   exposure, treated    Thickened endometrium    VIN III (vulvar intraepithelial neoplasia III)    Past Surgical History:  Procedure Laterality Date   APPENDECTOMY  1980's   BREAST EXCISIONAL BIOPSY Left 2001 per pt   cyst removed    CHOLECYSTECTOMY  1980's   CO2 LASER APPLICATION N/A 02/05/2020   Procedure: CO2 LASER APPLICATION OF VULVA (PERI-CLITORAL AND PERI-ANAL);  Surgeon: Carver Fila, MD;  Location: [facility];  Service: Gynecology;  Laterality: N/A;   ECTOPIC PREGNANCY SURGERY  1985   injections to lower back  09/2017   LAPAROSCOPY N/A 07/03/2019   Procedure: LAPAROSCOPY DIAGNOSTIC; LYSIS OF ADHESIONS;  Surgeon: Carver Fila, MD;  Location: [facility];  Service: Gynecology;  Laterality: N/A;   ROBOTIC ASSISTED TOTAL HYSTERECTOMY WITH BILATERAL SALPINGO OOPHERECTOMY N/A 07/03/2019   Procedure: XI ROBOTIC ASSISTED TOTAL HYSTERECTOMY WITH UNILATERAL SALPINGO OOPHORECTOMY;  Surgeon: Carver Fila, MD;  Location: [facility];   Service: Gynecology;  Laterality: N/A;   SALIVARY GLAND SURGERY  1990   VULVECTOMY N/A 01/18/2018   Procedure: PARTIAL VULVECTOMY;  Surgeon: De Blanch, MD;  Location: [facility];  Service: Gynecology;  Laterality: N/A;   Patient Active Problem List   Diagnosis Date Noted   Biceps tendinitis of right shoulder 11/03/2022   Right rotator cuff tendinitis 06/30/2022   Overweight (BMI 25.0-29.9) 04/21/2022   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   Cervical stenosis (uterine cervix)    Essential hypertension 08/05/2017   Insomnia 11/05/2015   Former cigarette smoker 11/05/2015   Lumbosacral spondylosis without myelopathy 09/29/2013   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  Rationale for Evaluation and Treatment: {HABREHAB:27488}  ONSET DATE: ***  SUBJECTIVE:  SUBJECTIVE STATEMENT: *** Hand dominance: {MISC; OT HAND DOMINANCE:[phone]}  PERTINENT HISTORY: ***  PAIN:  Are you having pain? {OPRCPAIN:27236}  PRECAUTIONS: {Therapy precautions:24002}  RED FLAGS: {PT Red Flags:29287}   WEIGHT BEARING RESTRICTIONS: {Yes ***/No:24003}  FALLS:  Has patient fallen in last 6 months? {fallsyesno:27318}  LIVING ENVIRONMENT: Lives with: {OPRC lives with:25569::"lives with their family"} Lives in: {Lives  in:25570} Stairs: {opstairs:27293} Has following equipment at home: {Assistive devices:23999}  OCCUPATION: ***  PLOF: {PLOF:24004}  PATIENT GOALS:***  NEXT MD VISIT:   OBJECTIVE:  Note: Objective measures were completed at Evaluation unless otherwise noted.  DIAGNOSTIC FINDINGS:  ***  PATIENT SURVEYS:  {rehab surveys:24030:a}  COGNITION: Overall cognitive status: {cognition:24006}     SENSATION: {sensation:27233}  POSTURE: ***  UPPER EXTREMITY ROM:   {AROM/PROM:27142} ROM Right eval Left eval  Shoulder flexion    Shoulder extension    Shoulder abduction    Shoulder adduction    Shoulder internal rotation    Shoulder external rotation    Elbow flexion    Elbow extension    Wrist flexion    Wrist extension    Wrist ulnar deviation    Wrist radial deviation    Wrist pronation    Wrist supination    (Blank rows = not tested)  UPPER EXTREMITY MMT:  MMT Right eval Left eval  Shoulder flexion    Shoulder extension    Shoulder abduction    Shoulder adduction    Shoulder internal rotation    Shoulder external rotation    Middle trapezius    Lower trapezius    Elbow flexion    Elbow extension    Wrist flexion    Wrist extension    Wrist ulnar deviation    Wrist radial deviation    Wrist pronation    Wrist supination    Grip strength (lbs)    (Blank rows = not tested)  SHOULDER SPECIAL TESTS: Impingement tests: {shoulder impingement test:25231:a} SLAP lesions: {SLAP lesions:25232} Instability tests: {shoulder instability test:25233} Rotator cuff assessment: {rotator cuff assessment:25234} Biceps assessment: {biceps assessment:25235}  JOINT MOBILITY TESTING:  ***  PALPATION:  ***   TODAY'S TREATMENT:                                                                                                                                         DATE: ***   PATIENT EDUCATION: Education details: *** Person educated: {Person  educated:25204} Education method: {Education Method:25205} Education comprehension: {Education Comprehension:25206}  HOME EXERCISE PROGRAM: ***  ASSESSMENT:  CLINICAL IMPRESSION: Patient is a *** y.o. *** who was seen today for physical therapy evaluation and treatment for ***.   OBJECTIVE IMPAIRMENTS: {opptimpairments:25111}.   ACTIVITY LIMITATIONS: {activitylimitations:27494}  PARTICIPATION LIMITATIONS: {participationrestrictions:25113}  PERSONAL FACTORS: {Personal factors:25162} are also affecting patient's functional outcome.   REHAB POTENTIAL: {rehabpotential:25112}  CLINICAL DECISION MAKING: {clinical decision making:25114}  EVALUATION COMPLEXITY: {Evaluation complexity:25115}   GOALS: Goals reviewed  with patient? {yes/no:20286}  SHORT TERM GOALS: Target date: ***  *** Baseline: Goal status: INITIAL  2.  *** Baseline:  Goal status: INITIAL  3.  *** Baseline:  Goal status: INITIAL  4.  *** Baseline:  Goal status: INITIAL  5.  *** Baseline:  Goal status: INITIAL  6.  *** Baseline:  Goal status: INITIAL  LONG TERM GOALS: Target date: ***  *** Baseline:  Goal status: INITIAL  2.  *** Baseline:  Goal status: INITIAL  3.  *** Baseline:  Goal status: INITIAL  4.  *** Baseline:  Goal status: INITIAL  5.  *** Baseline:  Goal status: INITIAL  6.  *** Baseline:  Goal status: INITIAL  PLAN:  PT FREQUENCY: {rehab frequency:25116}  PT DURATION: {rehab duration:25117}  PLANNED INTERVENTIONS: {rehab planned interventions:25118::"Therapeutic exercises","Therapeutic activity","Neuromuscular re-education","Balance training","Gait training","Patient/Family education","Self Care","Joint mobilization"}  PLAN FOR NEXT SESSION: ***   Braxtyn Bojarski, PT 12/14/2022, 7:52 AM

## 2022-12-15 DIAGNOSIS — R001 Bradycardia, unspecified: Secondary | ICD-10-CM | POA: Diagnosis not present

## 2022-12-22 ENCOUNTER — Ambulatory Visit: Payer: 59 | Admitting: Family Medicine

## 2022-12-22 ENCOUNTER — Encounter: Payer: Self-pay | Admitting: Family Medicine

## 2022-12-22 VITALS — BP 121/79 | HR 53 | Ht 66.0 in | Wt 190.2 lb

## 2022-12-22 DIAGNOSIS — R001 Bradycardia, unspecified: Secondary | ICD-10-CM

## 2022-12-22 DIAGNOSIS — E669 Obesity, unspecified: Secondary | ICD-10-CM

## 2022-12-22 DIAGNOSIS — S060X0A Concussion without loss of consciousness, initial encounter: Secondary | ICD-10-CM

## 2022-12-22 LAB — POCT GLYCOSYLATED HEMOGLOBIN (HGB A1C): Hemoglobin A1C: 5.9 % — AB (ref 4.0–5.6)

## 2022-12-22 NOTE — Progress Notes (Signed)
    SUBJECTIVE:   CHIEF COMPLAINT / HPI: f/u  Bradycardia - No chest pain, or palpitations.   Hit head on towel rack last weekend (8-9) days ago. No LOC. No dizziness. Did have a headache, mild photophobia, had to decrease screen time. Feels it is better now.   Obesity  -  Has reduced popcorn intake. Has started some "chair" exercises at home. Feels that she is still gaining weight.  Would like A1c checked.  Chronic pain - Trying to take 2 rather 3 daily of gabapentin.  Thinks current amount of tramadol still helping her to manage her pain functionally at home.  PERTINENT  PMH / PSH: HTN, Bradycardia, COPD, HIV  OBJECTIVE:   BP 121/79   Pulse (!) 53   Ht 5\' 6"  (1.676 m)   Wt 190 lb 4 oz (86.3 kg)   SpO2 98%   BMI 30.71 kg/m   General: NAD Head: No obvious bruising, asymmetry, abrasion or laceration where patient states she impacted her shower. Neuro: A&O, cranial nerves II through XII intact, strength out of 5 bilateral upper and lower extremities, normal heel-to-shin, normal finger-nose Neck: Full active range of motion, no tenderness to palpation along spinous processes Cardiovascular: RRR, no murmurs, no peripheral edema Respiratory: normal WOB on RA, CTAB, no wheezes, ronchi or rales Extremities: Moving all 4 extremities equally   ASSESSMENT/PLAN:   Assessment & Plan Obesity (BMI 30-39.9) Patient agreeable to referral to Dr. Gerilyn Pilgrim for nutrition therapy.  A1c mildly elevated at 5.9.  Will discuss possibility of medical management of weight follow-up visit.  GLP-1's unlikely to be covered as she does not have diabetes. Bradycardia Discussed that she should receive call to schedule appointment with cardiology that was placed by Dr. Manson Passey.  Follow-up echo scheduled for November 4. Concussion without loss of consciousness, initial encounter Improving, no red flag signs.  Suspect that her concussion has resolved as she is now asymptomatic and is over a week since injury.   Discussed that she may return to her full activity levels. Return in about 4 weeks (around 01/19/2023).  Courtney Mans, MD [facility]

## 2022-12-22 NOTE — Assessment & Plan Note (Signed)
Patient agreeable to referral to Dr. Gerilyn Pilgrim for nutrition therapy.  A1c mildly elevated at 5.9.  Will discuss possibility of medical management of weight follow-up visit.  GLP-1's unlikely to be covered as she does not have diabetes.

## 2022-12-22 NOTE — Patient Instructions (Addendum)
It was great to see you! Thank you for allowing me to participate in your care!  Our plans for today:  - Please call this number to schedule your appointment with Dr. Gerilyn Pilgrim to discuss nutrition [phone].  I am glad you are starting to do more exercise. -Your concussion should continue to improve. -Please make sure to get the echocardiogram of your heart from November 4.   Please arrive 15 minutes PRIOR to your next scheduled appointment time! If you do not, this affects OTHER patients' care.  Take care and seek immediate care sooner if you develop any concerns.   Celine Mans, MD, PGY-2 [facility] 3:01 PM 12/22/2022  [facility]

## 2022-12-24 ENCOUNTER — Ambulatory Visit: Payer: 59 | Admitting: Internal Medicine

## 2022-12-28 ENCOUNTER — Other Ambulatory Visit: Payer: Self-pay

## 2022-12-30 ENCOUNTER — Other Ambulatory Visit: Payer: Self-pay

## 2022-12-30 DIAGNOSIS — R001 Bradycardia, unspecified: Secondary | ICD-10-CM | POA: Diagnosis not present

## 2022-12-30 NOTE — Progress Notes (Signed)
Specialty Pharmacy Ongoing Clinical Assessment Note  Courtney Hamilton is a 62 y.o. female who is being followed by the specialty pharmacy service for RxSp HIV   Patient's specialty medication(s) reviewed today: Dolutegravir Sodium; Emtricitabine-Tenofovir Af   Missed doses in the last 4 weeks: 0   Patient/Caregiver did not have any additional questions or concerns.   Therapeutic benefit summary: Patient is achieving benefit   Adverse events/side effects summary: No adverse events/side effects   Patient's therapy is appropriate to: Continue    Goals Addressed             This Visit's Progress    Achieve Undetectable HIV Viral Load < 20       Patient is on track. Patient will maintain adherence         Follow up:  6 months  Zuleika Gallus E [facility] Specialty Pharmacist

## 2022-12-30 NOTE — Progress Notes (Signed)
Specialty Pharmacy Refill Coordination Note  Courtney Hamilton is a 63 y.o. female contacted today regarding refills of specialty medication(s) Dolutegravir Sodium; Emtricitabine-Tenofovir Af   Patient requested Delivery   Delivery date: 01/04/23   Verified address: [address]  [place] [address]   Medication will be filled on 01/01/23.

## 2023-01-05 ENCOUNTER — Other Ambulatory Visit: Payer: Self-pay

## 2023-01-05 ENCOUNTER — Ambulatory Visit: Payer: 59 | Admitting: Internal Medicine

## 2023-01-05 ENCOUNTER — Encounter: Payer: Self-pay | Admitting: Internal Medicine

## 2023-01-05 VITALS — BP 125/77 | HR 55 | Temp 97.9°F | Wt 193.0 lb

## 2023-01-05 DIAGNOSIS — I10 Essential (primary) hypertension: Secondary | ICD-10-CM | POA: Diagnosis not present

## 2023-01-05 DIAGNOSIS — Z87891 Personal history of nicotine dependence: Secondary | ICD-10-CM | POA: Diagnosis not present

## 2023-01-05 DIAGNOSIS — B20 Human immunodeficiency virus [HIV] disease: Secondary | ICD-10-CM

## 2023-01-05 DIAGNOSIS — Z23 Encounter for immunization: Secondary | ICD-10-CM

## 2023-01-05 DIAGNOSIS — Z79899 Other long term (current) drug therapy: Secondary | ICD-10-CM

## 2023-01-06 LAB — T-HELPER CELL (CD4) - (RCID CLINIC ONLY)
CD4 % Helper T Cell: 21 % — ABNORMAL LOW (ref 33–65)
CD4 T Cell Abs: 553 /uL (ref 400–1790)

## 2023-01-07 ENCOUNTER — Other Ambulatory Visit: Payer: Self-pay

## 2023-01-07 DIAGNOSIS — G8929 Other chronic pain: Secondary | ICD-10-CM

## 2023-01-07 MED ORDER — TRAMADOL HCL 50 MG PO TABS: 50.0000 mg | ORAL_TABLET | Freq: Three times a day (TID) | ORAL | 0 refills | Status: DC | PRN

## 2023-01-08 LAB — HIV-1 RNA QUANT-NO REFLEX-BLD
HIV 1 RNA Quant: NOT DETECTED {copies}/mL
HIV-1 RNA Quant, Log: NOT DETECTED {Log_copies}/mL

## 2023-01-08 LAB — RPR: RPR Ser Ql: NONREACTIVE

## 2023-01-11 ENCOUNTER — Ambulatory Visit (HOSPITAL_COMMUNITY)
Admission: RE | Admit: 2023-01-11 | Discharge: 2023-01-11 | Disposition: A | Discharge status: Home / Self Care | Payer: 59 | Source: Ambulatory Visit | Attending: Family Medicine | Admitting: Family Medicine

## 2023-01-11 ENCOUNTER — Other Ambulatory Visit: Payer: Self-pay | Admitting: Internal Medicine

## 2023-01-11 DIAGNOSIS — R001 Bradycardia, unspecified: Secondary | ICD-10-CM | POA: Diagnosis not present

## 2023-01-11 DIAGNOSIS — J449 Chronic obstructive pulmonary disease, unspecified: Secondary | ICD-10-CM | POA: Diagnosis not present

## 2023-01-11 DIAGNOSIS — Z87891 Personal history of nicotine dependence: Secondary | ICD-10-CM | POA: Diagnosis not present

## 2023-01-11 DIAGNOSIS — I10 Essential (primary) hypertension: Secondary | ICD-10-CM | POA: Diagnosis not present

## 2023-01-11 DIAGNOSIS — R0609 Other forms of dyspnea: Secondary | ICD-10-CM

## 2023-01-11 DIAGNOSIS — E785 Hyperlipidemia, unspecified: Secondary | ICD-10-CM | POA: Diagnosis not present

## 2023-01-11 LAB — ECHOCARDIOGRAM COMPLETE
AR max vel: 2.71 cm2
AV Area VTI: 2.63 cm2
AV Area mean vel: 2.65 cm2
AV Mean grad: 5 mm[Hg]
AV Peak grad: 8.6 mm[Hg]
Ao pk vel: 1.47 m/s
Area-P 1/2: 3.77 cm2
S' Lateral: 2.8 cm

## 2023-01-26 ENCOUNTER — Other Ambulatory Visit: Payer: Self-pay

## 2023-01-26 NOTE — Progress Notes (Signed)
Specialty Pharmacy Refill Coordination Note  Sya Brumleve is a 62 y.o. female contacted today regarding refills of specialty medication(s) Dolutegravir Sodium; Emtricitabine-Tenofovir Af   Patient requested Delivery   Delivery date: 02/02/23   Verified address: [address] [place]  [address]   Medication will be filled on 02/01/23.

## 2023-01-29 ENCOUNTER — Other Ambulatory Visit: Payer: Self-pay | Admitting: Family Medicine

## 2023-01-29 DIAGNOSIS — I10 Essential (primary) hypertension: Secondary | ICD-10-CM

## 2023-02-01 ENCOUNTER — Other Ambulatory Visit: Payer: Self-pay | Admitting: Family Medicine

## 2023-02-01 DIAGNOSIS — G8929 Other chronic pain: Secondary | ICD-10-CM

## 2023-02-04 MED ORDER — TRAMADOL HCL 50 MG PO TABS: 50.0000 mg | ORAL_TABLET | Freq: Three times a day (TID) | ORAL | 0 refills | Status: DC | PRN

## 2023-02-05 ENCOUNTER — Other Ambulatory Visit: Payer: Self-pay | Admitting: Family Medicine

## 2023-02-05 DIAGNOSIS — I10 Essential (primary) hypertension: Secondary | ICD-10-CM

## 2023-02-18 ENCOUNTER — Other Ambulatory Visit: Payer: Self-pay

## 2023-02-24 ENCOUNTER — Other Ambulatory Visit: Payer: Self-pay

## 2023-02-24 NOTE — Progress Notes (Signed)
Specialty Pharmacy Refill Coordination Note  Courtney Hamilton is a 62 y.o. female contacted today regarding refills of specialty medication(s) Dolutegravir Sodium (Tivicay); Emtricitabine-Tenofovir AF (Descovy)   Patient requested Delivery   Delivery date: 03/05/23   Verified address: [address] [place] [address]   Medication will be filled on 12.26.24.

## 2023-03-04 ENCOUNTER — Ambulatory Visit: Payer: 59 | Admitting: Family Medicine

## 2023-03-04 ENCOUNTER — Encounter: Payer: Self-pay | Admitting: Family Medicine

## 2023-03-04 VITALS — BP 135/80 | HR 55 | Wt 196.2 lb

## 2023-03-04 DIAGNOSIS — F5101 Primary insomnia: Secondary | ICD-10-CM

## 2023-03-04 DIAGNOSIS — K625 Hemorrhage of anus and rectum: Secondary | ICD-10-CM

## 2023-03-04 DIAGNOSIS — M5441 Lumbago with sciatica, right side: Secondary | ICD-10-CM

## 2023-03-04 DIAGNOSIS — G8929 Other chronic pain: Secondary | ICD-10-CM | POA: Diagnosis not present

## 2023-03-04 MED ORDER — HYDROCORTISONE ACETATE 25 MG RE SUPP: 25.0000 mg | Freq: Two times a day (BID) | RECTAL | 1 refills | Status: DC

## 2023-03-04 MED ORDER — GABAPENTIN 300 MG PO CAPS: 300.0000 mg | ORAL_CAPSULE | Freq: Four times a day (QID) | ORAL | 1 refills | Status: DC | PRN

## 2023-03-04 MED ORDER — TRAMADOL HCL 50 MG PO TABS: 50.0000 mg | ORAL_TABLET | Freq: Three times a day (TID) | ORAL | 0 refills | Status: DC | PRN

## 2023-03-04 NOTE — Assessment & Plan Note (Signed)
Chronic pain worsened over past week. Increased Gabapentin to 1200mg  total. In order to also improve sleep, recommended taking 900mg  at night and 300mg  in the morning. Patient agreeable to plan. Refilled Tramadol. Previous imaging done at outside facility. Patient agreeable to lumbar MRI, to evaluate for new change given slowly worsening symptoms.

## 2023-03-04 NOTE — Progress Notes (Signed)
    SUBJECTIVE:   CHIEF COMPLAINT / HPI: follow-up  Sciatica - Currently taking Gabapentin and Tramadol once together as needed when she has pain. Then is taking X2 Gabapentin at night to help sleep. Is taking Tramadol 3 times per day.  Sleep - Feels a large part of her mood is due to lack of sleep. Previously discussed having poor sleep hygiene. Stays up very late, has difficulty falling asleep.   Blood in stool - Notes she had blood in stool on Saturday. Just a little bit. No other instances. Note she had hemorrhoid on last colonoscopy. Feels like she is having to strain with bowel movements.   PERTINENT  PMH / PSH: HTN, COPD with Asthma, HIV  OBJECTIVE:   BP 135/80   Pulse (!) 55   Wt 196 lb 3.2 oz (89 kg)   SpO2 99%   BMI 31.67 kg/m   General: NAD, well appearing Neuro: A&O Respiratory: normal WOB on RA Extremities: Moving all 4 extremities equally MSK: straight leg raise positive on right side  ASSESSMENT/PLAN:   Assessment & Plan Chronic bilateral low back pain with right-sided sciatica Chronic pain worsened over past week. Increased Gabapentin to 1200mg  total. In order to also improve sleep, recommended taking 900mg  at night and 300mg  in the morning. Patient agreeable to plan. Refilled Tramadol. Previous imaging done at outside facility. Patient agreeable to lumbar MRI, to evaluate for new change given slowly worsening symptoms. Primary insomnia Discussed good sleep hygiene. Increase Gabapentin as above. May benefit from sleep study in the future. Would avoid medical sleep aids if possible. Bright red blood per rectum Suspect this is due to hemorrhoids given symptoms and previous colonoscopy. However cannot rule out new colon. Discussed risk and benefit with patients of repeat colonoscopy. Patient elected to observe for now. Counseled if she has repeat episode to message in [place] for referral to GI. Anusol suppository as needed for hemorrhoids. Patient agreeable to  plan.  Return in about 4 weeks (around 04/01/2023).  Celine Mans, MD [facility]

## 2023-03-04 NOTE — Assessment & Plan Note (Signed)
Discussed good sleep hygiene. Increase Gabapentin as above. May benefit from sleep study in the future. Would avoid medical sleep aids if possible.

## 2023-03-04 NOTE — Patient Instructions (Addendum)
It was great to see you! Thank you for allowing me to participate in your care!  Our plans for today:  - Message me on mychart if you have another episode of blood in your stool. - Please work on going to sleep at the same time daily. - I have increased your Gabapentin to a total of 1200mg  per day. Take 900mg  daily at night and 300mg  in the morning. - You should receive a call to schedule your MRI.   Please arrive 15 minutes PRIOR to your next scheduled appointment time! If you do not, this affects OTHER patients' care.  Take care and seek immediate care sooner if you develop any concerns.   Celine Mans, MD, PGY-2 [facility] 2:24 PM 03/04/2023  [facility]    Therapy and Counseling Resources Most providers on this list will take Medicaid. Patients with commercial insurance or Medicare should contact their insurance company to get a list of in network providers.  [facility] (takes children) Location 1: [address] [place], [place] [address] Location 2: [address] [place], [place] [address] [phone]   [facility] (spanish speaking therapist available)(habla espanol)(take medicare and medicaid)  [address], [place], [place] [address], [email or web address] [email or web address] [phone]  [facility]iatry and Counseling [address] [place], [place] [address] [phone]  [facility]   [address] Suite [place], [place] [address]      [phone]  [facility] (spanish available) [address]  [place], [place] [address] [phone]  [facility] (take [facility] and medicare) [address], [address]  [place], [place] [address]       [phone]     [facility] (virtual only) [phone]  Jovita Kussmaul [facility] [address], [place], [place] [phone]  [facility]:  [address] [place]  [phone]  [facility]:  [address] [place] [phone]  [facility] (under & uninsured) [address]   [place] [phone]    [email or web address]    [facility] [address]  [place]    [phone]  [facility] -[address]     Phone:  [phone]     [facility]-  [address]  [phone]   [facility] [address]      [place], [place] [phone]  [facility]: [address] [place], [place], [place]  [phone]   [facility] (Spanish therapist) [email or web address]  [address] [place]  [address]   [place] [address]    [phone]    [facility]   [address],  [place], [place]  [phone]   [facility]  [address] [place]  [phone]  [facility]  [address] [place], [place]        [phone]  Open Access/Walk In Clinic under & uninsured  [facility]  [address] [place], [place] [phone] Crisis [phone]  [facility] [place],  (Spanish)   [address], [place]: [phone]) 8:30 - 12; 1 - 2:30  [facility] [employer],  [address] [place], [place]    [phone]):8:30 - 12; 2 - 3PM  [facility] [employer],  [address],  [place]; [phone]):  Mon - Fri 8 AM - 5 PM  [facility] [address] [place]  MWF 12:30 to 3:00 or call to schedule an appointment  [phone]  Specific Provider options Psychology Today  [email or web address] click on find a therapist  enter your zip code left side and select or tailor a therapist for your specific need.   [facility] Provider Directory [email or web address]  (Medicaid)   Follow all drop  down to find a provider  Social Support program [facility] [place] [phone] or PhotoSolver.pl [address], [place], [place] Recovery support and educational   24- Hour Availability:   [facility]  [address] [place], [place] [phone] Crisis [phone]  [facility] [phone]  [facility]  [phone]   [facility]  [phone] (after hours)  Therapeutic Alternative/Mobile Crisis   [phone]  [place] National Suicide Hotline  [phone] Len Childs)  Call 911 or go to emergency room  [facility]  [phone]);  [place] and [employer]  [phone]); [place], [place], [place], [place], [place], [place], [place]

## 2023-03-12 ENCOUNTER — Ambulatory Visit (INDEPENDENT_AMBULATORY_CARE_PROVIDER_SITE_OTHER): Payer: 59 | Admitting: Family Medicine

## 2023-03-12 ENCOUNTER — Encounter: Payer: Self-pay | Admitting: Family Medicine

## 2023-03-12 VITALS — BP 134/76 | HR 60 | Temp 98.5°F | Ht 66.0 in | Wt 194.0 lb

## 2023-03-12 DIAGNOSIS — J069 Acute upper respiratory infection, unspecified: Secondary | ICD-10-CM | POA: Insufficient documentation

## 2023-03-12 DIAGNOSIS — R051 Acute cough: Secondary | ICD-10-CM | POA: Diagnosis not present

## 2023-03-12 MED ORDER — BENZONATATE 100 MG PO CAPS: 100.0000 mg | ORAL_CAPSULE | Freq: Two times a day (BID) | ORAL | 0 refills | Status: DC | PRN

## 2023-03-12 NOTE — Patient Instructions (Signed)
 Dear Courtney Hamilton  Today we discussed the following concerns and plans:  Recent illness with cough - I suspect this is a viral upper respiratory tract infection.  Your cough will probably last for a few more weeks. - Continue your supportive care at home with Alka-Seltzer, vitamin C, Tylenol /ibuprofen  as needed.  Tea with honey is also a great idea. - I am sending in Tessalon  Perles to your pharmacy for cough.  You can take this twice a day as needed.  If you develop fever, shortness of breath, or begin to feel worse please return to clinic or present to the emergency room for further evaluation.  If you have any concerns, please call the clinic or schedule an appointment.  It was a pleasure to take care of you today. Be well!  Lauraine Norse, DO  Family Medicine, PGY-1

## 2023-03-12 NOTE — Progress Notes (Signed)
    SUBJECTIVE:   CHIEF COMPLAINT / HPI:   Cough with green phlegm 5 days ago pt felt achy; flu-like symptoms with sinus pressure and HA. She used nasal spray with some relief. Did have one episode of green nasal discharge streaked with small amount of blood. Sinus pressure has improved somewhat, but now it is in my chest and pt reports she is coughing frequently, making sleep difficult. Never any fevers with this. Decreased energy, appetite, staying well hydrated. Some nausea, no vomiting.  Has tried alka seltzer, vitamin C for supportive care. Has used albuterol  inhaler 1-2 times with relief.  PERTINENT  PMH / PSH:  COPD with asthma Hx of PNA HTN HIV well controlled  OBJECTIVE:   BP 134/76   Pulse 60   Temp 98.5 F (36.9 C) (Oral)   Ht 5' 6 (1.676 m)   Wt 194 lb (88 kg)   SpO2 99%   BMI 31.31 kg/m   General: Well-appearing, pleasant, no acute distress. Cardio: Regular rate, regular rhythm, no murmurs on exam. Pulm: Clear, no wheezing, no crackles. No increased work of breathing.  Speaks in full sentences without difficulty.   ASSESSMENT/PLAN:   Viral URI with cough Suspect viral etiology based on patient course of symptoms, and no history of fever.  Very low concern for bacterial pneumonia especially given patient's clear lung exam.  Additionally her HIV is well-controlled.  -Patient would be outside of the window for viral treatment so will not swab for flu/COVID - Continue supportive care with Alka-Seltzer, tea with honey, Tylenol /ibuprofen , etc. -Sent in Tessalon  Perles BID for cough; if insurance does not cover this patient may take Robitussin OTC -Return precautions given should patient develop fever, shortness of breath, or other worsening symptoms.     Lauraine Norse, DO [facility]

## 2023-03-12 NOTE — Assessment & Plan Note (Signed)
 Suspect viral etiology based on patient course of symptoms, and no history of fever.  Very low concern for bacterial pneumonia especially given patient's clear lung exam.  Additionally her HIV is well-controlled.  -Patient would be outside of the window for viral treatment so will not swab for flu/COVID - Continue supportive care with Alka-Seltzer, tea with honey, Tylenol /ibuprofen , etc. -Sent in Tessalon  Perles BID for cough; if insurance does not cover this patient may take Robitussin OTC -Return precautions given should patient develop fever, shortness of breath, or other worsening symptoms.

## 2023-03-31 ENCOUNTER — Other Ambulatory Visit: Payer: Self-pay

## 2023-03-31 ENCOUNTER — Other Ambulatory Visit: Payer: Self-pay | Admitting: Family Medicine

## 2023-03-31 DIAGNOSIS — G8929 Other chronic pain: Secondary | ICD-10-CM

## 2023-03-31 NOTE — Progress Notes (Signed)
Specialty Pharmacy Refill Coordination Note  Genelle Andrews is a 63 y.o. female contacted today regarding refills of specialty medication(s) Dolutegravir Sodium (Tivicay); Emtricitabine-Tenofovir AF (Descovy)   Patient requested Delivery   Delivery date: 04/08/23   Verified address: [address] [place] [address]   Medication will be filled on 01.29.25.

## 2023-04-01 ENCOUNTER — Encounter: Payer: Self-pay | Admitting: Family Medicine

## 2023-04-01 DIAGNOSIS — R7303 Prediabetes: Secondary | ICD-10-CM | POA: Insufficient documentation

## 2023-04-01 MED ORDER — TRAMADOL HCL 50 MG PO TABS: 50.0000 mg | ORAL_TABLET | Freq: Three times a day (TID) | ORAL | 0 refills | Status: DC | PRN

## 2023-04-20 ENCOUNTER — Other Ambulatory Visit: Payer: Self-pay

## 2023-04-26 ENCOUNTER — Encounter: Payer: Self-pay | Admitting: Family Medicine

## 2023-04-26 ENCOUNTER — Ambulatory Visit (INDEPENDENT_AMBULATORY_CARE_PROVIDER_SITE_OTHER): Payer: 59 | Admitting: Family Medicine

## 2023-04-26 ENCOUNTER — Other Ambulatory Visit: Payer: Self-pay | Admitting: Family Medicine

## 2023-04-26 VITALS — BP 140/90 | HR 62 | Ht 66.0 in | Wt 191.0 lb

## 2023-04-26 DIAGNOSIS — I10 Essential (primary) hypertension: Secondary | ICD-10-CM | POA: Diagnosis not present

## 2023-04-26 DIAGNOSIS — G8929 Other chronic pain: Secondary | ICD-10-CM | POA: Diagnosis not present

## 2023-04-26 DIAGNOSIS — R7303 Prediabetes: Secondary | ICD-10-CM | POA: Diagnosis not present

## 2023-04-26 DIAGNOSIS — E669 Obesity, unspecified: Secondary | ICD-10-CM

## 2023-04-26 DIAGNOSIS — M5441 Lumbago with sciatica, right side: Secondary | ICD-10-CM | POA: Diagnosis not present

## 2023-04-26 DIAGNOSIS — F5101 Primary insomnia: Secondary | ICD-10-CM

## 2023-04-26 DIAGNOSIS — G620 Drug-induced polyneuropathy: Secondary | ICD-10-CM

## 2023-04-26 LAB — POCT GLYCOSYLATED HEMOGLOBIN (HGB A1C): HbA1c, POC (prediabetic range): 6.1 % (ref 5.7–6.4)

## 2023-04-26 MED ORDER — GABAPENTIN 300 MG PO CAPS: 300.0000 mg | ORAL_CAPSULE | Freq: Three times a day (TID) | ORAL | 1 refills | Status: DC | PRN

## 2023-04-26 MED ORDER — LOSARTAN POTASSIUM 50 MG PO TABS: 50.0000 mg | ORAL_TABLET | Freq: Every day | ORAL | 1 refills | Status: DC

## 2023-04-26 MED ORDER — WEGOVY 0.25 MG/0.5ML ~~LOC~~ SOAJ: 0.2500 mg | SUBCUTANEOUS | 1 refills | Status: DC

## 2023-04-26 NOTE — Assessment & Plan Note (Signed)
 Previously discussed poor sleep hygiene. Discussed medications like Ambien will not help long term and are not appropriate. Offered Sleep study versus referral to sleep medicine. Patient declined both, would prefer to reduce dose of Gabapentin as below and continue to work on her sleep hygiene.

## 2023-04-26 NOTE — Assessment & Plan Note (Addendum)
 Have discussed various nutritional SMART goals over the past year without decrease in weight. Patient would like to start GLP-1. Will trial Wegovy. Discussed potential side effects. -0.25mg  Wegovy weekly injections

## 2023-04-26 NOTE — Patient Instructions (Addendum)
 It was great to see you! Thank you for allowing me to participate in your care!  Our plans for today:  - Please start taking Wegovy. This is a once weekly injection. - Let me know how you are doing after 1 month, before we increase the dose. - Please increase you Losartan to 50mg  daily. - Please measure at home, and I will see you again in 2 weeks.   Please arrive 15 minutes PRIOR to your next scheduled appointment time! If you do not, this affects OTHER patients' care.  Take care and seek immediate care sooner if you develop any concerns.   Celine Mans, MD, PGY-2 [facility] 11:34 AM 04/26/2023  [facility]

## 2023-04-26 NOTE — Progress Notes (Signed)
    SUBJECTIVE:   CHIEF COMPLAINT / HPI: medication issue  Here today to discuss GLP1s. Has not had GLP1s.  Was very sleepy with new Gabapentin dose. Does not want to take higher dose. Stopped taking.  Made too sleepy with daytime fatigue No known history of pancreatitis  Reports BP has also been high. Took 2 doses of Losartan 25mg .  Had previously not increased because of symptomatic bradycardia and ED visit  Denies excessive day time fatigue. Does snore at night. Husband has not noticed any apneic episodes. Still has trouble falling asleep and then wakes up unable to go back to sleep.  PERTINENT  PMH / PSH: HTN, COPD with asthma, Osteoporosis  OBJECTIVE:   BP (!) 140/90   Pulse 62   Ht 5\' 6"  (1.676 m)   Wt 191 lb (86.6 kg)   SpO2 100%   BMI 30.83 kg/m   General: NAD, well appearing Neuro: A&O Respiratory: normal WOB on RA Extremities: Moving all 4 extremities equally   ASSESSMENT/PLAN:   Assessment & Plan Prediabetes Repeat A1c today slight worsening to 6.1. Start GLP-1 as below. Obesity (BMI 30-39.9) Have discussed various nutritional SMART goals over the past year without decrease in weight. Patient would like to start GLP-1. Will trial Wegovy. Discussed potential side effects. -0.25mg  Wegovy weekly injections Primary insomnia Previously discussed poor sleep hygiene. Discussed medications like Ambien will not help long term and are not appropriate. Offered Sleep study versus referral to sleep medicine. Patient declined both, would prefer to reduce dose of Gabapentin as below and continue to work on her sleep hygiene. Essential hypertension Uncontrolled, elevated x2 today in clinic. Increase Losartan to 50mg . Follow-up 2 weeks for BMP.  Chronic bilateral low back pain with right-sided sciatica Gabapentin dose decrease back to 600mg  at night, and 300mg  during the day.   Precepted with Dr. Yves Dill  Return in about 2 weeks (around 05/10/2023).  Celine Mans, MD [facility]

## 2023-04-26 NOTE — Assessment & Plan Note (Signed)
 Repeat A1c today slight worsening to 6.1. Start GLP-1 as below.

## 2023-04-26 NOTE — Assessment & Plan Note (Signed)
 Uncontrolled, elevated x2 today in clinic. Increase Losartan to 50mg . Follow-up 2 weeks for BMP.

## 2023-04-26 NOTE — Assessment & Plan Note (Signed)
 Gabapentin dose decrease back to 600mg  at night, and 300mg  during the day.

## 2023-04-27 ENCOUNTER — Encounter: Payer: Self-pay | Admitting: Family Medicine

## 2023-04-27 NOTE — Telephone Encounter (Signed)
 Called pharmacy. Pharmacist reports that this medication is not covered by patient's insurance plan.   No option for PA.   Will forward to PCP for further advisement.   Veronda Prude, RN

## 2023-04-28 ENCOUNTER — Other Ambulatory Visit (HOSPITAL_COMMUNITY): Payer: Self-pay

## 2023-04-29 NOTE — Telephone Encounter (Signed)
 Called patient. Patient reports that she gave Dr. Velna Ochs the remainder of gabapentin 300 mg at office visit on Monday, 04/26/23, which is why she is out of medication.   She is asking for 100 mg dosage as she felt that the 300 mg was too strong. Also, insurance will not cover new prescription given the date of last fill of 300 mg.   Will forward to prescriber.   Veronda Prude, RN

## 2023-04-30 ENCOUNTER — Other Ambulatory Visit: Payer: Self-pay

## 2023-04-30 MED ORDER — GABAPENTIN 100 MG PO CAPS: 100.0000 mg | ORAL_CAPSULE | Freq: Three times a day (TID) | ORAL | 3 refills | Status: DC

## 2023-04-30 NOTE — Progress Notes (Signed)
 Specialty Pharmacy Refill Coordination Note  Courtney Hamilton is a 63 y.o. female contacted today regarding refills of specialty medication(s) Dolutegravir Sodium (Tivicay); Emtricitabine-Tenofovir AF (Descovy)   Patient requested Delivery   Delivery date: 05/06/23   Verified address: [address] [place]  [address]   Medication will be filled on 02.26.25.

## 2023-05-03 MED ORDER — SEMAGLUTIDE(0.25 OR 0.5MG/DOS) 2 MG/1.5ML ~~LOC~~ SOPN: 0.2500 mg | PEN_INJECTOR | SUBCUTANEOUS | 0 refills | Status: DC

## 2023-05-03 MED ORDER — TRAMADOL HCL 50 MG PO TABS: 50.0000 mg | ORAL_TABLET | Freq: Three times a day (TID) | ORAL | 0 refills | Status: DC | PRN

## 2023-05-03 NOTE — Addendum Note (Signed)
 Addended by: Celine Mans on: 05/03/2023 04:20 PM   Modules accepted: Orders

## 2023-05-05 ENCOUNTER — Telehealth: Payer: Self-pay

## 2023-05-05 NOTE — Telephone Encounter (Signed)
 Rec'd PA request for Ozempic medication.   Medication for dx of diabetes.

## 2023-05-15 ENCOUNTER — Other Ambulatory Visit: Payer: Self-pay | Admitting: Family Medicine

## 2023-05-15 DIAGNOSIS — J4489 Other specified chronic obstructive pulmonary disease: Secondary | ICD-10-CM

## 2023-05-15 DIAGNOSIS — R058 Other specified cough: Secondary | ICD-10-CM

## 2023-05-15 DIAGNOSIS — G8929 Other chronic pain: Secondary | ICD-10-CM

## 2023-05-18 ENCOUNTER — Other Ambulatory Visit: Payer: Self-pay | Admitting: Family Medicine

## 2023-05-18 DIAGNOSIS — Z1231 Encounter for screening mammogram for malignant neoplasm of breast: Secondary | ICD-10-CM

## 2023-05-24 ENCOUNTER — Ambulatory Visit: Payer: 59 | Admitting: Family Medicine

## 2023-05-24 DIAGNOSIS — K5790 Diverticulosis of intestine, part unspecified, without perforation or abscess without bleeding: Secondary | ICD-10-CM | POA: Insufficient documentation

## 2023-05-24 DIAGNOSIS — K921 Melena: Secondary | ICD-10-CM | POA: Insufficient documentation

## 2023-05-24 NOTE — Progress Notes (Deleted)
    SUBJECTIVE:   CHIEF COMPLAINT / HPI: here for shoulder injection  ***  PERTINENT  PMH / PSH: HTN, COPD with asthma, Osteoporosis, HIV  OBJECTIVE:   There were no vitals taken for this visit.  ***  ASSESSMENT/PLAN:   Assessment & Plan  No follow-ups on file.  PROCEDURE: INJECTION: Patient was given informed consent, signed copy in the chart. Appropriate time out was taken. Area prepped and draped in usual sterile fashion. Ethyl chloride was  used for local anesthesia. A 21 gauge 1 1/2 inch needle was used.. *** cc of methylprednisolone 40 mg/ml plus  *** cc of 1% lidocaine without epinephrine was injected into the *** using a(n) *** approach.   The patient tolerated the procedure well. There were no complications. Post procedure instructions were given.  Celine Mans, MD [facility]

## 2023-05-27 ENCOUNTER — Other Ambulatory Visit: Payer: Self-pay

## 2023-05-28 ENCOUNTER — Other Ambulatory Visit: Payer: Self-pay

## 2023-05-28 ENCOUNTER — Other Ambulatory Visit: Payer: Self-pay | Admitting: Internal Medicine

## 2023-05-28 ENCOUNTER — Other Ambulatory Visit (HOSPITAL_COMMUNITY): Payer: Self-pay

## 2023-05-28 DIAGNOSIS — Z21 Asymptomatic human immunodeficiency virus [HIV] infection status: Secondary | ICD-10-CM

## 2023-05-28 MED ORDER — DESCOVY 200-25 MG PO TABS
1.0000 | ORAL_TABLET | Freq: Every day | ORAL | 2 refills | Status: DC
  Filled 2023-05-28: qty 30, 30d supply, fill #0
  Filled 2023-06-30 – 2023-07-05 (×2): qty 30, 30d supply, fill #1
  Filled 2023-07-23: qty 30, 30d supply, fill #2

## 2023-05-28 MED ORDER — TIVICAY 50 MG PO TABS
50.0000 mg | ORAL_TABLET | Freq: Every day | ORAL | 2 refills | Status: DC
  Filled 2023-05-28: qty 30, 30d supply, fill #0
  Filled 2023-06-30 – 2023-07-05 (×2): qty 30, 30d supply, fill #1
  Filled 2023-07-23: qty 30, 30d supply, fill #2

## 2023-05-28 NOTE — Progress Notes (Signed)
 Specialty Pharmacy Refill Coordination Note  Courtney Hamilton is a 63 y.o. female contacted today regarding refills of specialty medication(s) Dolutegravir Sodium (Tivicay); Emtricitabine-Tenofovir AF (Descovy)   Patient requested (Patient-Rptd) Delivery   Delivery date: (Patient-Rptd) 06/04/23   [address] [place] [address]   Medication will be filled on 03.27.25.   This fill date is pending response to refill request from provider. Patient is aware and if they have not received fill by intended date they must follow up with pharmacy.

## 2023-05-30 ENCOUNTER — Other Ambulatory Visit: Payer: Self-pay | Admitting: Family Medicine

## 2023-05-30 DIAGNOSIS — G8929 Other chronic pain: Secondary | ICD-10-CM

## 2023-05-31 ENCOUNTER — Ambulatory Visit: Admitting: Family Medicine

## 2023-06-01 ENCOUNTER — Other Ambulatory Visit: Payer: Self-pay | Admitting: Family Medicine

## 2023-06-01 DIAGNOSIS — I10 Essential (primary) hypertension: Secondary | ICD-10-CM

## 2023-06-01 MED ORDER — TRAMADOL HCL 50 MG PO TABS: 50.0000 mg | ORAL_TABLET | Freq: Three times a day (TID) | ORAL | 0 refills | Status: DC | PRN

## 2023-06-02 ENCOUNTER — Telehealth: Payer: Self-pay

## 2023-06-02 ENCOUNTER — Ambulatory Visit: Admitting: Student

## 2023-06-02 VITALS — BP 156/96 | HR 69 | Ht 66.0 in | Wt 194.6 lb

## 2023-06-02 DIAGNOSIS — R051 Acute cough: Secondary | ICD-10-CM | POA: Diagnosis not present

## 2023-06-02 MED ORDER — CEFDINIR 300 MG PO CAPS: 300.0000 mg | ORAL_CAPSULE | Freq: Two times a day (BID) | ORAL | 0 refills | Status: AC

## 2023-06-02 MED ORDER — DOXYCYCLINE HYCLATE 100 MG PO TABS: 100.0000 mg | ORAL_TABLET | Freq: Two times a day (BID) | ORAL | 0 refills | Status: AC

## 2023-06-02 NOTE — Telephone Encounter (Signed)
 Patient calls nurse line requesting a same day apt.   She reports on Monday she started coughing up thin white phlegm. She reports over the last couple of days she noticed back pain and labored breathing, more so with exertion. She was speaking in full sentences on the phone.   She denies any fevers or congestion. She reports a hx of PNA and would like evaluation as soon as possible. She reports an apt tomorrow with PCP, however for cortisone injection.  Patient scheduled for this morning for evaluation.

## 2023-06-02 NOTE — Progress Notes (Signed)
    SUBJECTIVE:   CHIEF COMPLAINT / HPI:   Cough  Shortness of Breath: The patient, with a history of COPD, presents with symptoms suggestive of pneumonia that started a few days ago. She initially experienced a dry cough over the weekend, which progressed to a productive cough with fluid-like, white sputum. Accompanying symptoms include body aches and a severe headache. The patient reports worsening shortness of breath, which was not as severe the previous day. She denies being around anyone who is sick but mentions attending a cheerleading event. The patient has been adherent to her COPD medication, Breztri. She also reports having chills and a possible low-grade fever. The patient has been managing symptoms at home with over-the-counter medications and rest. She also reports a backache, which is more pronounced in the middle of the back.  HIV patient on treatment and follows with ID.   PERTINENT  PMH / PSH: Reviewed   OBJECTIVE:   BP (!) 156/96   Pulse 69   Ht 5\' 6"  (1.676 m)   Wt 194 lb 9.6 oz (88.3 kg)   SpO2 100%   BMI 31.41 kg/m   General: Alert and oriented in no apparent distress Heart: Regular rate and rhythm with no murmurs appreciated Lungs: RLL crackles, diffuse expiratory wheeze, Normal WOB Abdomen: no abdominal pain Skin: Warm and dry  ASSESSMENT/PLAN:   Assessment & Plan Acute cough Symptoms of PNA and COPD increase risk of complications with history of HIV. Imaging needed as well. Cefdinir & Doxy selected for outpatient treatment. Patient had no dyspnea or swelling from the doxy in the past, small rash (drug eruption) several years ago and is willing to try again.  - Order chest X-ray. - Prescribe Cefdinir 300 mg every 12 hours for 10 days. - Rx doxy w/ food  - Advise rest and symptom monitoring. - Instruct to call if symptoms worsen or do not improve-prednisone burst if not improving     Elevated BP in acute illness, recheck at next visit.   Alfredo Martinez,  MD [facility]

## 2023-06-02 NOTE — Patient Instructions (Addendum)
 It was great to see you today! Thank you for choosing [facility] for your primary care.  Today we addressed: - Doxycycline with food twice daily for 5 days  - Cefdinir twice daily for 7 days  - Chest xray at [address] - Call if worsening over next couple of days   If you haven't already, sign up for My Chart to have easy access to your labs results, and communication with your primary care physician.   Please arrive 15 minutes before your appointment to ensure smooth check in process.  We appreciate your efforts in making this happen.  Thank you for allowing me to participate in your care, Alfredo Martinez, MD 06/02/2023, 12:02 PM PGY-3, [facility]

## 2023-06-03 ENCOUNTER — Ambulatory Visit: Admitting: Family Medicine

## 2023-06-03 ENCOUNTER — Other Ambulatory Visit: Payer: Self-pay | Admitting: Internal Medicine

## 2023-06-03 NOTE — Progress Notes (Deleted)
    SUBJECTIVE:   CHIEF COMPLAINT / HPI: here for shoulder injection  ***  PERTINENT  PMH / PSH: HTN, COPD with asthma, Osteoporosis, HIV  OBJECTIVE:   There were no vitals taken for this visit.  ***  ASSESSMENT/PLAN:   Assessment & Plan  No follow-ups on file.  PROCEDURE: INJECTION: Patient was given informed consent, signed copy in the chart. Appropriate time out was taken. Area prepped and draped in usual sterile fashion. Ethyl chloride was  used for local anesthesia. A 21 gauge 1 1/2 inch needle was used.. *** cc of methylprednisolone 40 mg/ml plus  *** cc of 1% lidocaine without epinephrine was injected into the *** using a(n) *** approach.   The patient tolerated the procedure well. There were no complications. Post procedure instructions were given.  Celine Mans, MD [facility]

## 2023-06-18 ENCOUNTER — Other Ambulatory Visit: Payer: Self-pay | Admitting: Family Medicine

## 2023-06-18 DIAGNOSIS — I10 Essential (primary) hypertension: Secondary | ICD-10-CM

## 2023-06-22 ENCOUNTER — Ambulatory Visit (INDEPENDENT_AMBULATORY_CARE_PROVIDER_SITE_OTHER): Admitting: Student

## 2023-06-22 VITALS — BP 131/76 | HR 53 | Ht 66.0 in | Wt 192.4 lb

## 2023-06-22 DIAGNOSIS — M6289 Other specified disorders of muscle: Secondary | ICD-10-CM | POA: Diagnosis not present

## 2023-06-22 DIAGNOSIS — M7521 Bicipital tendinitis, right shoulder: Secondary | ICD-10-CM | POA: Diagnosis not present

## 2023-06-22 MED ORDER — TRIAMCINOLONE ACETONIDE 40 MG/ML IJ SUSP
40.0000 mg | Freq: Once | INTRAMUSCULAR | Status: AC
  Administered 2023-06-22: 40 mg via INTRAMUSCULAR

## 2023-06-22 NOTE — Addendum Note (Signed)
 Addended by: Saliyah Gillin on: 06/22/2023 01:40 PM   Modules accepted: Orders

## 2023-06-22 NOTE — Patient Instructions (Addendum)
 It was great to see you today! Thank you for choosing [facility] for your primary care.  Today we addressed: Today you received an injection with corticosteroid. This injection is usually done in response to pain and inflammation. There is some "numbing" medicine also in the shot so the injected area may be numb and feel really good for the next couple of hours. The numbing medicine usually wears off in 2-3 hours though, and then your pain level will be right back where it was before the injection.    The actually benefit from the steroid injection is usually noticed in 2-7 days. You may actually experience a small (as in 10%) INCREASE in pain in the first 24 hours---that is common.    Things to watch out for that you should contact us  or a health care provider urgently would include: 1. Unusual (as in more than 10%) increase in pain 2. New fever > 101.5 3. New swelling or redness of the injected area.  4. Streaking of red lines around the area injected.   We have sent a referral to physical therapy as well   If you haven't already, sign up for My Chart to have easy access to your labs results, and communication with your primary care physician.   Please arrive 15 minutes before your appointment to ensure smooth check in process.  We appreciate your efforts in making this happen.  Thank you for allowing me to participate in your care, Ernestina [place], MD 06/22/2023, 11:01 AM PGY-3, [facility]

## 2023-06-22 NOTE — Assessment & Plan Note (Signed)
 Steroid injection given again today, discussed after care and return precautions. Tolerated well. PT ordered again today.

## 2023-06-22 NOTE — Progress Notes (Signed)
    SUBJECTIVE:   CHIEF COMPLAINT / HPI:   Cortisone Injection:  - previously performed with Dr Irby Mannan 11/2022, right shoulder  - greatly improved after this injection until a couple of weeks ago  - XR right shoulder no acute findings from 11/2022 - wants to continue with PT - HIV patient well controlled on antiretrovirals  - Reports of hamstring tightness and wants PT for this   PERTINENT  PMH / PSH: Reviewed   OBJECTIVE:   BP 131/76   Pulse (!) 53   Ht 5\' 6"  (1.676 m)   Wt 192 lb 6 oz (87.3 kg)   SpO2 100%   BMI 31.05 kg/m   General: NAD, pleasant, able to participate in exam Card: rrr Respiratory: No respiratory distress Skin: warm and dry, no rashes noted Psych: Normal affect and mood Right Shoulder: no TTP, good active ROM, no bony abnormalities   PROCEDURE: INJECTION: Patient was given informed consent, signed copy in the chart. Appropriate time out was taken. Area prepped and draped in usual sterile fashion. Ethyl chloride was used for local anesthesia. A 25 gauge 1 1/2 inch needle was used. 1 cc of triamcinolone 40 mg/ml plus 4 cc of 1% lidocaine without epinephrine was injected into the right subacromial space using a posterior lateral approach. Tolerated well by patient.   ASSESSMENT/PLAN:   Assessment & Plan Biceps tendinitis of right shoulder Steroid injection given again today, discussed after care and return precautions. Tolerated well. PT ordered again today.  Hamstring tightness PT ordered      Ernestina Headland, MD [facility]

## 2023-06-25 ENCOUNTER — Other Ambulatory Visit: Payer: Self-pay

## 2023-06-28 ENCOUNTER — Other Ambulatory Visit: Payer: Self-pay | Admitting: Family Medicine

## 2023-06-28 DIAGNOSIS — G8929 Other chronic pain: Secondary | ICD-10-CM

## 2023-06-29 ENCOUNTER — Encounter: Payer: Self-pay | Admitting: Family Medicine

## 2023-06-29 MED ORDER — TRAMADOL HCL 50 MG PO TABS: 50.0000 mg | ORAL_TABLET | Freq: Three times a day (TID) | ORAL | 0 refills | Status: AC | PRN

## 2023-06-30 ENCOUNTER — Other Ambulatory Visit: Payer: Self-pay

## 2023-06-30 NOTE — Progress Notes (Signed)
 Specialty Pharmacy Refill Coordination Note  Courtney Hamilton is a 63 y.o. female contacted today regarding refills of specialty medication(s) Dolutegravir  Sodium (Tivicay ); Emtricitabine -Tenofovir  AF (Descovy )   Patient requested Delivery   Delivery date: 07/07/23   Verified address: [address]   [place] [id number]   Medication will be filled on 07/06/23.

## 2023-07-02 ENCOUNTER — Other Ambulatory Visit: Payer: Self-pay | Admitting: Pharmacist

## 2023-07-02 NOTE — Progress Notes (Signed)
 Specialty Pharmacy Ongoing Clinical Assessment Note  Courtney Hamilton is a 63 y.o. female who is being followed by the specialty pharmacy service for RxSp HIV   Patient's specialty medication(s) reviewed today: Dolutegravir  Sodium (Tivicay ); Emtricitabine -Tenofovir  AF (Descovy )   Missed doses in the last 4 weeks: 0   Patient/Caregiver did not have any additional questions or concerns.   Therapeutic benefit summary: Patient is achieving benefit   Adverse events/side effects summary: No adverse events/side effects   Patient's therapy is appropriate to: Continue    Goals Addressed             This Visit's Progress    Comply with lab assessments       Patient is on track. Patient will adhere to provider and/or lab appointments      Increase CD4 count until steady state       Patient is on track. Patient will maintain adherence         Follow up:  6 months  Sonya Duster Specialty Pharmacist

## 2023-07-05 ENCOUNTER — Other Ambulatory Visit (HOSPITAL_COMMUNITY): Payer: Self-pay

## 2023-07-05 ENCOUNTER — Other Ambulatory Visit: Payer: Self-pay

## 2023-07-05 NOTE — Progress Notes (Signed)
 Patient called in to the pharmacy to request a early pick up for today as they have a family emergency and must leave town. Verified that [facility] had the necessary stock to fill at store. Patient will be able to pick up their medications today.

## 2023-07-20 NOTE — Progress Notes (Signed)
 The 10-year ASCVD risk score (Arnett DK, et al., 2019) is: 10.6%   Values used to calculate the score:     Age: 63 years     Sex: Female     Is Non-Hispanic African American: Yes     Diabetic: No     Tobacco smoker: No     Systolic Blood Pressure: 131 mmHg     Is BP treated: Yes     HDL Cholesterol: 72 mg/dL     Total Cholesterol: 257 mg/dL  Arlon Bergamo, BSN, RN

## 2023-07-23 ENCOUNTER — Other Ambulatory Visit: Payer: Self-pay

## 2023-07-23 ENCOUNTER — Other Ambulatory Visit: Payer: Self-pay | Admitting: Pharmacy Technician

## 2023-07-23 NOTE — Progress Notes (Signed)
 Specialty Pharmacy Refill Coordination Note  Courtney Hamilton is a 63 y.o. female contacted today regarding refills of specialty medication(s) Dolutegravir  Sodium (Tivicay ); Emtricitabine -Tenofovir  AF (Descovy )   Patient requested (Patient-Rptd) Delivery   Delivery date: (Patient-Rptd) 08/03/23   [address] ([address]Rpt[address] [place] [address]   Medication will be filled on 07/30/23.

## 2023-07-30 ENCOUNTER — Other Ambulatory Visit: Payer: Self-pay

## 2023-08-01 ENCOUNTER — Encounter: Payer: Self-pay | Admitting: Family Medicine

## 2023-08-01 DIAGNOSIS — E669 Obesity, unspecified: Secondary | ICD-10-CM

## 2023-08-01 DIAGNOSIS — R7303 Prediabetes: Secondary | ICD-10-CM

## 2023-08-01 DIAGNOSIS — G8929 Other chronic pain: Secondary | ICD-10-CM

## 2023-08-03 MED ORDER — TRAMADOL HCL 50 MG PO TABS: 50.0000 mg | ORAL_TABLET | Freq: Three times a day (TID) | ORAL | 0 refills | Status: DC | PRN

## 2023-08-04 ENCOUNTER — Encounter: Payer: Self-pay | Admitting: Family Medicine

## 2023-08-04 ENCOUNTER — Ambulatory Visit: Admitting: Family Medicine

## 2023-08-04 VITALS — BP 141/89 | HR 55 | Ht 66.0 in | Wt 189.4 lb

## 2023-08-04 DIAGNOSIS — G8929 Other chronic pain: Secondary | ICD-10-CM

## 2023-08-04 DIAGNOSIS — F4320 Adjustment disorder, unspecified: Secondary | ICD-10-CM

## 2023-08-04 DIAGNOSIS — M5441 Lumbago with sciatica, right side: Secondary | ICD-10-CM

## 2023-08-04 DIAGNOSIS — Z1231 Encounter for screening mammogram for malignant neoplasm of breast: Secondary | ICD-10-CM

## 2023-08-04 MED ORDER — GABAPENTIN 100 MG PO CAPS: ORAL_CAPSULE | ORAL | 0 refills | Status: DC

## 2023-08-04 NOTE — Assessment & Plan Note (Signed)
 Continue gabapentin  with changes as listed above.  Continue tramadol , refilled recently.

## 2023-08-04 NOTE — Progress Notes (Signed)
    SUBJECTIVE:   CHIEF COMPLAINT / HPI: sleep/stress  Brother passed away due to medical complication last month. Has had difficulty sleeping over the past month. Has been going through stages of grief. Trying to support brother's widow. Does not have strong support system. Open to taking to therapist.  From medical perspective feels she has been doing better overall. Pain is doing well Needs refill of tramadol  for chronic back pain. Marriage is doing well. No thoughts of harm self or others.   PERTINENT  PMH / PSH: HTN, COPD with asthma, Osteoporosis   OBJECTIVE:   BP (!) 141/89   Pulse (!) 55   Ht 5\' 6"  (1.676 m)   Wt 189 lb 6.4 oz (85.9 kg)   SpO2 99%   BMI 30.57 kg/m   General: NAD, well appearing Neuro: A&O Respiratory: normal WOB on RA Extremities: Moving all 4 extremities equally   ASSESSMENT/PLAN:   Assessment & Plan Grief reaction Suspect the patient's grief over recent loss of family members primary contributor to acute on chronic sleep issues.  Discussed in detail that medication changes will likely not improve sleep.  Recommended she discuss her grief with counselor which patient is agreeable.  I provided resources for therapy.  Negative PHQ-9.  Also we discussed proper sleep hygiene.  Will trial increasing gabapentin  to 300 mg at night from her 100 mg to help in the short-term.  Follow-up 1 month.  Chronic bilateral low back pain with right-sided sciatica Continue gabapentin  with changes as listed above.  Continue tramadol , refilled recently. Breast cancer screening by mammogram Breast cancer screening due, ordered and discussed with patient.  Return in about 1 month (around 09/04/2023).  Ivin Marrow, MD [facility]

## 2023-08-04 NOTE — Patient Instructions (Signed)
 It was great to see you! Thank you for allowing me to participate in your care!  Our plans for today:  - Please review the resources below and make an appointment with a therapist to discuss your recent loss and grief. - I have increased your gabapentin  at night to 300 mg daily. - You may continue to take melatonin as needed for sleep. - I have ordered your mammogram, you may go over to the breast center to have this done when you are able. - I will see you again in 1 month   Please arrive 15 minutes PRIOR to your next scheduled appointment time! If you do not, this affects OTHER patients' care.  Take care and seek immediate care sooner if you develop any concerns.   Ivin Marrow, MD, PGY-2 [facility] 11:21 AM 08/04/2023  [facility]    Therapy and Counseling Resources Most providers on this list will take Medicaid. Patients with commercial insurance or Medicare should contact their insurance company to get a list of in network providers.  [facility] (takes children) Location 1: [address] [place], [place] [address] Location 2: [address] [place], [place] [address] [phone]   [facility] (spanish speaking therapist available)(habla espanol)(take medicare and medicaid)  [address], [place], [place] [address], [place]  [email or web address] [phone]  [facility]Psychiatry and Counseling [address] [place], [place] [address] [phone]  [facility]   [address], Suite [place], [place] [address]      [phone]  [facility] (spanish available) [address]  [place], [place] [address] [phone]  [facility] (take [facility] and medicare) [address], [address]  [place], [place] [address]       [phone]     [facility] (virtual only) [phone]  [facility] [address] [place], [place]  [phone]  [facility]:  [address] [place] [phone]  [facility]:  [address] [place] [phone]  [facility] (under & uninsured) [address]   [place] [phone]    [email or web address]    [facility] [address]  [place]    [phone]  [facility] -[address]     Phone:  [phone]     [facility]-  [address]  [phone]   [facility] [address]      [place], [place] [phone]  [facility]: [address] [place], [place], [place]  [phone]   [facility] (Spanish therapist) [email or web address]  [address] [place]  [address]   [place] [address]    [phone]    [facility]   [address],  [place], [place]  [phone]   [facility]  [address] [place]  [phone]  [facility]  [address] [place], [place]        [phone]  Open Access/Walk In Clinic under & uninsured  [facility]  [address] [place], [place] [phone] Crisis [phone]  [facility] [address],  (Spanish)   [address] , [place]: [phone]) 8:30 - 12; 1 - 2:30  [facility] [employer],  [address], [place]    [phone]):8:30 - 12; 2 - 3PM  [facility] [employer],  [address],  [place]; [phone]):   Mon - Fri 8 AM - 5 PM  [facility] [address] [place]  MWF 12:30 to 3:00 or call to schedule an appointment  [phone]  Specific Provider options Psychology Today  https://www.psychologytoday.com/us  click on find a therapist  enter your zip code left side and select or tailor a therapist for your specific need.   [facility] Provider  Directory [email or web address]  (Medicaid)   Follow all drop down to find a provider  Social Support program [facility] [place] [phone] or PhotoS[employer].pl [address], [place], [place] Recovery support and educational   24- Hour Availability:   [facility]  [address] [place], [place] [phone] Crisis [phone]  [facility] [phone]  [place] [facility]  [phone]   [facility]  [phone] (after hours)  Therapeutic Alternative/Mobile Crisis   [phone]  [place]  National Suicide Hotline  (819)864-3375 Derrel Flies)  Call 911 or go to emergency room  [facility]  [phone]);  [place] and [employer]  [phone]); [place], [place], [place], [place], [place], [place], [place]

## 2023-08-06 ENCOUNTER — Ambulatory Visit

## 2023-08-06 ENCOUNTER — Other Ambulatory Visit: Payer: Self-pay | Admitting: Obstetrics

## 2023-08-06 DIAGNOSIS — N951 Menopausal and female climacteric states: Secondary | ICD-10-CM

## 2023-08-11 NOTE — Therapy (Deleted)
 OUTPATIENT PHYSICAL THERAPY LOWER EXTREMITY EVALUATION   Patient Name: Courtney Hamilton MRN: [id number] DOB:1960-04-06, 63 y.o., female Today's Date: 08/12/2023  END OF SESSION:   Past Medical History:  Diagnosis Date   Arthritis    right hip   Bronchitis last time  sept/oct 2021   Flu-like symptoms 05/25/2016   Fungal infection of skin of abdomen 06/16/2017   Hepatitis 1980's   NON A NON B tx for 1980's resolved   Herniated disc 10/07/2011   lower back   History of chronic kidney disease 1980's   resolved   HIV (human immunodeficiency virus infection) (HCC) 1985   Hypertension    Lower abdominal pain 10/08/2014   Lower GI bleed 05/10/2012   pt denies   Lower leg edema 12/10/2011   Neuropathy    resolved   Osteoporosis    Panic attacks 12/22/2013   Recurrent UTI 08/06/2011   Ruptured lumbar disc    Sciatica    right side   Substance abuse (HCC)    past hsitory  clean more than 20 years    TB (pulmonary tuberculosis) 1993   exposure, treated    Thickened endometrium    VIN III (vulvar intraepithelial neoplasia III)    Past Surgical History:  Procedure Laterality Date   APPENDECTOMY  1980's   BREAST EXCISIONAL BIOPSY Left 2001 per pt   cyst removed    CHOLECYSTECTOMY  1980's   CO2 LASER APPLICATION N/A 02/05/2020   Procedure: CO2 LASER APPLICATION OF VULVA (PERI-CLITORAL AND PERI-ANAL);  Surgeon: Suzi Essex, MD;  Location: [facility];  Service: Gynecology;  Laterality: N/A;   ECTOPIC PREGNANCY SURGERY  1985   injections to lower back  09/2017   LAPAROSCOPY N/A 07/03/2019   Procedure: LAPAROSCOPY DIAGNOSTIC; LYSIS OF ADHESIONS;  Surgeon: Suzi Essex, MD;  Location: [facility];  Service: Gynecology;  Laterality: N/A;   ROBOTIC ASSISTED TOTAL HYSTERECTOMY WITH BILATERAL SALPINGO OOPHERECTOMY N/A 07/03/2019   Procedure: XI ROBOTIC ASSISTED TOTAL HYSTERECTOMY WITH UNILATERAL SALPINGO OOPHORECTOMY;  Surgeon: Suzi Essex, MD;  Location:  [facility];  Service: Gynecology;  Laterality: N/A;   SALIVARY GLAND SURGERY  1990   VULVECTOMY N/A 01/18/2018   Procedure: PARTIAL VULVECTOMY;  Surgeon: Valeen Gartner, MD;  Location: [facility];  Service: Gynecology;  Laterality: N/A;   Patient Active Problem List   Diagnosis Date Noted   Hematochezia 05/24/2023   Diverticulosis 05/24/2023   Prediabetes 04/01/2023   Obesity (BMI 30-39.9) 12/22/2022   Biceps tendinitis of right shoulder 11/03/2022   Right rotator cuff tendinitis 06/30/2022   Overweight (BMI 25.0-29.9) 04/21/2022   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   Cervical stenosis (uterine cervix)    Essential hypertension 08/05/2017   Insomnia 11/05/2015   Former cigarette smoker 11/05/2015   Lumbosacral spondylosis without myelopathy 09/29/2013   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  Rationale for Evaluation and Treatment: Rehabilitation  ONSET DATE: ***  SUBJECTIVE:  SUBJECTIVE STATEMENT: ***  PERTINENT HISTORY: Cortisone Injection:  - previously performed with Dr Irby Mannan 11/2022, right shoulder  - greatly improved after this injection until a couple of weeks ago  - XR right shoulder no acute findings from 11/2022 - wants to continue with PT - HIV patient well controlled on antiretrovirals  - Reports of hamstring tightness and wants PT for  this  PAIN:  Are you having pain? Yes: NPRS scale: *** Pain location: *** Pain description: *** Aggravating factors: *** Relieving factors: ***  PRECAUTIONS: None  RED FLAGS: None   WEIGHT BEARING RESTRICTIONS: No  FALLS:  Has patient fallen in last 6 months? No  OCCUPATION: ***  PLOF: Independent  PATIENT GOALS: ***  NEXT MD VISIT: ***  OBJECTIVE:  Note: Objective measures were completed at Evaluation unless otherwise noted.  DIAGNOSTIC FINDINGS: none recent  PATIENT SURVEYS:  LEFS    MUSCLE LENGTH: Hamstrings: Right *** deg; Left *** deg Andy Bannister test: Right *** deg; Left *** deg  POSTURE: {posture:25561}  PALPATION: ***  UPPER EXTREMITY MMT:  MMT Right eval Left eval  Shoulder flexion    Shoulder extension    Shoulder abduction    Shoulder adduction    Shoulder extension    Shoulder internal rotation    Shoulder external rotation    Middle trapezius    Lower trapezius    Elbow flexion    Elbow extension    Wrist flexion    Wrist extension    Wrist ulnar deviation    Wrist radial deviation    Wrist pronation    Wrist supination    Grip strength     (Blank rows = not tested)   UPPER EXTREMITY ROM:  {AROM/PROM:27142} ROM Right eval Left eval  Shoulder flexion    Shoulder extension    Shoulder abduction    Shoulder adduction    Shoulder extension    Shoulder internal rotation    Shoulder external rotation    Elbow flexion    Elbow extension    Wrist flexion    Wrist extension    Wrist ulnar deviation    Wrist radial deviation    Wrist pronation    Wrist supination     (Blank rows = not tested)   LOWER EXTREMITY ROM:  {AROM/PROM:27142} ROM Right eval Left eval  Hip flexion    Hip extension    Hip abduction    Hip adduction    Hip internal rotation    Hip external rotation    Knee flexion    Knee extension    Ankle dorsiflexion    Ankle plantarflexion    Ankle inversion    Ankle eversion     (Blank rows = not  tested)  LOWER EXTREMITY MMT:  MMT Right eval Left eval  Hip flexion    Hip extension    Hip abduction    Hip adduction    Hip internal rotation    Hip external rotation    Knee flexion    Knee extension    Ankle dorsiflexion    Ankle plantarflexion    Ankle inversion    Ankle eversion     (Blank rows = not tested)  LOWER EXTREMITY SPECIAL TESTS:  {LEspecialtests:26242}  SHOULDER SPECIAL TESTS: Impingement tests: {shoulder impingement test:25231:a}  SLAP lesions: {SLAP lesions:25232} Instability tests: {shoulder instability test:25233} Rotator cuff assessment: {rotator cuff assessment:25234} Biceps assessment: {biceps assessment:25235}   FUNCTIONAL TESTS:  30 seconds chair stand test  GAIT: Distance walked: 82ftx2 Assistive device  utilized: {Assistive devices:23999} Level of assistance: {Levels of assistance:24026} Comments: ***                                                                                                                                TREATMENT DATE:  [facility] Adult PT Treatment:                                                DATE: 08/12/23 Eval and HEP Self Care: Additional minutes spent for educating on updated Therapeutic Home Exercise Program as well as comparing current status to condition at start of symptoms. This included exercises focusing on stretching, strengthening, with focus on eccentric aspects. Long term goals include an improvement in range of motion, strength, endurance as well as avoiding reinjury. Patient's frequency would include in 1-2 times a day, 3-5 times a week for a duration of 6-12 weeks. Proper technique shown and discussed handout in great detail. All questions were discussed and addressed.      PATIENT EDUCATION:  Education details: Discussed eval findings, rehab rationale and POC and patient is in agreement  Person educated: Patient Education method: Explanation and Handouts Education comprehension: verbalized  understanding and needs further education  HOME EXERCISE PROGRAM: ***  ASSESSMENT:  CLINICAL IMPRESSION: Patient is a 63 y.o. female who was seen today for physical therapy evaluation and treatment for ***.   OBJECTIVE IMPAIRMENTS: {opptimpairments:25111}.   ACTIVITY LIMITATIONS: {activitylimitations:27494}  PERSONAL FACTORS: {Personal factors:25162} are also affecting patient's functional outcome.   REHAB POTENTIAL: Good  CLINICAL DECISION MAKING: Stable/uncomplicated  EVALUATION COMPLEXITY: Low   GOALS: Goals reviewed with patient? No  SHORT TERM GOALS: Target date: *** Patient to demonstrate independence in HEP  Baseline: Goal status: INITIAL  2.  *** Baseline:  Goal status: INITIAL  3.  *** Baseline:  Goal status: INITIAL  4.  *** Baseline:  Goal status: INITIAL  5.  *** Baseline:  Goal status: INITIAL  6.  *** Baseline:  Goal status: INITIAL  LONG TERM GOALS: Target date: ***  Patient will increase 30s chair stand reps from *** to *** with/without arms to demonstrate and improved functional ability with less pain/difficulty as well as reduce fall risk.  Baseline:  Goal status: INITIAL  2.  Patient will score at least ***% on FOTO to signify clinically meaningful improvement in functional abilities.   Baseline:  Goal status: INITIAL  3.  Patient will acknowledge ***/10 pain at least once during episode of care   Baseline:  Goal status: INITIAL  4.  *** Baseline:  Goal status: INITIAL  5.  *** Baseline:  Goal status: INITIAL  6.  *** Baseline:  Goal status: INITIAL   PLAN:  PT FREQUENCY: 1-2x/week  PT DURATION: 6 weeks  PLANNED INTERVENTIONS: 97110-Therapeutic exercises, 97530- Therapeutic activity, [id number]- Neuromuscular re-education, 97535-  Self Care, 16109- Manual therapy, [phone]- Gait training, Patient/Family education, Balance training, and Stair training  PLAN FOR NEXT SESSION: HEP review and update, manual techniques as  appropriate, aerobic tasks, ROM and flexibility activities, strengthening and PREs, TPDN, gait and balance training as needed     Eldon Greenland, PT 08/12/2023, 1:10 PM

## 2023-08-12 ENCOUNTER — Ambulatory Visit: Attending: Family Medicine

## 2023-08-24 ENCOUNTER — Other Ambulatory Visit: Payer: Self-pay

## 2023-08-26 ENCOUNTER — Other Ambulatory Visit: Payer: Self-pay

## 2023-08-26 ENCOUNTER — Other Ambulatory Visit: Payer: Self-pay | Admitting: Internal Medicine

## 2023-08-26 DIAGNOSIS — Z21 Asymptomatic human immunodeficiency virus [HIV] infection status: Secondary | ICD-10-CM

## 2023-08-26 MED ORDER — TIVICAY 50 MG PO TABS
50.0000 mg | ORAL_TABLET | Freq: Every day | ORAL | 2 refills | Status: DC
  Filled 2023-08-26 – 2023-08-27 (×2): qty 30, 30d supply, fill #0
  Filled 2023-09-22: qty 30, 30d supply, fill #1
  Filled 2023-10-20 (×2): qty 30, 30d supply, fill #2

## 2023-08-26 MED ORDER — DESCOVY 200-25 MG PO TABS
1.0000 | ORAL_TABLET | Freq: Every day | ORAL | 2 refills | Status: DC
  Filled 2023-08-26 – 2023-08-27 (×2): qty 30, 30d supply, fill #0
  Filled 2023-09-22: qty 30, 30d supply, fill #1
  Filled 2023-10-20 (×2): qty 30, 30d supply, fill #2

## 2023-08-27 ENCOUNTER — Other Ambulatory Visit: Payer: Self-pay

## 2023-08-27 NOTE — Progress Notes (Signed)
 Specialty Pharmacy Refill Coordination Note  Courtney Hamilton is a 63 y.o. female contacted today regarding refills of specialty medication(s) Dolutegravir  Sodium (Tivicay ); Emtricitabine -Tenofovir  AF (Descovy )   Patient requested Delivery   Delivery date: 09/03/23   [address] [address]   Medication will be filled on 09/02/23.

## 2023-08-28 ENCOUNTER — Encounter (INDEPENDENT_AMBULATORY_CARE_PROVIDER_SITE_OTHER): Payer: Self-pay

## 2023-08-30 ENCOUNTER — Other Ambulatory Visit: Payer: Self-pay

## 2023-08-30 ENCOUNTER — Ambulatory Visit
Admission: RE | Admit: 2023-08-30 | Discharge: 2023-08-30 | Disposition: A | Discharge status: Home / Self Care | Source: Ambulatory Visit | Attending: Family Medicine

## 2023-08-30 DIAGNOSIS — Z1231 Encounter for screening mammogram for malignant neoplasm of breast: Secondary | ICD-10-CM | POA: Diagnosis not present

## 2023-08-31 ENCOUNTER — Other Ambulatory Visit: Payer: Self-pay | Admitting: Family Medicine

## 2023-08-31 DIAGNOSIS — G8929 Other chronic pain: Secondary | ICD-10-CM

## 2023-09-01 MED ORDER — TRAMADOL HCL 50 MG PO TABS: 50.0000 mg | ORAL_TABLET | Freq: Three times a day (TID) | ORAL | 0 refills | Status: DC | PRN

## 2023-09-01 NOTE — Addendum Note (Signed)
 Addended by: ALBA SHARPER on: 09/01/2023 01:00 PM   Modules accepted: Orders

## 2023-09-02 ENCOUNTER — Other Ambulatory Visit: Payer: Self-pay | Admitting: Family Medicine

## 2023-09-02 ENCOUNTER — Ambulatory Visit: Payer: Self-pay | Admitting: Family Medicine

## 2023-09-02 DIAGNOSIS — R928 Other abnormal and inconclusive findings on diagnostic imaging of breast: Secondary | ICD-10-CM

## 2023-09-02 DIAGNOSIS — G8929 Other chronic pain: Secondary | ICD-10-CM

## 2023-09-14 ENCOUNTER — Ambulatory Visit: Admitting: Family Medicine

## 2023-09-15 ENCOUNTER — Ambulatory Visit (INDEPENDENT_AMBULATORY_CARE_PROVIDER_SITE_OTHER): Admitting: Family Medicine

## 2023-09-15 ENCOUNTER — Encounter: Payer: Self-pay | Admitting: Family Medicine

## 2023-09-15 VITALS — BP 132/82 | HR 50 | Ht 66.0 in | Wt 193.1 lb

## 2023-09-15 DIAGNOSIS — M25561 Pain in right knee: Secondary | ICD-10-CM | POA: Diagnosis not present

## 2023-09-15 NOTE — Progress Notes (Unsigned)
    SUBJECTIVE:   CHIEF COMPLAINT / HPI:   R knee pain - ongoing for 2 months - R knee swelling and pain  - started after being on her feet a lot helping out family - does not recall any moment of injury/trauma , no popping or clicking  - has been walking, limping  - has old prescription for meloxicam  - took this yesterday which was very helpful and reduced pain and swelling    PERTINENT  PMH / PSH: Osteoarthritis, HTN, HIV, Obesity   OBJECTIVE:   BP 132/82   Pulse (!) 50   Ht 5' 6 (1.676 m)   Wt 193 lb 2 oz (87.6 kg)   SpO2 98%   BMI 31.17 kg/m   General: Well-appearing. Resting comfortably in room. MSK: Mild swelling of R knee. Mild tenderness to palpation of medical joint space. No patellar or quadriceps tendon tenderness. No posterior knee pain or masses felt. Negative anterior and posterior drawer test. Thessaly test positive on R. Nontender edema of later R foot, normal distal pulses bilterally. Normal strength and ROM of BLE.    ASSESSMENT/PLAN:   Assessment & Plan Knee pain, right anterior Overall suspect possible meniscal injury vs tendonitis with possible component of OA.  - Discussed PT - patient will consider this option for now  - Cont meloxicam  as needed for pain  - Discussed supportive care measures  - Discussed US  - patient defers for today     RTC as needed.   Damien Cassis, MD [facility]

## 2023-09-15 NOTE — Patient Instructions (Signed)
 Thank you for visiting clinic today and allowing us  to participate in your care!  Today we discussed your knee. Your exam was overall reassuring today. Please continue taking your Meloxicam  as needed. Please rest, ice, and elevate your knee for comfort. If you become interested in doing physical therapy for your knee, please let us  know.   Please schedule an appointment as needed.   Reach out any time with any questions or concerns you may have - we are here for you!  Damien Cassis, MD [facility] [phone]

## 2023-09-17 ENCOUNTER — Other Ambulatory Visit: Payer: Self-pay | Admitting: Family Medicine

## 2023-09-17 DIAGNOSIS — I10 Essential (primary) hypertension: Secondary | ICD-10-CM

## 2023-09-21 ENCOUNTER — Other Ambulatory Visit (HOSPITAL_COMMUNITY): Payer: Self-pay

## 2023-09-21 ENCOUNTER — Encounter (INDEPENDENT_AMBULATORY_CARE_PROVIDER_SITE_OTHER): Payer: Self-pay

## 2023-09-22 ENCOUNTER — Other Ambulatory Visit (HOSPITAL_COMMUNITY): Payer: Self-pay

## 2023-09-22 ENCOUNTER — Ambulatory Visit
Admission: RE | Admit: 2023-09-22 | Discharge: 2023-09-22 | Disposition: A | Discharge status: Home / Self Care | Source: Ambulatory Visit | Attending: Family Medicine | Admitting: Family Medicine

## 2023-09-22 ENCOUNTER — Other Ambulatory Visit: Payer: Self-pay

## 2023-09-22 DIAGNOSIS — N6315 Unspecified lump in the right breast, overlapping quadrants: Secondary | ICD-10-CM | POA: Diagnosis not present

## 2023-09-22 DIAGNOSIS — R928 Other abnormal and inconclusive findings on diagnostic imaging of breast: Secondary | ICD-10-CM

## 2023-09-22 DIAGNOSIS — N6001 Solitary cyst of right breast: Secondary | ICD-10-CM | POA: Diagnosis not present

## 2023-09-22 NOTE — Progress Notes (Signed)
 Specialty Pharmacy Refill Coordination Note  MyChart Questionnaire Submission  Courtney Hamilton is a 63 y.o. female contacted today regarding refills of specialty medication(s) Descovy  & Tivicay .  Doses on hand: (Patient-Rptd) 25   Patient requested: (Patient-Rptd) Delivery   Delivery date: 09/28/23   Verified address: (Patient-Rptd) [address]  Medication will be filled on 09/27/23. Refill too soon until 09/25/23.

## 2023-09-23 ENCOUNTER — Other Ambulatory Visit: Payer: Self-pay | Admitting: Family Medicine

## 2023-09-23 ENCOUNTER — Ambulatory Visit: Payer: Self-pay | Admitting: Family Medicine

## 2023-09-23 DIAGNOSIS — N6489 Other specified disorders of breast: Secondary | ICD-10-CM

## 2023-09-23 DIAGNOSIS — R928 Other abnormal and inconclusive findings on diagnostic imaging of breast: Secondary | ICD-10-CM | POA: Insufficient documentation

## 2023-09-23 NOTE — Telephone Encounter (Signed)
 Called patient, discussed breast ultrasound and mammogram results.  Patient already had viewed results within epic.  She understands need for repeat mammogram in 6 months.  This has been ordered.  Answered all questions.

## 2023-09-27 ENCOUNTER — Other Ambulatory Visit: Payer: Self-pay

## 2023-09-30 MED ORDER — TRAMADOL HCL 50 MG PO TABS: 50.0000 mg | ORAL_TABLET | Freq: Three times a day (TID) | ORAL | 0 refills | Status: DC | PRN

## 2023-09-30 NOTE — Addendum Note (Signed)
 Addended by: ALBA SHARPER on: 09/30/2023 01:13 PM   Modules accepted: Orders

## 2023-10-01 ENCOUNTER — Encounter: Payer: Self-pay | Admitting: Family Medicine

## 2023-10-01 ENCOUNTER — Ambulatory Visit: Admitting: Family Medicine

## 2023-10-01 VITALS — BP 118/75 | HR 56 | Ht 65.0 in | Wt 190.8 lb

## 2023-10-01 DIAGNOSIS — M810 Age-related osteoporosis without current pathological fracture: Secondary | ICD-10-CM | POA: Diagnosis not present

## 2023-10-01 DIAGNOSIS — R7303 Prediabetes: Secondary | ICD-10-CM

## 2023-10-01 DIAGNOSIS — L989 Disorder of the skin and subcutaneous tissue, unspecified: Secondary | ICD-10-CM

## 2023-10-01 DIAGNOSIS — Z23 Encounter for immunization: Secondary | ICD-10-CM

## 2023-10-01 DIAGNOSIS — Z87891 Personal history of nicotine dependence: Secondary | ICD-10-CM

## 2023-10-01 LAB — POCT GLYCOSYLATED HEMOGLOBIN (HGB A1C): HbA1c, POC (prediabetic range): 5.9 % (ref 5.7–6.4)

## 2023-10-01 NOTE — Assessment & Plan Note (Signed)
 Hx osteoporosis. Takes vit C and D Last DEXA 2011 Ordered DEXA today

## 2023-10-01 NOTE — Progress Notes (Signed)
    SUBJECTIVE:   CHIEF COMPLAINT / HPI:   Skin lesion Presents with skin lesion on right inner thigh States this has been there for several years, occasionally itchy and flaky Unsure if it has changed in size that she has difficulty seeing it due to location States that she has been told in the past that it was skin cancer  Also inquires about her semaglutide  authorization States she was prescribed this by PCP but did not hear whether or not it was approved and she would still like to start it  PERTINENT  PMH / PSH: HTN, COPD with asthma, chronic back pain, HIV  OBJECTIVE:   BP 118/75   Pulse (!) 56   Ht 5' 5 (1.651 m)   Wt 190 lb 12.8 oz (86.5 kg)   SpO2 100%   BMI 31.75 kg/m   General: well appearing, NAD Cardiovascular: RRR, no m/r/g Respiratory: normal work of breathing on RA, CTAB Skin: Skin lesion located below right gluteus. Hyperpigmented and uneven texture with symmetric borders   ASSESSMENT/PLAN:   Assessment & Plan Skin lesion Skin lesion above, present for many years.  Differential includes seborrheic keratosis vs nevus vs melanoma vs squamous cell carcinoma Placed dermatology referral to have area biopsied Prediabetes A1C improved to 5.9 today from 6.1 04/2023 Patient still concerned about weight, she is trying to eat healthy and live healthy lifestyle Sent message to PCP regarding prior Auth for semaglutide  Recheck A1C 3 months Osteoporosis without current pathological fracture, unspecified osteoporosis type Hx osteoporosis. Takes vit C and D Last DEXA 2011 Ordered DEXA today Former smoker Former smoker estimated 40 pack years Due for low dose CT lung CA screening, ordered today Need for shingles vaccine Pt will get this at pharmacy Encounter for immunization Pneumococcal vaccine updated today     Elyce Prescott, DO [facility]

## 2023-10-01 NOTE — Patient Instructions (Signed)
 Good to see you today - Thank you for coming in  Things we discussed today: It was right to meet you. I have sent in a dermatology referral for your skin lesion.  Let us  know if you do not hear from them in the next week or 2. I have also ordered your lung cancer screening and your DEXA scan.  We will schedule your lung cancer screening and let you know when that is scheduled for.  They will call you about your DEXA scan appointment. We also updated your pneumococcal vaccine today.  I recommend going to the pharmacy to get your shingles vaccine.  I will message your PCP regarding your semaglutide  authorization

## 2023-10-01 NOTE — Assessment & Plan Note (Addendum)
 A1C improved to 5.9 today from 6.1 04/2023 Patient still concerned about weight, she is trying to eat healthy and live healthy lifestyle Sent message to PCP regarding prior Auth for semaglutide  Recheck A1C 3 months

## 2023-10-08 ENCOUNTER — Ambulatory Visit
Admission: RE | Admit: 2023-10-08 | Discharge: 2023-10-08 | Disposition: A | Discharge status: Home / Self Care | Source: Ambulatory Visit | Attending: Family Medicine | Admitting: Family Medicine

## 2023-10-08 DIAGNOSIS — Z87891 Personal history of nicotine dependence: Secondary | ICD-10-CM | POA: Diagnosis not present

## 2023-10-18 ENCOUNTER — Other Ambulatory Visit: Payer: Self-pay

## 2023-10-18 DIAGNOSIS — B20 Human immunodeficiency virus [HIV] disease: Secondary | ICD-10-CM

## 2023-10-18 DIAGNOSIS — Z113 Encounter for screening for infections with a predominantly sexual mode of transmission: Secondary | ICD-10-CM

## 2023-10-18 DIAGNOSIS — Z79899 Other long term (current) drug therapy: Secondary | ICD-10-CM

## 2023-10-19 ENCOUNTER — Other Ambulatory Visit: Payer: Self-pay | Admitting: Family Medicine

## 2023-10-19 ENCOUNTER — Ambulatory Visit (INDEPENDENT_AMBULATORY_CARE_PROVIDER_SITE_OTHER): Admitting: Student

## 2023-10-19 VITALS — BP 136/86 | HR 58 | Ht 66.0 in | Wt 191.8 lb

## 2023-10-19 DIAGNOSIS — M25561 Pain in right knee: Secondary | ICD-10-CM

## 2023-10-19 DIAGNOSIS — G8929 Other chronic pain: Secondary | ICD-10-CM

## 2023-10-19 MED ORDER — METHYLPREDNISOLONE ACETATE 40 MG/ML IJ SUSP
40.0000 mg | Freq: Once | INTRAMUSCULAR | Status: AC
  Administered 2023-10-19 (×2): 40 mg via INTRAMUSCULAR

## 2023-10-19 NOTE — Progress Notes (Signed)
    SUBJECTIVE:   CHIEF COMPLAINT / HPI:   Courtney Hamilton is a 63 year old female who presents with right knee pain.  She has experienced recurrent right knee pain over the past twenty years, initially triggered by a twisting injury. Approximately ten years ago, a similar incident occurred while bowling. Recently, she suspects another twist happened after her brother's passing in May.  Meloxicam  was previously prescribed for inflammation, effectively reducing it but causing gastrointestinal side effects, including stomach cramps and constipation. Despite this, knee pain persists without relief.  Her foot swells when standing for activities such as cooking. There is no knee buckling, locking, or giving way. Chronic back pain is present but unrelated to the current knee pain.  PERTINENT  PMH / PSH: Reviewed   OBJECTIVE:   BP 136/86   Pulse (!) 58   Ht 5' 6 (1.676 m)   Wt 191 lb 12.8 oz (87 kg)   SpO2 100%   BMI 30.96 kg/m    Physical Exam  General: Alert, well appearing, NAD  R Knee Exam No effusion.  No other gross deformity, ecchymoses. TTP medial joint line. FROM with normal strength. Negative ant/post drawers. Negative valgus/varus testing. Negative lachman. NV intact distally.   ASSESSMENT/PLAN:    PROCEDURE: LEFT KNEE INJECTION: Patient was given informed consent, signed copy in the chart. Appropriate time out was taken. Area prepped in usual sterile fashion. Ethyl chloride was  used for local anesthesia. A 21 gauge 1 1/2 inch needle was used. 1 cc of methylprednisolone  40 mg/ml plus 3 cc of 1% lidocaine  without epinephrine was injected into the left knee using a(n) anteromedial approach. The patient tolerated the procedure well. There were no complications. Post procedure instructions were given. Patient recommended to take Tylenol  for pain relieve. Right knee xray was ordered suspect arthritis given x-ray finding in 2018 showing degenerative disease.  Norleen April,  MD [facility]

## 2023-10-19 NOTE — Patient Instructions (Addendum)
 Pleasure to meet you today.  Today we gave you steroid injections in your right knee.  I have also ordered x-ray of your right knee as well.  Please go to [facility] imaging on [address] [place].  You can do Tylenol  for pain relief

## 2023-10-20 ENCOUNTER — Other Ambulatory Visit

## 2023-10-20 ENCOUNTER — Other Ambulatory Visit: Payer: Self-pay

## 2023-10-20 ENCOUNTER — Other Ambulatory Visit: Payer: Self-pay | Admitting: Pharmacy Technician

## 2023-10-20 NOTE — Progress Notes (Signed)
 Specialty Pharmacy Refill Coordination Note  Courtney Hamilton is a 63 y.o. female contacted today regarding refills of specialty medication(s) Dolutegravir  Sodium (Tivicay ); Emtricitabine -Tenofovir  AF (Descovy )   Patient requested Delivery   Delivery date: 10/28/23   Verified address: [address] [place]   Medication will be filled on 10/27/23.

## 2023-10-21 ENCOUNTER — Telehealth: Payer: Self-pay

## 2023-10-21 ENCOUNTER — Ambulatory Visit
Admission: RE | Admit: 2023-10-21 | Discharge: 2023-10-21 | Disposition: A | Discharge status: Home / Self Care | Source: Ambulatory Visit | Attending: Family Medicine | Admitting: Family Medicine

## 2023-10-21 DIAGNOSIS — G8929 Other chronic pain: Secondary | ICD-10-CM

## 2023-10-21 DIAGNOSIS — M1711 Unilateral primary osteoarthritis, right knee: Secondary | ICD-10-CM | POA: Diagnosis not present

## 2023-10-21 NOTE — Telephone Encounter (Signed)
 Received call from [facility] regarding call report on CT lung cancer screening.   See below impression.  1. Lung-RADS 1S, negative. Continue annual screening with low-dose chest CT without contrast in 12 months. 2. The S modifier represents a potentially clinically significant non pulmonary finding. Developing mild right axillary adenopathy. The patient underwent diagnostic mammogram on 09/22/2023 and follow-up left-sided diagnostic mammogram is planned in 6 months. Consider ultrasound of right axillary nodes at this time. 3. Aortic Atherosclerosis (ICD10-I70.0) and Emphysema (ICD10-J43.9). 4. Age advanced coronary artery atherosclerosis. Recommend assessment of coronary risk factors.  Forwarding to PCP and ordering provider.   Chiquita JAYSON English, RN

## 2023-10-22 ENCOUNTER — Other Ambulatory Visit: Payer: Self-pay | Admitting: Family Medicine

## 2023-10-22 ENCOUNTER — Ambulatory Visit: Payer: Self-pay | Admitting: Family Medicine

## 2023-10-22 DIAGNOSIS — R590 Localized enlarged lymph nodes: Secondary | ICD-10-CM

## 2023-10-22 DIAGNOSIS — R928 Other abnormal and inconclusive findings on diagnostic imaging of breast: Secondary | ICD-10-CM

## 2023-10-22 NOTE — Progress Notes (Signed)
 Scheduled US  appt for the patient and left the information on her voice mail.

## 2023-10-27 ENCOUNTER — Other Ambulatory Visit

## 2023-10-27 ENCOUNTER — Other Ambulatory Visit: Payer: Self-pay

## 2023-10-27 ENCOUNTER — Other Ambulatory Visit: Payer: Self-pay | Admitting: Family Medicine

## 2023-10-27 ENCOUNTER — Encounter

## 2023-10-27 DIAGNOSIS — I10 Essential (primary) hypertension: Secondary | ICD-10-CM

## 2023-10-30 ENCOUNTER — Encounter: Payer: Self-pay | Admitting: Family Medicine

## 2023-10-30 ENCOUNTER — Encounter: Payer: Self-pay | Admitting: Student

## 2023-10-30 DIAGNOSIS — G8929 Other chronic pain: Secondary | ICD-10-CM

## 2023-11-01 ENCOUNTER — Encounter: Payer: Self-pay | Admitting: Dermatology

## 2023-11-01 ENCOUNTER — Ambulatory Visit (INDEPENDENT_AMBULATORY_CARE_PROVIDER_SITE_OTHER): Admitting: Dermatology

## 2023-11-01 VITALS — BP 127/79 | HR 52

## 2023-11-01 DIAGNOSIS — D492 Neoplasm of unspecified behavior of bone, soft tissue, and skin: Secondary | ICD-10-CM

## 2023-11-01 DIAGNOSIS — D485 Neoplasm of uncertain behavior of skin: Secondary | ICD-10-CM

## 2023-11-01 DIAGNOSIS — D045 Carcinoma in situ of skin of trunk: Secondary | ICD-10-CM | POA: Diagnosis not present

## 2023-11-01 MED ORDER — MUPIROCIN 2 % EX OINT: 1.0000 | TOPICAL_OINTMENT | Freq: Two times a day (BID) | CUTANEOUS | 0 refills | Status: DC

## 2023-11-01 MED ORDER — TRAMADOL HCL 50 MG PO TABS: 50.0000 mg | ORAL_TABLET | Freq: Three times a day (TID) | ORAL | 0 refills | Status: DC | PRN

## 2023-11-01 NOTE — Progress Notes (Signed)
   New Patient Visit   Subjective  Courtney Hamilton is a 63 y.o. female who presents for the following: There is a lesion that in in the groin that has been present for at least 15 years. She states that it burns and is itchy. She was seen by a dermatologist in [place]  that said it was skin cancer but it has not been treated. Her PCP referred her to use to address.   The following portions of the chart were reviewed this encounter and updated as appropriate: medications, allergies, medical history  Review of Systems:  No other skin or systemic complaints except as noted in HPI or Assessment and Plan.  Objective  Well appearing patient in no apparent distress; mood and affect are within normal limits.  A focused examination was performed of the following areas: groin  Relevant exam findings are noted in the Assessment and Plan.  Right Genitocrural Fold 5.2cm x 2.8cm brown black scaly plaque   Assessment & Plan    NEOPLASM OF UNCERTAIN BEHAVIOR OF SKIN Right Genitocrural Fold Skin / nail biopsy Type of biopsy: punch   Informed consent: discussed and consent obtained   Timeout: patient name, date of birth, surgical site, and procedure verified   Anesthesia: the lesion was anesthetized in a standard fashion   Anesthetic:  1% lidocaine  w/ epinephrine 1-100,000 buffered w/ 8.4% NaHCO3 Punch size:  3 mm Hemostasis achieved with: Gelfoam and electrodesiccation   Outcome: patient tolerated procedure well   Post-procedure details: sterile dressing applied and wound care instructions given   Dressing type: bandage    mupirocin  ointment (BACTROBAN ) 2 % Apply 1 Application topically 2 (two) times daily. Specimen 1 - Surgical pathology Differential Diagnosis: SCCIS vs tinea vs CMN vs MM vs infectious vs other  Check Margins: No  Return for FBSE.  LILLETTE Rollene Gobble, RN, am acting as scribe for RUFUS CHRISTELLA HOLY, MD .   Documentation: I have reviewed the above documentation for accuracy  and completeness, and I agree with the above.  RUFUS CHRISTELLA HOLY, MD

## 2023-11-01 NOTE — Patient Instructions (Signed)

## 2023-11-02 ENCOUNTER — Ambulatory Visit: Payer: Self-pay | Admitting: Student

## 2023-11-02 DIAGNOSIS — G8929 Other chronic pain: Secondary | ICD-10-CM

## 2023-11-02 NOTE — Telephone Encounter (Signed)
 Called patient and informed patient her knee xray showed mild arthritis. Patient said knee pain is worse now; looks red and is warm. Steroid inject few weeks ago provided brief relieve and at the time didn't have the warms and associated redness. Patient advised to use half of her 15mg  meloxicam  given prior report of stomach upset on the medication although it usually helps with her knee pain. Also placed referral to sports medicine.

## 2023-11-02 NOTE — Telephone Encounter (Signed)
 Patient calls nurse line checking the status of imaging results.   She reports continued knee pain. PCP refilled her Tramadol .  Will forward to ordering MD.

## 2023-11-03 ENCOUNTER — Other Ambulatory Visit: Payer: Self-pay

## 2023-11-03 ENCOUNTER — Ambulatory Visit: Payer: Self-pay | Admitting: Internal Medicine

## 2023-11-03 ENCOUNTER — Ambulatory Visit: Payer: Self-pay | Admitting: Dermatology

## 2023-11-03 ENCOUNTER — Encounter: Payer: Self-pay | Admitting: Internal Medicine

## 2023-11-03 VITALS — BP 123/77 | HR 53 | Temp 97.7°F | Ht 66.0 in | Wt 183.0 lb

## 2023-11-03 DIAGNOSIS — E669 Obesity, unspecified: Secondary | ICD-10-CM

## 2023-11-03 DIAGNOSIS — B20 Human immunodeficiency virus [HIV] disease: Secondary | ICD-10-CM

## 2023-11-03 DIAGNOSIS — N951 Menopausal and female climacteric states: Secondary | ICD-10-CM

## 2023-11-03 DIAGNOSIS — Z79899 Other long term (current) drug therapy: Secondary | ICD-10-CM | POA: Diagnosis not present

## 2023-11-03 DIAGNOSIS — G8929 Other chronic pain: Secondary | ICD-10-CM | POA: Diagnosis not present

## 2023-11-03 DIAGNOSIS — M549 Dorsalgia, unspecified: Secondary | ICD-10-CM

## 2023-11-03 DIAGNOSIS — Z21 Asymptomatic human immunodeficiency virus [HIV] infection status: Secondary | ICD-10-CM

## 2023-11-03 DIAGNOSIS — Z6829 Body mass index (BMI) 29.0-29.9, adult: Secondary | ICD-10-CM

## 2023-11-03 LAB — SURGICAL PATHOLOGY

## 2023-11-03 MED ORDER — TIVICAY 50 MG PO TABS: 50.0000 mg | ORAL_TABLET | Freq: Every day | ORAL | 11 refills | Status: DC

## 2023-11-03 MED ORDER — DESCOVY 200-25 MG PO TABS
1.0000 | ORAL_TABLET | Freq: Every day | ORAL | 5 refills | Status: AC
  Filled 2023-11-03 – 2023-12-17 (×4): qty 30, 30d supply, fill #0
  Filled 2024-01-20 – 2024-03-31 (×2): qty 30, 30d supply, fill #1

## 2023-11-03 MED ORDER — TIVICAY 50 MG PO TABS
50.0000 mg | ORAL_TABLET | Freq: Every day | ORAL | 5 refills | Status: AC
  Filled 2023-11-03 – 2023-12-17 (×4): qty 30, 30d supply, fill #0
  Filled 2024-01-20 – 2024-03-31 (×2): qty 30, 30d supply, fill #1

## 2023-11-03 MED ORDER — DESCOVY 200-25 MG PO TABS: 1.0000 | ORAL_TABLET | Freq: Every day | ORAL | 11 refills | Status: DC

## 2023-11-03 NOTE — Progress Notes (Signed)
 Patient ID: Courtney Hamilton, female   DOB: 10/11/1960, 63 y.o.   MRN: [id number]  HPI --------------- Deion Melamed is a 63 year old female with HIV who presents for a follow-up visit.  She has been managing HIV for 40 years and is currently on Tivicay  and Descovy , with excellent adherence to her medication regimen and no missed doses. She notes a significant change in her treatment regimen from taking multiple pills multiple times a day to now taking two pills once daily.  She is experiencing challenges with weight gain, which she attributes to postmenopausal changes. She is using a hormone patch and an over-the-counter supplement called Estroven, which contains black cohosh, to manage her symptoms. There is a reduction in hot flashes, although a significant hot flash occurs in the morning.  Knee pain has been present since 08/08/2023, believed to have started after twisting her knee. A cortisone shot a week ago alleviated some inflammation, but significant pain persists, particularly in the morning.  She is also dealing with back pain. Some improvement in inflammation in her hips is noted since starting cholesterol medication.  She is grieving the recent loss of her brother, who passed away in 2023/08/08 due to complications from sepsis following a hip replacement. This loss has been particularly difficult as she was very close, speaking daily. She is considering counseling to help cope with her grief and the isolation she feels, as she does not drive and has limited mobility due to her knee pain.  Outpatient Encounter Medications as of 11/03/2023  Medication Sig   albuterol  (VENTOLIN  HFA) 108 (90 Base) MCG/ACT inhaler TAKE 2 PUFFS BY MOUTH EVERY 6 HOURS AS NEEDED FOR WHEEZE OR SHORTNESS OF BREATH   Ascorbic Acid (VITAMIN C) 1000 MG tablet Take 1,000 mg by mouth at bedtime.   Black Cohosh-Soy-Ginkgo-Magnol (ESTROVEN MOOD & MEMORY PO) Take 1 tablet by mouth daily.   BREZTRI  AEROSPHERE 160-9-4.8 MCG/ACT  AERO INHALE 2 PUFFS INTO THE LUNGS 2 TIMES DAILY. RINSE MOUTH AFTER USE TO AVOID THRUSH.   Cholecalciferol (VITAMIN D3) 25 MCG (1000 UT) capsule Take 1,000 Units by mouth daily.   COLLAGEN PO Take 1 Scoop by mouth daily. Powder mixed into smoothie   diclofenac  Sodium (VOLTAREN ) 1 % GEL APPLY 2 GRAMS TO AFFECTED AREA 4 TIMES A DAY   dolutegravir  (TIVICAY ) 50 MG tablet Take 1 tablet (50 mg total) by mouth daily.   emtricitabine -tenofovir  AF (DESCOVY ) 200-25 MG tablet Take 1 tablet by mouth daily.   estradiol  (CLIMARA  - DOSED IN MG/24 HR) 0.1 mg/24hr patch PLACE 1 PATCH (0.1 MG TOTAL) ONTO THE SKIN ONCE A WEEK.   gabapentin  (NEURONTIN ) 100 MG capsule TAKE 1 IN THE MORNING AND AT LUNCH FOR YOUR BACK PAIN. TAKE 3 AT NIGHT BEFORE YOU GO TO SLEEP.   losartan  (COZAAR ) 50 MG tablet Take 1 tablet (50 mg total) by mouth daily.   Melatonin 5 MG CAPS Take 1 capsule by mouth at bedtime.   mupirocin  ointment (BACTROBAN ) 2 % Apply 1 Application topically 2 (two) times daily.   Omega-3 Fatty Acids (FISH OIL) 300 MG CAPS Take by mouth.   rosuvastatin  (CRESTOR ) 10 MG tablet TAKE 1 TABLET BY MOUTH EVERY DAY   Semaglutide ,0.25 or 0.5MG /DOS, 2 MG/1.5ML SOPN Inject 0.25 mg into the skin once a week. 0.25 mg once weekly for 4 weeks then increase to 0.5 mg weekly for at least 4 weeks,max 1 mg   traMADol  (ULTRAM ) 50 MG tablet Take 1 tablet (50 mg total) by  mouth 3 (three) times daily as needed for moderate pain (pain score 4-6). Please make appointment before next refill.   No facility-administered encounter medications on file as of 11/03/2023.     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   Cervical stenosis (uterine cervix)    Essential hypertension 08/05/2017   Insomnia 11/05/2015   Former cigarette smoker 11/05/2015   Lumbosacral spondylosis without myelopathy 09/29/2013   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   Herpes simplex 10/07/2011   Peripheral neuropathy 10/07/2011   Hypercholesteremia 10/07/2011   History of tuberculosis exposure 10/01/2011   HIV (human immunodeficiency virus infection) (HCC) 08/03/2011   Sleep disorder 08/03/2011   Chronic low back pain with sciatica 07/21/2011   Osteoporosis 03/09/2008     Health Maintenance Due  Topic Date Due   Zoster Vaccines- Shingrix (1 of 2) Never done   COVID-19 Vaccine (4 - Pfizer risk 2024-25 season) 07/06/2023   DEXA SCAN  07/24/2023   INFLUENZA VACCINE  10/08/2023   Medicare Annual Wellness (AWV)  11/06/2023     Review of Systems 12 point ros is negative except what is mentioned above Physical Exam   There were no vitals taken for this visit.  Physical Exam  Constitutional:  oriented to person, place, and time. appears well-developed and well-nourished. No distress.  HENT: [place]/AT, PERRLA, no scleral icterus Mouth/Throat: Oropharynx is clear and moist. No oropharyngeal exudate.  Cardiovascular: Normal rate, regular rhythm and normal heart sounds. Exam reveals no gallop and no friction rub.  No murmur heard.  Pulmonary/Chest: Effort normal and breath sounds normal. No respiratory distress.  has no wheezes.  Neck = supple, no nuchal rigidity Abdominal: Soft. Bowel sounds are normal.  exhibits no distension. There is no tenderness.  Lymphadenopathy: no cervical adenopathy. No axillary adenopathy Neurological: alert and oriented to person, place, and time.  Skin: Skin is warm and dry. No rash noted. No erythema.  Psychiatric: a normal mood and affect.  behavior is normal.   Lab Results  Component Value Date   CD4TCELL 21  (L) 01/05/2023   Lab Results  Component Value Date   CD4TABS 553 01/05/2023   CD4TABS 606 03/19/2022   CD4TABS 571 07/15/2021   Lab Results  Component Value Date   HIV1RNAQUANT Not Detected 01/05/2023   Lab Results  Component Value Date   HEPBSAB REACTIVE (A) 05/18/2017   Lab Results  Component Value Date   LABRPR NON-REACTIVE 01/05/2023    CBC Lab Results  Component Value Date   WBC 7.2 12/10/2022   RBC 4.60 12/10/2022   HGB 13.1 12/10/2022   HCT 41.5 12/10/2022   PLT 237 12/10/2022   MCV 90.2 12/10/2022   MCH 28.5 12/10/2022   MCHC 31.6 12/10/2022   RDW 13.2 12/10/2022   LYMPHSABS 2.1 12/10/2022   MONOABS 0.6 12/10/2022   EOSABS 0.4 12/10/2022    BMET Lab Results  Component Value Date   NA 141 12/10/2022   K 4.2 12/10/2022   CL 103 12/10/2022   CO2 25 12/10/2022   GLUCOSE 98 12/10/2022   BUN 7 (L) 12/10/2022   CREATININE 0.90  12/10/2022   CALCIUM  9.3 12/10/2022   GFRNONAA >60 12/10/2022   GFRAA 70 08/08/2020      Assessment and Plan   Human immunodeficiency virus (HIV) infection HIV infection well-controlled with current antiretroviral therapy (Tivicay  and Descovy ). No missed doses reported. She has been managing HIV for 40 years and is adherent to medication regimen. Discussed ongoing developments in HIV treatment, including potential for less frequent dosing with injections or weekly pills. - Continue current antiretroviral therapy with Tivicay  and Descovy  - Refill HIV medications - will check cd 4  count and viral load  Long term medication management = will check cbc and bmp to see that is at baseline  Right knee pain, possible internal derangement Chronic right knee pain since May, possibly due to internal derangement. Pain exacerbated by weight-bearing activities. Cortisone injection provided some relief, but persistent localized pain suggests possible tendon or ligament injury. Referral to sports medicine for further evaluation is  pending. - Refer to sports medicine for further evaluation of right knee - Advise use of heat for pain relief until appointment - Anticipate possible MRI for further assessment  Back pain Chronic back pain, possibly related to obesity and exacerbated by limited mobility. Improvement noted with cholesterol medication reducing inflammation.  Obesity Struggling with weight management, possibly exacerbated by postmenopausal changes. Weight gain and limited mobility are both impacting her ability to exercise and may be contributing to knee and back pain.  Menopausal symptoms Experiencing menopausal symptoms including hot flashes, managed with hormone replacement therapy (patch) and over-the-counter supplements (Estroven). Symptoms are mostly controlled except for morning hot flashes.  Depressive symptoms Experiencing depressive symptoms, possibly related to recent bereavement and isolation. Acknowledges need for counseling and considering online therapy options. Support system includes sister-in-law and friend, but limited by lack of transportation and mobility issues. - Encourage initiation of online counseling services

## 2023-11-04 ENCOUNTER — Ambulatory Visit
Admission: RE | Admit: 2023-11-04 | Discharge: 2023-11-04 | Disposition: A | Discharge status: Home / Self Care | Source: Ambulatory Visit | Attending: Family Medicine | Admitting: Family Medicine

## 2023-11-04 ENCOUNTER — Other Ambulatory Visit: Payer: Self-pay | Admitting: Family Medicine

## 2023-11-04 DIAGNOSIS — R590 Localized enlarged lymph nodes: Secondary | ICD-10-CM

## 2023-11-04 DIAGNOSIS — R599 Enlarged lymph nodes, unspecified: Secondary | ICD-10-CM

## 2023-11-04 LAB — T-HELPER CELL (CD4) - (RCID CLINIC ONLY)
CD4 % Helper T Cell: 19 % — ABNORMAL LOW (ref 33–65)
CD4 T Cell Abs: 397 /uL — ABNORMAL LOW (ref 400–1790)

## 2023-11-05 ENCOUNTER — Ambulatory Visit: Payer: Self-pay | Admitting: Family Medicine

## 2023-11-05 LAB — COMPLETE METABOLIC PANEL WITHOUT GFR
AG Ratio: 1.4 (calc) (ref 1.0–2.5)
ALT: 12 U/L (ref 6–29)
AST: 16 U/L (ref 10–35)
Albumin: 4 g/dL (ref 3.6–5.1)
Alkaline phosphatase (APISO): 49 U/L (ref 37–153)
BUN: 11 mg/dL (ref 7–25)
CO2: 28 mmol/L (ref 20–32)
Calcium: 9.2 mg/dL (ref 8.6–10.4)
Chloride: 102 mmol/L (ref 98–110)
Creat: 0.95 mg/dL (ref 0.50–1.05)
Globulin: 2.8 g/dL (ref 1.9–3.7)
Glucose, Bld: 92 mg/dL (ref 65–99)
Potassium: 3.9 mmol/L (ref 3.5–5.3)
Sodium: 138 mmol/L (ref 135–146)
Total Bilirubin: 0.5 mg/dL (ref 0.2–1.2)
Total Protein: 6.8 g/dL (ref 6.1–8.1)

## 2023-11-05 LAB — LIPID PANEL
Cholesterol: 174 mg/dL (ref <200)
HDL: 77 mg/dL (ref >=50)
LDL Cholesterol (Calc): 82 mg/dL
Non-HDL Cholesterol (Calc): 97 mg/dL (ref <130)
Total CHOL/HDL Ratio: 2.3 (calc) (ref <5.0)
Triglycerides: 69 mg/dL (ref <150)

## 2023-11-05 LAB — CBC WITH DIFFERENTIAL/PLATELET
Absolute Lymphocytes: 2212 {cells}/uL (ref 850–3900)
Absolute Monocytes: 608 {cells}/uL (ref 200–950)
Basophils Absolute: 61 {cells}/uL (ref 0–200)
Basophils Relative: 0.8 %
Eosinophils Absolute: 327 {cells}/uL (ref 15–500)
Eosinophils Relative: 4.3 %
HCT: 40.6 % (ref 35.0–45.0)
Hemoglobin: 12.7 g/dL (ref 11.7–15.5)
MCH: 28.3 pg (ref 27.0–33.0)
MCHC: 31.3 g/dL — ABNORMAL LOW (ref 32.0–36.0)
MCV: 90.4 fL (ref 80.0–100.0)
MPV: 10.5 fL (ref 7.5–12.5)
Monocytes Relative: 8 %
Neutro Abs: 4393 {cells}/uL (ref 1500–7800)
Neutrophils Relative %: 57.8 %
Platelets: 249 Thousand/uL (ref 140–400)
RBC: 4.49 Million/uL (ref 3.80–5.10)
RDW: 11.9 % (ref 11.0–15.0)
Total Lymphocyte: 29.1 %
WBC: 7.6 Thousand/uL (ref 3.8–10.8)

## 2023-11-05 LAB — HIV-1 RNA QUANT-NO REFLEX-BLD
HIV 1 RNA Quant: NOT DETECTED {copies}/mL
HIV-1 RNA Quant, Log: NOT DETECTED {Log_copies}/mL

## 2023-11-05 LAB — RPR: RPR Ser Ql: NONREACTIVE

## 2023-11-05 NOTE — Telephone Encounter (Signed)
 Spoke with pt and told her to sched an ov to discuss treatment options

## 2023-11-05 NOTE — Telephone Encounter (Signed)
-----   Message from [facility] sent at 11/03/2023  9:03 PM EDT ----- Will need consult to discuss Mohs vs consideration of debulking with topical 5FU BID x 6 weeks and then Mohs ----- Message ----- From: Interface, Lab In Three Zero One Sent: 11/03/2023   5:00 PM EDT To: Rufus CHRISTELLA Holy, MD

## 2023-11-10 ENCOUNTER — Ambulatory Visit (INDEPENDENT_AMBULATORY_CARE_PROVIDER_SITE_OTHER): Admitting: Internal Medicine

## 2023-11-10 ENCOUNTER — Encounter: Payer: Self-pay | Admitting: Internal Medicine

## 2023-11-10 ENCOUNTER — Other Ambulatory Visit: Payer: Self-pay

## 2023-11-10 VITALS — BP 124/86 | Ht 66.0 in | Wt 190.0 lb

## 2023-11-10 DIAGNOSIS — M25561 Pain in right knee: Secondary | ICD-10-CM

## 2023-11-10 DIAGNOSIS — G8929 Other chronic pain: Secondary | ICD-10-CM

## 2023-11-10 MED ORDER — CELECOXIB 100 MG PO CAPS: 100.0000 mg | ORAL_CAPSULE | Freq: Two times a day (BID) | ORAL | 1 refills | Status: DC | PRN

## 2023-11-10 NOTE — Progress Notes (Cosign Needed)
 PCP: Alba Sharper, MD  Subjective:   HPI: Patient is a 63 y.o. female here for acute on chronic right knee pain.  She endorses chronic right knee pain for over 20 years.  Pain began after a twisting injury while caring for her mother.  She describes multiple exacerbations of pain in previous years that seem to be elicited by twisting injury.  Her pain is currently located on the anteromedial aspect of the knee.  Generally worse with movement, particularly extended periods of standing and ambulation.  She received a corticosteroid injection at the family medicine center about 10 days ago that provided mild pain relief.  She does not believe that her pain is due to arthritis and is concerned for other pathology.  She is taking tramadol  for pain relief.  She has previously tried NSAIDs but says that they are not effective.  She is also wearing a compression sleeve.  She believes that her current flare of pain may be due to another twisting injury that happened in May while visiting [place] .  Past Medical History:  Diagnosis Date   Arthritis    right hip   Bronchitis last time  sept/oct 2021   Flu-like symptoms 05/25/2016   Fungal infection of skin of abdomen 06/16/2017   Hepatitis 1980's   NON A NON B tx for 1980's resolved   Herniated disc 10/07/2011   lower back   History of chronic kidney disease 1980's   resolved   HIV (human immunodeficiency virus infection) (HCC) 1985   Hypertension    Lower abdominal pain 10/08/2014   Lower GI bleed 05/10/2012   pt denies   Lower leg edema 12/10/2011   Neuropathy    resolved   Osteoporosis    Panic attacks 12/22/2013   Recurrent UTI 08/06/2011   Ruptured lumbar disc    Sciatica    right side   Substance abuse (HCC)    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     Black Cohosh-Soy-Ginkgo-Magnol (ESTROVEN MOOD & MEMORY PO) Take 1 tablet by mouth daily.     BREZTRI  AEROSPHERE 160-9-4.8 MCG/ACT AERO INHALE 2 PUFFS INTO THE LUNGS 2 TIMES DAILY. RINSE MOUTH AFTER USE TO AVOID THRUSH. 10.7 each 11   Cholecalciferol (VITAMIN D3) 25 MCG (1000 UT) capsule Take 1,000 Units by mouth daily.     COLLAGEN PO Take 1 Scoop by mouth daily. Powder mixed into smoothie     diclofenac  Sodium (VOLTAREN ) 1 % GEL APPLY 2 GRAMS TO AFFECTED AREA 4 TIMES A DAY (Patient not taking: Reported on 11/03/2023) 300 g 1   dolutegravir  (TIVICAY ) 50 MG tablet Take 1 tablet (50 mg total) by mouth daily. 30 tablet 5   emtricitabine -tenofovir  AF (DESCOVY ) 200-25 MG tablet Take 1 tablet by mouth daily. 30 tablet 5   estradiol  (CLIMARA  - DOSED IN MG/24 HR) 0.1 mg/24hr patch PLACE 1 PATCH (0.1 MG TOTAL) ONTO THE SKIN ONCE A WEEK. 4 patch 11   gabapentin  (NEURONTIN ) 100 MG  capsule TAKE 1 IN THE MORNING AND AT LUNCH FOR YOUR BACK PAIN. TAKE 3 AT NIGHT BEFORE YOU GO TO SLEEP. 150 capsule 0   losartan  (COZAAR ) 50 MG tablet Take 1 tablet (50 mg total) by mouth daily. 90 tablet 1   Melatonin 5 MG CAPS Take 1 capsule by mouth at bedtime. (Patient not taking: Reported on 11/03/2023)     mupirocin  ointment (BACTROBAN ) 2 % Apply 1 Application topically 2 (two) times daily. 22 g 0   Omega-3 Fatty Acids (FISH OIL) 300 MG CAPS Take by mouth.     rosuvastatin  (CRESTOR ) 10 MG tablet TAKE 1 TABLET BY MOUTH EVERY DAY 90 tablet 1   Semaglutide ,0.25 or 0.5MG /DOS, 2 MG/1.5ML SOPN Inject 0.25 mg into the skin once a week. 0.25 mg once weekly for 4 weeks then increase to 0.5 mg weekly for at least 4 weeks,max 1 mg (Patient not taking: Reported on  11/03/2023) 3 mL 0   traMADol  (ULTRAM ) 50 MG tablet Take 1 tablet (50 mg total) by mouth 3 (three) times daily as needed for moderate pain (pain score 4-6). Please make appointment before next refill. 90 tablet 0   No current facility-administered medications on file prior to visit.    Past Surgical History:  Procedure Laterality Date   APPENDECTOMY  1980's   BREAST EXCISIONAL BIOPSY Left 2001 per pt   cyst removed    CHOLECYSTECTOMY  1980's   CO2 LASER APPLICATION N/A 02/05/2020   Procedure: CO2 LASER APPLICATION OF VULVA (PERI-CLITORAL AND PERI-ANAL);  Surgeon: Viktoria Comer SAUNDERS, MD;  [facility]: [facility];  Service: Gynecology;  Laterality: N/A;   ECTOPIC PREGNANCY SURGERY  1985   injections to lower back  09/2017   LAPAROSCOPY N/A 07/03/2019   Procedure: LAPAROSCOPY DIAGNOSTIC; LYSIS OF ADHESIONS;  Surgeon: Viktoria Comer SAUNDERS, MD;  Location: [facility];  Service: Gynecology;  Laterality: N/A;   ROBOTIC ASSISTED TOTAL HYSTERECTOMY WITH BILATERAL SALPINGO OOPHERECTOMY N/A 07/03/2019   Procedure: XI ROBOTIC ASSISTED TOTAL HYSTERECTOMY WITH UNILATERAL SALPINGO OOPHORECTOMY;  Surgeon: Viktoria Comer SAUNDERS, MD;  Location: [facility];  Service: Gynecology;  Laterality: N/A;   SALIVARY GLAND SURGERY  1990   VULVECTOMY N/A 01/18/2018   Procedure: PARTIAL VULVECTOMY;  Surgeon: Devonna Sieving, MD;  Location: [facility];  Service: Gynecology;  Laterality: N/A;    Allergies  Allergen Reactions   Baclofen  Other (See Comments)    rash   Bactrim [Sulfamethoxazole-Trimethoprim] Rash   Doxycycline  Rash    BP 124/86   Ht 5' 6 (1.676 m)   Wt 190 lb (86.2 kg)   BMI 30.67 kg/m       No data to display              No data to display              Objective:  Physical Exam:  Gen: NAD, comfortable in exam room  Right knee No gross deformity, ecchymoses, swelling. TTP over pes anserine /medial joint line FROM with normal strength. Negative  ant/post drawers. Negative valgus/varus testing. Negative lachman.  Negative mcmurrays, equivocal Thessaly's NV intact distally.   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  Acute on chronic right knee pain Evaluated today for acute on chronic right knee pain.  No significant improvement with recent intra-articular corticosteroid injection.  Pain is located inferior and anterior to the medial joint line, over pes anserine.  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  Treatment options reviewed.  Physical therapy referral placed.  She endorses GI discomfort with meloxicam .  With this in mind I sent a new prescription for Celebrex  100 mg twice daily as needed for pain relief.  We discussed icing and continued use of a compression knee sleeve.  I will have her follow-up in 6 weeks for reassessment.  If there is no significant improvement in pain, consider corticosteroid injection.  - Physical therapy referral - Celebrex  100 mg twice daily as needed for pain relief - Continue icing and use of compression knee sleeve - Follow-up in 6 weeks

## 2023-11-10 NOTE — Patient Instructions (Addendum)
 I am concerned the pain in your knee is due to pes anserine  tendinopathy.  As we discussed, physical therapy is the mainstay of treatment for this condition.  I have also prescribed Celebrex  100 mg twice daily as needed for pain relief.  Avoid taking other NSAIDs while taking Celebrex .  You can continue icing and wearing a compression knee sleeve.  We will follow-up in 6 weeks for reassessment.  If there is no improvement we can discuss steroid injection.

## 2023-11-11 ENCOUNTER — Encounter: Payer: Self-pay | Admitting: Internal Medicine

## 2023-11-12 ENCOUNTER — Ambulatory Visit
Admission: RE | Admit: 2023-11-12 | Discharge: 2023-11-12 | Disposition: A | Discharge status: Home / Self Care | Source: Ambulatory Visit | Attending: Family Medicine | Admitting: Family Medicine

## 2023-11-12 DIAGNOSIS — R599 Enlarged lymph nodes, unspecified: Secondary | ICD-10-CM

## 2023-11-12 DIAGNOSIS — R590 Localized enlarged lymph nodes: Secondary | ICD-10-CM | POA: Diagnosis not present

## 2023-11-15 ENCOUNTER — Ambulatory Visit: Payer: 59

## 2023-11-15 VITALS — Ht 66.0 in | Wt 190.0 lb

## 2023-11-15 DIAGNOSIS — Z0000 Encounter for general adult medical examination without abnormal findings: Secondary | ICD-10-CM | POA: Diagnosis not present

## 2023-11-15 LAB — SURGICAL PATHOLOGY

## 2023-11-15 NOTE — Patient Instructions (Signed)
 [name],  Thank you for taking the time for your Medicare Wellness Visit. I appreciate your continued commitment to your health goals. Please review the care plan we discussed, and feel free to reach out if I can assist you further.  Medicare recommends these wellness visits once per year to help you and your care team stay ahead of potential health issues. These visits are designed to focus on prevention, allowing your provider to concentrate on managing your acute and chronic conditions during your regular appointments.  Please note that Annual Wellness Visits do not include a physical exam. Some assessments may be limited, especially if the visit was conducted virtually. If needed, we may recommend a separate in-person follow-up with your provider.  Ongoing Care Seeing your primary care provider every 3 to 6 months helps us  monitor your health and provide consistent, personalized care.   Referrals If a referral was made during today's visit and you haven't received any updates within two weeks, please contact the referred provider directly to check on the status.  Recommended Screenings:  Health Maintenance  Topic Date Due   Zoster (Shingles) Vaccine (1 of 2) Never done   DEXA scan (bone density measurement)  07/24/2023   Flu Shot  10/08/2023   COVID-19 Vaccine (4 - Pfizer risk 2024-25 season) 11/08/2023   Screening for Lung Cancer  10/07/2024   Medicare Annual Wellness Visit  11/14/2024   Mammogram  09/21/2025   Colon Cancer Screening  08/27/2032   DTaP/Tdap/Td vaccine (3 - Td or Tdap) 11/02/2032   Pneumococcal Vaccine for age over 26  Completed   Hepatitis B Vaccine  Completed   Hepatitis C Screening  Completed   HIV Screening  Completed   HPV Vaccine  Aged Out   Meningitis B Vaccine  Aged Out       11/15/2023   10:48 AM  Advanced Directives  Does Patient Have a Medical Advance Directive? No  Would patient like information on creating a medical advance directive? No -  Patient declined   Advance Care Planning is important because it: Ensures you receive medical care that aligns with your values, goals, and preferences. Provides guidance to your family and loved ones, reducing the emotional burden of decision-making during critical moments.  Vision: Annual vision screenings are recommended for early detection of glaucoma, cataracts, and diabetic retinopathy. These exams can also reveal signs of chronic conditions such as diabetes and high blood pressure.  Dental: Annual dental screenings help detect early signs of oral cancer, gum disease, and other conditions linked to overall health, including heart disease and diabetes.  Please see the attached documents for additional preventive care recommendations.

## 2023-11-15 NOTE — Progress Notes (Signed)
 Because this visit was a virtual/telehealth visit,  certain criteria was not obtained, such a blood pressure, CBG if applicable, and timed get up and go. Any medications not marked as taking were not mentioned during the medication reconciliation part of the visit. Any vitals not documented were not able to be obtained due to this being a telehealth visit or patient was unable to self-report a recent blood pressure reading due to a lack of equipment at home via telehealth. Vitals that have been documented are verbally provided by the patient.   Subjective:   Courtney Hamilton is a 63 y.o. who presents for a Medicare Wellness preventive visit.  As a reminder, Annual Wellness Visits don't include a physical exam, and some assessments may be limited, especially if this visit is performed virtually. We may recommend an in-person follow-up visit with your provider if needed.  Visit Complete: Virtual I connected with  Courtney Hamilton on 11/15/23 by a audio enabled telemedicine application and verified that I am speaking with the correct person using two identifiers.  Patient Location: Home  Provider Location: Home Office  I discussed the limitations of evaluation and management by telemedicine. The patient expressed understanding and agreed to proceed.  Vital Signs: Because this visit was a virtual/telehealth visit, some criteria may be missing or patient reported. Any vitals not documented were not able to be obtained and vitals that have been documented are patient reported.  VideoDeclined- This patient declined Librarian, academic. Therefore the visit was completed with audio only.  Persons Participating in Visit: Patient.  AWV Questionnaire: Yes: Patient Medicare AWV questionnaire was completed by the patient on 11/11/2023; I have confirmed that all information answered by patient is correct and no changes since this date.  Cardiac Risk Factors include: advanced age  (>58men, >88 women);family history of premature cardiovascular disease;hypertension;obesity (BMI >30kg/m2);sedentary lifestyle     Objective:    Today's Vitals   11/15/23 1036  Weight: 190 lb (86.2 kg)  Height: 5' 6 (1.676 m)  PainSc: 8   PainLoc: Knee   Body mass index is 30.67 kg/m.     11/15/2023   10:48 AM 10/01/2023    1:39 PM 09/15/2023   11:04 AM 08/04/2023   11:04 AM 06/22/2023   10:30 AM 12/22/2022    2:29 PM 12/11/2022    8:34 AM  Advanced Directives  Does Patient Have a Medical Advance Directive? No No No No No No No  Would patient like information on creating a medical advance directive? No - Patient declined No - Patient declined No - Patient declined No - Patient declined No - Patient declined No - Patient declined No - Patient declined    Current Medications (verified) Outpatient Encounter Medications as of 11/15/2023  Medication Sig   albuterol  (VENTOLIN  HFA) 108 (90 Base) MCG/ACT inhaler TAKE 2 PUFFS BY MOUTH EVERY 6 HOURS AS NEEDED FOR WHEEZE OR SHORTNESS OF BREATH   Ascorbic Acid (VITAMIN C) 1000 MG tablet Take 1,000 mg by mouth at bedtime.   Black Cohosh-Soy-Ginkgo-Magnol (ESTROVEN MOOD & MEMORY PO) Take 1 tablet by mouth daily.   BREZTRI  AEROSPHERE 160-9-4.8 MCG/ACT AERO INHALE 2 PUFFS INTO THE LUNGS 2 TIMES DAILY. RINSE MOUTH AFTER USE TO AVOID THRUSH.   celecoxib  (CELEBREX ) 100 MG capsule Take 1 capsule (100 mg total) by mouth 2 (two) times daily as needed for mild pain (pain score 1-3) or moderate pain (pain score 4-6).   Cholecalciferol (VITAMIN D3) 25 MCG (1000 UT) capsule Take 1,000  Units by mouth daily.   COLLAGEN PO Take 1 Scoop by mouth daily. Powder mixed into smoothie   diclofenac  Sodium (VOLTAREN ) 1 % GEL APPLY 2 GRAMS TO AFFECTED AREA 4 TIMES A DAY (Patient not taking: Reported on 11/03/2023)   dolutegravir  (TIVICAY ) 50 MG tablet Take 1 tablet (50 mg total) by mouth daily.   emtricitabine -tenofovir  AF (DESCOVY ) 200-25 MG tablet Take 1 tablet by mouth  daily.   estradiol  (CLIMARA  - DOSED IN MG/24 HR) 0.1 mg/24hr patch PLACE 1 PATCH (0.1 MG TOTAL) ONTO THE SKIN ONCE A WEEK.   gabapentin  (NEURONTIN ) 100 MG capsule TAKE 1 IN THE MORNING AND AT LUNCH FOR YOUR BACK PAIN. TAKE 3 AT NIGHT BEFORE YOU GO TO SLEEP.   losartan  (COZAAR ) 50 MG tablet Take 1 tablet (50 mg total) by mouth daily.   Melatonin 5 MG CAPS Take 1 capsule by mouth at bedtime. (Patient not taking: Reported on 11/03/2023)   mupirocin  ointment (BACTROBAN ) 2 % Apply 1 Application topically 2 (two) times daily.   Omega-3 Fatty Acids (FISH OIL) 300 MG CAPS Take by mouth.   rosuvastatin  (CRESTOR ) 10 MG tablet TAKE 1 TABLET BY MOUTH EVERY DAY   Semaglutide ,0.25 or 0.5MG /DOS, 2 MG/1.5ML SOPN Inject 0.25 mg into the skin once a week. 0.25 mg once weekly for 4 weeks then increase to 0.5 mg weekly for at least 4 weeks,max 1 mg (Patient not taking: Reported on 11/03/2023)   traMADol  (ULTRAM ) 50 MG tablet Take 1 tablet (50 mg total) by mouth 3 (three) times daily as needed for moderate pain (pain score 4-6). Please make appointment before next refill.   No facility-administered encounter medications on file as of 11/15/2023.    Allergies (verified) Baclofen , Bactrim [sulfamethoxazole-trimethoprim], and Doxycycline    History: Past Medical History:  Diagnosis Date   Arthritis    right hip   Bronchitis last time  sept/oct 2021   Flu-like symptoms 05/25/2016   Fungal infection of skin of abdomen 06/16/2017   Hepatitis 1980's   NON A NON B tx for 1980's resolved   Herniated disc 10/07/2011   lower back   History of chronic kidney disease 1980's   resolved   HIV (human immunodeficiency virus infection) (HCC) 1985   Hypertension    Lower abdominal pain 10/08/2014   Lower GI bleed 05/10/2012   pt denies   Lower leg edema 12/10/2011   Neuropathy    resolved   Osteoporosis    Panic attacks 12/22/2013   Recurrent UTI 08/06/2011   Ruptured lumbar disc    Sciatica    right side    Substance abuse (HCC)    past hsitory  clean more than 20 years    TB (pulmonary tuberculosis) 1993   exposure, treated    Thickened endometrium    VIN III (vulvar intraepithelial neoplasia III)    Past Surgical History:  Procedure Laterality Date   APPENDECTOMY  1980's   BREAST EXCISIONAL BIOPSY Left 2001 per pt   cyst removed    CHOLECYSTECTOMY  1980's   CO2 LASER APPLICATION N/A 02/05/2020   Procedure: CO2 LASER APPLICATION OF VULVA (PERI-CLITORAL AND PERI-ANAL);  Surgeon: Viktoria Comer SAUNDERS, MD;  Location[facility];  Service: Gynecology;  Laterality: N/A;   ECTOPIC PREGNANCY SURGERY  1985   injections to lower back  09/2017   LAPAROSCOPY N/A 07/03/2019   Procedure: LAPAROSCOPY DIAGNOSTIC; LYSIS OF ADHESIONS;  Surgeon: Viktoria Comer SAUNDERS, MD;  Location: [facility];  Service: Gynecology;  Laterality: N/A;  ROBOTIC ASSISTED TOTAL HYSTERECTOMY WITH BILATERAL SALPINGO OOPHERECTOMY N/A 07/03/2019   Procedure: XI ROBOTIC ASSISTED TOTAL HYSTERECTOMY WITH UNILATERAL SALPINGO OOPHORECTOMY;  Surgeon: Viktoria Comer SAUNDERS, MD;  Location: [facility];  Service: Gynecology;  Laterality: N/A;   SALIVARY GLAND SURGERY  1990   VULVECTOMY N/A 01/18/2018   Procedure: PARTIAL VULVECTOMY;  Surgeon: Devonna Sieving, MD;  Location: [facility];  Service: Gynecology;  Laterality: N/A;   Family History  Problem Relation Age of Onset   Asthma Father    COPD Father    Cancer Mother    Diabetes Brother    Hypertension Brother    Diabetes Brother    Breast cancer Sister    Colon cancer Maternal Uncle    Social History   Socioeconomic History   Marital status: Married    Spouse name: Not on file   Number of children: Not on file   Years of education: Not on file   Highest education level: Associate degree: occupational, Scientist, product/process development, or vocational program  Occupational History   Not on file  Tobacco Use   Smoking status: Former    Current packs/day: 0.00    Average  packs/day: 1.5 packs/day for 41.2 years (61.7 ttl pk-yrs)    Types: Cigarettes    Start date: 03/10/1975    Quit date: 05/2016    Years since quitting: 7.5   Smokeless tobacco: Never   Tobacco comments:    Quit March 2018 - Pack Year estimate~40 pack-years  Vaping Use   Vaping status: Never Used  Substance and Sexual Activity   Alcohol use: No   Drug use: Not Currently    Types: Marijuana    Comment: past history of cocaine  and alcohol  none in 20 years   Sexual activity: Yes    Partners: Male    Comment: accepted condoms 02/2021  Other Topics Concern   Not on file  Social History Narrative   Not on file   Social Drivers of Health   Financial Resource Strain: Medium Risk (11/15/2023)   Overall Financial Resource Strain (CARDIA)    Difficulty of Paying Living Expenses: Somewhat hard  Food Insecurity: Food Insecurity Present (11/15/2023)   Hunger Vital Sign    Worried About Running Out of Food in the Last Year: Sometimes true    Ran Out of Food in the Last Year: Sometimes true  Transportation Needs: Unmet Transportation Needs (11/15/2023)   PRAPARE - Administrator, Civil Service (Medical): Yes    Lack of Transportation (Non-Medical): No  Physical Activity: Inactive (11/15/2023)   Exercise Vital Sign    Days of Exercise per Week: 0 days    Minutes of Exercise per Session: 30 min  Stress: Stress Concern Present (11/11/2023)   [employer] of Occupational Health - Occupational Stress Questionnaire    Feeling of Stress: Very much  Social Connections: Socially Integrated (11/15/2023)   Social Connection and Isolation Panel    Frequency of Communication with Friends and Family: Three times a week    Frequency of Social Gatherings with Friends and Family: More than three times a week    Attends Religious Services: 1 to 4 times per year    Active Member of Clubs or Organizations: No    Attends Banker Meetings: 1 to 4 times per year    Marital Status: Married     Tobacco Counseling Counseling given: Not Answered Tobacco comments: Quit March 2018 - Pack Year estimate~40 pack-years    Clinical Intake:  Pre-visit preparation completed: Yes  Pain : 0-10 Pain Score: 8  Pain Type: Chronic pain Pain Location: Knee Pain Orientation: Right     BMI - recorded: 30.67 Nutritional Status: BMI > 30  Obese Nutritional Risks: None Diabetes: No  Lab Results  Component Value Date   HGBA1C 5.9 10/01/2023   HGBA1C 6.1 04/26/2023   HGBA1C 5.9 (A) 12/22/2022     How often do you need to have someone help you when you read instructions, pamphlets, or other written materials from your doctor or pharmacy?: 1 - Never What is the last grade level you completed in school?: HSG  Interpreter Needed?: No  Information entered by :: Courtney Fuller, LPN.   Activities of Daily Living     11/15/2023   10:49 AM 11/11/2023    5:32 PM  In your present state of health, do you have any difficulty performing the following activities:  Hearing? 0 0  Vision? 0 0  Difficulty concentrating or making decisions? 1 1  Walking or climbing stairs? 0 0  Dressing or bathing? 0 0  Doing errands, shopping? 0 0  Preparing Food and eating ? N N  Using the Toilet? N N  In the past six months, have you accidently leaked urine? Y Y  Do you have problems with loss of bowel control? N N  Managing your Medications? N N  Managing your Finances? N N  Housekeeping or managing your Housekeeping? CINDERELLA CINDERELLA    Patient Care Team: Alba Sharper, MD as PCP - General (Family Medicine) Luiz Channel, MD as Consulting Physician (Infectious Diseases)  I have updated your Care Teams any recent Medical Services you may have received from other providers in the past year.     Assessment:   This is a routine wellness examination for Courtney Hamilton.  Hearing/Vision screen Hearing Screening - Comments:: Denies hearing difficulties.   Vision Screening - Comments:: Wears reading glasses -  not up to date with routine eye exams.   Goals Addressed             This Visit's Progress    11/15/2023: My goal is to lose 30 pounds by starting an exercise regimen, getting better sleep and getting some grief counseling,         Depression Screen     11/15/2023   10:39 AM 11/03/2023   11:29 AM 10/01/2023    1:40 PM 09/15/2023   11:03 AM 08/04/2023   11:00 AM 06/22/2023   10:30 AM 03/12/2023    2:13 PM  PHQ 2/9 Scores  PHQ - 2 Score 2 2 2 1 2     PHQ- 9 Score 5 7 6 5 11     Exception Documentation      Patient refusal Other- indicate reason in comment box  Not completed       Pt completed at last visit. Declines paperwork at this time.    Fall Risk     11/15/2023   10:39 AM 11/11/2023    5:32 PM 11/03/2023   11:29 AM 10/01/2023    1:44 PM 10/01/2023    1:39 PM  Fall Risk   Falls in the past year? 0 0 0 0 0  Number falls in past yr: 0 0 0 0 0  Injury with Fall? 0 0 0 0 0  Risk for fall due to : No Fall Risks  No Fall Risks    Follow up Falls evaluation completed  Falls evaluation completed      MEDICARE RISK  AT HOME:  Medicare Risk at Home Any stairs in or around the home?: No If so, are there any without handrails?: No Home free of loose throw rugs in walkways, pet beds, electrical cords, etc?: No Adequate lighting in your home to reduce risk of falls?: Yes Life alert?: No Use of a cane, walker or w/c?: No Grab bars in the bathroom?: No Shower chair or bench in shower?: No Elevated toilet seat or a handicapped toilet?: No  TIMED UP AND GO:  Was the test performed?  No  Cognitive Function: Declined/Normal: No cognitive concerns noted by patient or family. Patient alert, oriented, able to answer questions appropriately and recall recent events. No signs of memory loss or confusion.    11/15/2023   10:39 AM  MMSE - Mini Mental State Exam  Not completed: Unable to complete        11/15/2023   10:48 AM 11/06/2022   10:01 PM  6CIT Screen  What Year? 0 points 0 points   What month? 0 points 0 points  What time? 0 points 0 points  Count back from 20 0 points 0 points  Months in reverse 0 points 0 points  Repeat phrase 0 points 0 points  Total Score 0 points 0 points    Immunizations Immunization History  Administered Date(s) Administered   Hepatitis A 10/19/2011, 06/07/2012   Hepatitis B 10/19/2011, 12/02/2011, 06/07/2012   Influenza Split 12/22/2011   Influenza, Seasonal, Injecte, Preservative Fre 12/10/2022   Influenza,inj,Quad PF,6+ Mos 01/17/2014, 01/03/2015, 12/27/2015, 01/05/2018, 12/03/2018, 12/26/2019, 05/14/2022   Influenza-Unspecified 11/21/2016   PFIZER(Purple Top)SARS-COV-2 Vaccination 05/20/2019, 06/20/2019   PNEUMOCOCCAL CONJUGATE-20 10/01/2023   PPD Test 10/01/2011   Pfizer(Comirnaty)Fall Seasonal Vaccine 12 years and older 01/05/2023   Pneumococcal Conjugate-13 04/04/2018   Pneumococcal Polysaccharide-23 11/07/2009, 01/22/2016   Tdap 05/23/2007, 11/03/2022    Screening Tests Health Maintenance  Topic Date Due   Zoster Vaccines- Shingrix (1 of 2) Never done   DEXA SCAN  07/24/2023   Influenza Vaccine  10/08/2023   COVID-19 Vaccine (4 - Pfizer risk 2024-25 season) 11/08/2023   Lung Cancer Screening  10/07/2024   Medicare Annual Wellness (AWV)  11/14/2024   MAMMOGRAM  09/21/2025   Colonoscopy  08/27/2032   DTaP/Tdap/Td (3 - Td or Tdap) 11/02/2032   Pneumococcal Vaccine: 50+ Years  Completed   Hepatitis B Vaccines 19-59 Average Risk  Completed   Hepatitis C Screening  Completed   HIV Screening  Completed   HPV VACCINES  Aged Out   Meningococcal B Vaccine  Aged Out    Health Maintenance Items Addressed: Yes Patient aware of current care gaps. Patient is due for the following: Bone Density Scan, Flu, Covid-19 and Shingrix.   Additional Screening:  Vision Screening: Recommended annual ophthalmology exams for early detection of glaucoma and other disorders of the eye. Is the patient up to date with their annual eye exam?   No  Who is the provider or what is the name of the office in which the patient attends annual eye exams? N/A  Dental Screening: Recommended annual dental exams for proper oral hygiene  Community Resource Referral / Chronic Care Management: CRR required this visit?  No   CCM required this visit?  No   Plan:    I have personally reviewed and noted the following in the patient's chart:   Medical and social history Use of alcohol, tobacco or illicit drugs  Current medications and supplements including opioid prescriptions. Patient is not currently taking opioid prescriptions.  Functional ability and status Nutritional status Physical activity Advanced directives List of other physicians Hospitalizations, surgeries, and ER visits in previous 12 months Vitals Screenings to include cognitive, depression, and falls Referrals and appointments  In addition, I have reviewed and discussed with patient certain preventive protocols, quality metrics, and best practice recommendations. A written personalized care plan for preventive services as well as general preventive health recommendations were provided to patient.   Courtney LOISE Fuller, LPN   [id number]/03/[id number]   After Visit Summary: (MyChart) Due to this being a telephonic visit, the after visit summary with patients personalized plan was offered to patient via MyChart   Notes: Please refer to Routing Comments.

## 2023-11-16 NOTE — Telephone Encounter (Signed)
 Follow-up via subsequent imaging results and discussions with patient.

## 2023-11-17 ENCOUNTER — Ambulatory Visit: Payer: Self-pay | Admitting: Family Medicine

## 2023-11-17 NOTE — Telephone Encounter (Signed)
-----   Message from Otto Fairly sent at 11/15/2023  9:36 AM EDT -----  ----- Message ----- From: Interface, Lab In Three Zero One Sent: 11/15/2023   8:51 AM EDT To: Otto ONEIDA Fairly, MD

## 2023-11-17 NOTE — Telephone Encounter (Signed)
 Call patient regarding recent ultrasound and axillary lymph node biopsy.  She is already aware that the biopsy findings were negative for malignancy.  Also checked in about her recent diagnosis of squamous cell skin cancer.  She has an appointment with her dermatologist tomorrow to discuss treatment plans.  She also endorses some constipation currently and has been trying a stool softener.  Recommended she take MiraLAX and titrate up to having a large bowel movement.  Patient reports she would do this.  I have an appointment to follow-up with her next week on the 16th.

## 2023-11-18 ENCOUNTER — Encounter: Payer: Self-pay | Admitting: Dermatology

## 2023-11-18 ENCOUNTER — Ambulatory Visit (INDEPENDENT_AMBULATORY_CARE_PROVIDER_SITE_OTHER): Admitting: Dermatology

## 2023-11-18 VITALS — BP 144/87 | HR 50

## 2023-11-18 DIAGNOSIS — D099 Carcinoma in situ, unspecified: Secondary | ICD-10-CM

## 2023-11-18 DIAGNOSIS — A630 Anogenital (venereal) warts: Secondary | ICD-10-CM

## 2023-11-18 DIAGNOSIS — D045 Carcinoma in situ of skin of trunk: Secondary | ICD-10-CM | POA: Diagnosis not present

## 2023-11-18 NOTE — Progress Notes (Unsigned)
   Follow-Up Visit   Subjective  Courtney Hamilton is a 63 y.o. 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  Patient states that the lesion has been present for over 10 years. She feels like it has grown.   The following portions of the chart were reviewed this encounter and updated as appropriate: medications, allergies, medical history  Review of Systems:  No other skin or systemic complaints except as noted in HPI or Assessment and Plan.  Objective  Well appearing patient in no apparent distress; mood and affect are within normal limits.  A focused examination was performed of the following areas: Genital area  Relevant exam findings are noted in the Assessment and Plan.    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   The patient was counseled thoroughly regarding the plan for Mohs micrographic surgery to treat their skin cancer. We discussed the procedure in detail, including the removal of thin layers of tissue for examination under a microscope to ensure complete excision of the cancer while preserving as much healthy tissue as possible. The patient was informed that the surgery may take several hours, with potential additional stages if further tissue removal is necessary to achieve clear margins. We reviewed the risks, which include infection, scarring, and the possibility of requiring reconstructive techniques depending on the location and size of the defect. Reconstruction will occur after the tumor is fully cleared, and depending on the wound size and location, this may involve a flap, graft, linear closure, or sometimes healing by secondary intention. The patient was advised on preoperative and postoperative care,  including avoiding certain medications, managing pain, and caring for the surgical site to prevent complications. They were given an opportunity to ask questions, and all concerns were addressed. The patient expressed understanding and agreed to proceed with the scheduled Mohs surgery.  Return for Mohs surgery right genitocrural fold T4.  I, Rollene Gobble, RN, am acting as scribe for RUFUS CHRISTELLA HOLY, MD .   We spent 45 min reviewing records, taking the patient history, providing face to face care with the patient, sending prescriptions.  Documentation: I have reviewed the above documentation for accuracy and completeness, and I agree with the above.  RUFUS CHRISTELLA HOLY, MD

## 2023-11-18 NOTE — Patient Instructions (Signed)

## 2023-11-23 ENCOUNTER — Other Ambulatory Visit (HOSPITAL_COMMUNITY): Payer: Self-pay

## 2023-11-23 ENCOUNTER — Encounter: Payer: Self-pay | Admitting: Family Medicine

## 2023-11-23 ENCOUNTER — Other Ambulatory Visit: Payer: Self-pay

## 2023-11-23 ENCOUNTER — Ambulatory Visit (INDEPENDENT_AMBULATORY_CARE_PROVIDER_SITE_OTHER): Admitting: Family Medicine

## 2023-11-23 VITALS — BP 125/78 | HR 100 | Wt 189.4 lb

## 2023-11-23 DIAGNOSIS — K921 Melena: Secondary | ICD-10-CM

## 2023-11-23 DIAGNOSIS — M5441 Lumbago with sciatica, right side: Secondary | ICD-10-CM

## 2023-11-23 DIAGNOSIS — G8929 Other chronic pain: Secondary | ICD-10-CM | POA: Diagnosis not present

## 2023-11-23 DIAGNOSIS — G479 Sleep disorder, unspecified: Secondary | ICD-10-CM | POA: Diagnosis not present

## 2023-11-23 NOTE — Progress Notes (Signed)
 Benefits Investigation Started  Rejection: Refill Too Soon until 12/16/23. These meds were possibly filled at [facility].   Routed to: Donna/Courtney

## 2023-11-23 NOTE — Progress Notes (Signed)
    SUBJECTIVE:   CHIEF COMPLAINT / HPI: med managment  Sleep  Continues to have difficulty falling asleep Also wake up very early unintentionally Thinks only getting 4 hours of sleep per night Has tried gabapentin  sleep which has not helped Open to seeing sleep medicine  Stomach issues Feels like there is a blockage Ongoing throughout the day Stools have been small and hard Has been taking miralax This helped, had less pressure today Stool this AM was normal today, soft Eating fruit No blood in the stool No vomiting Stopped taking celebrex  Hx of candidal esophagitis Has some pain after eating Ongoing for years Some blood after wiping  Chronic sciatica Back pain doing well. No changes to tramadol   PERTINENT  PMH / PSH: HTN, COPD with asthma, Osteoporosis, HIV, Squamous cell skin cancer  OBJECTIVE:   BP 125/78   Pulse 100   Wt 189 lb 6.4 oz (85.9 kg)   SpO2 100%   BMI 30.57 kg/m   General: NAD, well appearing Neuro: A&O Respiratory: normal WOB on RA Extremities: Moving all 4 extremities equally Abdomen: soft, non-tender, no rebound or tenderness, no CVA tenderness Back: No paraspinal muscle tenderness  ASSESSMENT/PLAN:   Assessment & Plan Sleep disorder Chronic, ongoing for years. Multiple previous discussions regarding sleep hygiene and has trialed different medications. -Ambulatory referral to sleep medicine Hematochezia Intermittent ongoing for several months. History of hemorrhoids. Recently has been constipated. Referral to GI for hematochezia evaluation.  Chronic bilateral low back pain with right-sided sciatica Doing well on current dose of tramadol . Continue current pain regimen. Can do refills every 3 months.  Return in about 3 months (around 02/22/2024).  Courtney Provencal, MD [facility]

## 2023-11-23 NOTE — Patient Instructions (Addendum)
 It was great to see you! Thank you for allowing me to participate in your care!  Our plans for today:  - I have placed a referral to the stomach doctors. They should call you to schedule an appointment. - I have also placed a referral to sleep medicine. They should also call you to schedule an appointment. - I will see you again in 3 months.   Please arrive 15 minutes PRIOR to your next scheduled appointment time! If you do not, this affects OTHER patients' care.  Take care and seek immediate care sooner if you develop any concerns.   [name], MD, PGY-3 [facility] 2:20 PM 11/23/2023  [facility]

## 2023-11-23 NOTE — Assessment & Plan Note (Signed)
 Chronic, ongoing for years. Multiple previous discussions regarding sleep hygiene and has trialed different medications. -Ambulatory referral to sleep medicine

## 2023-11-23 NOTE — Assessment & Plan Note (Signed)
 Intermittent ongoing for several months. History of hemorrhoids. Recently has been constipated. Referral to GI for hematochezia evaluation.

## 2023-11-23 NOTE — Assessment & Plan Note (Addendum)
 Doing well on current dose of tramadol . Continue current pain regimen. Can do refills every 3 months.

## 2023-11-30 ENCOUNTER — Other Ambulatory Visit: Payer: Self-pay | Admitting: Family Medicine

## 2023-11-30 DIAGNOSIS — I10 Essential (primary) hypertension: Secondary | ICD-10-CM

## 2023-11-30 DIAGNOSIS — G8929 Other chronic pain: Secondary | ICD-10-CM

## 2023-11-30 MED ORDER — TRAMADOL HCL 50 MG PO TABS: 50.0000 mg | ORAL_TABLET | Freq: Three times a day (TID) | ORAL | 0 refills | Status: DC | PRN

## 2023-11-30 NOTE — Addendum Note (Signed)
 Addended by: GLENDIA DAMIEN SQUIBB on: 11/30/2023 08:12 AM   Modules accepted: Orders

## 2023-12-02 ENCOUNTER — Encounter: Payer: Self-pay | Admitting: Dermatology

## 2023-12-06 ENCOUNTER — Other Ambulatory Visit: Payer: Self-pay

## 2023-12-08 ENCOUNTER — Ambulatory Visit (INDEPENDENT_AMBULATORY_CARE_PROVIDER_SITE_OTHER): Admitting: Dermatology

## 2023-12-08 ENCOUNTER — Encounter: Payer: Self-pay | Admitting: Dermatology

## 2023-12-08 VITALS — BP 121/76 | HR 54 | Temp 97.3°F

## 2023-12-08 DIAGNOSIS — D0471 Carcinoma in situ of skin of right lower limb, including hip: Secondary | ICD-10-CM

## 2023-12-08 DIAGNOSIS — L578 Other skin changes due to chronic exposure to nonionizing radiation: Secondary | ICD-10-CM

## 2023-12-08 DIAGNOSIS — C4492 Squamous cell carcinoma of skin, unspecified: Secondary | ICD-10-CM

## 2023-12-08 DIAGNOSIS — L814 Other melanin hyperpigmentation: Secondary | ICD-10-CM | POA: Diagnosis not present

## 2023-12-08 NOTE — Progress Notes (Signed)
 Follow-Up Visit   Subjective  Courtney Hamilton is a 63 y.o. 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 Corey.   The following portions of the chart were reviewed this encounter and updated as appropriate: medications, allergies, medical history  Review of Systems:  No other skin or systemic complaints except as noted in HPI or Assessment and Plan.  Objective  Well appearing patient in no apparent distress; mood and affect are within normal limits.  A focused examination was performed of the following areas: Right genitocrural fold Relevant physical exam findings are noted in the Assessment and Plan.   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 No   Was the anticoagulation regimen changed prior to Mohs? No    Anesthesia: Anesthesia method: local infiltration Local anesthetic: lidocaine  1% WITH epi  Procedure Details: Timeout: pre-procedure verification complete Procedure Prep: patient was prepped and draped in usual sterile fashion Prep type: chlorhexidine  Biopsy accession number: [id number] Frozen section biopsy performed: No   Pre-Op diagnosis: squamous cell carcinoma SCC subtype: in situ MohsAIQ Surgical site (if tumor spans multiple areas, please select predominant area): anogenital Surgery side: right Surgical site (from skin exam): right genitocrural fold Pre-operative length (cm): 5.6 Pre-operative width (cm): 3 Indications for Mohs surgery: anatomic location where tissue conservation is critical and tumor size greater than 2 cm Previously treated? No    Micrographic Surgery Details: Post-operative length (cm):  9.2 Post-operative width (cm): 4 Number of Mohs stages: 1 Cumulative additional sections past 5 per stage: 0 Post surgery depth of defect: subcutaneous fat Is this a complex case (associate members only): No    Stage 1    Tumor features identified on Mohs section: no tumor identified    Depth of tumor invasion after stage: subcutaneous fat    Perineural invasion: no perineural invasion  Patient tolerance of procedure: tolerated well, no immediate complications  Reconstruction: Was the defect reconstructed?: No    Opioids: Did the patient receive a prescription for opioid/narcotic related to Mohs surgery?: No    Antibiotics: Does patient meet AHA guidelines for endocarditis?: No   Does patient meet AHA guidelines for orthopedic prophylaxis?: No   Were antibiotics given on the day of surgery?: No   Did surgery breach mucosa, expose cartilage/bone, involve an area of lymphedema/inflamed/infected tissue? No   Indication for post-operative antibiotics: anatomic location    Return in about 4 weeks (around 01/05/2024) for Mohs f/u.  LILLETTE Berwyn Lesches, Surg Tech III, am acting as scribe for RUFUS CHRISTELLA COREY, MD.    12/08/2023  HISTORY OF PRESENT ILLNESS  Courtney Hamilton is seen in consultation at the request of Dr. 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 They note that the area has been present for about 18 years increasing in size with time.  There is no history of previous treatment.  Reports no other new or changing lesions and has no other complaints today.  Medications and allergies: see patient chart.  Review of systems: Reviewed 8 systems and notable for the above skin cancer.  All other systems reviewed are unremarkable/negative, unless noted in the HPI. Past medical history, surgical history, family history, social history were also reviewed and  are noted in the chart/questionnaire.    PHYSICAL EXAMINATION  General:  Well-appearing, in no acute distress, alert and oriented x 4. Vitals reviewed in chart (if available).   Skin: Exam reveals a 5.6 x 3.0 cm erythematous papule and biopsy scar on the right genitocrural fold. There are rhytids, telangiectasias, and lentigines, consistent with photodamage.  Biopsy report(s) reviewed, confirming the diagnosis.   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 Due to location, size, histology, or recurrence and the likelihood of subclinical extension as well as the need to conserve normal surrounding tissue, the patient was deemed acceptable for Mohs micrographic surgery (MMS).  The nature and purpose of the procedure, associated benefits and risks including recurrence and scarring, possible complications such as pain, infection, and bleeding, and alternative methods of treatment if appropriate were discussed with the patient during consent. The lesion location was verified by the patient, by reviewing previous notes, pathology reports, and by photographs as well as angulation measurements if available.  Informed consent was reviewed and signed by the patient, and timeout was performed at 8:15 AM. See op note below.  2. For the photodamage and solar lentigines, sun protection discussed/information given on OTC sunscreens, and we recommend continued regular follow-up with primary dermatologist every 6 months or sooner for any growing, bleeding, or changing lesions. 3. Prognosis and future surveillance discussed. 4. Letter with treatment outcome sent to referring provider. 5. Pain acetaminophen /ibuprofen   MOHS MICROGRAPHIC SURGERY AND RECONSTRUCTION  Initial size:   5.6 x 3.0 cm Surgical defect/wound size: 9.0 x 4.2 cm Anesthesia:    0.33% lidocaine  with 1:200,000 epinephrine EBL:    <5 mL Complications:  None Repair type:   Second Intention   Stages: 1  STAGE I: Anesthesia  achieved with 0.5% lidocaine  with 1:200,000 epinephrine. ChloraPrep applied. 6 section(s) excised using Mohs technique (this includes total peripheral and deep tissue margin excision and evaluation with frozen sections, excised and interpreted by the same physician). The tumor was first debulked and then excised with an approx. 2mm margin.  Hemostasis was achieved with electrocautery as needed.  The specimen was then oriented, subdivided/relaxed, inked, and processed using Mohs technique.    Frozen section analysis revealed a clear deep and peripheral margin.   Reconstruction  Patient was notified of results and repair options were discussed, including second intention healing. After reviewing the advantages and disadvantages of each, we agreed on second intention healing as appropriate.   The surgical site was then lightly scrubbed with sterile, saline-soaked gauze.  The area was bandaged using Vaseline ointment, non-adherent gauze, gauze pads, and tape to provide an adequate pressure dressing.   The patient tolerated the procedure well, was given detailed written and verbal wound care instructions, and was discharged in good condition.  The patient will follow-up in 4 weeks and as scheduled with primary dermatologist.    Documentation: I have reviewed the above documentation for accuracy and completeness, and I agree with the above.  RUFUS CHRISTELLA HOLY, MD

## 2023-12-08 NOTE — Patient Instructions (Signed)

## 2023-12-09 ENCOUNTER — Encounter: Payer: Self-pay | Admitting: Dermatology

## 2023-12-16 ENCOUNTER — Ambulatory Visit: Admitting: Dermatology

## 2023-12-17 ENCOUNTER — Other Ambulatory Visit: Payer: Self-pay

## 2023-12-17 ENCOUNTER — Other Ambulatory Visit (HOSPITAL_COMMUNITY): Payer: Self-pay

## 2023-12-17 NOTE — Progress Notes (Signed)
 Specialty Pharmacy Refill Coordination Note  Spoke with Courtney Hamilton  Courtney Hamilton is a 63 y.o. female contacted today regarding refills of specialty medication(s) Dolutegravir  Sodium (Tivicay ); Emtricitabine -Tenofovir  AF (Descovy )  Doses on hand: 17  Patient requested: Delivery   Delivery date: 12/31/23   Verified address: [address] [place] [address]  Medication will be filled on 12/30/23.

## 2023-12-22 ENCOUNTER — Ambulatory Visit: Admitting: Internal Medicine

## 2023-12-23 ENCOUNTER — Other Ambulatory Visit: Payer: Self-pay | Admitting: Family Medicine

## 2023-12-23 DIAGNOSIS — G8929 Other chronic pain: Secondary | ICD-10-CM

## 2023-12-24 ENCOUNTER — Encounter: Payer: Self-pay | Admitting: Family Medicine

## 2023-12-27 ENCOUNTER — Other Ambulatory Visit: Payer: Self-pay

## 2023-12-28 ENCOUNTER — Encounter: Payer: Self-pay | Admitting: Family Medicine

## 2023-12-28 DIAGNOSIS — G8929 Other chronic pain: Secondary | ICD-10-CM

## 2023-12-30 MED ORDER — TRAMADOL HCL 50 MG PO TABS: 50.0000 mg | ORAL_TABLET | Freq: Three times a day (TID) | ORAL | 0 refills | Status: DC | PRN

## 2024-01-05 ENCOUNTER — Encounter: Payer: Self-pay | Admitting: Dermatology

## 2024-01-05 ENCOUNTER — Ambulatory Visit (INDEPENDENT_AMBULATORY_CARE_PROVIDER_SITE_OTHER): Admitting: Dermatology

## 2024-01-05 VITALS — BP 130/88 | HR 57

## 2024-01-05 DIAGNOSIS — S31502D Unspecified open wound of unspecified external genital organs, female, subsequent encounter: Secondary | ICD-10-CM | POA: Diagnosis not present

## 2024-01-05 DIAGNOSIS — Z86007 Personal history of in-situ neoplasm of skin: Secondary | ICD-10-CM | POA: Diagnosis not present

## 2024-01-05 DIAGNOSIS — C4492 Squamous cell carcinoma of skin, unspecified: Secondary | ICD-10-CM

## 2024-01-05 DIAGNOSIS — T1490XD Injury, unspecified, subsequent encounter: Secondary | ICD-10-CM

## 2024-01-05 NOTE — Progress Notes (Signed)
   Follow Up Visit   Subjective  Courtney Hamilton is a 63 y.o. female who presents for the following: follow up from Mohs surgery   The patient presents for follow up from Mohs surgery for a SCC on the right genitocrural fold, treated on 12/08/2023, healing by second intention. The patient has been bandaging the wound as directed. The endorse the following concerns: want's to make sure it's healing correctly and pain level 1-10 right now she is at a 7. Patient states she showers in the morning and washes the wound twice a day and put Vaseline and bandage  The following portions of the chart were reviewed this encounter and updated as appropriate: medications, allergies, medical history  Review of Systems:  No other skin or systemic complaints except as noted in HPI or Assessment and Plan.  Objective  Well appearing patient in no apparent distress; mood and affect are within normal limits.  A focal examination was performed including the genitocrural area. All findings within normal limits unless otherwise noted below.  Healing wound with mild erythema  Relevant physical exam findings are noted in the Assessment and Plan.      Assessment & Plan   Healing Wound s/p Mohs for a SCCIS on the right genitocrural fold, treated on 01/05/2024, healing by second intention.  - Reassured that wound is healing well - No evidence of infection - No swelling, induration, purulence, dehiscence, or tenderness out of proportion to the clinical exam, see photo above - Discussed that scars take up to 12 months to mature from the date of surgery - Discussed that erythema and raised appearance of scar will fade over the next 4-6 months - OK to start scar massage at 4-6 weeks post-op - Can consider silicone based products for scar healing starting at 6 weeks post-op - Ok to continue ointment daily to wound under a bandage for another 6 weeks  HISTORY OF SQUAMOUS CELL CARCINOMA IN SITU OF THE SKIN - No  evidence of recurrence today - Recommend regular full body skin exams - Recommend daily broad spectrum sunscreen SPF 30+ to sun-exposed areas, reapply every 2 hours as needed.  - Call if any new or changing lesions are noted between office visits   Return in about 4 weeks (around 02/02/2024) for woud check follow up.  LILLETTE Rollene Gobble, RN, am acting as scribe for RUFUS CHRISTELLA HOLY, MD .   Documentation: I have reviewed the above documentation for accuracy and completeness, and I agree with the above.  RUFUS CHRISTELLA HOLY, MD

## 2024-01-05 NOTE — Patient Instructions (Signed)

## 2024-01-16 ENCOUNTER — Other Ambulatory Visit: Payer: Self-pay | Admitting: Internal Medicine

## 2024-01-16 DIAGNOSIS — G8929 Other chronic pain: Secondary | ICD-10-CM

## 2024-01-20 ENCOUNTER — Other Ambulatory Visit: Payer: Self-pay

## 2024-01-25 ENCOUNTER — Ambulatory Visit (INDEPENDENT_AMBULATORY_CARE_PROVIDER_SITE_OTHER): Admitting: Family Medicine

## 2024-01-25 ENCOUNTER — Encounter: Payer: Self-pay | Admitting: Family Medicine

## 2024-01-25 ENCOUNTER — Other Ambulatory Visit (HOSPITAL_COMMUNITY): Payer: Self-pay

## 2024-01-25 VITALS — BP 118/74 | HR 51 | Ht 66.0 in | Wt 195.4 lb

## 2024-01-25 DIAGNOSIS — M7541 Impingement syndrome of right shoulder: Secondary | ICD-10-CM | POA: Diagnosis not present

## 2024-01-25 DIAGNOSIS — E669 Obesity, unspecified: Secondary | ICD-10-CM

## 2024-01-25 MED ORDER — METHYLPREDNISOLONE ACETATE 40 MG/ML IJ SUSP
40.0000 mg | Freq: Once | INTRAMUSCULAR | Status: AC
  Administered 2024-01-25: 40 mg via INTRAMUSCULAR

## 2024-01-25 NOTE — Assessment & Plan Note (Signed)
 Has made multiple dietary changes over the past year and discussion with me.  Has a difficulty maintaining an exercise regimen secondary to ongoing medical issues.  Does not have indication for GLP-1. - Discussed with patient referral to healthy weight and wellness, placed referral

## 2024-01-25 NOTE — Assessment & Plan Note (Signed)
 Efficacy of last injection has worn off.  Today with continued subacromial impingement.  Injection performed as today below.  May continue to use Voltaren  gel  Procedure performed: subacromial corticosteroid injection; palpation guided  Consent obtained and verified. Time-out conducted. Noted no overlying erythema, induration, or other signs of local infection. The right posterior subacromial space was palpated and marked. The overlying skin was prepped in a sterile fashion. Topical analgesic spray: Ethyl chloride. Joint: Right subacromial Needle: 25 gauge, 1.5 inch Completed without difficulty. Meds: 4 cc 1% lidocaine  without epinephrine, 40 mg depo-medrol    Advised to call if fevers/chills, erythema, induration, drainage, or persistent bleeding.

## 2024-01-25 NOTE — Patient Instructions (Addendum)
 It was great to see you! Thank you for allowing me to participate in your care!  Our plans for today:   Please get your dexa scan over at the [facility] imaging center. You can call the neurology office to schedule your sleep evaluation. Your next colonoscopy is due in 2027. You should get a call for healthy weight and wellness clinic.  Today you received an injection with corticosteroid. This injection is usually done in response to pain and inflammation. There is some numbing medicine also in the shot so the injected area may be numb and feel really good for the next couple of hours. The numbing medicine usually wears off in 2-3 hours though, and then your pain level will be right back where it was before the injection.   The actually benefit from the steroid injection is usually noticed in 2-7 days. You may actually experience a small (as in 10%) INCREASE in pain in the first 24 hours---that is common.   Things to watch out for that you should contact us  or a health care provider urgently would include: 1. Unusual (as in more than 10%) increase in pain 2. New fever > 101.5 3. New swelling or redness of the injected area.  4. Streaking of red lines around the area injected.    Please arrive 15 minutes PRIOR to your next scheduled appointment time! If you do not, this affects OTHER patients' care.  Take care and seek immediate care sooner if you develop any concerns.   [name], MD, PGY-3 [facility] 10:48 AM 01/25/2024  [facility]

## 2024-01-25 NOTE — Progress Notes (Signed)
    SUBJECTIVE:   CHIEF COMPLAINT / HPI: shoulder injection  Discussed the use of AI scribe software for clinical note transcription with the patient, who gave verbal consent to proceed.  History of Present Illness Right shoulder voltaren  gel has helped some Chronic OA and impingement Would like another injection Last over 3 months ago.  Continues to have difficulty with weight loss Upset that weight is up today. Eats avocado toast in morning Has done better with popcorn snacking Eats chicken and fish for dinner Has had less activity due to Mohs surgery in inguinal/buttock area  Has not dropped off colonoscopy records. Will mail them.    PERTINENT  PMH / PSH: HTN, COPD with asthma, Osteoporosis, HIV, Squamous cell skin cancer, Chronic Low back pain  OBJECTIVE:   BP 118/74   Pulse (!) 51   Ht 5' 6 (1.676 m)   Wt 195 lb 6.4 oz (88.6 kg)   SpO2 100%   BMI 31.54 kg/m   Physical Exam General: NAD, well appearing Neuro: A&O Respiratory: normal WOB on RA Extremities: Moving all 4 extremities equally Right shoulder: no erythema/deformity, mildly tender to palpation, gross full ROM, positive hawkins, neers, cross arm, negative empty can   ASSESSMENT/PLAN:   Assessment & Plan Subacromial impingement of right shoulder Efficacy of last injection has worn off.  Today with continued subacromial impingement.  Injection performed as today below.  May continue to use Voltaren  gel  Procedure performed: subacromial corticosteroid injection; palpation guided  Consent obtained and verified. Time-out conducted. Noted no overlying erythema, induration, or other signs of local infection. The right posterior subacromial space was palpated and marked. The overlying skin was prepped in a sterile fashion. Topical analgesic spray: Ethyl chloride. Joint: Right subacromial Needle: 25 gauge, 1.5 inch Completed without difficulty. Meds: 4 cc 1% lidocaine  without epinephrine, 40 mg  depo-medrol    Advised to call if fevers/chills, erythema, induration, drainage, or persistent bleeding. Obesity (BMI 30-39.9) Has made multiple dietary changes over the past year and discussion with me.  Has a difficulty maintaining an exercise regimen secondary to ongoing medical issues.  Does not have indication for GLP-1. - Discussed with patient referral to healthy weight and wellness, placed referral  We discussed getting DEXA scan with patient. Note colonoscopy due in 2027, not 2034 as indicated in epic Recommend she call back to sleep center to schedule evaluation.  Return in about 3 months (around 04/26/2024).  Courtney Provencal, MD, PGY-3 [facility] 11:04 AM 01/25/2024  [facility]

## 2024-01-27 ENCOUNTER — Encounter (INDEPENDENT_AMBULATORY_CARE_PROVIDER_SITE_OTHER): Payer: Self-pay

## 2024-01-27 ENCOUNTER — Other Ambulatory Visit (HOSPITAL_COMMUNITY): Payer: Self-pay

## 2024-01-27 ENCOUNTER — Encounter: Payer: Self-pay | Admitting: Family Medicine

## 2024-01-27 DIAGNOSIS — G8929 Other chronic pain: Secondary | ICD-10-CM

## 2024-01-28 MED ORDER — TRAMADOL HCL 50 MG PO TABS: 50.0000 mg | ORAL_TABLET | Freq: Three times a day (TID) | ORAL | 0 refills | Status: DC | PRN

## 2024-01-29 ENCOUNTER — Other Ambulatory Visit: Payer: Self-pay | Admitting: Family Medicine

## 2024-01-29 DIAGNOSIS — G8929 Other chronic pain: Secondary | ICD-10-CM

## 2024-02-02 ENCOUNTER — Ambulatory Visit: Admitting: Dermatology

## 2024-02-06 ENCOUNTER — Other Ambulatory Visit: Payer: Self-pay | Admitting: Family Medicine

## 2024-02-06 DIAGNOSIS — K625 Hemorrhage of anus and rectum: Secondary | ICD-10-CM

## 2024-02-13 ENCOUNTER — Other Ambulatory Visit: Payer: Self-pay | Admitting: Family Medicine

## 2024-02-13 DIAGNOSIS — K649 Unspecified hemorrhoids: Secondary | ICD-10-CM

## 2024-02-13 DIAGNOSIS — K625 Hemorrhage of anus and rectum: Secondary | ICD-10-CM

## 2024-02-21 ENCOUNTER — Encounter: Payer: Self-pay | Admitting: Dermatology

## 2024-02-21 ENCOUNTER — Ambulatory Visit: Admitting: Dermatology

## 2024-02-21 DIAGNOSIS — L905 Scar conditions and fibrosis of skin: Secondary | ICD-10-CM

## 2024-02-21 DIAGNOSIS — D099 Carcinoma in situ, unspecified: Secondary | ICD-10-CM

## 2024-02-21 DIAGNOSIS — Z86007 Personal history of in-situ neoplasm of skin: Secondary | ICD-10-CM

## 2024-02-21 DIAGNOSIS — L539 Erythematous condition, unspecified: Secondary | ICD-10-CM

## 2024-02-21 NOTE — Progress Notes (Signed)
° °  Follow Up Visit   Subjective  Courtney Hamilton is a 63 y.o. female who presents for the following: follow up from Mohs surgery   The patient presents for follow up from Mohs surgery for a CIS on the right genitocrural fold, treated on 12/08/23, repaired with 2nd intention. The patient has been bandaging the wound as directed. The endorse the following concerns: none  The healing process has been progressing well since her last visit approximately six weeks ago. Initially, she used larger surgical bandages but has since transitioned to smaller ones as the larger ones were too big, although they were comfortable because they stayed in place. She no longer feels the need to 'walk like a penguin' due to the bandages.  The area is slightly tender with itching around the outside of the scar. She has been using Vaseline to maintain moisture and has been experiencing dryness and scaling of the scar.  Objective  Well appearing patient in no apparent distress; mood and affect are within normal limits.  A focal examination was performed including scalp, head, face and right genitocrural fold. All findings within normal limits unless otherwise noted below.  Healing wound with mild erythema  Relevant physical exam findings are noted in the Assessment and Plan.    Assessment & Plan   Scar s/p Mohs for CIS on the right inguinal upper thigh/fold, treated on 12/08/23, healed by 2nd intention - Reassured that wound is healing well - No evidence of infection - No swelling, induration, purulence, dehiscence, or tenderness out of proportion to the clinical exam, see photo above - Discussed that scars take up to 12 months to mature from the date of surgery - Recommend SPF 30+ to scar daily to prevent purple color from UV exposure during scar maturation process - Discussed that erythema and raised appearance of scar will fade over the next 4-6 months - OK to start scar massage at 4-6 weeks post-op - Can  consider silicone based products for scar healing starting at 6 weeks post-op  HISTORY OF SQUAMOUS CELL CARCINOMA IN SITU OF THE SKIN - No evidence of recurrence today - Recommend regular full body skin exams - Recommend daily broad spectrum sunscreen SPF 30+ to sun-exposed areas, reapply every 2 hours as needed.  - Call if any new or changing lesions are noted between office visits  Return in about 6 months (around 08/21/2024) for TBSE, consultation with dr. alm for skin care regimen.  I, Darice Smock, CMA, am acting as scribe for RUFUS CHRISTELLA HOLY, MD.   Documentation: I have reviewed the above documentation for accuracy and completeness, and I agree with the above.  RUFUS CHRISTELLA HOLY, MD

## 2024-02-21 NOTE — Patient Instructions (Signed)

## 2024-02-23 ENCOUNTER — Other Ambulatory Visit: Payer: Self-pay | Admitting: Family Medicine

## 2024-02-23 DIAGNOSIS — G8929 Other chronic pain: Secondary | ICD-10-CM

## 2024-02-25 ENCOUNTER — Other Ambulatory Visit: Payer: Self-pay

## 2024-02-25 MED ORDER — ROSUVASTATIN CALCIUM 10 MG PO TABS: 10.0000 mg | ORAL_TABLET | Freq: Every day | ORAL | 1 refills | Status: AC

## 2024-02-28 MED ORDER — TRAMADOL HCL 50 MG PO TABS: 50.0000 mg | ORAL_TABLET | Freq: Three times a day (TID) | ORAL | 0 refills | Status: DC | PRN

## 2024-02-28 NOTE — Addendum Note (Signed)
 Addended by: ALBA SHARPER on: 02/28/2024 09:10 AM   Modules accepted: Orders

## 2024-03-05 ENCOUNTER — Other Ambulatory Visit: Payer: Self-pay | Admitting: Student

## 2024-03-05 DIAGNOSIS — G8929 Other chronic pain: Secondary | ICD-10-CM

## 2024-03-07 ENCOUNTER — Ambulatory Visit: Payer: Self-pay | Admitting: Family Medicine

## 2024-03-07 ENCOUNTER — Emergency Department (HOSPITAL_COMMUNITY)
Admission: EM | Admit: 2024-03-07 | Discharge: 2024-03-07 | Disposition: A | Discharge status: Home / Self Care | Attending: Emergency Medicine | Admitting: Emergency Medicine

## 2024-03-07 ENCOUNTER — Other Ambulatory Visit: Payer: Self-pay

## 2024-03-07 ENCOUNTER — Encounter (HOSPITAL_COMMUNITY): Payer: Self-pay

## 2024-03-07 ENCOUNTER — Emergency Department (HOSPITAL_COMMUNITY)

## 2024-03-07 DIAGNOSIS — J441 Chronic obstructive pulmonary disease with (acute) exacerbation: Secondary | ICD-10-CM | POA: Diagnosis not present

## 2024-03-07 DIAGNOSIS — R0602 Shortness of breath: Secondary | ICD-10-CM | POA: Diagnosis present

## 2024-03-07 DIAGNOSIS — Z21 Asymptomatic human immunodeficiency virus [HIV] infection status: Secondary | ICD-10-CM | POA: Insufficient documentation

## 2024-03-07 LAB — COMPREHENSIVE METABOLIC PANEL WITH GFR
ALT: 11 U/L (ref 0–44)
AST: 28 U/L (ref 15–41)
Albumin: 4.4 g/dL (ref 3.5–5.0)
Alkaline Phosphatase: 65 U/L (ref 38–126)
Anion gap: 12 (ref 5–15)
BUN: 12 mg/dL (ref 8–23)
CO2: 24 mmol/L (ref 22–32)
Calcium: 9.3 mg/dL (ref 8.9–10.3)
Chloride: 102 mmol/L (ref 98–111)
Creatinine, Ser: 0.94 mg/dL (ref 0.44–1.00)
GFR, Estimated: >60 mL/min
Glucose, Bld: 150 mg/dL — ABNORMAL HIGH (ref 70–99)
Potassium: 4 mmol/L (ref 3.5–5.1)
Sodium: 138 mmol/L (ref 135–145)
Total Bilirubin: 0.5 mg/dL (ref 0.0–1.2)
Total Protein: 7.8 g/dL (ref 6.5–8.1)

## 2024-03-07 LAB — CBC WITH DIFFERENTIAL/PLATELET
Abs Immature Granulocytes: 0.04 K/uL (ref 0.00–0.07)
Basophils Absolute: 0.1 K/uL (ref 0.0–0.1)
Basophils Relative: 1 %
Eosinophils Absolute: 0.1 K/uL (ref 0.0–0.5)
Eosinophils Relative: 1 %
HCT: 43.4 % (ref 36.0–46.0)
Hemoglobin: 13.9 g/dL (ref 12.0–15.0)
Immature Granulocytes: 0 %
Lymphocytes Relative: 15 %
Lymphs Abs: 1.5 K/uL (ref 0.7–4.0)
MCH: 29.4 pg (ref 26.0–34.0)
MCHC: 32 g/dL (ref 30.0–36.0)
MCV: 91.8 fL (ref 80.0–100.0)
Monocytes Absolute: 0.9 K/uL (ref 0.1–1.0)
Monocytes Relative: 9 %
Neutro Abs: 7.6 K/uL (ref 1.7–7.7)
Neutrophils Relative %: 74 %
Platelets: 253 K/uL (ref 150–400)
RBC: 4.73 MIL/uL (ref 3.87–5.11)
RDW: 12.9 % (ref 11.5–15.5)
WBC: 10.1 K/uL (ref 4.0–10.5)
nRBC: 0 % (ref 0.0–0.2)

## 2024-03-07 LAB — TROPONIN T, HIGH SENSITIVITY: Troponin T High Sensitivity: <15 ng/L (ref 0–19)

## 2024-03-07 LAB — PRO BRAIN NATRIURETIC PEPTIDE: Pro Brain Natriuretic Peptide: 115 pg/mL

## 2024-03-07 LAB — RESP PANEL BY RT-PCR (RSV, FLU A&B, COVID)  RVPGX2
Influenza A by PCR: NEGATIVE
Influenza B by PCR: NEGATIVE
Resp Syncytial Virus by PCR: NEGATIVE
SARS Coronavirus 2 by RT PCR: NEGATIVE

## 2024-03-07 MED ORDER — METOCLOPRAMIDE HCL 5 MG/ML IJ SOLN
5.0000 mg | Freq: Once | INTRAMUSCULAR | Status: AC
  Administered 2024-03-07: 5 mg via INTRAVENOUS
  Filled 2024-03-07: qty 2

## 2024-03-07 MED ORDER — PREDNISONE 20 MG PO TABS: 40.0000 mg | ORAL_TABLET | Freq: Every day | ORAL | 0 refills | Status: DC

## 2024-03-07 MED ORDER — ALBUTEROL SULFATE (2.5 MG/3ML) 0.083% IN NEBU
10.0000 mg | INHALATION_SOLUTION | RESPIRATORY_TRACT | Status: DC
  Administered 2024-03-07: 10 mg via RESPIRATORY_TRACT
  Filled 2024-03-07: qty 12

## 2024-03-07 MED ORDER — AZITHROMYCIN 250 MG PO TABS: 250.0000 mg | ORAL_TABLET | Freq: Every day | ORAL | 0 refills | Status: DC

## 2024-03-07 MED ORDER — SODIUM CHLORIDE 0.9 % IV BOLUS
1000.0000 mL | Freq: Once | INTRAVENOUS | Status: AC
  Administered 2024-03-07: 1000 mL via INTRAVENOUS

## 2024-03-07 MED ORDER — DIPHENHYDRAMINE HCL 50 MG/ML IJ SOLN
12.5000 mg | Freq: Once | INTRAMUSCULAR | Status: AC
  Administered 2024-03-07: 12.5 mg via INTRAVENOUS
  Filled 2024-03-07: qty 1

## 2024-03-07 MED ORDER — ACETAMINOPHEN 500 MG PO TABS
1000.0000 mg | ORAL_TABLET | Freq: Once | ORAL | Status: AC
  Administered 2024-03-07: 1000 mg via ORAL
  Filled 2024-03-07: qty 2

## 2024-03-07 NOTE — ED Notes (Signed)
 Pt ambulatory to the restroom on room air. SPO2 maintained 91-94%

## 2024-03-07 NOTE — ED Provider Notes (Signed)
 " [place] EMERGENCY DEPARTMENT AT [facility] Provider Note   CSN: [id number] Arrival date & time: 03/07/24  9495     Patient presents with: Respiratory Distress   Nayelli Hands is a 63 y.o. female.   63 yo F with a cc of breathing.  Going on since yesterday.  Cough congestion no reported fevers.  Has developed a bit of a headache.  She has some grandchildren that live across the street that she thinks may be sick.  Otherwise denies sick contacts.  Denies chest pain or pressure.  Denies abdominal pain.  Tried a breathing treatment at home but without much improvement.  Was picked up by EMS and given 2 DuoNebs Solu-Medrol  and magnesium  with some improvement.        Prior to Admission medications  Medication Sig Start Date End Date Taking? Authorizing Provider  albuterol  (VENTOLIN  HFA) 108 (90 Base) MCG/ACT inhaler TAKE 2 PUFFS BY MOUTH EVERY 6 HOURS AS NEEDED FOR WHEEZE OR SHORTNESS OF BREATH 05/18/23   Alba Sharper, MD  Ascorbic Acid (VITAMIN C) 1000 MG tablet Take 1,000 mg by mouth at bedtime.    [provider]  Black Cohosh-Soy-Ginkgo-Magnol (ESTROVEN MOOD & MEMORY PO) Take 1 tablet by mouth daily.    [provider]  BREZTRI  AEROSPHERE 160-9-4.8 MCG/ACT AERO INHALE 2 PUFFS INTO THE LUNGS 2 TIMES DAILY. RINSE MOUTH AFTER USE TO AVOID THRUSH. 05/18/23   Alba Sharper, MD  celecoxib  (CELEBREX ) 100 MG capsule Take 1 capsule (100 mg total) by mouth 2 (two) times daily as needed for mild pain (pain score 1-3) or moderate pain (pain score 4-6). 11/10/23   Dixon, Phillip E, MD  Cholecalciferol (VITAMIN D3) 25 MCG (1000 UT) capsule Take 1,000 Units by mouth daily.    [provider]  COLLAGEN PO Take 1 Scoop by mouth daily. Powder mixed into smoothie    [provider]  diclofenac  Sodium (VOLTAREN ) 1 % GEL APPLY 2 GRAMS TO AFFECTED AREA 4 TIMES A DAY Patient not taking: Reported on 11/03/2023 09/28/22   Alba Sharper, MD  dolutegravir   (TIVICAY ) 50 MG tablet Take 1 tablet (50 mg total) by mouth daily. 11/03/23 11/02/24  Luiz Channel, MD  emtricitabine -tenofovir  AF (DESCOVY ) 200-25 MG tablet Take 1 tablet by mouth daily. 11/03/23 11/02/24  Luiz Channel, MD  estradiol  (CLIMARA  - DOSED IN MG/24 HR) 0.1 mg/24hr patch PLACE 1 PATCH (0.1 MG TOTAL) ONTO THE SKIN ONCE A WEEK. 08/06/23   Fredirick Glenys RAMAN, MD  gabapentin  (NEURONTIN ) 100 MG capsule TAKE 1 IN THE MORNING AND AT LUNCH FOR YOUR BACK PAIN. TAKE 3 AT NIGHT BEFORE YOU GO TO SLEEP. 01/31/24   Cleotilde Perkins, DO  hydrocortisone  (ANUSOL -HC) 25 MG suppository UNWRAP AND PLACE 1 SUPPOSITORY RECTALLY EVERY 12 HOURS FOR 12 DAYS. 02/14/24   Alba Sharper, MD  losartan  (COZAAR ) 50 MG tablet TAKE 1 TABLET BY MOUTH EVERY DAY 11/30/23   Alba Sharper, MD  Melatonin 5 MG CAPS Take 1 capsule by mouth at bedtime. Patient not taking: Reported on 11/03/2023    [provider]  mupirocin  ointment (BACTROBAN ) 2 % Apply 1 Application topically 2 (two) times daily. 11/01/23   Paci, Karina M, MD  Omega-3 Fatty Acids (FISH OIL) 300 MG CAPS Take by mouth.    [provider]  rosuvastatin  (CRESTOR ) 10 MG tablet Take 1 tablet (10 mg total) by mouth daily. 02/25/24   Alba Sharper, MD  traMADol  (ULTRAM ) 50 MG tablet Take 1 tablet (50 mg total) by  mouth 3 (three) times daily as needed for moderate pain (pain score 4-6). Please make appointment before next refill. 02/28/24 03/29/24  Alba Sharper, MD    Allergies: Baclofen , Bactrim [sulfamethoxazole-trimethoprim], and Doxycycline     Review of Systems  Updated Vital Signs BP (!) 155/98   Pulse (!) 108   Temp (!) 97.5 F (36.4 C) (Oral)   Resp 19   Ht 5' 6 (1.676 m)   Wt 83.9 kg   SpO2 95%   BMI 29.86 kg/m   Physical Exam Vitals and nursing note reviewed.  Constitutional:      General: She is not in acute distress.    Appearance: She is well-developed. She is not diaphoretic.  HENT:     Head: Normocephalic and  atraumatic.  Eyes:     Pupils: Pupils are equal, round, and reactive to light.  Cardiovascular:     Rate and Rhythm: Normal rate and regular rhythm.     Heart sounds: No murmur heard.    No friction rub. No gallop.  Pulmonary:     Effort: Pulmonary effort is normal.     Breath sounds: Wheezing present. No rales.     Comments: Diffuse wheezes and prolonged expiratory effort Abdominal:     General: There is no distension.     Palpations: Abdomen is soft.     Tenderness: There is no abdominal tenderness.  Musculoskeletal:        General: No tenderness.     Cervical back: Normal range of motion and neck supple.  Skin:    General: Skin is warm and dry.  Neurological:     Mental Status: She is alert and oriented to person, place, and time.  Psychiatric:        Behavior: Behavior normal.     (all labs ordered are listed, but only abnormal results are displayed) Labs Reviewed  COMPREHENSIVE METABOLIC PANEL WITH GFR - Abnormal; Notable for the following components:      Result Value   Glucose, Bld 150 (*)    All other components within normal limits  RESP PANEL BY RT-PCR (RSV, FLU A&B, COVID)  RVPGX2  CBC WITH DIFFERENTIAL/PLATELET  PRO BRAIN NATRIURETIC PEPTIDE  TROPONIN T, HIGH SENSITIVITY    EKG: None  Radiology: DG Chest Port 1 View Result Date: 03/07/2024 EXAM: 1 VIEW(S) XRAY OF THE CHEST 03/07/2024 05:40:00 AM COMPARISON: 12/10/2022 CLINICAL HISTORY: sob FINDINGS: LUNGS AND PLEURA: Hyperinflation. Chronic coarsened interstitial markings without pulmonary edema. No focal pulmonary opacity. No pleural effusion. No pneumothorax. HEART AND MEDIASTINUM: No acute abnormality of the cardiac and mediastinal silhouettes. BONES AND SOFT TISSUES: No acute osseous abnormality. IMPRESSION: 1. Hyperinflation and chronic coarsened interstitial markings without pulmonary edema. Electronically signed by: Waddell Calk MD 03/07/2024 05:53 AM EST RP Workstation: [id number]     .Critical  Care  Performed by: Emil Share, DO Authorized by: Emil Share, DO   Critical care provider statement:    Critical care time (minutes):  35   Critical care time was exclusive of:  Separately billable procedures and treating other patients   Critical care was time spent personally by me on the following activities:  Development of treatment plan with patient or surrogate, discussions with consultants, evaluation of patient's response to treatment, examination of patient, ordering and review of laboratory studies, ordering and review of radiographic studies, ordering and performing treatments and interventions, pulse oximetry, re-evaluation of patient's condition and review of old charts   Care discussed with: admitting provider  Medications Ordered in the ED  albuterol  (PROVENTIL ) (2.5 MG/3ML) 0.083% nebulizer solution 10 mg (10 mg Nebulization New Bag/Given 03/07/24 0530)  acetaminophen  (TYLENOL ) tablet 1,000 mg (1,000 mg Oral Given 03/07/24 0521)  sodium chloride  0.9 % bolus 1,000 mL (0 mLs Intravenous Stopped 03/07/24 0630)  metoCLOPramide  (REGLAN ) injection 5 mg (5 mg Intravenous Given 03/07/24 0521)  diphenhydrAMINE  (BENADRYL ) injection 12.5 mg (12.5 mg Intravenous Given 03/07/24 0521)    Clinical Course as of 03/07/24 0700  Tue Mar 07, 2024  9343 Assumed care from Dr Emil. 63 yo F hx of copd and HIV (last CD4 400) who presented with URI symptoms and SOB since yesterday. Was in distress on EMS arrival. Given meds by EMS and started on continuous nebs here. Is feeling much better. Will attempt walking pulse ox after nebs and if doesn't do well will admit for COPD exacerbation.  [RP]    Clinical Course User Index [RP] Yolande Lamar BROCKS, MD                                 Medical Decision Making Amount and/or Complexity of Data Reviewed Labs: ordered. Radiology: ordered.  Risk OTC drugs. Prescription drug management.   63 yo F with a cc of difficulty breathing.  Going on  since yesterday.  Picked up by EMS with significant tachypnea and wheezes and given 2 DuoNebs Solu-Medrol  magnesium  with significant improvement.  Arrives and still feels short of breath though reportedly has much improved aeration.  Will start on continuous.  Chest x-ray blood work reassess.  Chest x-ray on my independent interpretation without obvious focal infiltrate or pneumothorax.  No acute anemia, troponin negative BNP negative.  Patient reassessed and is feeling a little bit better.  She has probably been 20 minutes left of her continuous therapy.  Plan to ambulate.   Signed out to Dr. Jakie, please see their note for further details of care in the ED.  The patients results and plan were reviewed and discussed.   Any x-rays performed were independently reviewed by myself.   Differential diagnosis were considered with the presenting HPI.  Medications  albuterol  (PROVENTIL ) (2.5 MG/3ML) 0.083% nebulizer solution 10 mg (10 mg Nebulization New Bag/Given 03/07/24 0530)  acetaminophen  (TYLENOL ) tablet 1,000 mg (1,000 mg Oral Given 03/07/24 0521)  sodium chloride  0.9 % bolus 1,000 mL (0 mLs Intravenous Stopped 03/07/24 0630)  metoCLOPramide  (REGLAN ) injection 5 mg (5 mg Intravenous Given 03/07/24 0521)  diphenhydrAMINE  (BENADRYL ) injection 12.5 mg (12.5 mg Intravenous Given 03/07/24 0521)    Vitals:   03/07/24 0459 03/07/24 0504 03/07/24 0531  BP:  (!) 155/98   Pulse:  (!) 112 (!) 108  Resp:  14 19  Temp:  (!) 97.5 F (36.4 C)   TempSrc:  Oral   SpO2:  93% 95%  Weight: 83.9 kg    Height: 5' 6 (1.676 m)      Final diagnoses:  COPD exacerbation (HCC)        Final diagnoses:  COPD exacerbation [facility])    ED Discharge Orders     None          Emil Share, DO 03/07/24 0700  "

## 2024-03-07 NOTE — ED Triage Notes (Signed)
 Pt BIB [facility] from home c/o shortness of breath, cold chills, nausea. Pt has hx of COPD and was 90% RA per EMS upon arrival. Pt presented with diminished lung sound and slight wheeze per EMS upon arrival. Pt received 2 Duonebs, 125 Solumedrol, 2g Magnesium , 4 mg Zofran  en route with EMS. Pt repoirted taken 50 mg Losartan  for high blood pressure PTA.    EMS Vitals 160/100 104 BPM 100% duo neb

## 2024-03-07 NOTE — ED Provider Notes (Signed)
" °  Physical Exam  BP (!) 155/98   Pulse (!) 108   Temp (!) 97.5 F (36.4 C) (Oral)   Resp 19   Ht 5' 6 (1.676 m)   Wt 83.9 kg   SpO2 95%   BMI 29.86 kg/m   Physical Exam  Procedures  Procedures  ED Course / MDM   Clinical Course as of 03/07/24 0759  Tue Mar 07, 2024  9343 Assumed care from Dr Emil. 63 yo F hx of copd and HIV (last CD4 400) who presented with URI symptoms and SOB since yesterday. Was in distress on EMS arrival. Given meds by EMS and started on continuous nebs here. Is feeling much better. Will attempt walking pulse ox after nebs and if doesn't do well will admit for COPD exacerbation.  [RP]  0755 Walking pulse ox obtained and oxygen saturations were 91-94%.  Discussed with the patient.  Have gone over her workup with her.  Performed shared decision making regarding her disposition.  Patient is requesting go home at this point in time.  Feel this is reasonable.  Will discharge her with steroids and azithro.  Says that she already has albuterol  at home and does not need any refills of this.  Will have her follow-up with her primary doctor in several days. [RP]    Clinical Course User Index [RP] Yolande Lamar BROCKS, MD   Medical Decision Making Amount and/or Complexity of Data Reviewed Labs: ordered. Radiology: ordered.  Risk OTC drugs. Prescription drug management.      Yolande Lamar BROCKS, MD 03/07/24 [phone]  "

## 2024-03-07 NOTE — Discharge Instructions (Signed)
You were seen for your COPD exacerbation in the emergency department.   At home, please use your inhalers for any wheezing.  Please take the steroids and azithromycin we have prescribed you for your COPD exacerbation as well.    Check your MyChart online for the results of any tests that had not resulted by the time you left the emergency department.   Follow-up with your primary doctor in 2-3 days regarding your visit.    Return immediately to the emergency department if you experience any of the following: Difficulty breathing, severe chest pain, or any other concerning symptoms.    Thank you for visiting our Emergency Department. It was a pleasure taking care of you today.

## 2024-03-08 ENCOUNTER — Other Ambulatory Visit: Payer: Self-pay

## 2024-03-08 ENCOUNTER — Encounter (HOSPITAL_COMMUNITY): Payer: Self-pay

## 2024-03-08 ENCOUNTER — Emergency Department (HOSPITAL_COMMUNITY)

## 2024-03-08 ENCOUNTER — Observation Stay (HOSPITAL_COMMUNITY)
Admission: EM | Admit: 2024-03-08 | Discharge: 2024-03-12 | Disposition: A | Discharge status: Home / Self Care | Source: Home / Self Care | Attending: Emergency Medicine | Admitting: Emergency Medicine

## 2024-03-08 DIAGNOSIS — Z87891 Personal history of nicotine dependence: Secondary | ICD-10-CM

## 2024-03-08 DIAGNOSIS — R0602 Shortness of breath: Secondary | ICD-10-CM | POA: Diagnosis present

## 2024-03-08 DIAGNOSIS — B20 Human immunodeficiency virus [HIV] disease: Secondary | ICD-10-CM | POA: Diagnosis not present

## 2024-03-08 DIAGNOSIS — M1990 Unspecified osteoarthritis, unspecified site: Secondary | ICD-10-CM | POA: Diagnosis not present

## 2024-03-08 DIAGNOSIS — I10 Essential (primary) hypertension: Secondary | ICD-10-CM | POA: Diagnosis not present

## 2024-03-08 DIAGNOSIS — J9601 Acute respiratory failure with hypoxia: Secondary | ICD-10-CM | POA: Diagnosis not present

## 2024-03-08 DIAGNOSIS — Z79899 Other long term (current) drug therapy: Secondary | ICD-10-CM | POA: Diagnosis not present

## 2024-03-08 DIAGNOSIS — G629 Polyneuropathy, unspecified: Secondary | ICD-10-CM | POA: Diagnosis not present

## 2024-03-08 DIAGNOSIS — E785 Hyperlipidemia, unspecified: Secondary | ICD-10-CM | POA: Diagnosis not present

## 2024-03-08 DIAGNOSIS — Z789 Other specified health status: Secondary | ICD-10-CM

## 2024-03-08 DIAGNOSIS — R7303 Prediabetes: Secondary | ICD-10-CM | POA: Diagnosis present

## 2024-03-08 DIAGNOSIS — Z21 Asymptomatic human immunodeficiency virus [HIV] infection status: Secondary | ICD-10-CM | POA: Diagnosis present

## 2024-03-08 DIAGNOSIS — J4489 Other specified chronic obstructive pulmonary disease: Secondary | ICD-10-CM | POA: Diagnosis present

## 2024-03-08 DIAGNOSIS — Z201 Contact with and (suspected) exposure to tuberculosis: Secondary | ICD-10-CM | POA: Diagnosis present

## 2024-03-08 DIAGNOSIS — J45909 Unspecified asthma, uncomplicated: Secondary | ICD-10-CM | POA: Insufficient documentation

## 2024-03-08 DIAGNOSIS — J441 Chronic obstructive pulmonary disease with (acute) exacerbation: Secondary | ICD-10-CM | POA: Diagnosis not present

## 2024-03-08 HISTORY — DX: Acute respiratory failure with hypoxia: J96.01

## 2024-03-08 LAB — COMPREHENSIVE METABOLIC PANEL WITH GFR
ALT: 18 U/L (ref 0–44)
AST: 46 U/L — ABNORMAL HIGH (ref 15–41)
Albumin: 4.4 g/dL (ref 3.5–5.0)
Alkaline Phosphatase: 59 U/L (ref 38–126)
Anion gap: 12 (ref 5–15)
BUN: 14 mg/dL (ref 8–23)
CO2: 24 mmol/L (ref 22–32)
Calcium: 9.6 mg/dL (ref 8.9–10.3)
Chloride: 99 mmol/L (ref 98–111)
Creatinine, Ser: 0.85 mg/dL (ref 0.44–1.00)
GFR, Estimated: >60 mL/min
Glucose, Bld: 143 mg/dL — ABNORMAL HIGH (ref 70–99)
Potassium: 4.7 mmol/L (ref 3.5–5.1)
Sodium: 135 mmol/L (ref 135–145)
Total Bilirubin: 0.4 mg/dL (ref 0.0–1.2)
Total Protein: 8 g/dL (ref 6.5–8.1)

## 2024-03-08 LAB — RESPIRATORY PANEL BY PCR

## 2024-03-08 LAB — CBC
HCT: 44.3 % (ref 36.0–46.0)
Hemoglobin: 14.2 g/dL (ref 12.0–15.0)
MCH: 29.1 pg (ref 26.0–34.0)
MCHC: 32.1 g/dL (ref 30.0–36.0)
MCV: 90.8 fL (ref 80.0–100.0)
Platelets: 245 K/uL (ref 150–400)
RBC: 4.88 MIL/uL (ref 3.87–5.11)
RDW: 13.1 % (ref 11.5–15.5)
WBC: 11.1 K/uL — ABNORMAL HIGH (ref 4.0–10.5)
nRBC: 0 % (ref 0.0–0.2)

## 2024-03-08 MED ORDER — LACTATED RINGERS IV BOLUS
500.0000 mL | Freq: Once | INTRAVENOUS | Status: AC
  Administered 2024-03-08: 500 mL via INTRAVENOUS

## 2024-03-08 MED ORDER — ADULT MULTIVITAMIN W/MINERALS CH
1.0000 | ORAL_TABLET | Freq: Every day | ORAL | Status: DC
  Administered 2024-03-08 – 2024-03-12 (×5): 1 via ORAL
  Filled 2024-03-08 (×5): qty 1

## 2024-03-08 MED ORDER — IPRATROPIUM-ALBUTEROL 0.5-2.5 (3) MG/3ML IN SOLN
3.0000 mL | RESPIRATORY_TRACT | Status: DC
  Administered 2024-03-08 – 2024-03-11 (×16): 3 mL via RESPIRATORY_TRACT
  Filled 2024-03-08 (×16): qty 3

## 2024-03-08 MED ORDER — DICLOFENAC SODIUM 1 % EX GEL
2.0000 g | Freq: Four times a day (QID) | CUTANEOUS | Status: DC | PRN
  Administered 2024-03-12: 2 g via TOPICAL
  Filled 2024-03-08: qty 100

## 2024-03-08 MED ORDER — IPRATROPIUM-ALBUTEROL 0.5-2.5 (3) MG/3ML IN SOLN
3.0000 mL | Freq: Four times a day (QID) | RESPIRATORY_TRACT | Status: DC
  Administered 2024-03-08: 3 mL via RESPIRATORY_TRACT
  Filled 2024-03-08: qty 3

## 2024-03-08 MED ORDER — AZITHROMYCIN 250 MG PO TABS
250.0000 mg | ORAL_TABLET | Freq: Every day | ORAL | Status: DC
  Administered 2024-03-09: 250 mg via ORAL
  Filled 2024-03-08: qty 1

## 2024-03-08 MED ORDER — BUDESON-GLYCOPYRROL-FORMOTEROL 160-9-4.8 MCG/ACT IN AERO
2.0000 | INHALATION_SPRAY | Freq: Two times a day (BID) | RESPIRATORY_TRACT | Status: DC
  Administered 2024-03-09 (×2): 2 via RESPIRATORY_TRACT
  Filled 2024-03-08 (×2): qty 5.9

## 2024-03-08 MED ORDER — DOLUTEGRAVIR SODIUM 50 MG PO TABS
50.0000 mg | ORAL_TABLET | Freq: Every day | ORAL | Status: DC
  Administered 2024-03-08 – 2024-03-12 (×5): 50 mg via ORAL
  Filled 2024-03-08 (×5): qty 1

## 2024-03-08 MED ORDER — HYDROXYZINE HCL 25 MG PO TABS
25.0000 mg | ORAL_TABLET | Freq: Three times a day (TID) | ORAL | Status: DC | PRN
  Administered 2024-03-11 – 2024-03-12 (×2): 25 mg via ORAL
  Filled 2024-03-08 (×2): qty 1

## 2024-03-08 MED ORDER — BENZONATATE 100 MG PO CAPS
100.0000 mg | ORAL_CAPSULE | Freq: Three times a day (TID) | ORAL | Status: DC | PRN
  Administered 2024-03-08 – 2024-03-12 (×9): 100 mg via ORAL
  Filled 2024-03-08 (×9): qty 1

## 2024-03-08 MED ORDER — GABAPENTIN 300 MG PO CAPS
300.0000 mg | ORAL_CAPSULE | Freq: Every day | ORAL | Status: DC
  Administered 2024-03-08 – 2024-03-11 (×4): 300 mg via ORAL
  Filled 2024-03-08 (×4): qty 1

## 2024-03-08 MED ORDER — EMTRICITABINE-TENOFOVIR AF 200-25 MG PO TABS
1.0000 | ORAL_TABLET | Freq: Every day | ORAL | Status: DC
  Administered 2024-03-08 – 2024-03-12 (×5): 1 via ORAL
  Filled 2024-03-08 (×5): qty 1

## 2024-03-08 MED ORDER — MELATONIN 3 MG PO TABS
3.0000 mg | ORAL_TABLET | Freq: Every evening | ORAL | Status: DC | PRN
  Filled 2024-03-08: qty 1

## 2024-03-08 MED ORDER — ALBUTEROL SULFATE (2.5 MG/3ML) 0.083% IN NEBU
10.0000 mg/h | INHALATION_SOLUTION | Freq: Once | RESPIRATORY_TRACT | Status: AC
  Administered 2024-03-08: 10 mg/h via RESPIRATORY_TRACT
  Filled 2024-03-08: qty 12

## 2024-03-08 MED ORDER — METHYLPREDNISOLONE SODIUM SUCC 40 MG IJ SOLR
40.0000 mg | Freq: Two times a day (BID) | INTRAMUSCULAR | Status: AC
  Administered 2024-03-08 – 2024-03-09 (×4): 40 mg via INTRAVENOUS
  Filled 2024-03-08 (×4): qty 1

## 2024-03-08 MED ORDER — ACETAMINOPHEN 325 MG PO TABS
650.0000 mg | ORAL_TABLET | ORAL | Status: AC
  Administered 2024-03-08: 650 mg via ORAL
  Filled 2024-03-08: qty 2

## 2024-03-08 MED ORDER — ENOXAPARIN SODIUM 40 MG/0.4ML IJ SOSY
40.0000 mg | PREFILLED_SYRINGE | INTRAMUSCULAR | Status: DC
  Administered 2024-03-08 – 2024-03-12 (×5): 40 mg via SUBCUTANEOUS
  Filled 2024-03-08 (×5): qty 0.4

## 2024-03-08 MED ORDER — KETOROLAC TROMETHAMINE 15 MG/ML IJ SOLN
15.0000 mg | Freq: Once | INTRAMUSCULAR | Status: AC
  Administered 2024-03-08: 15 mg via INTRAVENOUS
  Filled 2024-03-08: qty 1

## 2024-03-08 MED ORDER — ROSUVASTATIN CALCIUM 5 MG PO TABS
10.0000 mg | ORAL_TABLET | Freq: Every day | ORAL | Status: DC
  Administered 2024-03-08 – 2024-03-12 (×5): 10 mg via ORAL
  Filled 2024-03-08 (×5): qty 2

## 2024-03-08 MED ORDER — AZITHROMYCIN 250 MG PO TABS
250.0000 mg | ORAL_TABLET | Freq: Every day | ORAL | Status: DC
  Administered 2024-03-08: 250 mg via ORAL
  Filled 2024-03-08: qty 1

## 2024-03-08 MED ORDER — LOSARTAN POTASSIUM 50 MG PO TABS
50.0000 mg | ORAL_TABLET | Freq: Every day | ORAL | Status: DC
  Administered 2024-03-08 – 2024-03-12 (×5): 50 mg via ORAL
  Filled 2024-03-08 (×5): qty 1

## 2024-03-08 MED ORDER — HYDROXYZINE HCL 25 MG PO TABS
50.0000 mg | ORAL_TABLET | Freq: Three times a day (TID) | ORAL | Status: DC | PRN
  Administered 2024-03-08: 50 mg via ORAL
  Filled 2024-03-08: qty 2

## 2024-03-08 MED ORDER — ACETAMINOPHEN 325 MG PO TABS
650.0000 mg | ORAL_TABLET | Freq: Four times a day (QID) | ORAL | Status: DC | PRN
  Administered 2024-03-08 – 2024-03-09 (×3): 650 mg via ORAL
  Filled 2024-03-08 (×3): qty 2

## 2024-03-08 MED ORDER — ESTRADIOL 0.1 MG/24HR TD PTWK: 0.1000 mg | MEDICATED_PATCH | TRANSDERMAL | Status: DC

## 2024-03-08 NOTE — Discharge Instructions (Addendum)
 Dear Marge Babineau,  Thank you for letting us  participate in your care. You were hospitalized for shortness of breath and diagnosed with COPD with acute exacerbation (HCC). You were treated with antibiotics, breathing treatments, steroids and oxygen as needed.  Please complete your full course of steroids and antibiotics as prescribed.  Please follow-up with your PCP within the next week.  POST-HOSPITAL & CARE INSTRUCTIONS Go to your follow up appointments (listed below)   DOCTOR'S APPOINTMENT   Future Appointments  Date Time Provider Department Center  03/17/2024  2:10 PM Alba Sharper, MD [facility]  03/28/2024  2:40 PM [facility] DIAG TOMO 1 [facility]AST CE  08/31/2024  2:15 PM Paci, Rufus HERO, MD [facility] None  11/16/2024 10:30 AM [facility] ANNUAL WELLNESS VISIT [facility]    Take care and be well!  Family Medicine Teaching Service Inpatient Team [place]  [facility]  [address] [place], [place] [address] [phone]

## 2024-03-08 NOTE — H&P (Signed)
 "    Hospital Admission History and Physical Service Pager: [phone]  Patient name: Siera Lutze Medical record number: [id number] Date of Birth: June 23, 1960 Age: 63 y.o. Gender: female  Primary Care Provider: Alba Sharper, MD Consultants: none Code Status: Full Code  Preferred Emergency Contact:  Contact Information     Name Relation Home Work Mobile   [place] Spouse [phone]  [phone]   Estelle Zita Blades [phone]  [phone]   Richardson,Vernel Father [phone]  [phone]      Other Contacts   None on File      Chief Complaint: SOB  Differential and Medical Decision Making:  Sharryn Obarr is a 63 y.o. female presenting with three days with dyspnea and cough along with general fatigue .  Differential for this patient's presentation of this includes COPD exacerbation, less likely PE or reactivation of TB. Pertinent PMHx includes HIV, HTN, hx of TB which was treated.   Favor COPD exacerbation as patient has dyspnea and hypoxia which seemed to initially respond to beta-agonists. Chest xray does not show focal process, so do not feel bacterial pneumonia is contributing, though with her white count and history of HIV, feel it is appropriate to cover for infection. Could consider PE though patient does not have history of arrhythmias and no clinical evidence pointing to DVT. Patient has remote history of TB which was treated, could consider reactivation, though chest xray does not reflect this.  Assessment & Plan COPD with acute exacerbation (HCC) HIV (human immunodeficiency virus infection) (HCC) Suspect symptoms are 2/2 to viral process, though quad screen was negative for influenza and RSV. Patients last CD4 count on 11/03/23 was 397.  - Admit to inpatient, attending: Dr. Rosalynn  - s/p solumederol yesterday, will start on solumedrol q12h, duonebs q4h, continue home breztri  inhaler  - supplemental O2, titrate to O2 goal of 88% - 92% - continue  azithromycin  for 3 more doses (12/30-1/3) - continous pulse ox - RPP ordered, droplet precautions pending  - consider coverage of PJP coverage if patient is not clinically improving  - continue home descovy  and tivicay   - AM CBC, BMP   Essential hypertension BP elevated during this admission (140s/100s) and patient with headache. She did miss dose of medications yesterday. - restart home cozaar  50 mg  - tylenol  prn for headaches - CTM  Chronic health problem Neuropathy: continue gabapentin  100 mg with breakfast and lunch and 300 at bedtime,  Arthritis: continue voltaren  gel, tylenol  prn for pain HLD: continue crestor  10 mg   FEN/GI: Regular VTE Prophylaxis: Lovenox   Disposition: med surg   History of Present Illness:  Willowdean Sundeen is a 63 y.o. female presenting with shortness of breath and wheezing. She originally presented to the ED yesterday having symptoms of SOB for two days but opted to go home after treatment with duonebs, solumedrol and magnesium . Came back today after feeling worse at home. Describes difficulty with any ambulation and feeling she cannot catch her breath. Denies any chest pain. She tried albuterol  nebulizer at home after leaving ED but that did not help. Decided to return as the SOB was not better. Reports she last took her medication yesterday, missed her BP medication. She describes also feeling generally tired and like she's catching a cold. Reports some post tussive emesis but no nausea.    In the ED, she was initially tachycardic, maintaining oxygen at 92% but wheezy so was treated with solumederol and magnesium . She had one desat to 81% so was put on 2L [place].  She remained tachypneic and tachycardic with elevated Bps. Chest xray showed hyperinflation and chronic coarsened interstitial markings. Labs remarkable for mildly evaluated WBC at 11.1.    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   Pertinent Past Surgical History: Appendectomy Cholecystectomy Hysterectomy with bilateral oophorectomy   Remainder reviewed in history tab.  Pertinent Social History: Tobacco use: Former Alcohol use: No Other Substance use: no Lives with husband   Pertinent Family History: Father COPD    Important Outpatient Medications: Albuterol  prn Breztri  2 puffs BID Descovy  Gabapentin  Cozaar  Crestor  Tivicay  Tramadol   Vit D   Objective: BP (!) 145/100   Pulse (!) 119   Temp 97.9 F (36.6 C) (Oral)   Resp (!) 21   Ht 5' 6 (1.676 m)   Wt 83.9 kg   SpO2 (!) 81%   BMI 29.85 kg/m  Exam: General: appears stated age, slightly uncomfortable on exam  HEENT: atraumatic, normocephalic, MMM Cardiovascular: tachycardic, no m/r/g Respiratory: inspiratory and expiratory audible wheezing with decreased breath sounds globally, intermittently tachypneic to the 30s with movement, sating 98% on 2L Gastrointestinal: soft NTND MSK: no peripheral edema noted  Neuro: no gross neurological deficits   Labs:  CBC BMET  Recent Labs  Lab 03/08/24 0612  WBC 11.1*  HGB 14.2  HCT 44.3  PLT 245   Recent Labs  Lab 03/08/24 0612  NA 135  K 4.7  CL 99  CO2 24  BUN 14  CREATININE 0.85  GLUCOSE 143*  CALCIUM  9.6     EKG: My own interpretation (not copied from electronic read)    Sinus tachycardia ~110 BPM  Imaging Studies Performed: CXR 12/31: IMPRESSION: 1. Hyperinflation and chronic coarsened interstitial markings. 2. No acute findings.   Lonnie Zaire Vanbuskirk, MD 03/08/2024, 9:55 AM PGY-2, [facility]  FPTS Intern pager: [phone], text pages welcome Secure chat group [facility] Teaching Service     "

## 2024-03-08 NOTE — Hospital Course (Addendum)
 Courtney Hamilton is a 63 y.o.female with a history of COPD, HIV, hypertension who was admitted to the family medicine teaching Service at [facility] for COPD exacerbation. Her hospital course is detailed below:  COPD excerbation Parainfluenza Virus  Patient presented to the ED on 12/31 with increased work of breathing and shortness of breath.  Patient reported increased white phlegm production and no relief at home with home nebulizers.  She was initially treated in the ED with magnesium , Solu-Medrol , DuoNebs and discharged on azithromycin .  She returned to the ED on 12/31 after her symptoms were not improving.  On arrival patient was tachypneic but satting well on room air.  However she did have a desaturation to 81% and was put on 2 L nasal cannula.  Patient was treated with another dose of Solu-Medrol  in the ED and started on DuoNebs.  She was transition to oral steroids on 1/2 for a full course of steroids.  Azithromycin  was started on 12/30 to end on 1/3.  RPP was ordered which was positive for parainfluenza. Antibiotic coverage as follows: Azithromycin  (12/30 - 12/31) transitioned to IV CTX (12/31 - 1/2), and finally to PO cefuroxime  (1/3 - 1/8). Patient was able to ambulate with a pulse ox of 97% at rest and 95% when ambulating prior to discharge. Patient did have leukocytosis at discharge though otherwise clinically well thought to be due to steroids.   Given history of HIV and remote history of TB infection, considered treatment for PJP.  However her most recent CD4 count was approximately 400, so did not order PJP prophylaxis.  Repeat CD4 count in hospital showed it was low and HIV RNA quant was undetectable. Based on those results patient was continued on the same therapy per recommendation of infectious disease team.   Hypertension Patient initially presented to the ED with elevated BPs (140s/100s) and headache.  Patient did miss medication prior to arrival.  After restarting home Cozaar  50 mg and  controlling respiratory symptoms, hypertension and headache improved.  Patient was normotensive prior to discharge.   Other chronic conditions were medically managed with home medications and formulary alternatives as necessary (neuropathy, arthritis, HLD)  PCP Follow-up Recommendations: Consider adjusting BP medications if elevated outpatient

## 2024-03-08 NOTE — Assessment & Plan Note (Signed)
 Neuropathy: continue gabapentin  100 mg with breakfast and lunch and 300 at bedtime,  Arthritis: continue voltaren  gel, tylenol  prn for pain HLD: continue crestor  10 mg

## 2024-03-08 NOTE — Assessment & Plan Note (Signed)
 BP elevated during this admission (140s/100s) and patient with headache. She did miss dose of medications yesterday. - restart home cozaar  50 mg  - tylenol  prn for headaches - CTM

## 2024-03-08 NOTE — Assessment & Plan Note (Signed)
 Suspect symptoms are 2/2 to viral process, though quad screen was negative for influenza and RSV. Patients last CD4 count on 11/03/23 was 397.  - Admit to inpatient, attending: Dr. Rosalynn  - s/p solumederol yesterday, will start on solumedrol q12h, duonebs q4h, continue home breztri  inhaler  - supplemental O2, titrate to O2 goal of 88% - 92% - continue azithromycin  for 3 more doses (12/30-1/3) - continous pulse ox - RPP ordered, droplet precautions pending  - consider coverage of PJP coverage if patient is not clinically improving  - continue home descovy  and tivicay   - AM CBC, BMP

## 2024-03-08 NOTE — ED Provider Notes (Cosign Needed Addendum)
 " Received signout from previous provider, see his note for complete H&P.  63 year old female with history of COPD presenting complaining of increased shortness of breath and wheezing ongoing for the past 2 to 3 days.  Patient was seen the day before for her symptoms, was found to be in COPD exacerbation and received treatment.  She was offered admission but prefers to go home however his symptoms progressed thus prompting this ER visit.  She did receive Solu-Medrol , magnesium  as well as DuoNeb via EMS.  She received additional continuous hour-long neb in the ED with some improvement of symptoms.  I appreciate consultation from family practice resident who agrees to admit patient for COPD exacerbation.  Patient agrees with plan.  8:45 AM Family medicine resident agrees to admit patient.  After patient finished with her continuous nebs, her O2 sats dropped down to 81%.  She is actively wheezing.  Patient placed on supplemental oxygen.  Will continue to monitor and provide management.  .Critical Care  Performed by: Nivia Colon, PA-C Authorized by: Nivia Colon, PA-C   Critical care provider statement:    Critical care time (minutes):  32   Critical care was time spent personally by me on the following activities:  Development of treatment plan with patient or surrogate, discussions with consultants, evaluation of patient's response to treatment, examination of patient, ordering and review of laboratory studies, ordering and review of radiographic studies, ordering and performing treatments and interventions, pulse oximetry, re-evaluation of patient's condition and review of old charts    BP (!) 170/123 (BP Location: Right Arm)   Pulse (!) 105   Temp 97.9 F (36.6 C) (Oral)   Resp (!) 24   Ht 5' 6 (1.676 m)   Wt 83.9 kg   SpO2 96%   BMI 29.85 kg/m   Results for orders placed or performed during the hospital encounter of 03/08/24  CBC   Collection Time: 03/08/24  6:12 AM  Result Value Ref  Range   WBC 11.1 (H) 4.0 - 10.5 K/uL   RBC 4.88 3.87 - 5.11 MIL/uL   Hemoglobin 14.2 12.0 - 15.0 g/dL   HCT 55.6 63.9 - 53.9 %   MCV 90.8 80.0 - 100.0 fL   MCH 29.1 26.0 - 34.0 pg   MCHC 32.1 30.0 - 36.0 g/dL   RDW 86.8 88.4 - 84.4 %   Platelets 245 150 - 400 K/uL   nRBC 0.0 0.0 - 0.2 %  Comprehensive metabolic panel   Collection Time: 03/08/24  6:12 AM  Result Value Ref Range   Sodium 135 135 - 145 mmol/L   Potassium 4.7 3.5 - 5.1 mmol/L   Chloride 99 98 - 111 mmol/L   CO2 24 22 - 32 mmol/L   Glucose, Bld 143 (H) 70 - 99 mg/dL   BUN 14 8 - 23 mg/dL   Creatinine, Ser 9.14 0.44 - 1.00 mg/dL   Calcium  9.6 8.9 - 10.3 mg/dL   Total Protein 8.0 6.5 - 8.1 g/dL   Albumin 4.4 3.5 - 5.0 g/dL   AST 46 (H) 15 - 41 U/L   ALT 18 0 - 44 U/L   Alkaline Phosphatase 59 38 - 126 U/L   Total Bilirubin 0.4 0.0 - 1.2 mg/dL   GFR, Estimated >39 >39 mL/min   Anion gap 12 5 - 15   *Note: Due to a large number of results and/or encounters for the requested time period, some results have not been displayed. A complete set of results can  be found in Results Review.   DG Chest Portable 1 View Result Date: 03/08/2024 EXAM: 1 VIEW XRAY OF THE CHEST 03/08/2024 06:21:00 AM COMPARISON: 03/07/2024 CLINICAL HISTORY: Shortness of breath FINDINGS: LUNGS AND PLEURA: Hyperinflation and chronic coarsened interstitial markings. No pleural effusion. No pneumothorax. HEART AND MEDIASTINUM: Aortic calcification. No acute abnormality of the cardiac and mediastinal silhouettes. BONES AND SOFT TISSUES: No acute osseous abnormality. IMPRESSION: 1. Hyperinflation and chronic coarsened interstitial markings. 2. No acute findings. Electronically signed by: Waddell Calk MD 03/08/2024 06:26 AM EST RP Workstation: [id number]   DG Chest Port 1 View Result Date: 03/07/2024 EXAM: 1 VIEW(S) XRAY OF THE CHEST 03/07/2024 05:40:00 AM COMPARISON: 12/10/2022 CLINICAL HISTORY: sob FINDINGS: LUNGS AND PLEURA: Hyperinflation. Chronic  coarsened interstitial markings without pulmonary edema. No focal pulmonary opacity. No pleural effusion. No pneumothorax. HEART AND MEDIASTINUM: No acute abnormality of the cardiac and mediastinal silhouettes. BONES AND SOFT TISSUES: No acute osseous abnormality. IMPRESSION: 1. Hyperinflation and chronic coarsened interstitial markings without pulmonary edema. Electronically signed by: Waddell Calk MD 03/07/2024 05:53 AM EST RP Workstation: [id number]      Nivia Colon, PA-C 03/08/24 0827    Nivia Colon, PA-C 03/08/24 [phone]  "

## 2024-03-08 NOTE — ED Notes (Signed)
 Breathing is even and unlabored. Call light within reach, pt encouraged to use when needs arise.  Kitchen notified pt does not eat pork or beef.  Support person at bedside. PT on 2L nasal cannula.

## 2024-03-08 NOTE — Plan of Care (Signed)
 FMTS Interim Progress Note  S: Feeling okay. Having headache, no other pain. Still coughing, not much coming up. Breathing a bit better than when first arrived. Not feeling well enough to eat and drink much, had about a cup of water  so far today. No nausea.   O: BP (!) 141/85   Pulse 90   Temp 98.5 F (36.9 C) (Oral)   Resp 13   Ht 5' 6 (1.676 m)   Wt 83.9 kg   SpO2 96%   BMI 29.85 kg/m    General: No acute distress.  CV: S1/S2. No extra heart sounds. Warm and well-perfused. Pulm: Nonlabored breathing on 2L O2. CTAB. No increased WOB. Abd: Soft, non-tender, non-distended. Skin:  Warm, dry. Psych: Pleasant and appropriate.    A/P: COPD exacerbation Parainfluenza Satting well on 2L O2. Intermittently mildly tachycardic, suspect secondary to inflammatory state vs dehydration in setting of reduced PO intake.  - Cont Azithromycin  (12/30-1/3)  - Cont Solumedrol q12h - Cont Duonebs q4 - Cont Breztri  2p BID - 500 LR bolus ordered for rehydration - Tessalon  capsule 100 TID PRN added - Cont Tylenol  650 q6 PRN - Wean O2 as tolerated, goal 88-92% - AM CBC, BMP  HIV - Cont home descovy  and tivicay  - Consider PJP coverage if patient not clinically improving   HTN Improved this evening.  - Cont home losartan  50 daily    Remainder of care plan as indicated in FMTS notes.    Diona Perkins, MD 03/08/2024, 7:45 PM PGY-2, [facility] Family Medicine Service pager [phone]

## 2024-03-08 NOTE — ED Triage Notes (Signed)
 Pt BIB [facility] from home d/t Sob.  Pt was here yesterday and was diagnosed with Bronchitis.  They wanted to admit her but she did not stay.  EMS gave 125 mg IV Solumedrol 2 duonebs and 2g of Magnesium .    BP 168/118 HR 114 100% on Neb  92% O2 on Neb CBG 132  18g RAC

## 2024-03-08 NOTE — ED Provider Notes (Signed)
 " [place] EMERGENCY DEPARTMENT AT [facility] Provider Note   CSN: [id number] Arrival date & time: 03/08/24  9444     Patient presents with: Shortness of Breath   Courtney Hamilton is a 63 y.o. female with PMH HIV, chronic low back pain, peripheral neuropathy, hematochezia, COPD.  Presents to ED complaining of shortness of breath.  Reports that she was seen here yesterday with complaint of shortness of breath.  Patient was found to be having COPD exacerbation, was offered admission however patient deferred on admission and left ED with steroids, albuterol  inhaler.  Patient reports that upon arriving home, she has become progressively more short of breath throughout the course of the day.  She denies any chest pain or fevers at home.  Denies any nausea, vomiting or abdominal pain.  Denies leg swelling.  Endorsing lightheadedness and dizziness on standing.  Reports that she has tried taking steroids at home.  EMS provided patient with 125 Solu-Medrol , magnesium , 2 DuoNebs.  On arrival, the patient oxygen saturation is 92% room air.  She continues to wheeze.  She reports that she is short of breath but denies any chest pain.  HPI     Prior to Admission medications  Medication Sig Start Date End Date Taking? Authorizing Provider  albuterol  (VENTOLIN  HFA) 108 (90 Base) MCG/ACT inhaler TAKE 2 PUFFS BY MOUTH EVERY 6 HOURS AS NEEDED FOR WHEEZE OR SHORTNESS OF BREATH 05/18/23   Alba Sharper, MD  Ascorbic Acid (VITAMIN C) 1000 MG tablet Take 1,000 mg by mouth at bedtime.    [provider]  azithromycin  (ZITHROMAX ) 250 MG tablet Take 1 tablet (250 mg total) by mouth daily. Take first 2 tablets together, then 1 every day until finished. 03/07/24   Yolande Lamar BROCKS, MD  Black Cohosh-Soy-Ginkgo-Magnol (ESTROVEN MOOD & MEMORY PO) Take 1 tablet by mouth daily.    [provider]  BREZTRI  AEROSPHERE 160-9-4.8 MCG/ACT AERO INHALE 2 PUFFS INTO THE LUNGS 2 TIMES DAILY. RINSE MOUTH  AFTER USE TO AVOID THRUSH. 05/18/23   Alba Sharper, MD  celecoxib  (CELEBREX ) 100 MG capsule Take 1 capsule (100 mg total) by mouth 2 (two) times daily as needed for mild pain (pain score 1-3) or moderate pain (pain score 4-6). 11/10/23   Dixon, Phillip E, MD  Cholecalciferol (VITAMIN D3) 25 MCG (1000 UT) capsule Take 1,000 Units by mouth daily.    [provider]  COLLAGEN PO Take 1 Scoop by mouth daily. Powder mixed into smoothie    [provider]  diclofenac  Sodium (VOLTAREN ) 1 % GEL APPLY 2 GRAMS TO AFFECTED AREA 4 TIMES A DAY Patient not taking: Reported on 11/03/2023 09/28/22   Alba Sharper, MD  dolutegravir  (TIVICAY ) 50 MG tablet Take 1 tablet (50 mg total) by mouth daily. 11/03/23 11/02/24  Luiz Channel, MD  emtricitabine -tenofovir  AF (DESCOVY ) 200-25 MG tablet Take 1 tablet by mouth daily. 11/03/23 11/02/24  Luiz Channel, MD  estradiol  (CLIMARA  - DOSED IN MG/24 HR) 0.1 mg/24hr patch PLACE 1 PATCH (0.1 MG TOTAL) ONTO THE SKIN ONCE A WEEK. 08/06/23   Fredirick Glenys RAMAN, MD  gabapentin  (NEURONTIN ) 100 MG capsule TAKE 1 IN THE MORNING AND AT LUNCH FOR YOUR BACK PAIN. TAKE 3 AT NIGHT BEFORE YOU GO TO SLEEP. 03/07/24   Alba Sharper, MD  hydrocortisone  (ANUSOL -HC) 25 MG suppository UNWRAP AND PLACE 1 SUPPOSITORY RECTALLY EVERY 12 HOURS FOR 12 DAYS. 02/14/24   Alba Sharper, MD  losartan  (COZAAR ) 50 MG tablet TAKE 1 TABLET BY MOUTH EVERY  DAY 11/30/23   Alba Sharper, MD  Melatonin 5 MG CAPS Take 1 capsule by mouth at bedtime. Patient not taking: Reported on 11/03/2023    [provider]  mupirocin  ointment (BACTROBAN ) 2 % Apply 1 Application topically 2 (two) times daily. 11/01/23   Paci, Karina M, MD  Omega-3 Fatty Acids (FISH OIL) 300 MG CAPS Take by mouth.    [provider]  predniSONE  (DELTASONE ) 20 MG tablet Take 2 tablets (40 mg total) by mouth daily for 5 days. 03/07/24 03/12/24  Yolande Lamar BROCKS, MD  rosuvastatin  (CRESTOR ) 10 MG tablet Take 1  tablet (10 mg total) by mouth daily. 02/25/24   Alba Sharper, MD  traMADol  (ULTRAM ) 50 MG tablet Take 1 tablet (50 mg total) by mouth 3 (three) times daily as needed for moderate pain (pain score 4-6). Please make appointment before next refill. 02/28/24 03/29/24  Alba Sharper, MD    Allergies: Baclofen , Bactrim [sulfamethoxazole-trimethoprim], and Doxycycline     Review of Systems  All other systems reviewed and are negative.   Updated Vital Signs BP (!) 170/123 (BP Location: Right Arm)   Pulse (!) 105   Temp 97.9 F (36.6 C) (Oral)   Resp (!) 24   Ht 5' 6 (1.676 m)   Wt 83.9 kg   SpO2 93%   BMI 29.85 kg/m   Physical Exam Vitals and nursing note reviewed.  Constitutional:      General: She is not in acute distress.    Appearance: She is well-developed.  HENT:     Head: Normocephalic and atraumatic.  Eyes:     Conjunctiva/sclera: Conjunctivae normal.  Cardiovascular:     Rate and Rhythm: Normal rate and regular rhythm.     Heart sounds: No murmur heard. Pulmonary:     Effort: Pulmonary effort is normal. No respiratory distress.     Breath sounds: Wheezing present.  Abdominal:     Palpations: Abdomen is soft.     Tenderness: There is no abdominal tenderness.  Musculoskeletal:        General: No swelling.     Cervical back: Neck supple.  Skin:    General: Skin is warm and dry.     Capillary Refill: Capillary refill takes less than 2 seconds.  Neurological:     Mental Status: She is alert.  Psychiatric:        Mood and Affect: Mood normal.     (all labs ordered are listed, but only abnormal results are displayed) Labs Reviewed  CBC  COMPREHENSIVE METABOLIC PANEL WITH GFR    EKG: None  Radiology: DG Chest Portable 1 View Result Date: 03/08/2024 EXAM: 1 VIEW XRAY OF THE CHEST 03/08/2024 06:21:00 AM COMPARISON: 03/07/2024 CLINICAL HISTORY: Shortness of breath FINDINGS: LUNGS AND PLEURA: Hyperinflation and chronic coarsened interstitial markings. No  pleural effusion. No pneumothorax. HEART AND MEDIASTINUM: Aortic calcification. No acute abnormality of the cardiac and mediastinal silhouettes. BONES AND SOFT TISSUES: No acute osseous abnormality. IMPRESSION: 1. Hyperinflation and chronic coarsened interstitial markings. 2. No acute findings. Electronically signed by: Waddell Calk MD 03/08/2024 06:26 AM EST RP Workstation: [id number]   DG Chest Port 1 View Result Date: 03/07/2024 EXAM: 1 VIEW(S) XRAY OF THE CHEST 03/07/2024 05:40:00 AM COMPARISON: 12/10/2022 CLINICAL HISTORY: sob FINDINGS: LUNGS AND PLEURA: Hyperinflation. Chronic coarsened interstitial markings without pulmonary edema. No focal pulmonary opacity. No pleural effusion. No pneumothorax. HEART AND MEDIASTINUM: No acute abnormality of the cardiac and mediastinal silhouettes. BONES AND SOFT TISSUES: No acute osseous abnormality. IMPRESSION: 1. Hyperinflation  and chronic coarsened interstitial markings without pulmonary edema. Electronically signed by: Waddell Calk MD 03/07/2024 05:53 AM EST RP Workstation: [id number]    .Critical Care  Performed by: Ruthell Lonni FALCON, PA-C Authorized by: Ruthell Lonni FALCON, PA-C   Critical care provider statement:    Critical care time (minutes):  25   Critical care was necessary to treat or prevent imminent or life-threatening deterioration of the following conditions:  Respiratory failure   Critical care was time spent personally by me on the following activities:  Blood draw for specimens, development of treatment plan with patient or surrogate, discussions with consultants, evaluation of patient's response to treatment, discussions with primary provider, examination of patient, interpretation of cardiac output measurements, obtaining history from patient or surrogate, ordering and performing treatments and interventions, ordering and review of laboratory studies, ordering and review of radiographic studies, pulse oximetry, re-evaluation of  patient's condition and review of old charts   I assumed direction of critical care for this patient from another provider in my specialty: no     Care discussed with: admitting provider      Medications Ordered in the ED  albuterol  (PROVENTIL ) (2.5 MG/3ML) 0.083% nebulizer solution (10 mg/hr Nebulization Given 03/08/24 0622)  acetaminophen  (TYLENOL ) tablet 650 mg (650 mg Oral Given 03/08/24 0617)    Medical Decision Making Amount and/or Complexity of Data Reviewed Labs: ordered. Radiology: ordered. ECG/medicine tests: ordered.   63 year old female presents ED for evaluation of shortness of breath.  On exam, HD stable.  Lung sounds have wheezing throughout.  Abdomen soft and compressible.  Neurological examination at baseline.  She is afebrile however tachycardic to 105.  Suspect COPD exacerbation for this patient.  She was seen yesterday and diagnosed with the same.  States that she was feeling fine upon discharge but throughout the course the day has felt progressively more short of breath causing her to be unable to sleep this evening.  Will collect basic labs, chest x-ray, EKG.  Patient provided with 10 mg continuous albuterol  nebulized solution at this time.  Patient received Solu-Medrol , magnesium  with EMS.  Patient agreeable to being admitted at this time.  Will sign patient out to oncoming provider Nivia RIGGERS pending workup.  Plan of management discussed.   Final diagnoses:  COPD exacerbation [facility])    ED Discharge Orders     None          Ruthell Lonni FALCON RIGGERS 03/08/24 9365    Lorette Mayo, MD 03/08/24 [phone]  "

## 2024-03-09 ENCOUNTER — Encounter (HOSPITAL_COMMUNITY): Payer: Self-pay | Admitting: Family Medicine

## 2024-03-09 DIAGNOSIS — J441 Chronic obstructive pulmonary disease with (acute) exacerbation: Secondary | ICD-10-CM | POA: Diagnosis not present

## 2024-03-09 LAB — CBC
HCT: 40.5 % (ref 36.0–46.0)
Hemoglobin: 13 g/dL (ref 12.0–15.0)
MCH: 29.3 pg (ref 26.0–34.0)
MCHC: 32.1 g/dL (ref 30.0–36.0)
MCV: 91.4 fL (ref 80.0–100.0)
Platelets: 249 K/uL (ref 150–400)
RBC: 4.43 MIL/uL (ref 3.87–5.11)
RDW: 13.1 % (ref 11.5–15.5)
WBC: 11.4 K/uL — ABNORMAL HIGH (ref 4.0–10.5)
nRBC: 0 % (ref 0.0–0.2)

## 2024-03-09 LAB — BASIC METABOLIC PANEL WITH GFR
Anion gap: 11 (ref 5–15)
BUN: 24 mg/dL — ABNORMAL HIGH (ref 8–23)
CO2: 24 mmol/L (ref 22–32)
Calcium: 10 mg/dL (ref 8.9–10.3)
Chloride: 103 mmol/L (ref 98–111)
Creatinine, Ser: 1 mg/dL (ref 0.44–1.00)
GFR, Estimated: >60 mL/min
Glucose, Bld: 143 mg/dL — ABNORMAL HIGH (ref 70–99)
Potassium: 4.3 mmol/L (ref 3.5–5.1)
Sodium: 138 mmol/L (ref 135–145)

## 2024-03-09 MED ORDER — ACETAMINOPHEN 500 MG PO TABS
1000.0000 mg | ORAL_TABLET | Freq: Four times a day (QID) | ORAL | Status: DC | PRN
  Administered 2024-03-10 – 2024-03-12 (×2): 1000 mg via ORAL
  Filled 2024-03-09 (×2): qty 2

## 2024-03-09 MED ORDER — SODIUM CHLORIDE 0.9 % IV SOLN
1.0000 g | INTRAVENOUS | Status: DC
  Administered 2024-03-09 – 2024-03-10 (×2): 1 g via INTRAVENOUS
  Filled 2024-03-09 (×2): qty 10

## 2024-03-09 MED ORDER — SALINE SPRAY 0.65 % NA SOLN
1.0000 | Freq: Once | NASAL | Status: AC
  Administered 2024-03-09: 1 via NASAL
  Filled 2024-03-09: qty 44

## 2024-03-09 MED ORDER — TRAMADOL HCL 50 MG PO TABS
50.0000 mg | ORAL_TABLET | Freq: Three times a day (TID) | ORAL | Status: DC | PRN
  Administered 2024-03-09 – 2024-03-12 (×8): 50 mg via ORAL
  Filled 2024-03-09 (×8): qty 1

## 2024-03-09 MED ORDER — BUDESON-GLYCOPYRROL-FORMOTEROL 160-9-4.8 MCG/ACT IN AERO
2.0000 | INHALATION_SPRAY | Freq: Two times a day (BID) | RESPIRATORY_TRACT | Status: DC
  Administered 2024-03-10 – 2024-03-12 (×5): 2 via RESPIRATORY_TRACT
  Filled 2024-03-09: qty 5.9

## 2024-03-09 MED ORDER — PREDNISONE 10 MG PO TABS
40.0000 mg | ORAL_TABLET | Freq: Every day | ORAL | Status: DC
  Administered 2024-03-10 – 2024-03-12 (×3): 40 mg via ORAL
  Filled 2024-03-09 (×3): qty 4

## 2024-03-09 MED ORDER — POLYETHYLENE GLYCOL 3350 17 G PO PACK
17.0000 g | PACK | Freq: Every day | ORAL | Status: DC
  Administered 2024-03-10 – 2024-03-11 (×2): 17 g via ORAL
  Filled 2024-03-09 (×2): qty 1

## 2024-03-09 MED ORDER — IBUPROFEN 200 MG PO TABS
600.0000 mg | ORAL_TABLET | Freq: Once | ORAL | Status: AC | PRN
  Administered 2024-03-11: 600 mg via ORAL
  Filled 2024-03-09: qty 3

## 2024-03-09 MED ORDER — SENNA 8.6 MG PO TABS
1.0000 | ORAL_TABLET | Freq: Every day | ORAL | Status: DC
  Administered 2024-03-09 – 2024-03-11 (×3): 8.6 mg via ORAL
  Filled 2024-03-09 (×3): qty 1

## 2024-03-09 MED ORDER — KETOROLAC TROMETHAMINE 15 MG/ML IJ SOLN
15.0000 mg | Freq: Once | INTRAMUSCULAR | Status: AC
  Administered 2024-03-09: 15 mg via INTRAVENOUS
  Filled 2024-03-09: qty 1

## 2024-03-09 NOTE — Assessment & Plan Note (Signed)
 Secondary to para flu.  Respiratory status stable on 2 L nasal cannula. -Continue solumedrol q12h, duonebs q4h, continue home breztri  inhaler  - Likely switch to p.o. steroids after 48 hours of IV - supplemental O2, titrate to O2 goal of 88% - 92% - continue azithromycin  for 3 more doses (12/30-1/3) - continous pulse ox - DuoNebs every 4 hours - continue home descovy  and tivicay   - AM CBC, BMP  - Previously well-controlled HIV last labs August - repeat CD4 cell count, HIV RNA quant

## 2024-03-09 NOTE — Assessment & Plan Note (Signed)
 Neuropathy: continue gabapentin  100 mg with breakfast and lunch and 300 at bedtime,  Arthritis: continue voltaren  gel, tylenol  prn for pain HLD: continue crestor  10 mg

## 2024-03-09 NOTE — Plan of Care (Signed)

## 2024-03-09 NOTE — Plan of Care (Signed)

## 2024-03-09 NOTE — Care Management Obs Status (Signed)
 MEDICARE OBSERVATION STATUS NOTIFICATION   Patient Details  Name: Courtney Hamilton MRN: [id number] Date of Birth: 1961/02/02   Medicare Observation Status Notification Given:  Yes    [name], RN 03/09/2024, 9:34 AM

## 2024-03-09 NOTE — Progress Notes (Signed)
" °   03/09/24 1007  TOC ED Mini Assessment  Admission or Readmission Diverted Yes  Means of departure Car  Choice offered to / list presented to  NA   MOON letter signed and given.   Transition of Care [facility]) - Emergency Department Mini Assessment   Patient Details  Name: Courtney Hamilton MRN: [id number] Date of Birth: March 26, 1960  Transition of Care [facility]) CM/SW Contact:    [name], RN Phone Number: 03/09/2024, 10:07 AM   Clinical Narrative:  RNCM spoke to patient regarding d/c plans. Pt. Returning home, spouse will transport. No needs at this time from CM. Will continue to follow.  ED Mini Assessment:          Means of departure: (P) Car       Patient Contact and Communications        ,              Choice offered to / list presented to : (P) NA  Admission diagnosis:  COPD with acute exacerbation (HCC) [J44.1] Patient Active Problem List   Diagnosis Date Noted   COPD with acute exacerbation (HCC) 03/08/2024   Chronic health problem 03/08/2024   Acute respiratory failure with hypoxia (HCC) 03/08/2024   COPD exacerbation (HCC) 03/08/2024   BI-RADS category 3 mammogram result 09/23/2023   Hematochezia 05/24/2023   Diverticulosis 05/24/2023   Prediabetes 04/01/2023   Obesity (BMI 30-39.9) 12/22/2022   Biceps tendinitis of right shoulder 11/03/2022   Subacromial impingement of right shoulder 06/30/2022   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   Cervical stenosis (uterine cervix)    Essential hypertension 08/05/2017   Insomnia 11/05/2015   Former cigarette smoker 11/05/2015   Lumbosacral spondylosis without myelopathy 09/29/2013   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 [place] [address] Phone: [phone] Fax: [phone]   "

## 2024-03-09 NOTE — Assessment & Plan Note (Signed)
 Moderately elevated on admission in the setting of above COPD exacerbation.  Has improved with resolution of headache. - home cozaar  50 mg  - tylenol  prn for headaches - CTM

## 2024-03-09 NOTE — Progress Notes (Signed)
" ° ° ° °  Daily Progress Note Intern Pager: [phone]  Patient name: Courtney Hamilton Medical record number: [id number] Date of birth: 02-26-1961 Age: 64 y.o. Gender: female  Primary Care Provider: Alba Sharper, MD Consultants: None Code Status: Full code  Pt Overview and Major Events to Date:  -Admitted 03/08/2024  Medical Decision Making:  Courtney Hamilton is a 64 year old female with dyspnea and cough secondary to COPD exacerbation.  Over 24 hours of treatment she has started to improve.  Continue current treatment course.  Pertinent PMH/PSH includes well-controlled HIV, COPD, hypertension.  Assessment & Plan COPD with acute exacerbation (HCC) HIV (human immunodeficiency virus infection) (HCC) Secondary to para flu.  Respiratory status stable on 2 L nasal cannula. -Continue solumedrol q12h, duonebs q4h, continue home breztri  inhaler  - Likely switch to p.o. steroids after 48 hours of IV - supplemental O2, titrate to O2 goal of 88% - 92% - continue azithromycin  for 3 more doses (12/30-1/3) - continous pulse ox - DuoNebs every 4 hours - continue home descovy  and tivicay   - AM CBC, BMP  - Previously well-controlled HIV last labs August - repeat CD4 cell count, HIV RNA quant Essential hypertension Moderately elevated on admission in the setting of above COPD exacerbation.  Has improved with resolution of headache. - home cozaar  50 mg  - tylenol  prn for headaches - CTM Chronic health problem Neuropathy: continue gabapentin  100 mg with breakfast and lunch and 300 at bedtime,  Arthritis: continue voltaren  gel, tylenol  prn for pain HLD: continue crestor  10 mg    FEN/GI: Regular diet PPx: Lovenox Dispo: Pending further clinical improvement  Subjective:  No acute events overnight.  Patient says she feels overall significantly better.  States the Toradol  helped significantly with her headache.  Feels like her breathing is doing better.  Objective: Temp:  [97.9 F (36.6 C)-98.5 F  (36.9 C)] 98.5 F (36.9 C) (12/31 1518) Pulse Rate:  [70-119] 70 (01/01 0330) Resp:  [11-24] 16 (01/01 0330) BP: (126-170)/(84-123) 140/106 (12/31 2045) SpO2:  [81 %-100 %] 98 % (01/01 0330) Weight:  [83.9 kg] 83.9 kg (12/31 0549) Physical Exam: General: NAD, tired appearing comfortable Neuro: A&O Cardiovascular: RRR, no murmurs, intermittently tachycardic Respiratory: normal WOB on 2 L nasal cannula, CTAB, bilateral upper lobe expiratory wheezes present Extremities: Moving all 4 extremities equally  Laboratory: Most recent CBC Lab Results  Component Value Date   WBC 11.4 (H) 03/09/2024   HGB 13.0 03/09/2024   HCT 40.5 03/09/2024   MCV 91.4 03/09/2024   PLT 249 03/09/2024   Most recent BMP    Latest Ref Rng & Units 03/09/2024    1:05 AM  BMP  Glucose 70 - 99 mg/dL 856   BUN 8 - 23 mg/dL 24   Creatinine 9.55 - 1.00 mg/dL 8.99   Sodium 864 - 854 mmol/L 138   Potassium 3.5 - 5.1 mmol/L 4.3   Chloride 98 - 111 mmol/L 103   CO2 22 - 32 mmol/L 24   Calcium  8.9 - 10.3 mg/dL 89.9      Imaging/Diagnostic Tests: No new imaging.  Alba Sharper, MD 03/09/2024, 5:45 AM  PGY-3, [facility] Family Medicine FPTS Intern pager: [phone], text pages welcome Secure chat group [facility] Teaching Service    "

## 2024-03-10 DIAGNOSIS — J441 Chronic obstructive pulmonary disease with (acute) exacerbation: Secondary | ICD-10-CM | POA: Diagnosis not present

## 2024-03-10 LAB — BASIC METABOLIC PANEL WITH GFR
Anion gap: 11 (ref 5–15)
BUN: 28 mg/dL — ABNORMAL HIGH (ref 8–23)
CO2: 26 mmol/L (ref 22–32)
Calcium: 9.7 mg/dL (ref 8.9–10.3)
Chloride: 103 mmol/L (ref 98–111)
Creatinine, Ser: 0.92 mg/dL (ref 0.44–1.00)
GFR, Estimated: >60 mL/min
Glucose, Bld: 126 mg/dL — ABNORMAL HIGH (ref 70–99)
Potassium: 4.3 mmol/L (ref 3.5–5.1)
Sodium: 140 mmol/L (ref 135–145)

## 2024-03-10 LAB — T-HELPER CELLS (CD4) COUNT (NOT AT ARMC)
CD4 % Helper T Cell: 12 % — ABNORMAL LOW (ref 33–65)
CD4 T Cell Abs: 129 /uL — ABNORMAL LOW (ref 400–1790)

## 2024-03-10 LAB — CBC
HCT: 39.5 % (ref 36.0–46.0)
Hemoglobin: 12.9 g/dL (ref 12.0–15.0)
MCH: 28.9 pg (ref 26.0–34.0)
MCHC: 32.7 g/dL (ref 30.0–36.0)
MCV: 88.6 fL (ref 80.0–100.0)
Platelets: 250 K/uL (ref 150–400)
RBC: 4.46 MIL/uL (ref 3.87–5.11)
RDW: 13 % (ref 11.5–15.5)
WBC: 12.6 K/uL — ABNORMAL HIGH (ref 4.0–10.5)
nRBC: 0 % (ref 0.0–0.2)

## 2024-03-10 MED ORDER — GUAIFENESIN ER 600 MG PO TB12
600.0000 mg | ORAL_TABLET | Freq: Two times a day (BID) | ORAL | Status: DC
  Administered 2024-03-10 – 2024-03-12 (×5): 600 mg via ORAL
  Filled 2024-03-10 (×5): qty 1

## 2024-03-10 NOTE — TOC CM/SW Note (Signed)
 Transition of Care [facility]) - Inpatient Brief Assessment   Patient Details  Name: Courtney Hamilton MRN: [id number] Date of Birth: [birth year]  Transition of Care [facility]) CM/SW Contact:    Lauraine FORBES Saa, LCSWA Phone Number: 03/10/2024, 9:10 AM   Clinical Narrative:  9:10 AM Per chart review, patient resides at home with spouse and parent(s). Patient has a PCP and insurance on file. Patient appears to have HHRN history from 2017 on chart. Patient does not have SNF or DME history from chart. Patient's preferred pharmacy [facility] is [facility]. No TOC needs identified at this time. TOC will continue to follow.  Transition of Care Asessment: Insurance and Status: Insurance coverage has been reviewed Patient has primary care physician: Yes Home environment has been reviewed: Private Residence Prior level of function:: N/A Prior/Current Home Services: No current home services Social Drivers of Health Review: SDOH reviewed no interventions necessary Readmission risk has been reviewed: Yes (Currently Observation Status) Transition of care needs: no transition of care needs at this time

## 2024-03-10 NOTE — Plan of Care (Signed)

## 2024-03-10 NOTE — Progress Notes (Signed)
" ° ° ° °  Daily Progress Note Intern Pager: [phone]  Patient name: Courtney Hamilton Medical record number: [id number] Date of birth: 04/24/1960 Age: 64 y.o. Gender: female  Primary Care Provider: Alba Sharper, MD Consultants: None Code Status: Full  Pt Overview and Major Events to Date:  12/31: Admitted  Assessment and Plan:  This is a 64 year old female patient with dyspnea and cough secondary to COPD exacerbation.  She is improving with standard treatment.  Pertinent PMH/PSH includes well-controlled HIV, COPD, hypertension Assessment & Plan COPD with acute exacerbation (HCC) HIV (human immunodeficiency virus infection) (HCC) Patient has been stable on room air saturating greater than 95% for 24 hours at this time.  Still very wheezy with productive cough.  -Continue duonebs q4h, continue home breztri  inhaler  - Transition to oral prednisone  daily starting today - supplemental O2, titrate to O2 goal of 88% - 92% - continue azithromycin  (12/30-1/3) - continous pulse ox - Add on mucinex  PRN, flutter valve - continue home descovy  and tivicay   - repeat CD4 cell count, HIV RNA quant pending Essential hypertension Remains elevated, would consider increasing dose of Cozaar  versus adding second agent if still elevated outpatient - home cozaar  50 mg  - tylenol  prn for headaches - CTM Chronic health problem Neuropathy: continue gabapentin  100 mg with breakfast and lunch and 300 at bedtime,  Arthritis: continue voltaren  gel, tylenol  prn for pain HLD: continue crestor  10 mg   FEN/GI: Regular PPx: Lovenox Dispo:Home pending clinical improvement .    Subjective:  Patient is alert sitting up in bed on exam this morning.  She is noticeably short of breath, and uncomfortable.  She also reports a building headache.  Objective: Temp:  [97.5 F (36.4 C)-98.9 F (37.2 C)] 98.8 F (37.1 C) (01/02 0725) Pulse Rate:  [59-81] 69 (01/02 0725) Resp:  [16-20] 16 (01/02 0725) BP:  (130-164)/(87-100) 156/100 (01/02 0725) SpO2:  [93 %-100 %] 97 % (01/02 0725) Physical Exam: General: Well-appearing Cardiovascular: RRR, no M/R/G Respiratory: Diffuse bilateral end expiratory wheezing, able to speak in full sentences but does appear short of breath. Abdomen: Flat soft nontender Extremities: Warm and well-perfused  Laboratory: Most recent CBC Lab Results  Component Value Date   WBC 12.6 (H) 03/10/2024   HGB 12.9 03/10/2024   HCT 39.5 03/10/2024   MCV 88.6 03/10/2024   PLT 250 03/10/2024   Most recent BMP    Latest Ref Rng & Units 03/10/2024    4:11 AM  BMP  Glucose 70 - 99 mg/dL 873   BUN 8 - 23 mg/dL 28   Creatinine 9.55 - 1.00 mg/dL 9.07   Sodium 864 - 854 mmol/L 140   Potassium 3.5 - 5.1 mmol/L 4.3   Chloride 98 - 111 mmol/L 103   CO2 22 - 32 mmol/L 26   Calcium  8.9 - 10.3 mg/dL 9.7     Cleotilde Lukes, DO 03/10/2024, 8:17 AM  PGY-2, [facility] FPTS Intern pager: [phone], text pages welcome Secure chat group [facility] Teaching Service   "

## 2024-03-10 NOTE — Assessment & Plan Note (Addendum)
 Patient has been stable on room air saturating greater than 95% for 24 hours at this time.  Still very wheezy with productive cough.  -Continue duonebs q4h, continue home breztri  inhaler  - Transition to oral prednisone  daily starting today - supplemental O2, titrate to O2 goal of 88% - 92% - continue azithromycin  (12/30-1/3) - continous pulse ox - Add on mucinex  PRN, flutter valve - continue home descovy  and tivicay   - repeat CD4 cell count, HIV RNA quant pending

## 2024-03-10 NOTE — Assessment & Plan Note (Addendum)
 Remains elevated, would consider increasing dose of Cozaar  versus adding second agent if still elevated outpatient - home cozaar  50 mg  - tylenol  prn for headaches - CTM

## 2024-03-10 NOTE — Assessment & Plan Note (Signed)
 Neuropathy: continue gabapentin  100 mg with breakfast and lunch and 300 at bedtime,  Arthritis: continue voltaren  gel, tylenol  prn for pain HLD: continue crestor  10 mg

## 2024-03-11 DIAGNOSIS — J441 Chronic obstructive pulmonary disease with (acute) exacerbation: Secondary | ICD-10-CM | POA: Diagnosis not present

## 2024-03-11 LAB — CBC
HCT: 40.8 % (ref 36.0–46.0)
Hemoglobin: 13.2 g/dL (ref 12.0–15.0)
MCH: 29.2 pg (ref 26.0–34.0)
MCHC: 32.4 g/dL (ref 30.0–36.0)
MCV: 90.3 fL (ref 80.0–100.0)
Platelets: 232 K/uL (ref 150–400)
RBC: 4.52 MIL/uL (ref 3.87–5.11)
RDW: 13 % (ref 11.5–15.5)
WBC: 14.6 K/uL — ABNORMAL HIGH (ref 4.0–10.5)
nRBC: 0 % (ref 0.0–0.2)

## 2024-03-11 LAB — HIV-1 RNA QUANT-NO REFLEX-BLD
HIV 1 RNA Quant: <20 {copies}/mL
LOG10 HIV-1 RNA: UNDETERMINED {Log_copies}/mL

## 2024-03-11 MED ORDER — SENNA 8.6 MG PO TABS
1.0000 | ORAL_TABLET | Freq: Two times a day (BID) | ORAL | Status: DC
  Administered 2024-03-11: 8.6 mg via ORAL
  Filled 2024-03-11 (×2): qty 1

## 2024-03-11 MED ORDER — SODIUM CHLORIDE 0.9 % IV SOLN: 2.0000 g | INTRAVENOUS | Status: DC

## 2024-03-11 MED ORDER — IPRATROPIUM-ALBUTEROL 0.5-2.5 (3) MG/3ML IN SOLN
3.0000 mL | RESPIRATORY_TRACT | Status: DC | PRN
  Administered 2024-03-12: 3 mL via RESPIRATORY_TRACT
  Filled 2024-03-11: qty 3

## 2024-03-11 MED ORDER — CEFADROXIL 500 MG PO CAPS
500.0000 mg | ORAL_CAPSULE | Freq: Two times a day (BID) | ORAL | Status: DC
  Administered 2024-03-11 (×2): 500 mg via ORAL
  Filled 2024-03-11 (×2): qty 1

## 2024-03-11 MED ORDER — POLYETHYLENE GLYCOL 3350 17 G PO PACK
17.0000 g | PACK | Freq: Two times a day (BID) | ORAL | Status: DC
  Administered 2024-03-11 – 2024-03-12 (×2): 17 g via ORAL
  Filled 2024-03-11 (×2): qty 1

## 2024-03-11 NOTE — Assessment & Plan Note (Signed)
 Neuropathy: continue gabapentin  100 mg with breakfast and lunch and 300 at bedtime.  Arthritis: continue voltaren  gel, tylenol  PRN for pain HLD: continue crestor  10 mg

## 2024-03-11 NOTE — Progress Notes (Incomplete)
" ° ° ° °  Daily Progress Note Intern Pager: [phone]  Patient name: Courtney Hamilton Medical record number: [id number] Date of birth: January 04, 1961 Age: 64 y.o. Gender: female  Primary Care Provider: Alba Sharper, MD Consultants: none Code Status: FULL  Pt Overview and Major Events to Date:  12/31 - admitted to FMTS   Medical Decision Making: Courtney Hamilton is a 64 year old F with dyspnea and cough secondary to COPD exacerbation. She is improving with standard treatment. *** Pertinent PMHx includes well-controlled HIV, COPD, HTN Assessment & Plan COPD with acute exacerbation (HCC) HIV (human immunodeficiency virus infection) (HCC) *** IV antibiotics transitioned to PO 1/3 and patient has tolerated this well. Repeat CD4 *** and HIV undetectable.  Stable on RA, maintaining O2 sats overnight. Still with wheezing throughout, some cough.  - Continue home Breztri  inhaler BID, duonebs q4h PRN - Prednisone  40 mg PO daily - Continue cefadroxil  500 mg BID (1/3 - ***)  - Mucinex  BID, flutter valve - continue home descovy  and tivicay   Essential hypertension ***  Mildly elevated; consider increasing dose of Cozaar  versus adding second agent if still elevated outpatient. No HA this AM. - home cozaar  50 mg  - tylenol  PRN for headaches - CTM Chronic health problem Neuropathy: continue gabapentin  100 mg with breakfast and lunch and 300 at bedtime.  Arthritis: continue voltaren  gel, tylenol  PRN for pain HLD: continue crestor  10 mg   FEN/GI: Regular diet PPx: Lovenox  Dispo: Home pending clinical improvement.   Subjective:  ***  Objective: Temp:  [97.9 F (36.6 C)-98.6 F (37 C)] 98 F (36.7 C) (01/03 2044) Pulse Rate:  [67-75] 71 (01/03 2044) Resp:  [16-22] 16 (01/03 2044) BP: (131-158)/(76-99) 158/96 (01/03 2044) SpO2:  [95 %-96 %] 95 % (01/03 2044) Physical Exam: General: *** Cardiovascular: *** Respiratory: *** Abdomen: *** Extremities: ***  Laboratory: Most recent CBC Lab  Results  Component Value Date   WBC 14.6 (H) 03/11/2024   HGB 13.2 03/11/2024   HCT 40.8 03/11/2024   MCV 90.3 03/11/2024   PLT 232 03/11/2024   Most recent BMP    Latest Ref Rng & Units 03/10/2024    4:11 AM  BMP  Glucose 70 - 99 mg/dL 873   BUN 8 - 23 mg/dL 28   Creatinine 9.55 - 1.00 mg/dL 9.07   Sodium 864 - 854 mmol/L 140   Potassium 3.5 - 5.1 mmol/L 4.3   Chloride 98 - 111 mmol/L 103   CO2 22 - 32 mmol/L 26   Calcium  8.9 - 10.3 mg/dL 9.7     Lafe Domino, DO 03/11/2024, 9:30 PM  PGY-2, [facility] FPTS Intern pager: [phone], text pages welcome Secure chat group [facility] Teaching Service   "

## 2024-03-11 NOTE — Plan of Care (Signed)
  Problem: Education: Goal: Knowledge of General Education information will improve Description: Including pain rating scale, medication(s)/side effects and non-pharmacologic comfort measures Outcome: Progressing   Problem: Health Behavior/Discharge Planning: Goal: Ability to manage health-related needs will improve Outcome: Progressing   Problem: Activity: Goal: Risk for activity intolerance will decrease Outcome: Progressing   Problem: Clinical Measurements: Goal: Will remain free from infection Outcome: Not Progressing

## 2024-03-11 NOTE — Progress Notes (Addendum)
" ° ° ° °  Daily Progress Note Intern Pager: [phone]  Patient name: Courtney Hamilton Medical record number: [id number] Date of birth: September 24, 1960 Age: 64 y.o. Gender: female  Primary Care Provider: Alba Sharper, MD Consultants: none Code Status: FULL  Pt Overview and Major Events to Date:  12/31 - admitted to FMTS  Medical Decision Making: Courtney Hamilton is a 64 year old F with dyspnea and cough secondary to COPD exacerbation. She is improving with standard treatment. Pertinent PMHx includes well-controlled HIV, COPD, HTN. Assessment & Plan COPD with acute exacerbation (HCC) HIV (human immunodeficiency virus infection) (HCC) Stable on RA, maintaining O2 sats overnight. Still with wheezing throughout, some cough.  - Continue duonebs q4h, continue home breztri  inhaler  - Prednisone  40 mg PO daily - transition CTX to PO abx today - continous pulse ox - Add on mucinex  PRN, flutter valve - continue home descovy  and tivicay   - repeat CD4 cell count, HIV RNA quant pending Essential hypertension Mildly elevated; consider increasing dose of Cozaar  versus adding second agent if still elevated outpatient. No HA this AM. - home cozaar  50 mg  - tylenol  prn for headaches - CTM Chronic health problem Neuropathy: continue gabapentin  100 mg with breakfast and lunch and 300 at bedtime,  Arthritis: continue voltaren  gel, tylenol  prn for pain HLD: continue crestor  10 mg   FEN/GI: Regular diet PPx: Lovenox  Dispo: Home pending clinical improvement.   Subjective:  NAEON. Resting comfortably, no concerns.  Objective: Temp:  [98.1 F (36.7 C)-98.3 F (36.8 C)] 98.2 F (36.8 C) (01/02 2002) Pulse Rate:  [69-81] 73 (01/03 0441) Resp:  [16-18] 18 (01/03 0441) BP: (147-156)/(90-99) 147/99 (01/03 0441) SpO2:  [94 %-96 %] 95 % (01/03 0441) Physical Exam: General: sleeping comfortably ,NAD Cardiovascular: RRR Respiratory: Wheezing, no increased WOB Neuro: A&O  Laboratory: Most recent CBC Lab  Results  Component Value Date   WBC 14.6 (H) 03/11/2024   HGB 13.2 03/11/2024   HCT 40.8 03/11/2024   MCV 90.3 03/11/2024   PLT 232 03/11/2024   Most recent BMP    Latest Ref Rng & Units 03/10/2024    4:11 AM  BMP  Glucose 70 - 99 mg/dL 873   BUN 8 - 23 mg/dL 28   Creatinine 9.55 - 1.00 mg/dL 9.07   Sodium 864 - 854 mmol/L 140   Potassium 3.5 - 5.1 mmol/L 4.3   Chloride 98 - 111 mmol/L 103   CO2 22 - 32 mmol/L 26   Calcium  8.9 - 10.3 mg/dL 9.7     Courtney Domino, DO 03/11/2024, 7:51 AM  PGY-2, [place] [facility] FPTS Intern pager: [phone], text pages welcome Secure chat group [facility] Teaching Service   "

## 2024-03-11 NOTE — Plan of Care (Signed)

## 2024-03-11 NOTE — Assessment & Plan Note (Signed)
***   IV antibiotics transitioned to PO 1/3 and patient has tolerated this well. Repeat CD4 *** and HIV undetectable.  Stable on RA, maintaining O2 sats overnight. Still with wheezing throughout, some cough.  - Continue home Breztri  inhaler BID, duonebs q4h PRN - Prednisone  40 mg PO daily - Continue cefadroxil  500 mg BID (1/3 - ***)  - Mucinex  BID, flutter valve - continue home descovy  and tivicay

## 2024-03-11 NOTE — Assessment & Plan Note (Addendum)
 Mildly elevated; consider increasing dose of Cozaar  versus adding second agent if still elevated outpatient. No HA this AM. - home cozaar  50 mg  - tylenol  prn for headaches - CTM

## 2024-03-11 NOTE — Assessment & Plan Note (Addendum)
 Stable on RA, maintaining O2 sats overnight. Still with wheezing throughout, some cough.  - Continue duonebs q4h, continue home breztri  inhaler  - Prednisone  40 mg PO daily - transition CTX to PO abx today - continous pulse ox - Add on mucinex  PRN, flutter valve - continue home descovy  and tivicay   - repeat CD4 cell count, HIV RNA quant pending

## 2024-03-11 NOTE — Plan of Care (Signed)
 Met with patient at the bedside to inform her of the undetectable RNA quant for HIV. She asked about her increasing WBC. I explained that she is otherwise clinically improving with no fever or hypoxia and WBC may be due to steroids she is receiving. She said she still is coughing a lot and gets short of breath with exertion. I encouraged her to use prn duonebs which she did not realize she had access to and mentioned we would do an ambulatory pulse ox test tomorrow. She did not have further concerns.   Areta Saliva, MD  PGY 3 [facility]

## 2024-03-11 NOTE — Assessment & Plan Note (Signed)
 Pressures continue to be intermittently elevated, patient asymptomatic, specifically no headache this AM. - home cozaar  50 mg  - tylenol  PRN for headaches - CTM

## 2024-03-11 NOTE — Assessment & Plan Note (Addendum)
 Neuropathy: continue gabapentin  100 mg with breakfast and lunch and 300 at bedtime,  Arthritis: continue voltaren  gel, tylenol  prn for pain HLD: continue crestor  10 mg

## 2024-03-11 NOTE — Discharge Summary (Incomplete)
 "  Family Medicine Teaching [facility] Discharge Summary  Patient name: Courtney Hamilton Medical record number: [id number] Date of birth: Feb 10, 1961 Age: 64 y.o. Gender: female Date of Admission: 03/08/2024  Date of Discharge: 03/12/2024 Admitting Physician: Winn Ogren, MD  Primary Care Provider: Alba Sharper, MD Consultants: none  Indication for Hospitalization: COPD exacerbation  Brief Hospital Course:  Courtney Hamilton is a 64 y.o.female with a history of COPD, HIV, hypertension who was admitted to the family medicine teaching Service at [facility] for COPD exacerbation. Her hospital course is detailed below:  COPD excerbation Parainfluenza Virus  Patient presented to the ED on 12/31 with increased work of breathing and shortness of breath.  Patient reported increased white phlegm production and no relief at home with home nebulizers.  She was initially treated in the ED with magnesium , Solu-Medrol , DuoNebs and discharged on azithromycin .  She returned to the ED on 12/31 after her symptoms were not improving.  On arrival patient was tachypneic but satting well on room air.  However she did have a desaturation to 81% and was put on 2 L nasal cannula.  Patient was treated with another dose of Solu-Medrol  in the ED and started on DuoNebs.  She was transition to oral steroids on 1/2 for a full course of steroids.  Azithromycin  was started on 12/30 to end on 1/3.  RPP was ordered which was positive for parainfluenza. Antibiotic coverage as follows: Azithromycin  (12/30 - 12/31) transitioned to IV CTX (12/31 - 1/2), and finally to PO cefuroxime  (1/3 - 1/8). Patient was able to ambulate with a pulse ox of 97% at rest and 95% when ambulating prior to discharge. Patient did have leukocytosis at discharge though otherwise clinically well thought to be due to steroids.   Given history of HIV and remote history of TB infection, considered treatment for PJP.  However her most recent CD4 count was  approximately 400, so did not order PJP prophylaxis.  Repeat CD4 count in hospital showed it was low and HIV RNA quant was undetectable. Based on those results patient was continued on the same therapy per recommendation of infectious disease team.   Hypertension Patient initially presented to the ED with elevated BPs (140s/100s) and headache.  Patient did miss medication prior to arrival.  After restarting home Cozaar  50 mg and controlling respiratory symptoms, hypertension and headache improved.  Patient was normotensive prior to discharge.   Other chronic conditions were medically managed with home medications and formulary alternatives as necessary (neuropathy, arthritis, HLD)  PCP Follow-up Recommendations: Consider adjusting BP medications if elevated outpatient 2. Repeat CBC and BMP at follow up  Discharge Diagnoses/Problem List:  Principal Problem:   COPD with acute exacerbation (HCC) Active Problems:   HIV (human immunodeficiency virus infection) (HCC)   History of tuberculosis exposure   Former cigarette smoker   Essential hypertension   COPD with asthma (HCC)   Prediabetes   Acute respiratory failure with hypoxia (HCC), now resolved    COPD exacerbation (HCC)  Disposition: home  Discharge Condition: stable  Discharge Exam:  General: sleeping comfortably, NAD Cardiovascular: RRR Respiratory: Mild wheezing, in increased WOB. Neuro: A&O  Significant Procedures: none  Significant Labs and Imaging:  Recent Labs  Lab 03/11/24 0651  WBC 14.6*  HGB 13.2  HCT 40.8  PLT 232   CXR 03/08/2024 IMPRESSION: 1. Hyperinflation and chronic coarsened interstitial markings. 2. No acute findings.  Results/Tests Pending at Time of Discharge: none  Discharge Medications:  Allergies as of 03/12/2024  Reactions   Baclofen  Other (See Comments)   rash   Bactrim [sulfamethoxazole-trimethoprim] Rash   Doxycycline  Rash        Medication List     STOP taking these  medications    azithromycin  250 MG tablet Commonly known as: ZITHROMAX    celecoxib  100 MG capsule Commonly known as: CeleBREX    mupirocin  ointment 2 % Commonly known as: BACTROBAN        TAKE these medications    albuterol  108 (90 Base) MCG/ACT inhaler Commonly known as: VENTOLIN  HFA TAKE 2 PUFFS BY MOUTH EVERY 6 HOURS AS NEEDED FOR WHEEZE OR SHORTNESS OF BREATH What changed: Another medication with the same name was added. Make sure you understand how and when to take each.   albuterol  1.25 MG/3ML nebulizer solution Commonly known as: ACCUNEB  Take 3 mLs (1.25 mg total) by nebulization every 6 (six) hours as needed for wheezing or shortness of breath. What changed: You were already taking a medication with the same name, and this prescription was added. Make sure you understand how and when to take each.   Breztri  Aerosphere 160-9-4.8 MCG/ACT Aero inhaler Generic drug: budesonide -glycopyrrolate -formoterol  INHALE 2 PUFFS INTO THE LUNGS 2 TIMES DAILY. RINSE MOUTH AFTER USE TO AVOID THRUSH.   cefUROXime  500 MG tablet Commonly known as: CEFTIN  Take 1 tablet (500 mg total) by mouth 2 (two) times daily with a meal for 5 days.   Descovy  200-25 MG tablet Generic drug: emtricitabine -tenofovir  AF Take 1 tablet by mouth daily.   diclofenac  Sodium 1 % Gel Commonly known as: VOLTAREN  APPLY 2 GRAMS TO AFFECTED AREA 4 TIMES A DAY What changed: See the new instructions.   estradiol  0.1 mg/24hr patch Commonly known as: CLIMARA  - Dosed in mg/24 hr PLACE 1 PATCH (0.1 MG TOTAL) ONTO THE SKIN ONCE A WEEK.   ESTROVEN MOOD & MEMORY PO Take 1 tablet by mouth daily.   gabapentin  100 MG capsule Commonly known as: NEURONTIN  TAKE 1 IN THE MORNING AND AT LUNCH FOR YOUR BACK PAIN. TAKE 3 AT NIGHT BEFORE YOU GO TO SLEEP. What changed: See the new instructions.   hydrocortisone  25 MG suppository Commonly known as: ANUSOL -HC UNWRAP AND PLACE 1 SUPPOSITORY RECTALLY EVERY 12 HOURS FOR 12  DAYS. What changed: See the new instructions.   losartan  50 MG tablet Commonly known as: COZAAR  TAKE 1 TABLET BY MOUTH EVERY DAY   predniSONE  20 MG tablet Commonly known as: DELTASONE  Take 2 tablets (40 mg total) by mouth daily with breakfast for 1 dose. What changed: when to take this   rosuvastatin  10 MG tablet Commonly known as: CRESTOR  Take 1 tablet (10 mg total) by mouth daily.   Tivicay  50 MG tablet Generic drug: dolutegravir  Take 1 tablet (50 mg total) by mouth daily.   traMADol  50 MG tablet Commonly known as: ULTRAM  Take 1 tablet (50 mg total) by mouth 3 (three) times daily as needed for moderate pain (pain score 4-6). Please make appointment before next refill.   Vitamin D3 25 MCG (1000 UT) Caps Take 1,000 Units by mouth daily.               Durable Medical Equipment  (From admission, onward)           Start     Ordered   03/12/24 0932  For home use only DME Nebulizer machine  Once       Question Answer Comment  Patient needs a nebulizer to treat with the following condition COPD (chronic obstructive pulmonary disease) (  HCC)   Length of Need 12 Months   Additional equipment included Administration kit   Additional equipment included Filter      03/12/24 0931            Discharge Instructions: Please refer to Patient Instructions section of EMR for full details.  Patient was counseled important signs and symptoms that should prompt return to medical care, changes in medications, dietary instructions, activity restrictions, and follow up appointments.   Follow-Up Appointments:  Future Appointments  Date Time Provider Department Center  03/17/2024  2:10 PM Alba Sharper, MD [facility]  03/28/2024  2:40 PM [facility] DIAG TOMO 1 [facility]  08/31/2024  2:15 PM Paci, Rufus HERO, MD [facility] None  11/16/2024 10:30 AM [facility] ANNUAL WELLNESS VISIT [facility]    Nicholas Bar, MD 03/12/2024, 2:39 PM PGY-3, [place]  "

## 2024-03-12 DIAGNOSIS — J441 Chronic obstructive pulmonary disease with (acute) exacerbation: Principal | ICD-10-CM

## 2024-03-12 LAB — RESP PANEL BY RT-PCR (RSV, FLU A&B, COVID)  RVPGX2
Influenza A by PCR: NEGATIVE
Influenza B by PCR: NEGATIVE
Resp Syncytial Virus by PCR: NEGATIVE
SARS Coronavirus 2 by RT PCR: NEGATIVE

## 2024-03-12 MED ORDER — ALBUTEROL SULFATE 1.25 MG/3ML IN NEBU: 1.0000 | INHALATION_SOLUTION | Freq: Four times a day (QID) | RESPIRATORY_TRACT | 12 refills | Status: AC | PRN

## 2024-03-12 MED ORDER — CEFUROXIME AXETIL 250 MG PO TABS
500.0000 mg | ORAL_TABLET | Freq: Two times a day (BID) | ORAL | Status: DC
  Administered 2024-03-12: 500 mg via ORAL
  Filled 2024-03-12 (×2): qty 2

## 2024-03-12 MED ORDER — CEFUROXIME AXETIL 500 MG PO TABS: 500.0000 mg | ORAL_TABLET | Freq: Two times a day (BID) | ORAL | 0 refills | Status: AC

## 2024-03-12 MED ORDER — CEFUROXIME AXETIL 500 MG PO TABS: 500.0000 mg | ORAL_TABLET | Freq: Two times a day (BID) | ORAL | 0 refills | Status: DC

## 2024-03-12 MED ORDER — PREDNISONE 20 MG PO TABS: 40.0000 mg | ORAL_TABLET | Freq: Every day | ORAL | 0 refills | Status: AC

## 2024-03-12 NOTE — Progress Notes (Signed)
 Patient discharged, IV removed. Discharge education completed. Patient belongings with patient. Follow up appointments and medication discussed. Patient will go home.

## 2024-03-12 NOTE — TOC Transition Note (Signed)
 Transition of Care [facility]) - Discharge Note   Patient Details  Name: Courtney Hamilton MRN: [id number] Date of Birth: October 12, 1960  Transition of [facility]) CM/SW Contact:  Marval Gell, RN Phone Number: 03/12/2024, 9:37 AM   Clinical Narrative:      Nebulizer machine requested to be delivered to the room through [employer]        Patient Goals and CMS Choice     Choice offered to / list presented to : NA      Discharge Placement                       Discharge Plan and Services Additional resources added to the After Visit Summary for                  DME Arranged: Nebulizer machine DME Agency: [employer] Date DME Agency Contacted: 03/12/24 Time DME Agency Contacted: [phone] Representative spoke with at DME Agency: [place]            Social Drivers of Health (SDOH) Interventions SDOH Screenings   Food Insecurity: No Food Insecurity (03/08/2024)  Recent Concern: Food Insecurity - Food Insecurity Present (01/24/2024)  Housing: Low Risk (03/08/2024)  Recent Concern: Housing - High Risk (01/24/2024)  Transportation Needs: No Transportation Needs (03/08/2024)  Recent Concern: Transportation Needs - Unmet Transportation Needs (01/24/2024)  Utilities: Not At Risk (03/08/2024)  Alcohol Screen: Low Risk (11/15/2023)  Depression (PHQ2-9): Low Risk (01/25/2024)  Recent Concern: Depression (PHQ2-9) - Medium Risk (11/15/2023)  Financial Resource Strain: Low Risk (01/24/2024)  Recent Concern: Financial Resource Strain - Medium Risk (11/15/2023)  Physical Activity: Insufficiently Active (01/24/2024)  Social Connections: Moderately Integrated (03/08/2024)  Stress: Stress Concern Present (01/24/2024)  Tobacco Use: Medium Risk (03/09/2024)  Health Literacy: Adequate Health Literacy (11/15/2023)     Readmission Risk Interventions     No data to display

## 2024-03-12 NOTE — Plan of Care (Signed)
 " Problem: Education: Goal: Knowledge of General Education information will improve Description: Including pain rating scale, medication(s)/side effects and non-pharmacologic comfort measures 03/12/2024 1423 by Burnard Almarie BROCKS, RN Outcome: Adequate for Discharge 03/12/2024 0716 by Burnard Almarie BROCKS, RN Outcome: Progressing   Problem: Health Behavior/Discharge Planning: Goal: Ability to manage health-related needs will improve 03/12/2024 1423 by Burnard Almarie BROCKS, RN Outcome: Adequate for Discharge 03/12/2024 0716 by Burnard Almarie BROCKS, RN Outcome: Progressing   Problem: Clinical Measurements: Goal: Ability to maintain clinical measurements within normal limits will improve 03/12/2024 1423 by Burnard Almarie BROCKS, RN Outcome: Adequate for Discharge 03/12/2024 0716 by Burnard Almarie BROCKS, RN Outcome: Progressing Goal: Will remain free from infection 03/12/2024 1423 by Burnard Almarie BROCKS, RN Outcome: Adequate for Discharge 03/12/2024 0716 by Burnard Almarie BROCKS, RN Outcome: Progressing Goal: Diagnostic test results will improve 03/12/2024 1423 by Burnard Almarie BROCKS, RN Outcome: Adequate for Discharge 03/12/2024 0716 by Burnard Almarie BROCKS, RN Outcome: Progressing Goal: Respiratory complications will improve 03/12/2024 1423 by Burnard Almarie BROCKS, RN Outcome: Adequate for Discharge 03/12/2024 0716 by Burnard Almarie BROCKS, RN Outcome: Progressing Goal: Cardiovascular complication will be avoided 03/12/2024 1423 by Burnard Almarie BROCKS, RN Outcome: Adequate for Discharge 03/12/2024 0716 by Burnard Almarie BROCKS, RN Outcome: Progressing   Problem: Activity: Goal: Risk for activity intolerance will decrease 03/12/2024 1423 by Burnard Almarie BROCKS, RN Outcome: Adequate for Discharge 03/12/2024 0716 by Burnard Almarie BROCKS, RN Outcome: Progressing   Problem: Nutrition: Goal: Adequate nutrition will be maintained 03/12/2024 1423 by Burnard Almarie BROCKS, RN Outcome: Adequate for Discharge 03/12/2024 0716 by Burnard Almarie BROCKS, RN Outcome: Progressing   Problem: Coping: Goal: Level of anxiety will decrease 03/12/2024 1423 by Burnard Almarie BROCKS, RN Outcome: Adequate for Discharge 03/12/2024 0716 by Burnard Almarie BROCKS, RN Outcome: Progressing   Problem: Elimination: Goal: Will not experience complications related to bowel motility 03/12/2024 1423 by Burnard Almarie BROCKS, RN Outcome: Adequate for Discharge 03/12/2024 0716 by Burnard Almarie BROCKS, RN Outcome: Progressing Goal: Will not experience complications related to urinary retention 03/12/2024 1423 by Burnard Almarie BROCKS, RN Outcome: Adequate for Discharge 03/12/2024 0716 by Burnard Almarie BROCKS, RN Outcome: Progressing   Problem: Pain Managment: Goal: General experience of comfort will improve and/or be controlled 03/12/2024 1423 by Burnard Almarie BROCKS, RN Outcome: Adequate for Discharge 03/12/2024 0716 by Burnard Almarie BROCKS, RN Outcome: Progressing   Problem: Safety: Goal: Ability to remain free from injury will improve 03/12/2024 1423 by Burnard Almarie BROCKS, RN Outcome: Adequate for Discharge 03/12/2024 0716 by Burnard Almarie BROCKS, RN Outcome: Progressing   Problem: Skin Integrity: Goal: Risk for impaired skin integrity will decrease 03/12/2024 1423 by Burnard Almarie BROCKS, RN Outcome: Adequate for Discharge 03/12/2024 0716 by Burnard Almarie BROCKS, RN Outcome: Progressing   Problem: Education: Goal: Knowledge of disease or condition will improve 03/12/2024 1423 by Burnard Almarie BROCKS, RN Outcome: Adequate for Discharge 03/12/2024 0716 by Burnard Almarie BROCKS, RN Outcome: Progressing Goal: Knowledge of the prescribed therapeutic regimen will improve 03/12/2024 1423 by Burnard Almarie BROCKS, RN Outcome: Adequate for Discharge 03/12/2024 0716 by Burnard Almarie BROCKS, RN Outcome: Progressing Goal: Individualized Educational Video(s) 03/12/2024 1423 by Burnard Almarie BROCKS, RN Outcome: Adequate for Discharge 03/12/2024 0716 by Burnard Almarie BROCKS, RN Outcome: Progressing   Problem:  Activity: Goal: Ability to tolerate increased activity will improve 03/12/2024 1423 by Burnard Almarie BROCKS, RN Outcome: Adequate for Discharge 03/12/2024 0716 by Burnard Almarie BROCKS, RN Outcome: Progressing Goal: Will verbalize the importance of balancing activity with adequate rest periods  03/12/2024 1423 by Burnard Almarie BROCKS, RN Outcome: Adequate for Discharge 03/12/2024 [phone] by Burnard Almarie BROCKS, RN Outcome: Progressing   Problem: Respiratory: Goal: Ability to maintain a clear airway will improve 03/12/2024 1423 by Burnard Almarie BROCKS, RN Outcome: Adequate for Discharge 03/12/2024 0716 by Burnard Almarie BROCKS, RN Outcome: Progressing Goal: Levels of oxygenation will improve 03/12/2024 1423 by Burnard Almarie BROCKS, RN Outcome: Adequate for Discharge 03/12/2024 0716 by Burnard Almarie BROCKS, RN Outcome: Progressing Goal: Ability to maintain adequate ventilation will improve 03/12/2024 1423 by Burnard Almarie BROCKS, RN Outcome: Adequate for Discharge 03/12/2024 0716 by Burnard Almarie BROCKS, RN Outcome: Progressing   "

## 2024-03-12 NOTE — Progress Notes (Signed)
 97SATURATION QUALIFICATIONS: (This note is used to comply with regulatory documentation for home oxygen)  Patient Saturations on Room Air at Rest = 97%  Patient Saturations on Room Air while Ambulating = 95%  Patient Saturations on 0 Liters of oxygen while Ambulating = 95%  Please briefly explain why patient needs home oxygen:  Patient did not require oxygen when up ambulating

## 2024-03-12 NOTE — Plan of Care (Signed)

## 2024-03-13 ENCOUNTER — Telehealth: Payer: Self-pay

## 2024-03-13 ENCOUNTER — Inpatient Hospital Stay: Payer: Self-pay | Admitting: Family Medicine

## 2024-03-13 ENCOUNTER — Other Ambulatory Visit: Payer: Self-pay | Admitting: Family Medicine

## 2024-03-13 DIAGNOSIS — J441 Chronic obstructive pulmonary disease with (acute) exacerbation: Secondary | ICD-10-CM

## 2024-03-13 MED ORDER — BENZONATATE 100 MG PO CAPS: 100.0000 mg | ORAL_CAPSULE | Freq: Two times a day (BID) | ORAL | 0 refills | Status: AC | PRN

## 2024-03-13 NOTE — Transitions of Care (Post Inpatient/ED Visit) (Signed)
 "  03/13/2024  Name: Courtney Hamilton MRN: [id number] DOB: 23-Jul-1960  Today's TOC FU Call Status: Today's TOC FU Call Status:: Successful TOC FU Call Completed TOC FU Call Complete Date: 03/13/24  Patient's Name and Date of Birth confirmed. DOB, Name  Transition Care Management Follow-up Telephone Call Date of Discharge: 03/12/24 Discharge Facility: [facility]) Type of Discharge: Inpatient Admission Primary Inpatient Discharge Diagnosis:: COPD Exacerbation How have you been since you were released from the hospital?: Better Any questions or concerns?: Yes Patient Questions/Concerns:: (S) BP continues to be high at home, checked today and 170/80s.  Will continue to monitor and bring readings to upcoming appointment Patient Questions/Concerns Addressed: Notified Provider of Patient Questions/Concerns, Provided Patient Educational Materials  Items Reviewed: Did you receive and understand the discharge instructions provided?: Yes Medications obtained,verified, and reconciled?: Yes (Medications Reviewed) Any new allergies since your discharge?: No Dietary orders reviewed?: NA Do you have support at home?: Yes  Medications Reviewed Today: Medications Reviewed Today     Reviewed by Lavelle Charmaine NOVAK, LPN (Licensed Practical Nurse) on 03/13/24 at 1512  Med List Status: <None>   Medication Order Taking? Sig Documenting Provider Last Dose Status Informant  albuterol  (ACCUNEB ) 1.25 MG/3ML nebulizer solution [id number]  Take 3 mLs (1.25 mg total) by nebulization every 6 (six) hours as needed for wheezing or shortness of breath. Nygaard, Joseph, MD  Active   albuterol  (VENTOLIN  HFA) 108 [phone] Base) MCG/ACT inhaler [id number] No TAKE 2 PUFFS BY MOUTH EVERY 6 HOURS AS NEEDED FOR WHEEZE OR SHORTNESS OF SHERIDA Alba Sharper, MD 03/08/2024 Morning Active Self, Pharmacy Records  benzonatate  (TESSALON ) 100 MG capsule [id number]  Take 1 capsule (100 mg total) by mouth 2 (two) times daily as needed for  cough. Alba Sharper, MD  Active   Black Cohosh-Soy-Ginkgo-Magnol (ESTROVEN MOOD & MEMORY PO) [id number] No Take 1 tablet by mouth daily. [provider] Past Week Active Self, Pharmacy Records  BREZTRI  AEROSPHERE 160-9-4.8 MCG/ACT AERO [id number] No INHALE 2 PUFFS INTO THE LUNGS 2 TIMES DAILY. RINSE MOUTH AFTER USE TO AVOID THRUSH. Alba Sharper, MD 03/07/2024 Morning Active Self, Pharmacy Records  cefUROXime  (CEFTIN ) 500 MG tablet [id number]  Take 1 tablet (500 mg total) by mouth 2 (two) times daily with a meal for 5 days. Nygaard, Joseph, MD  Active   Cholecalciferol (VITAMIN D3) 25 MCG (1000 UT) capsule [id number] No Take 1,000 Units by mouth daily. [provider] 03/07/2024 Active Self, Pharmacy Records  diclofenac  Sodium (VOLTAREN ) 1 % GEL [id number] No APPLY 2 GRAMS TO AFFECTED AREA 4 TIMES A DAY  Patient taking differently: Apply 2 g topically 4 (four) times daily as needed (pain).   Alba Sharper, MD Past Week Active Self, Pharmacy Records  dolutegravir  (TIVICAY ) 50 MG tablet [id number] No Take 1 tablet (50 mg total) by mouth daily. Luiz Channel, MD 03/07/2024 Active Self, Pharmacy Records  emtricitabine -tenofovir  AF (DESCOVY ) 200-25 MG tablet [id number] No Take 1 tablet by mouth daily. Luiz Channel, MD 03/07/2024 Active Self, Pharmacy Records  estradiol  (CLIMARA  - DOSED IN MG/24 HR) 0.1 mg/24hr patch [id number] No PLACE 1 PATCH (0.1 MG TOTAL) ONTO THE SKIN ONCE A WEEK. Fredirick Glenys RAMAN, MD Past Week Active Self, Pharmacy Records  gabapentin  (NEURONTIN ) 100 MG capsule [id number] No TAKE 1 IN THE MORNING AND AT LUNCH FOR YOUR BACK PAIN. TAKE 3 AT NIGHT BEFORE YOU GO TO SLEEP.  Patient taking differently: Take 100-200 mg by mouth See admin instructions. Take 1 in the morning and 2 at night.  Alba Sharper, MD 03/07/2024 Active Self, Pharmacy Records  hydrocortisone  (ANUSOL -HC) 25 MG suppository [id number] No UNWRAP AND PLACE 1 SUPPOSITORY RECTALLY EVERY 12 HOURS FOR 12  DAYS.  Patient taking differently: Place 25 mg rectally daily.   Alba Sharper, MD Past Week Active Self, Pharmacy Records  losartan  (COZAAR ) 50 MG tablet [id number] No TAKE 1 TABLET BY MOUTH EVERY DAY Quillen, Michael, MD 03/07/2024 Active Self, Pharmacy Records  predniSONE  (DELTASONE ) 20 MG tablet [id number]  Take 2 tablets (40 mg total) by mouth daily with breakfast for 1 dose. Lorrane Pac, MD  Active   rosuvastatin  (CRESTOR ) 10 MG tablet [id number] No Take 1 tablet (10 mg total) by mouth daily. Alba Sharper, MD 03/07/2024 Active Self, Pharmacy Records  traMADol  (ULTRAM ) 50 MG tablet [id number] No Take 1 tablet (50 mg total) by mouth 3 (three) times daily as needed for moderate pain (pain score 4-6). Please make appointment before next refill. Alba Sharper, MD 03/07/2024 Active Self, Pharmacy Records            Home Care and Equipment/Supplies: Were Home Health Services Ordered?: NA Any new equipment or medical supplies ordered?: NA  Functional Questionnaire: Do you need assistance with bathing/showering or dressing?: No Do you need assistance with meal preparation?: No Do you need assistance with eating?: No Do you have difficulty maintaining continence: No Do you need assistance with getting out of bed/getting out of a chair/moving?: No Do you have difficulty managing or taking your medications?: No  Follow up appointments reviewed: PCP Follow-up appointment confirmed?: Yes Date of PCP follow-up appointment?: 03/17/24 Follow-up Provider: Dr. Alba Specialist [facility] Follow-up appointment confirmed?: NA Do you need transportation to your follow-up appointment?: No Do you understand care options if your condition(s) worsen?: Yes-patient verbalized understanding    SIGNATURE Charmaine Bloodgood, LPN [facility] Health Advisor [place] [facility] You Are. We Are. [facility] Direct Dial [phone]  "

## 2024-03-17 ENCOUNTER — Ambulatory Visit: Payer: Self-pay | Admitting: Family Medicine

## 2024-03-17 ENCOUNTER — Encounter: Payer: Self-pay | Admitting: Family Medicine

## 2024-03-17 VITALS — BP 108/69 | HR 68 | Wt 183.0 lb

## 2024-03-17 DIAGNOSIS — B009 Herpesviral infection, unspecified: Secondary | ICD-10-CM | POA: Diagnosis not present

## 2024-03-17 DIAGNOSIS — J4489 Other specified chronic obstructive pulmonary disease: Secondary | ICD-10-CM | POA: Diagnosis not present

## 2024-03-17 DIAGNOSIS — T8089XA Other complications following infusion, transfusion and therapeutic injection, initial encounter: Secondary | ICD-10-CM

## 2024-03-17 DIAGNOSIS — I10 Essential (primary) hypertension: Secondary | ICD-10-CM | POA: Diagnosis not present

## 2024-03-17 MED ORDER — ACYCLOVIR 400 MG PO TABS: 400.0000 mg | ORAL_TABLET | Freq: Three times a day (TID) | ORAL | 0 refills | Status: AC

## 2024-03-17 MED ORDER — ACYCLOVIR 5 % EX OINT: 1.0000 | TOPICAL_OINTMENT | CUTANEOUS | 1 refills | Status: AC

## 2024-03-17 NOTE — Assessment & Plan Note (Addendum)
Controlled. Continue Losartan 50 mg daily

## 2024-03-17 NOTE — Progress Notes (Signed)
" ° ° °  SUBJECTIVE:   CHIEF COMPLAINT / HPI: hospital f/u  Discussed the use of AI scribe software for clinical note transcription with the patient, who gave verbal consent to proceed.  Hospital f/u. Discharged 03/12/24. PCP Follow-up Recommendations: Consider adjusting BP medications if elevated outpatient Repeat CBC and BMP at follow up  History of Present Illness Courtney Hamilton is a 64 year old female who presents with fatigue and a herpes outbreak following recent hospitalization.  Fatigue - Persistent fatigue since hospital discharge 5 days ago, but improving - Attributes fatigue in part to recent antibiotic therapy - Completed her antibiotics yesterday - Lost approximately 12 pounds during hospitalization, attributed to illness and fluid loss  Genital herpes outbreak - Recurrent painful sores on the labia - Uses topical Zovirax  cream for outbreaks - No prior use of oral antiviral medications - No vaginal discharge, bleeding, or urinary symptoms - Okay with oral medication if cream is expensive  Bruising - New bruising on the abdomen at prior Lovenox  injection sites - Bruising present on arms at previous IV sites  Hypertension - Elevated blood pressure since hospitalization, with readings up to 194/95 - Associated morning headaches during periods of elevated blood pressure - Recent improvement in blood pressure to 116/72 - Currently taking losartan   Respiratory status - Has not used breathing treatments for several days - Breathing remains stable without increased symptoms   PERTINENT  PMH / PSH: Hypertension, COPD with asthma, diverticulosis, chronic low back pain, peripheral neuropathy, osteoporosis, controlled HIV  OBJECTIVE:   BP 108/69   Pulse 68   Wt 183 lb (83 kg)   SpO2 98%   BMI 29.54 kg/m   Physical Exam General: NAD, well appearing Neuro: A&O Cardiovascular: RRR, no murmurs Respiratory: normal WOB on RA, CTAB, no wheezes, ronchi or rales Abdomen:  soft, NTTP, no rebound or guarding, bilateral bruises from lovenox  injections while hospitalized Extremities: Moving all 4 extremities equally, no peripheral edema, right tricep bruise    ASSESSMENT/PLAN:   Assessment & Plan COPD with asthma (HCC) Respiratory status stable. Completed steroid and antibiotic courses. - Continue Breztri  - PRN albuterol  - Repeat BMP, CBC Essential hypertension Controlled. - Continue Losartan  50mg  daily Recurrent HSV (herpes simplex virus) Patient requested Zovirax  ointment, discussed that this is not as effective as oral medication.  Despite this conversation patient still requested ointment which I have sent to her pharmacy. Additionally,  sent patient oral acyclovir  dose in case that ointment is very expensive.  HIV dosing at 400mg  TID 3 times daily for 5 days. Bruising at injection site Multiple stable bruises at Lovenox  injection sites.  Expect routine healing.   Return in about 3 months (around 06/15/2024).  Courtney Provencal, MD, PGY-3 [facility] 2:15 PM 03/17/2024  [facility]   "

## 2024-03-17 NOTE — Patient Instructions (Signed)
 It was great to see you! Thank you for allowing me to participate in your care!  Our plans for today:   VISIT SUMMARY: During your visit, we addressed your fatigue, herpes outbreak, bruising, hypertension, and respiratory status following your recent hospitalization.  YOUR PLAN: CHRONIC OBSTRUCTIVE PULMONARY DISEASE WITH ASTHMA: Your breathing is well-managed post-hospitalization and you have not needed breathing treatments recently. -Continue using your Breztri  inhaler as prescribed.  RECURRENT GENITAL HERPES SIMPLEX INFECTION: You have a recent outbreak of painful sores on your labia, likely triggered by hospitalization, stress, and antibiotic use. -Use Zovirax  cream as prescribed. -If Zovirax  cream is not covered by your insurance, we may consider an oral antiviral medication. - I do recommend the acyclovir  pill anyway as it is a better treatment  ESSENTIAL HYPERTENSION: Your blood pressure has been variable due to recent illness and stress, but it has improved to 116/72. -Continue taking losartan  as prescribed.  GENERAL HEALTH MAINTENANCE: You have lost 12 pounds likely due to your recent hospitalization and illness. -We will repeat lab work to check your kidney function and hemoglobin levels.      Please arrive 15 minutes PRIOR to your next scheduled appointment time! If you do not, this affects OTHER patients' care.  Take care and seek immediate care sooner if you develop any concerns.   Courtney Provencal, MD, PGY-3 [facility] 1:59 PM 03/17/2024  [facility]

## 2024-03-17 NOTE — Assessment & Plan Note (Addendum)
 Respiratory status stable. Completed steroid and antibiotic courses. - Continue Breztri  - PRN albuterol  - Repeat BMP, CBC

## 2024-03-18 LAB — CBC WITH DIFFERENTIAL/PLATELET
Basophils Absolute: 0 x10E3/uL (ref 0.0–0.2)
Basos: 0 %
EOS (ABSOLUTE): 0.3 x10E3/uL (ref 0.0–0.4)
Eos: 2 %
Hematocrit: 47.2 % — ABNORMAL HIGH (ref 34.0–46.6)
Hemoglobin: 15.4 g/dL (ref 11.1–15.9)
Immature Grans (Abs): 0.1 x10E3/uL (ref 0.0–0.1)
Immature Granulocytes: 1 %
Lymphocytes Absolute: 3.3 x10E3/uL — ABNORMAL HIGH (ref 0.7–3.1)
Lymphs: 26 %
MCH: 29.9 pg (ref 26.6–33.0)
MCHC: 32.6 g/dL (ref 31.5–35.7)
MCV: 92 fL (ref 79–97)
Monocytes Absolute: 1.3 x10E3/uL — ABNORMAL HIGH (ref 0.1–0.9)
Monocytes: 10 %
Neutrophils Absolute: 7.7 x10E3/uL — ABNORMAL HIGH (ref 1.4–7.0)
Neutrophils: 61 %
Platelets: 310 x10E3/uL (ref 150–450)
RBC: 5.15 x10E6/uL (ref 3.77–5.28)
RDW: 12.8 % (ref 11.7–15.4)
WBC: 12.6 x10E3/uL — ABNORMAL HIGH (ref 3.4–10.8)

## 2024-03-18 LAB — BASIC METABOLIC PANEL WITH GFR
BUN/Creatinine Ratio: 10 — ABNORMAL LOW (ref 12–28)
BUN: 10 mg/dL (ref 8–27)
CO2: 23 mmol/L (ref 20–29)
Calcium: 9.8 mg/dL (ref 8.7–10.3)
Chloride: 100 mmol/L (ref 96–106)
Creatinine, Ser: 0.97 mg/dL (ref 0.57–1.00)
Glucose: 108 mg/dL — ABNORMAL HIGH (ref 70–99)
Potassium: 4.3 mmol/L (ref 3.5–5.2)
Sodium: 138 mmol/L (ref 134–144)
eGFR: 65 mL/min/1.73

## 2024-03-20 ENCOUNTER — Ambulatory Visit: Payer: Self-pay | Admitting: Family Medicine

## 2024-03-20 ENCOUNTER — Other Ambulatory Visit: Payer: Self-pay | Admitting: Family Medicine

## 2024-03-20 DIAGNOSIS — Z1231 Encounter for screening mammogram for malignant neoplasm of breast: Secondary | ICD-10-CM

## 2024-03-28 ENCOUNTER — Other Ambulatory Visit: Payer: Self-pay | Admitting: Family Medicine

## 2024-03-28 ENCOUNTER — Encounter

## 2024-03-28 DIAGNOSIS — G8929 Other chronic pain: Secondary | ICD-10-CM

## 2024-03-28 MED ORDER — TRAMADOL HCL 50 MG PO TABS: 50.0000 mg | ORAL_TABLET | Freq: Three times a day (TID) | ORAL | 0 refills | Status: AC | PRN

## 2024-03-31 ENCOUNTER — Other Ambulatory Visit: Payer: Self-pay

## 2024-03-31 NOTE — Progress Notes (Signed)
 Specialty Pharmacy Refill Coordination Note  Courtney Hamilton is a 64 y.o. female contacted today regarding refills of specialty medication(s) Dolutegravir  Sodium (Tivicay ); Emtricitabine -Tenofovir  AF (Descovy )   Patient requested Delivery   Delivery date: 04/06/24   Verified address: [address] [place] [address]   Medication will be filled on: 04/05/24

## 2024-03-31 NOTE — Progress Notes (Signed)
 Specialty Pharmacy Ongoing Clinical Assessment Note  Courtney Hamilton is a 64 y.o. female who is being followed by the specialty pharmacy service for RxSp HIV   Patient's specialty medication(s) reviewed today: Dolutegravir  Sodium (Tivicay ); Emtricitabine -Tenofovir  AF (Descovy )   Missed doses in the last 4 weeks: 0   Patient/Caregiver did not have any additional questions or concerns.   Therapeutic benefit summary: Patient is achieving benefit   Adverse events/side effects summary: No adverse events/side effects   Patient's therapy is appropriate to: Continue    Goals Addressed             This Visit's Progress    Achieve Undetectable HIV Viral Load < 20   On track    Patient is on track. Patient will maintain adherence. Patient's viral load was undetectable as of 03/10/24      Comply with lab assessments   On track    Patient is on track. Patient will adhere to provider and/or lab appointments      Increase CD4 count until steady state   On track    Patient is on track. Patient will maintain adherence         Follow up: 12 months  [facility]

## 2024-04-05 ENCOUNTER — Other Ambulatory Visit: Payer: Self-pay

## 2024-08-31 ENCOUNTER — Ambulatory Visit: Admitting: Dermatology

## 2024-11-16 ENCOUNTER — Encounter
